# Patient Record
Sex: Female | Born: 1935 | Race: White | Hispanic: No | Smoking: Never smoker
Health system: Southern US, Community
[De-identification: ages and names within clinical notes are randomized; demographics above are authoritative.]

## PROBLEM LIST (undated history)

## (undated) DIAGNOSIS — K8020 Calculus of gallbladder without cholecystitis without obstruction: Secondary | ICD-10-CM

## (undated) DIAGNOSIS — K859 Acute pancreatitis without necrosis or infection, unspecified: Secondary | ICD-10-CM

## (undated) DIAGNOSIS — I495 Sick sinus syndrome: Secondary | ICD-10-CM

## (undated) DIAGNOSIS — J45909 Unspecified asthma, uncomplicated: Secondary | ICD-10-CM

## (undated) DIAGNOSIS — Z950 Presence of cardiac pacemaker: Secondary | ICD-10-CM

## (undated) DIAGNOSIS — Z860100 Personal history of colon polyps, unspecified: Secondary | ICD-10-CM

## (undated) DIAGNOSIS — R55 Syncope and collapse: Secondary | ICD-10-CM

## (undated) DIAGNOSIS — G8929 Other chronic pain: Secondary | ICD-10-CM

## (undated) DIAGNOSIS — I493 Ventricular premature depolarization: Secondary | ICD-10-CM

## (undated) DIAGNOSIS — M159 Polyosteoarthritis, unspecified: Secondary | ICD-10-CM

## (undated) DIAGNOSIS — E785 Hyperlipidemia, unspecified: Secondary | ICD-10-CM

## (undated) DIAGNOSIS — K529 Noninfective gastroenteritis and colitis, unspecified: Secondary | ICD-10-CM

## (undated) DIAGNOSIS — I10 Essential (primary) hypertension: Secondary | ICD-10-CM

## (undated) DIAGNOSIS — I951 Orthostatic hypotension: Secondary | ICD-10-CM

## (undated) DIAGNOSIS — Z7901 Long term (current) use of anticoagulants: Secondary | ICD-10-CM

## (undated) DIAGNOSIS — D509 Iron deficiency anemia, unspecified: Secondary | ICD-10-CM

## (undated) DIAGNOSIS — I4821 Permanent atrial fibrillation: Secondary | ICD-10-CM

## (undated) DIAGNOSIS — M545 Low back pain: Secondary | ICD-10-CM

## (undated) HISTORY — DX: Personal history of colonic polyps: Z86.010

## (undated) HISTORY — DX: Sick sinus syndrome: I49.5

## (undated) HISTORY — PX: APPENDECTOMY: SHX54

## (undated) HISTORY — DX: Long term (current) use of anticoagulants: Z79.01

## (undated) HISTORY — DX: Unspecified asthma, uncomplicated: J45.909

## (undated) HISTORY — DX: Hyperlipidemia, unspecified: E78.5

## (undated) HISTORY — PX: ABDOMINAL HYSTERECTOMY: SHX81

## (undated) HISTORY — DX: Orthostatic hypotension: I95.1

## (undated) HISTORY — PX: COLONOSCOPY: SHX174

## (undated) HISTORY — DX: Calculus of gallbladder without cholecystitis without obstruction: K80.20

## (undated) HISTORY — DX: Ventricular premature depolarization: I49.3

## (undated) HISTORY — DX: Iron deficiency anemia, unspecified: D50.9

## (undated) HISTORY — DX: Permanent atrial fibrillation: I48.21

## (undated) HISTORY — DX: Polyosteoarthritis, unspecified: M15.9

## (undated) HISTORY — DX: Syncope and collapse: R55

## (undated) HISTORY — DX: Essential (primary) hypertension: I10

## (undated) HISTORY — DX: Personal history of colon polyps, unspecified: Z86.0100

## (undated) SURGERY — Surgical Case
Anesthesia: *Unknown

---

## 1979-01-08 HISTORY — PX: VESICOVAGINAL FISTULA CLOSURE W/ TAH: SUR271

## 1997-08-30 ENCOUNTER — Inpatient Hospital Stay (HOSPITAL_COMMUNITY): Admission: EM | Admit: 1997-08-30 | Discharge: 1997-09-02 | Payer: Self-pay | Admitting: Emergency Medicine

## 1997-09-06 ENCOUNTER — Ambulatory Visit (HOSPITAL_COMMUNITY): Admission: RE | Admit: 1997-09-06 | Discharge: 1997-09-06 | Payer: Self-pay | Admitting: Cardiology

## 2003-05-15 ENCOUNTER — Emergency Department (HOSPITAL_COMMUNITY): Admission: EM | Admit: 2003-05-15 | Discharge: 2003-05-16 | Payer: Self-pay | Admitting: Emergency Medicine

## 2005-07-05 ENCOUNTER — Encounter: Admission: RE | Admit: 2005-07-05 | Discharge: 2005-07-05 | Payer: Self-pay | Admitting: Family Medicine

## 2006-01-07 DIAGNOSIS — G8929 Other chronic pain: Secondary | ICD-10-CM

## 2006-01-07 DIAGNOSIS — M545 Low back pain, unspecified: Secondary | ICD-10-CM

## 2006-01-07 HISTORY — DX: Low back pain, unspecified: M54.50

## 2006-01-07 HISTORY — DX: Other chronic pain: G89.29

## 2006-07-22 ENCOUNTER — Encounter: Admission: RE | Admit: 2006-07-22 | Discharge: 2006-07-22 | Payer: Self-pay | Admitting: Family Medicine

## 2006-08-11 ENCOUNTER — Ambulatory Visit (HOSPITAL_COMMUNITY): Admission: RE | Admit: 2006-08-11 | Discharge: 2006-08-12 | Payer: Self-pay | Admitting: Cardiology

## 2006-09-29 ENCOUNTER — Inpatient Hospital Stay (HOSPITAL_COMMUNITY): Admission: AD | Admit: 2006-09-29 | Discharge: 2006-10-01 | Payer: Self-pay | Admitting: Cardiology

## 2006-10-11 DIAGNOSIS — Z950 Presence of cardiac pacemaker: Secondary | ICD-10-CM

## 2006-10-11 HISTORY — DX: Presence of cardiac pacemaker: Z95.0

## 2006-10-11 HISTORY — PX: PACEMAKER INSERTION: SHX728

## 2007-01-08 HISTORY — PX: CARDIAC CATHETERIZATION: SHX172

## 2007-01-20 ENCOUNTER — Encounter: Admission: RE | Admit: 2007-01-20 | Discharge: 2007-01-20 | Payer: Self-pay | Admitting: Cardiology

## 2007-01-30 ENCOUNTER — Encounter: Payer: Self-pay | Admitting: Critical Care Medicine

## 2007-02-02 ENCOUNTER — Encounter: Payer: Self-pay | Admitting: Critical Care Medicine

## 2007-02-02 ENCOUNTER — Encounter: Admission: RE | Admit: 2007-02-02 | Discharge: 2007-02-02 | Payer: Self-pay | Admitting: Cardiology

## 2007-06-18 ENCOUNTER — Encounter: Payer: Self-pay | Admitting: Critical Care Medicine

## 2007-06-18 ENCOUNTER — Encounter: Admission: RE | Admit: 2007-06-18 | Discharge: 2007-06-18 | Payer: Self-pay | Admitting: Cardiology

## 2007-06-22 ENCOUNTER — Inpatient Hospital Stay (HOSPITAL_BASED_OUTPATIENT_CLINIC_OR_DEPARTMENT_OTHER): Admission: RE | Admit: 2007-06-22 | Discharge: 2007-06-22 | Payer: Self-pay | Admitting: Cardiology

## 2007-07-06 ENCOUNTER — Encounter: Payer: Self-pay | Admitting: Critical Care Medicine

## 2007-07-09 ENCOUNTER — Ambulatory Visit: Payer: Self-pay | Admitting: Critical Care Medicine

## 2007-07-09 ENCOUNTER — Ambulatory Visit: Payer: Self-pay | Admitting: Cardiology

## 2007-07-09 DIAGNOSIS — R55 Syncope and collapse: Secondary | ICD-10-CM | POA: Insufficient documentation

## 2007-07-09 DIAGNOSIS — I495 Sick sinus syndrome: Secondary | ICD-10-CM | POA: Insufficient documentation

## 2007-07-09 DIAGNOSIS — E785 Hyperlipidemia, unspecified: Secondary | ICD-10-CM | POA: Insufficient documentation

## 2007-07-09 DIAGNOSIS — I482 Chronic atrial fibrillation, unspecified: Secondary | ICD-10-CM | POA: Insufficient documentation

## 2007-07-09 DIAGNOSIS — I10 Essential (primary) hypertension: Secondary | ICD-10-CM | POA: Insufficient documentation

## 2007-07-12 ENCOUNTER — Encounter: Payer: Self-pay | Admitting: Critical Care Medicine

## 2007-07-12 LAB — CONVERTED CEMR LAB
GFR calc Af Amer: 69 mL/min
Glucose, Bld: 120 mg/dL — ABNORMAL HIGH (ref 70–99)
Potassium: 4.5 meq/L (ref 3.5–5.1)
Sodium: 141 meq/L (ref 135–145)

## 2007-07-16 ENCOUNTER — Ambulatory Visit: Admission: RE | Admit: 2007-07-16 | Discharge: 2007-07-16 | Payer: Self-pay | Admitting: Critical Care Medicine

## 2007-07-16 ENCOUNTER — Encounter: Payer: Self-pay | Admitting: Critical Care Medicine

## 2007-07-17 ENCOUNTER — Encounter: Payer: Self-pay | Admitting: Critical Care Medicine

## 2007-07-20 ENCOUNTER — Telehealth (INDEPENDENT_AMBULATORY_CARE_PROVIDER_SITE_OTHER): Payer: Self-pay | Admitting: *Deleted

## 2007-08-19 ENCOUNTER — Ambulatory Visit: Payer: Self-pay | Admitting: Critical Care Medicine

## 2007-09-17 ENCOUNTER — Encounter: Payer: Self-pay | Admitting: Critical Care Medicine

## 2007-09-18 ENCOUNTER — Encounter: Payer: Self-pay | Admitting: Critical Care Medicine

## 2007-10-09 ENCOUNTER — Ambulatory Visit: Payer: Self-pay | Admitting: Critical Care Medicine

## 2007-12-28 ENCOUNTER — Ambulatory Visit (HOSPITAL_COMMUNITY): Admission: RE | Admit: 2007-12-28 | Discharge: 2007-12-28 | Payer: Self-pay | Admitting: Orthopedic Surgery

## 2008-01-02 ENCOUNTER — Ambulatory Visit: Payer: Self-pay | Admitting: Vascular Surgery

## 2008-01-02 ENCOUNTER — Encounter (INDEPENDENT_AMBULATORY_CARE_PROVIDER_SITE_OTHER): Payer: Self-pay | Admitting: Emergency Medicine

## 2008-01-02 ENCOUNTER — Inpatient Hospital Stay (HOSPITAL_COMMUNITY): Admission: EM | Admit: 2008-01-02 | Discharge: 2008-01-08 | Payer: Self-pay | Admitting: Emergency Medicine

## 2008-01-06 ENCOUNTER — Encounter (INDEPENDENT_AMBULATORY_CARE_PROVIDER_SITE_OTHER): Payer: Self-pay | Admitting: Interventional Radiology

## 2008-01-26 ENCOUNTER — Encounter: Payer: Self-pay | Admitting: Interventional Radiology

## 2008-01-27 ENCOUNTER — Ambulatory Visit (HOSPITAL_COMMUNITY): Admission: RE | Admit: 2008-01-27 | Discharge: 2008-01-27 | Payer: Self-pay | Admitting: Interventional Radiology

## 2008-01-29 ENCOUNTER — Ambulatory Visit (HOSPITAL_COMMUNITY): Admission: RE | Admit: 2008-01-29 | Discharge: 2008-01-29 | Payer: Self-pay | Admitting: Interventional Radiology

## 2008-02-19 ENCOUNTER — Ambulatory Visit (HOSPITAL_COMMUNITY): Admission: RE | Admit: 2008-02-19 | Discharge: 2008-02-19 | Payer: Self-pay | Admitting: Cardiology

## 2008-04-25 ENCOUNTER — Ambulatory Visit: Payer: Self-pay | Admitting: Critical Care Medicine

## 2008-06-13 ENCOUNTER — Ambulatory Visit: Payer: Self-pay | Admitting: Critical Care Medicine

## 2008-08-15 ENCOUNTER — Encounter: Admission: RE | Admit: 2008-08-15 | Discharge: 2008-08-15 | Payer: Self-pay | Admitting: Specialist

## 2009-02-23 ENCOUNTER — Telehealth: Payer: Self-pay | Admitting: Critical Care Medicine

## 2009-02-24 ENCOUNTER — Ambulatory Visit: Payer: Self-pay | Admitting: Critical Care Medicine

## 2009-02-27 DIAGNOSIS — J45909 Unspecified asthma, uncomplicated: Secondary | ICD-10-CM | POA: Insufficient documentation

## 2009-02-28 ENCOUNTER — Encounter: Payer: Self-pay | Admitting: Critical Care Medicine

## 2009-03-02 ENCOUNTER — Telehealth (INDEPENDENT_AMBULATORY_CARE_PROVIDER_SITE_OTHER): Payer: Self-pay | Admitting: *Deleted

## 2009-04-18 ENCOUNTER — Encounter: Admission: RE | Admit: 2009-04-18 | Discharge: 2009-04-18 | Payer: Self-pay | Admitting: Gastroenterology

## 2009-05-08 ENCOUNTER — Ambulatory Visit (HOSPITAL_COMMUNITY): Admission: RE | Admit: 2009-05-08 | Discharge: 2009-05-08 | Payer: Self-pay | Admitting: Gastroenterology

## 2009-10-30 ENCOUNTER — Encounter: Admission: RE | Admit: 2009-10-30 | Discharge: 2009-10-30 | Payer: Self-pay | Admitting: Orthopaedic Surgery

## 2010-01-28 ENCOUNTER — Encounter: Payer: Self-pay | Admitting: Orthopedic Surgery

## 2010-01-28 ENCOUNTER — Encounter: Payer: Self-pay | Admitting: Gastroenterology

## 2010-02-06 NOTE — Medication Information (Signed)
Summary: Clarification for Serevent Diskus/[facility]  Clarification for Serevent [facility]   Imported By: Sherian Rein 03/08/2009 11:12:09  _____________________________________________________________________  External Attachment:    Type:   Image     Comment:   External Document

## 2010-02-06 NOTE — Progress Notes (Signed)
Summary: Foradil changed to Serevent  Phone Note From Pharmacy Call back at [phone]   Caller: Patient Caller: [facility]  Call For: WRIGHT  Summary of Call: [facility] NOT AVAILABLE NEED ALTERNATIVE INVOICE # IS [id number] Initial call taken by: Rickard Patience,  March 02, 2009 3:28 PM  Follow-up for Phone Call        foradil is on backorder indefinetly please advise of alternative medication .Chantel Bowne  March 02, 2009 3:48 PM  Additional Follow-up for Phone Call Additional follow up Details #1::        start Serevent diskus  one puff two times a day  Additional Follow-up by: Storm Frisk MD,  March 02, 2009 5:51 PM    Additional Follow-up for Phone Call Additional follow up Details #2::    Called [facility] and gave verbal okay to change Foradil to Serevent. I also called the patient and she is aware of the change and that [facility] will be shipping her new medication to her. She will call if she has any problems or questions regarding the Serevent. Foradil changed to Serevent on pt's medication list. Follow-up by: Michel Bickers CMA,  March 03, 2009 9:08 AM  New/Updated Medications: SEREVENT DISKUS 50 MCG/DOSE AEPB (SALMETEROL XINAFOATE) 1 puff two times a day Prescriptions: SEREVENT DISKUS 50 MCG/DOSE AEPB (SALMETEROL XINAFOATE) 1 puff two times a day  #3 x 3   Entered by:   Michel Bickers CMA   Authorized by:   Storm Frisk MD   Signed by:   Michel Bickers CMA on 03/03/2009   Method used:   Telephoned to ...       [facility]* (mail-order)             ,          Ph: [phone]       Fax: [phone]   RxID:   [id number]

## 2010-02-06 NOTE — Progress Notes (Signed)
Summary: refills/ [facility]  Phone Note Call from Patient Call back at Home Phone [phone]   Caller: Patient Call For: wright Summary of Call: pt wants FORADIL and QVAR refilled through [facility].  Initial call taken by: Tivis Ringer, CNA,  February 23, 2009 11:50 AM  Follow-up for Phone Call        advised pt she was overdue for follow-up, so I schedueld her to see PW at 9:50 tomorrow. Sent in refills. Carron Curie CMA  February 23, 2009 12:00 PM     Prescriptions: QVAR 80 MCG/ACT  AERS (BECLOMETHASONE DIPROPIONATE) Two  puffs twice daily Brand medically necessary #3 x 3   Entered by:   Carron Curie CMA   Authorized by:   Storm Frisk MD   Signed by:   Carron Curie CMA on 02/23/2009   Method used:   Faxed to ...       [facility]* (mail-order)             ,          Ph: [phone]       Fax: [phone]   RxID:   [id number] FORADIL AEROLIZER 12 MCG  CAPS (FORMOTEROL FUMARATE) One  capsule (1 puff) twice daily Brand medically necessary #180 x 3   Entered by:   Carron Curie CMA   Authorized by:   Storm Frisk MD   Signed by:   Carron Curie CMA on 02/23/2009   Method used:   Faxed to ...       [facility]* (mail-order)             ,          Ph: [phone]       Fax: [phone]   RxID:   [id number]

## 2010-02-06 NOTE — Assessment & Plan Note (Signed)
Summary: Pulmonary OV   Copy to:  Dr. Rolin Barry Primary Provider/Referring Provider:  Arvilla Market  CC:  Followup asthma.  Pt states that her breathing has improved.  Pt c/o "empty feeling" in chest on the left side- bothers her in the am "not painful".  No other complaints today.Anna Farrell  History of Present Illness: Pulmonary OV: 77 yo WF with moderate persistent asthma   February 24, 2009 9:53 AM Dyspnea is better.  No real cough.  No wheeze .  Is still taking symbicort and foradil and qvar!! Pt denies any significant sore throat, nasal congestion or excess secretions, fever, chills, sweats, unintended weight loss, pleurtic or exertional chest pain, orthopnea PND, or leg swelling Pt denies any increase in rescue therapy over baseline, denies waking up needing it or having any early am or nocturnal exacerbations of coughing/wheezing/or dyspnea. February 24, 2009 9:53 AM Dyspnea is better.  No real cough.  No wheeze .  Is still taking symbicort and foradil and qvar!!  Asthma History    Initial Asthma Severity Rating:    Age range: 12+ years    Symptoms: 0-2 days/week    Nighttime Awakenings: 0-2/month    Interferes w/ normal activity: no limitations    SABA use (not for EIB): 0-2 days/week    Exacerbations requiring oral systemic steroids: 0-1/year    Asthma Severity Assessment: Intermittent   Preventive Screening-Counseling & Management  Alcohol-Tobacco     Smoking Status: never  Current Medications (verified): 1)  Crestor 5 Mg  Tabs (Rosuvastatin Calcium) .Anna Farrell.. 1 By Mouth Daily 2)  Toprol Xl 50 Mg  Tb24 (Metoprolol Succinate) .Anna Farrell.. 1 By Mouth Daily 3)  Coumadin 3 Mg  Tabs (Warfarin Sodium) .... Take As Directed 4)  Amiodarone Hcl 200 Mg  Tabs (Amiodarone Hcl) .Anna Farrell.. 1 1/2 Once Daily 5)  Tylenol Extra Strength 500 Mg  Tabs (Acetaminophen) .... As Needed 6)  Vitamin D 1000 Unit  Tabs (Cholecalciferol) .Anna Farrell.. 1 By Mouth Daily 7)  Foradil Aerolizer 12 Mcg  Caps (Formoterol Fumarate) ....  One  Capsule (1 Puff) Twice Daily 8)  Singulair 10 Mg Tabs (Montelukast Sodium) .Anna Farrell.. 1 By Mouth Daily 9)  Qvar 80 Mcg/act  Aers (Beclomethasone Dipropionate) .... Two  Puffs Twice Daily  Allergies (verified): 1)  ! Codeine  Past History:  Past medical, surgical, family and social histories (including risk factors) reviewed, and no changes noted (except as noted below).  Past Medical History: Reviewed history from 10/09/2007 and no changes required. Current Problems:  BRADYCARDIA-TACHYCARDIA SYNDROME (ICD-427.81) PPM placed 8/08 ATRIAL FIBRILLATION (ICD-427.31) VASOVAGAL SYNCOPE (ICD-780.2) HYPERTENSION (ICD-401.9) DYSLIPIDEMIA (ICD-272.4) orhostatic hypotension Asthma 2009     -FEV1     Value: 1.42 L     Pred: 1.87 L     % Pred: 76 %->75%    FEF 25-75   Value: 0.68 L/min   Pred: 1.64 L/min     % Pred: 42 %-->35%  Past Surgical History: Reviewed history from 07/09/2007 and no changes required. Hysterectomy in 1981 Pacemaker insertion on 10/11/06  Past Pulmonary History:  Pulmonary History: Asthma PFTs:  2009     -FEV1     Value: 1.42 L     Pred: 1.87 L     % Pred: 76 %->75%    FEF 25-75   Value: 0.68 L/min   Pred: 1.64 L/min     % Pred: 42 %-->35%  Family History: Reviewed history from 07/09/2007 and no changes required. non contrib  Social History: Reviewed history from 07/09/2007 and  no changes required. Patient never smoked.  waitress at [employer]  Review of Systems       The patient complains of shortness of breath with activity.  The patient denies shortness of breath at rest, productive cough, non-productive cough, coughing up blood, chest pain, irregular heartbeats, acid heartburn, indigestion, loss of appetite, weight change, abdominal pain, difficulty swallowing, sore throat, tooth/dental problems, headaches, nasal congestion/difficulty breathing through nose, sneezing, itching, ear ache, anxiety, depression, hand/feet swelling, joint stiffness or pain, rash,  change in color of mucus, and fever.    Vital Signs:  Patient profile:   77 year old female Weight:      154 pounds BMI:     28.27 O2 Sat:      97 % on Room air Temp:     98.0 degrees F oral Pulse rate:   69 / minute BP sitting:   150 / 96  (left arm)  Vitals Entered By: Vernie Murders (February 24, 2009 9:49 AM)  O2 Flow:  Room air  Physical Exam  Additional Exam:  Gen: Pleasant, well-nourished, in no distress , normal affect ENT: no lesions, no post nasal drip Neck: No JVD, no TMG, no carotid bruits Lungs: No use of accessory muscles, no dullness to percussion, distant BS,  poor airflow Cardiovascular: RRR, heart sounds normal, no murmurs or gallops, no peripheral edema Abdomen: soft and non-tender, no HSM, BS normal Musculoskeletal: No deformities, no cyanosis or clubbing Neuro: alert, non-focal     Impression & Recommendations:  Problem # 1:  EXTRINSIC ASTHMA, UNSPECIFIED (ICD-493.00) Assessment Unchanged stable moderate persistent asthma plan Stop symbicort Stay on Foradil on capsule twice daily Stay on Qvar two puffs twice a day No changes otherwise Return in 4 months  Medications Added to Medication List This Visit: 1)  Amiodarone Hcl 200 Mg Tabs (Amiodarone hcl) .Anna Farrell.. 1 1/2 once daily  Complete Medication List: 1)  Crestor 5 Mg Tabs (Rosuvastatin calcium) .Anna Farrell.. 1 by mouth daily 2)  Toprol Xl 50 Mg Tb24 (Metoprolol succinate) .Anna Farrell.. 1 by mouth daily 3)  Coumadin 3 Mg Tabs (Warfarin sodium) .... Take as directed 4)  Amiodarone Hcl 200 Mg Tabs (Amiodarone hcl) .Anna Farrell.. 1 1/2 once daily 5)  Tylenol Extra Strength 500 Mg Tabs (Acetaminophen) .... As needed 6)  Vitamin D 1000 Unit Tabs (Cholecalciferol) .Anna Farrell.. 1 by mouth daily 7)  Foradil Aerolizer 12 Mcg Caps (Formoterol fumarate) .... One  capsule (1 puff) twice daily 8)  Singulair 10 Mg Tabs (Montelukast sodium) .Anna Farrell.. 1 by mouth daily 9)  Qvar 80 Mcg/act Aers (Beclomethasone dipropionate) .... Two  puffs twice  daily  Other Orders: Est. Patient Level III (16109)  Patient Instructions: 1)  Stop symbicort 2)  Stay on Foradil on capsule twice daily 3)  Stay on Qvar two puffs twice a day 4)  No changes otherwise 5)  Return in 4 months

## 2010-04-17 ENCOUNTER — Other Ambulatory Visit: Payer: Self-pay | Admitting: Critical Care Medicine

## 2010-04-17 DIAGNOSIS — J449 Chronic obstructive pulmonary disease, unspecified: Secondary | ICD-10-CM

## 2010-04-17 NOTE — Telephone Encounter (Signed)
i approved the refill x one month,  The pt needs to make an appt for f/u OV

## 2010-04-23 LAB — COMPREHENSIVE METABOLIC PANEL
ALT: 28 U/L (ref 0–35)
Albumin: 2.5 g/dL — ABNORMAL LOW (ref 3.5–5.2)
Alkaline Phosphatase: 91 U/L (ref 39–117)
Chloride: 105 mEq/L (ref 96–112)
Glucose, Bld: 102 mg/dL — ABNORMAL HIGH (ref 70–99)
Potassium: 4 mEq/L (ref 3.5–5.1)
Sodium: 140 mEq/L (ref 135–145)
Total Bilirubin: 0.6 mg/dL (ref 0.3–1.2)
Total Protein: 5.4 g/dL — ABNORMAL LOW (ref 6.0–8.3)

## 2010-04-23 LAB — CBC
HCT: 35.2 % — ABNORMAL LOW (ref 36.0–46.0)
Hemoglobin: 11.6 g/dL — ABNORMAL LOW (ref 12.0–15.0)
Platelets: 237 10*3/uL (ref 150–400)
RDW: 15.2 % (ref 11.5–15.5)
WBC: 4.8 10*3/uL (ref 4.0–10.5)

## 2010-05-22 NOTE — Discharge Summary (Signed)
[name], [name]                 ACCOUNT NO.:  [id number]   MEDICAL RECORD NO.:  [id number]          PATIENT TYPE:  OIB   LOCATION:  6522                         FACILITY:  [facility]   PHYSICIAN:  Francisca December, M.D.  DATE OF BIRTH:  [birth year]   DATE OF ADMISSION:  08/11/2006  DATE OF DISCHARGE:  08/12/2006                               DISCHARGE SUMMARY   DISCHARGE DIAGNOSES:  1. Tachybrady syndrome status post dual-chamber pacemaker, St. Jude      type, on August 11, 2006.  2. Atrial fibrillation.  3. Dyslipidemia.  4. Hypertension.  5. History of vasovagal syncope.  6. Long-term medication use.  7. Coumadin use.   Anna Farrell is a 77 year old female who has a history of atrial  fibrillation as well as sinus bradycardia despite now being on beta  blockers.  Ultimately, she required permanent problem for this  tachybrady syndrome.  On August 11, 2006, she was brought into the  hospital, underwent a dual-chamber St. Jude pacemaker.  She tolerated  the procedure well and remained in the hospital overnight and was ready  for discharge home the following day.   Please note that a recent stress Cardiolite was performed, which showed  no evidence of underlying inducible ischemia.   DISCHARGE MEDICATIONS:  1. Avalide 300/25 mg 1 tablet daily.  2. Crestor 5 mg daily.  3. Allegra 180 mg p.r.n.  4. Excedrin as needed.  5. Vicodin 1-2 tablets every 6 hours as needed for pain.  6. Coumadin 3 mg 1 twice daily.  7. Toprol 100 mg 1/2 tablet daily.   Clean site gently with soap and water, no scrubbing.  Instruction sheet  regarding activity and wound care was given to the patient.  She is to  remain on a low sodium, heart-healthy diet.      Anna Farrell, P.A.      Francisca December, M.D.  Electronically Signed    LB/MEDQ  D:  08/12/2006  T:  08/12/2006  Job:  [id number]   cc:   Dr. Threasa Heads, M.D.

## 2010-05-22 NOTE — Op Note (Signed)
[name], [name]                 ACCOUNT NO.:  [id number]   MEDICAL RECORD NO.:  [id number]          PATIENT TYPE:  OIB   LOCATION:  2854                         FACILITY:  [facility]   PHYSICIAN:  Francisca December, M.D.  DATE OF BIRTH:  18-Apr-1927   DATE OF PROCEDURE:  08/11/2006  DATE OF DISCHARGE:                               OPERATIVE REPORT   PROCEDURES PERFORMED:  1. Insertion dual-chamber permanent transvenous pacemaker.  2. Left subclavian venogram.   INDICATION:  Anna Farrell is a 77 year old woman with paroxysmal atrial  fibrillation and apparent tachybrady syndrome.  A recent Holter monitor  revealed 4.1-second pauses.  She is brought to catheterization  laboratory at this time for implantation of dual-chamber permanent  transvenous pacemaker.   PROCEDURE NOTE:  The patient is brought to cardiac catheterization  laboratory in fasting state.  The left prepectoral region was prepped  and draped in the usual sterile fashion.  Local anesthesia was obtained  with infiltration of 1% lidocaine.  A 6-7 cm incision was then made in  the deltopectoral groove and this was carried down by sharp dissection  and electrocautery to the prepectoral fascia.  There a plane was lifted  and a pocket formed inferiorly and medially.  It was then packed with 1%  kanamycin soaked gauze.   A left subclavian venogram was then performed with a peripheral  injection of 20 mL 20 mL of Omnipaque.  A digital cine angiogram was  obtained and road mapped to guide future left subclavian puncture.  The venogram did demonstrate the vein to be widely patent and coursing  in a normal fashion over the anterior surface of the first rib and  beneath the middle third of the clavicle.  There was no evidence for  persistence of the left superior vena cava.   Two separate left subclavian punctures were then performed with an 18-  gauge thin-wall needle through which a 0.038 inches guidewire.  Over the  initial  guidewire a 7-French tearaway sheath and dilator were advanced.  The dilator and wire were removed and the ventricular lead was advanced  to the level of the right atrium.  The sheath was torn away.  Using  standard technique and fluoroscopic landmarks the lead was manipulated  into the right ventricular apex.  This was an active fixation lead and  the screw was advanced as appropriate.  It was tested for adequate  pacing parameters and these are reported below.  It was tested for  diaphragmatic pacing at 10 volts and none was found.  The lead was then  sutured into place using three separate silk ligatures.  Over the  remaining guidewire a second 7-French tearaway sheath and dilator were  advanced.  Again the dilator and wire were removed and the atrial lead  was advanced to the level of the right atrium.  The sheath was torn  away.  Again using standard technique and fluoroscopic landmarks the  lead was manipulated into the right atrial appendage.  This again was an  active fixation lead and the screw was advanced as appropriate.  It was  tested for adequate pacing parameters and these are reported below.  Was  also tested for diaphragmatic pacing at 10 volts and none was found.  The lead was then sutured into place using three separate 0 silk  ligatures.  The kanamycin soaked gauze was then removed from the pocket.  The pocket was copiously irrigated using 1% kanamycin solution.  The  leads were then attached to the pacing generator carefully identifying  each by its serial number placing each into the appropriate receptacle.  Each lead was tightened into place and tested for security.  The leads  then wound beneath the pacing generator and the generator was placed in  the pocket.  A 0 silk anchoring suture was applied.  The subcutaneous  layer was then approximated using 2-0 Vicryl in a running fashion.  The  skin was approximated using 4-0 Vicryl in a running subcuticular  fashion.   Steri-Strips and sterile dressing were applied.  The patient  is transported to recovery area in stable condition.   EQUIPMENT DATA:  The pacing generator is a St. Jude Zephyr XL DR model  number [id number], serial number [id number].  The atrial lead is a St. Jude model  number a [id number] serial number [id number].  The ventricular lead is a  Medtronic model number [id number] serial number [id number].   PACING DATA:  The atrial lead detected a 1.7 mV P-wave.  The pacing  threshold was 0.8 volts at 0.5 milliseconds pulse width.  The impedance  was 610 ohms resulting in a current at capture threshold of 1.8 MA.  The  ventricular lead detected 8.4 mV R wave.  The pacing threshold was 0.5  volts at 0.5 milliseconds pulse width.  The impedance was 563 ohms  resulting in a current at capture threshold of 0.8 MA.      Francisca December, M.D.  Electronically Signed     JHE/MEDQ  D:  08/11/2006  T:  08/12/2006  Job:  [id number]

## 2010-05-22 NOTE — H&P (Signed)
NAMETRENIECE, HOLSCLAW NO.:  [id number]   MEDICAL RECORD NO.:  [id number]          PATIENT TYPE:  INP   LOCATION:  1824                         FACILITY:  [facility]   PHYSICIAN:  Ramiro Harvest, MD    DATE OF BIRTH:  01-17-1927   DATE OF ADMISSION:  01/02/2008  DATE OF DISCHARGE:                              HISTORY & PHYSICAL   PRIMARY CARE PHYSICIAN:  Donia Guiles, MD, of [facility]   ORTHOPEDIST:  Lenard Galloway. Chaney Malling, MD   CARDIOLOGIST:  Armanda Magic, MD, of [facility]   HISTORY OF PRESENT ILLNESS:  Anna Farrell is a 77 year old white female  with history of atrial fibrillation, tachybrady syndrome, status post  pacemaker, hypertension, hyperlipidemia who presented to the ED with  intractable lower back pain and inability to move per patient and  family.  Per patient and family, patient fell on the ice on Monday,  December 28, 2007, went to see orthopedist who got an x-ray, and it was  found patient had a compression fracture of L1.  A CT scan was done  which was seen consistent with a compression fracture.  Patient was  given some pain medications and told to get some bed rest at her home.  Patient was scheduled per orthopedist per family to get a bone scan done  on January 05, 2008, as well as a referral to interventional radiology.  Patient, however, stated that she woke up on Thursday, December 31, 2007, to go to the restroom around 4:30 a.m. and at 6:30 a.m. was in  such intractable pain that she was unable to move and called her family.  The patient has had some decreased p.o. intake as well.  Patient has  some generalized weakness, suprapubic pain as well.  Patient denied any  fever, no chills, no emesis, no diarrhea, no constipation, no chest  pain, some shortness of breath which has since resolved.  Patient denies  any urinary or bowel incontinence, no focal neurological symptoms, no  tingling, no numbness.  Patient does endorse bilateral  lower back pain.  Doppler ultrasounds done were negative.  INR obtained was 5.  Ortho was  called and stated would consult on the patient, and we were called to  admit the patient for further evaluation and management.   ALLERGIES:  No known drug allergies.   PAST MEDICAL HISTORY:  1. Tachybrady syndrome, status post dual-chamber pacemaker, St. Jude      type, August 11, 2006, per Dr. Amil Amen.  2. Atrial fibrillation.  3. Dyslipidemia.  4. Hypertension.  5. History of vasovagal syncope.  6. Coumadin use.  7. Mild diastolic dysfunction.  8. History of asthma.   HOME MEDICATIONS:  1. Crestor 5 mg p.o. daily.  2. Toprol-XL 50 mg p.o. daily.  3. Amiodarone 200 mg p.o. daily.  4. Coumadin 3 mg daily except for Mondays when she takes 1.5 mg.  5. Hydrocodone p.r.n.  6. Symbicort 2 puffs b.i.d.  7. Astepro nasal spray as needed.  8. Tylenol as needed.  9. Robaxin.   SOCIAL HISTORY:  The patient lives in  [place], is widowed, no tobacco  use, no alcohol use, no IV drug use.   FAMILY HISTORY:  Noncontributory.   REVIEW OF SYSTEMS:  As per HPI, otherwise negative.   PHYSICAL EXAMINATION:  VITAL SIGNS:  Temperature 99.9, blood pressure  113/73, pulse of 80, respiratory rate 20, saturating 93% on room air.  GENERAL:  Patient is lying on bed in moderate distress.  HEENT:  Normocephalic, atraumatic.  Pupils are equal, round, reactive to  light and accommodation.  Extraocular movements are intact.  Oropharynx  is clear, no lesions, no exudates, dry mucous membranes.  NECK:  Supple, no lymphadenopathy.  RESPIRATORY:  Lungs are clear to auscultation bilaterally in anterior  lung fields.  CARDIOVASCULAR:  Regular rate and rhythm, no murmurs, rubs, or gallops.  ABDOMEN:  Soft, nondistended, positive suprapubic tenderness to  palpation, positive bowel sounds.  EXTREMITIES:  No clubbing, cyanosis or edema.  NEUROLOGICAL:  Patient is alert and oriented x3.  Cranial nerves II-XII  are  grossly intact.  No focal deficits.  Unable to elicit reflexes  diffusely.  Sensation is intact.  Cerebellum is intact.  There is 3/5 to  4/5 bilateral lower extremity strength, 5/5 bilateral upper extremity  strength.   LABORATORIES:  PT of 50.8, INR 5.  X-ray of bilateral hips was normal.  CT scan of the L spine shows superior end plate compression fracture  with fracture line extending to the posterior wall of the vertebral  body.  Approximately 25% loss of height and minimal retropulsion of the  bone without significant stenosis.  Mild x-ray convex curvature of the  lumbar spine at multilevels, spondylosis in the lower lumbar spine per  CT scan of December 28, 2007.   ASSESSMENT/PLAN:  Ms. Anna Farrell is a 77 year old female with history  of tachybrady syndrome, status post pacemaker, history of atrial  fibrillation who fell on the ice on Monday, December 28, 2007,  sustaining a compression fracture coming in with intractable pain and  inability to move.  1. L1 compression fracture secondary to mechanical fall.  Patient with      known neurological deficits; patient in intractable pain, unable to      move secondary to this.  Admit to a regular bed.  Tylenol 650 mg      p.o. q.8 h., oxycodone 5 mg p.o. q.4 h. as needed for breakthrough      pain, morphine also as needed for breakthrough pain; place on      intravenous fluids, symptomatic care.  Consult interventional      radiology for possible vertebroplasty.  Discussed that with Dr.      Corliss Skains.  2. Supratherapeutic INR.  No evidence of overt bleeding.  Will hold      patient's Coumadin for now and monitor INR.  3. Dehydration.  Place on intravenous fluids.  4. Suprapubic pain, likely secondary to a urinary tract infection.  We      will check a UA with cultures and sensitivities, check a      comprehensive metabolic profile, check CBC with differential, check      a magnesium level, symptomatic care.  5. Atrial fibrillation  with rapid ventricular response.  Amiodarone      and Toprol for rate control.  Patient with a supratherapeutic INR;      hold Coumadin.  6. Hypertension.  Toprol-XL.  7. Hyperlipidemia.  Crestor.  8. Prophylaxis.  Protonix for gastrointestinal prophylaxis.  Coumadin      for deep venous thrombosis  prophylaxis.  However, patient with a      supratherapeutic INR, will monitor for now.   It has been a pleasure taking care of Ms. Anna Farrell.      Ramiro Harvest, MD  Electronically Signed     DT/MEDQ  D:  01/02/2008  T:  01/02/2008  Job:  [id number]   cc:   Donia Guiles, M.D.  Rodney A. Chaney Malling, M.D.  Armanda Magic, M.D.

## 2010-05-22 NOTE — Discharge Summary (Signed)
[name], [name]                 ACCOUNT NO.:  [id number]   MEDICAL RECORD NO.:  [id number]          PATIENT TYPE:  INP   LOCATION:  3001                         FACILITY:  [facility]   PHYSICIAN:  Michiel Cowboy, MDDATE OF BIRTH:  1927-01-08   DATE OF ADMISSION:  01/02/2008  DATE OF DISCHARGE:  01/08/2008                               DISCHARGE SUMMARY   DISCHARGING PHYSICIAN:  Michiel Cowboy, MD   PRIMARY CARE PHYSICIAN:  Donia Guiles, MD   CARDIOLOGY:  Armanda Magic, MD   DISCHARGE DIAGNOSES:  1. L1 fracture, status post vertebroplasty.  2. Urinary tract infection.  3. Deconditioning.  4. Tachybrady syndrome, status post dual-chamber pacemaker, [employer]      type in 2008.  5. Atrial fibrillation.  6. Dyslipidemia.  7. Hypertension.  8. History of vasovagal syncope.  9. Coumadin use.  10.Mild diastolic dysfunction.  11.History of asthma.  12.Labile blood pressure.   CONSULTANTS:  Sanjeev K. Corliss Skains, M.D. of Interventional Radiology.   STUDIES:  The patient had a CT scan of the L-spine done on December 28, 2007, which showed status post bone biopsy of L1 and status post bone  kyphoplasty.  On January 02, 2008, she had bilateral hip x-ray, showed  normal hips.  On January 02, 2008, she had chest portable which showed  cardiomegaly and on January 06, 2008, she had undergone kyphoplasty by  Dr. Julieanne Cotton.   HOSPITAL COURSE:  Please see full details from H&P, but Anna Farrell is a 77-  year-old female who presented with chief complaint of intractable back  pain after a fall and inability to move secondary to severe pain.  The  patient had to be hospitalized for pain control.  Her Coumadin had to be  reversed initially when she presented.  Her INR was severely elevated at  5.  She actually received vitamin K after discussion with Dorene Sorrow  pharmacist from Dr. Norris Cross office who thought that this was  appropriate.  The patient then, once her INR became  subtherapeutic at  1.5, had undergone a kyphoplasty with excellent results at the time of  discharge.  The patient still had mild hip pain but her abdominal pain  has resolved.  She is actually able to ambulate, void, moving all 4  extremities, appears to be neurologically intact.  The patient is to be  discharged to home with home health PT/OT.  Concerning atrial  fibrillation, she remained stable.  The patient is to be discharged on  amiodarone and Toprol and continue her Coumadin at 3 mg daily and have  her INR checked on January 11, 2008.  The results sent to Jerrilyn Cairo in  Dr. Gloris Manchester Turner's office.   Hypertension.  During this hospitalization, the patient has very labile  blood pressure, going up to 190s when she was in pain and down to 90s,  otherwise when she had pain medication.  I decreased her Toprol dose to  25 mg p.o. daily and continued amiodarone.  The patient will need to  have blood pressure check when she presents to primary care Chancie Lampert.  Also, while in the hospital, the patient was noted to have urinary tract  infection, which was treated with Cipro with excellent result.  While in  the hospital, it was noted that the patient's B12 level was slightly  diminished.  We started her on B12.  Because the patient wason coumadin,  we chose not to give her shots but to have her  a pill.  We will need to  have her vitamin B12 checked in about 1 month.   DISCHARGE MEDICATIONS:  1. Continue Coumadin 3 mg p.o. daily, check INR on January 11, 2008.  2. Toprol 25 mg p.o. daily.  3. Crestor 5 mg p.o. daily.  4. Amiodarone 200 mg p.o. daily.  5. Symbicort twice daily.  6. Astepro nasal spray.  7. Vitamin D.  8. Robaxin 500 mg p.o. every 8 hours as needed.  9. Hydrocodone every 4 hours as needed for pain.  10.Phenergan as needed for nausea.  11.Vitamin B12 1000 mcg p.o. daily x3 days every week.  12.Colace 100 mg p.o. b.i.d.      Michiel Cowboy, MD  Electronically  Signed     AVD/MEDQ  D:  01/08/2008  T:  01/09/2008  Job:  [id number]   cc:   Armanda Magic, M.D.  Sanjeev K. Corliss Skains, M.D.  Donia Guiles, M.D.

## 2010-05-22 NOTE — Cardiovascular Report (Signed)
Anna Farrell, Anna Farrell                 ACCOUNT NO.:  [id number]   MEDICAL RECORD NO.:  [id number]          PATIENT TYPE:  OIB   LOCATION:  1965                         FACILITY:  [facility]   PHYSICIAN:  Armanda Magic, M.D.     DATE OF BIRTH:  03/18/1927   DATE OF PROCEDURE:  06/22/2007  DATE OF DISCHARGE:  06/22/2007                            CARDIAC CATHETERIZATION   REFERRING PHYSICIAN:  Donia Guiles, MD.   PROCEDURES:  1. Right and left heart catheterization.  2. Coronary angiography.  3. Left ventriculography.   OPERATOR:  Armanda Magic, MD   INDICATIONS:  Shortness of breath, rule out pulmonary hypertension and  coronary disease.   MEDICATIONS:  1. Versed 1 mg.  2. Fentanyl 25 mcg.  3. Zofran 4 mg IV.   COMPLICATIONS:  None.   IV ACCESS:  Via right femoral artery, 4-French sheath and right femoral  vein, 6-French sheath.   The patient was brought to the Cardiac Catheterization Laboratory in a  fasting nonsedated state.  Informed consent was obtained.  The patient  was connected to continuous heart rate and pulse oximetry monitoring and  intermittent blood pressure monitoring.  The right groin was prepped and  draped in sterile fashion.  Xylocaine 1% was used for local anesthesia.  Using the modified Seldinger technique, a 4-French sheath was placed in  the right femoral artery.  Under fluoroscopic guidance, a 4-French JL-4  catheter was placed in the vicinity of the coronary ostium of the left  coronary artery, but could not engage the ostium adequately.  The  catheter was exchanged out over a guidewire for a 4-French 3-DRC  catheter, which successfully engaged the right coronary ostium.  Multiple same films were taken in the 30-degree RAO and 40-degree LAO  views. This catheter was then exchanged out over a guidewire for a 4-  [place] JL-5 catheter, which successfully engaged the coronary ostium.  It appeared to be a dual ostium of the left circumflex and LAD and  multiple shots had to be taken to be able to visualize both coronary  arteries adequately.  The catheter was then removed over a guide wire  and a 4-French angled pigtail catheter was placed in left ventricular  cavity.  Left ventriculography was performed in the 30-degree RAO view  using a total of 30 mL of contrast and 15 mL per second.  Catheter was  then pulled back across the aortic valve with no significant gradient  noted.  Using modified Seldinger technique, a 7-French sheath was placed  in the right femoral vein.  Under fluoroscopic guidance, a Swan-Ganz  catheter was placed under balloon flotation into the right atrium.  Right atrial pressure and O2 saturation was obtained.  Also, aortic  saturation was obtained as well.  The catheter was then advanced in the  right ventricle and right ventricular pressure was measured.  The  catheter was advanced in the pulmonary artery and pulmonary arterial  pressure was measured as well as O2 saturations of the pulmonary artery.  Catheter was then allowed to migrate into the pulmonary capillary wedge  position and  pulmonary capillary wedge pressure was measured.  The  balloon was deflated and removed.  At the end of procedure, all  catheters and sheaths were removed.  Manual compression was performed  until adequate hemostasis was obtained.  The patient was transferred  back to her room in stable condition.   RESULTS:  1. Right heart cath data:  Right atrial pressure 7/5 with a mean of 4      mmHg.  RV pressure 27/3 with a mean of 9 mmHg.  PA pressure 26/10      with a mean of 17 mmHg.  Pulmonary capillary wedge 10/9 with a mean      of 7 mmHg.  Pulmonary artery O2 saturations 64%.  Right atrial O2      saturation 97%.  Aortic O2 saturation 93%.  Cardiac output by Fick      was 4.2.  Cardiac index by Fick 2.4.  LVEDP was an 17 mmHg.  LV      pressure 146/21 mmHg.  Aortic pressure 146/78 mmHg.   The left coronary artery had a dual ostium to  the left anterior  descending and left circumflex artery.  The left anterior descending  artery is widely patent throughout its course of the apex.  It gives  rise to a very large first diagonal branch, which bifurcates into 2  daughter branches, both of which are widely patent and then gives rise  to a second diagonal which is widely patent.   The left circumflex is widely patent throughout its course in the AV  groove.  It gives rise to a large first obtuse marginal branch which is  widely patent and then a second obtuse marginal branch which bifurcates  into 2 daughter branches, both of which are widely patent.  The ongoing  circumflex traverses the AV groove and is widely patent.   The right coronary is widely patent throughout its course and distally  bifurcates into posterior descending artery and posterolateral artery,  both of which are widely patent.   Left ventriculography shows normal LV function with hyperdynamic  function, EF of 70%.  There was a PVC-induced mitral regurgitation.   ASSESSMENT:  1. Normal coronary arteries.  2. Normal right heart cath pressures.  3. Normal left ventricular function.  4. Mildly elevated left ventricular end-diastolic volume consistent      with mild diastolic dysfunction.  5. Shortness of breath consistent with primary pulmonary etiology.      Pulmonary function test did show small R-wave obstructive disease.   PLAN:  We will discharge to home with outpatient pulmonary followup.  She will follow up with my nurse practitioner in 2 weeks for groin  check.      Armanda Magic, M.D.  Electronically Signed     TT/MEDQ  D:  06/22/2007  T:  06/23/2007  Job:  [id number]   cc:   Donia Guiles, M.D.

## 2010-06-29 ENCOUNTER — Other Ambulatory Visit: Payer: Self-pay | Admitting: Critical Care Medicine

## 2010-06-29 NOTE — Telephone Encounter (Signed)
Pt was last seen by PW on 02/2009. She has no pending appts.  Per previous refill encounter on 04/17/10 by PW -- he approved these meds x 1 month but pt needed to make and appt for f/u.  I called the pt and explained to her she must be seen by provider at least once yearly for refills, etc. She is aware she was last seen in 02/2009 and will need to schedule OV.  OV scheduled with PW for August 01, 2010 at 10:45 -- pt aware refill will be sent but she must keep this OV for any further refills.  She verbalized understanding of this.

## 2010-07-30 ENCOUNTER — Encounter: Payer: Self-pay | Admitting: Critical Care Medicine

## 2010-08-01 ENCOUNTER — Ambulatory Visit (INDEPENDENT_AMBULATORY_CARE_PROVIDER_SITE_OTHER): Payer: Medicare Other | Admitting: Critical Care Medicine

## 2010-08-01 ENCOUNTER — Encounter: Payer: Self-pay | Admitting: Critical Care Medicine

## 2010-08-01 VITALS — BP 132/86 | HR 75 | Temp 97.8°F | Ht 61.0 in | Wt 148.4 lb

## 2010-08-01 DIAGNOSIS — J45909 Unspecified asthma, uncomplicated: Secondary | ICD-10-CM

## 2010-08-01 MED ORDER — ALBUTEROL SULFATE HFA 108 (90 BASE) MCG/ACT IN AERS: 2.0000 | INHALATION_SPRAY | Freq: Four times a day (QID) | RESPIRATORY_TRACT | Status: DC | PRN

## 2010-08-01 MED ORDER — MOMETASONE FURO-FORMOTEROL FUM 200-5 MCG/ACT IN AERO: 2.0000 | INHALATION_SPRAY | Freq: Two times a day (BID) | RESPIRATORY_TRACT | Status: DC

## 2010-08-01 NOTE — Assessment & Plan Note (Addendum)
FeV1 70% 7/12 Moderate persistent asthma  Plan D/c serevent/qvar Start Dulera 200 two puff bid Return in  4 months

## 2010-08-01 NOTE — Progress Notes (Signed)
Subjective:    Patient ID: Anna Farrell, female    DOB: 1927-09-21, 77 y.o.   MRN: [id number]  HPI 77 yo WF with moderate persistent asthma  February 24, 2009 9:53 AM  Dyspnea is better. No real cough. No wheeze . Is still taking symbicort and foradil and qvar!!  Pt denies any significant sore throat, nasal congestion or excess secretions, fever, chills, sweats, unintended weight loss, pleurtic or exertional chest pain, orthopnea PND, or leg swelling  Pt denies any increase in rescue therapy over baseline, denies waking up needing it or having any early am or nocturnal exacerbations of coughing/wheezing/or dyspnea.  February 24, 2009 9:53 AM  Dyspnea is better. No real cough. No wheeze . Is still taking symbicort and foradil and qvar!!   08/01/2010 Dyspnea doing well.  No cough.  No real wheeze.  No chest pain.   Asthma History: Symptoms >2 days/week Nighttime Awakenings 0-2/month Asthma interference with normal activity Minor limitations SABA use (not for EIB) 0-2 days/wk Risk: Exacerbations requiring oral systemic steroids 0-1 / year Pt denies any significant sore throat, nasal congestion or excess secretions, fever, chills, sweats, unintended weight loss, pleurtic or exertional chest pain, orthopnea PND, or leg swelling Pt denies any increase in rescue therapy over baseline, denies waking up needing it or having any early am or nocturnal exacerbations of coughing/wheezing/or dyspnea. Pt also denies any obvious fluctuation in symptoms with  weather or environmental change or other alleviating or aggravating factors   Past Medical History  Diagnosis Date  . Tachycardia-bradycardia syndrome   . Atrial fibrillation   . Vasovagal syncope   . Hypertension   . Hyperlipidemia   . Orthostatic hypotension   . Asthma      History reviewed. No pertinent family history.   History   Social History  . Marital Status: Widowed    Spouse Name: N/A    Number of Children: N/A  . Years of  Education: N/A   Occupational History  . Waitress     [employer]   Social History Main Topics  . Smoking status: Never Smoker   . Smokeless tobacco: Never Used  . Alcohol Use: Not on file  . Drug Use: Not on file  . Sexually Active: Not on file   Other Topics Concern  . Not on file   Social History Narrative  . No narrative on file     Allergies  Allergen Reactions  . Codeine      Outpatient Prescriptions Prior to Visit  Medication Sig Dispense Refill  . acetaminophen (TYLENOL) 500 MG tablet Take 500 mg by mouth every 6 (six) hours as needed.        . Cholecalciferol (VITAMIN D3) 1000 UNITS CAPS Take 1 capsule by mouth daily.        Marland Kitchen warfarin (COUMADIN) 3 MG tablet 3 mg. Take as directed       . QVAR 80 MCG/ACT inhaler INHALE 2 PUFFS TWICE A DAY  1 Inhaler  0  . SEREVENT DISKUS 50 MCG/DOSE diskus inhaler USE 1 INHALATION TWICE DAILY  1 each  0  . amiodarone (PACERONE) 200 MG tablet 1 1/2 once daily       . metoprolol (TOPROL-XL) 50 MG 24 hr tablet Take 50 mg by mouth daily.        . montelukast (SINGULAIR) 10 MG tablet Take 10 mg by mouth at bedtime.        . rosuvastatin (CRESTOR) 5 MG tablet Take 5 mg by mouth  daily.           Review of Systems Constitutional:   No  weight loss, night sweats,  Fevers, chills, fatigue, lassitude. HEENT:   No headaches,  Difficulty swallowing,  Tooth/dental problems,  Sore throat,                No sneezing, itching, ear ache, nasal congestion, post nasal drip,   CV:  No chest pain,  Orthopnea, PND, swelling in lower extremities, anasarca, dizziness, palpitations  GI  No heartburn, indigestion, abdominal pain, nausea, vomiting, diarrhea, change in bowel habits, loss of appetite  Resp: Notes  shortness of breath with exertion not at rest.  No excess mucus, no productive cough,  No non-productive cough,  No coughing up of blood.  No change in color of mucus.  No wheezing.  No chest wall deformity  Skin: no rash or lesions.  GU: no  dysuria, change in color of urine, no urgency or frequency.  No flank pain.  MS:  No joint pain or swelling.  No decreased range of motion.  No back pain.  Psych:  No change in mood or affect. No depression or anxiety.  No memory loss.     Objective:   Physical Exam Filed Vitals:   08/01/10 1055  BP: 132/86  Pulse: 75  Temp: 97.8 F (36.6 C)  TempSrc: Oral  Height: 5\' 1"  (1.549 m)  Weight: 148 lb 6.4 oz (67.314 kg)  SpO2: 96%    Gen: Pleasant, well-nourished, in no distress,  normal affect  ENT: No lesions,  mouth clear,  oropharynx clear, no postnasal drip  Neck: No JVD, no TMG, no carotid bruits  Lungs: No use of accessory muscles, no dullness to percussion, clear without rales or rhonchi  Cardiovascular: RRR, heart sounds normal, no murmur or gallops, no peripheral edema  Abdomen: soft and NT, no HSM,  BS normal  Musculoskeletal: No deformities, no cyanosis or clubbing  Neuro: alert, non focal  Skin: Warm, no lesions or rashes     PFT Conversion 10/09/2007  FVC 2.16  FVC PREDICT 2.45  FVC  % Predicted 88  FEV1 1.39  FEV1 PREDICT 1.84  FEV % Predicted 75  FEV1/FVC 64.4  FEV1/FVC PRE 82  FeF 25-75 0.58  FeF 25-75 % Predicted 0.63  FEF % EXPEC 35   08/01/10:  Cleda Daub: FeV1 70%  Assessment & Plan:   Extrinsic asthma, unspecified FeV1 70% 7/12 Moderate persistent asthma  Plan D/c serevent/qvar Start Dulera 200 two puff bid Return in  4 months      Updated Medication List Outpatient Encounter Prescriptions as of 08/01/2010  Medication Sig Dispense Refill  . acetaminophen (TYLENOL) 500 MG tablet Take 500 mg by mouth every 6 (six) hours as needed.        Marland Kitchen amLODipine (NORVASC) 5 MG tablet As needed      . cetirizine (ZYRTEC) 10 MG tablet Take 10 mg by mouth daily as needed.        . Cholecalciferol (VITAMIN D3) 1000 UNITS CAPS Take 1 capsule by mouth daily.        . cyanocobalamin ([employer] VITAMIN B-12) 500 MCG tablet Take 500 mcg by mouth once a week.         Marland Kitchen HYDROcodone-acetaminophen (NORCO) 7.5-325 MG per tablet As needed      . metoprolol succinate (TOPROL-XL) 25 MG 24 hr tablet Take 25 mg by mouth daily.        . pravastatin (PRAVACHOL) 40 MG tablet  Once daily      . traMADol (ULTRAM) 50 MG tablet As needed      . warfarin (COUMADIN) 3 MG tablet 3 mg. Take as directed       . DISCONTD: QVAR 80 MCG/ACT inhaler INHALE 2 PUFFS TWICE A DAY  1 Inhaler  0  . DISCONTD: SEREVENT DISKUS 50 MCG/DOSE diskus inhaler USE 1 INHALATION TWICE DAILY  1 each  0  . albuterol (PROAIR HFA) 108 (90 BASE) MCG/ACT inhaler Inhale 2 puffs into the lungs every 6 (six) hours as needed for wheezing.  1 Inhaler  4  . Mometasone Furo-Formoterol Fum (DULERA) 200-5 MCG/ACT AERO Inhale 2 puffs into the lungs 2 (two) times daily.  3 Inhaler  4  . DISCONTD: amiodarone (PACERONE) 200 MG tablet 1 1/2 once daily       . DISCONTD: metoprolol (TOPROL-XL) 50 MG 24 hr tablet Take 50 mg by mouth daily.        Marland Kitchen DISCONTD: montelukast (SINGULAIR) 10 MG tablet Take 10 mg by mouth at bedtime.        Marland Kitchen DISCONTD: rosuvastatin (CRESTOR) 5 MG tablet Take 5 mg by mouth daily.

## 2010-08-01 NOTE — Patient Instructions (Addendum)
Stop serevent and Qvar Start Dulera two puff twice daily Proair  is as needed Return 4 months

## 2010-10-04 LAB — POCT I-STAT 3, VENOUS BLOOD GAS (G3P V)
Acid-Base Excess: 3 — ABNORMAL HIGH
Bicarbonate: 25 — ABNORMAL HIGH
Bicarbonate: 28.2 — ABNORMAL HIGH
O2 Saturation: 64
Operator id: 141321
TCO2: 26
TCO2: 30
pCO2, Ven: 45.9
pH, Ven: 7.384 — ABNORMAL HIGH
pO2, Ven: 34
pO2, Ven: 36

## 2010-10-04 LAB — POCT I-STAT 3, ART BLOOD GAS (G3+)
O2 Saturation: 93
pCO2 arterial: 39.8
pH, Arterial: 7.456 — ABNORMAL HIGH
pO2, Arterial: 65 — ABNORMAL LOW

## 2010-10-12 LAB — CBC
HCT: 38 % (ref 36.0–46.0)
HCT: 38.1 % (ref 36.0–46.0)
Hemoglobin: 10.5 g/dL — ABNORMAL LOW (ref 12.0–15.0)
Hemoglobin: 12 g/dL (ref 12.0–15.0)
Hemoglobin: 12.3 g/dL (ref 12.0–15.0)
MCHC: 32.2 g/dL (ref 30.0–36.0)
MCHC: 32.4 g/dL (ref 30.0–36.0)
MCHC: 32.9 g/dL (ref 30.0–36.0)
MCHC: 33.2 g/dL (ref 30.0–36.0)
MCV: 88.7 fL (ref 78.0–100.0)
MCV: 88.7 fL (ref 78.0–100.0)
MCV: 89.4 fL (ref 78.0–100.0)
Platelets: 228 K/uL (ref 150–400)
Platelets: 244 10*3/uL (ref 150–400)
RBC: 3.6 MIL/uL — ABNORMAL LOW (ref 3.87–5.11)
RBC: 3.62 MIL/uL — ABNORMAL LOW (ref 3.87–5.11)
RBC: 3.68 MIL/uL — ABNORMAL LOW (ref 3.87–5.11)
RBC: 4.28 MIL/uL (ref 3.87–5.11)
RDW: 14.6 % (ref 11.5–15.5)
RDW: 14.8 % (ref 11.5–15.5)
RDW: 15.2 % (ref 11.5–15.5)
WBC: 4.8 10*3/uL (ref 4.0–10.5)
WBC: 5.6 10*3/uL (ref 4.0–10.5)
WBC: 7.5 K/uL (ref 4.0–10.5)

## 2010-10-12 LAB — BASIC METABOLIC PANEL
BUN: 8 mg/dL (ref 6–23)
Calcium: 8.1 mg/dL — ABNORMAL LOW (ref 8.4–10.5)
Calcium: 9.2 mg/dL (ref 8.4–10.5)
Chloride: 108 mEq/L (ref 96–112)
Creatinine, Ser: 0.63 mg/dL (ref 0.4–1.2)
Creatinine, Ser: 0.67 mg/dL (ref 0.4–1.2)
GFR calc Af Amer: >60 mL/min (ref >60)
GFR calc Af Amer: >60 mL/min (ref >60)
GFR calc non Af Amer: >60 mL/min (ref >60)
GFR calc non Af Amer: >60 mL/min (ref >60)
Glucose, Bld: 109 mg/dL — ABNORMAL HIGH (ref 70–99)

## 2010-10-12 LAB — DIFFERENTIAL
Basophils Absolute: 0 10*3/uL (ref 0.0–0.1)
Basophils Absolute: 0.1 K/uL (ref 0.0–0.1)
Basophils Relative: 0 % (ref 0–1)
Basophils Relative: 1 % (ref 0–1)
Eosinophils Absolute: 0.2 10*3/uL (ref 0.0–0.7)
Eosinophils Absolute: 0.2 10*3/uL (ref 0.0–0.7)
Eosinophils Relative: 2 % (ref 0–5)
Eosinophils Relative: 4 % (ref 0–5)
Lymphocytes Relative: 10 % — ABNORMAL LOW (ref 12–46)
Lymphs Abs: 0.8 10*3/uL (ref 0.7–4.0)
Monocytes Absolute: 0.6 10*3/uL (ref 0.1–1.0)
Monocytes Absolute: 1 K/uL (ref 0.1–1.0)
Monocytes Relative: 13 % — ABNORMAL HIGH (ref 3–12)
Neutro Abs: 2.9 10*3/uL (ref 1.7–7.7)
Neutro Abs: 5.5 10*3/uL (ref 1.7–7.7)
Neutrophils Relative %: 74 % (ref 43–77)

## 2010-10-12 LAB — PROTIME-INR
INR: 1.1 (ref 0.00–1.49)
INR: 3.9 — ABNORMAL HIGH (ref 0.00–1.49)
INR: 4.9 — ABNORMAL HIGH (ref 0.00–1.49)
INR: 5 — ABNORMAL HIGH (ref 0.00–1.49)
Prothrombin Time: 14.5 seconds (ref 11.6–15.2)
Prothrombin Time: 19.7 seconds — ABNORMAL HIGH (ref 11.6–15.2)
Prothrombin Time: 41.8 seconds — ABNORMAL HIGH (ref 11.6–15.2)
Prothrombin Time: 50.3 seconds — ABNORMAL HIGH (ref 11.6–15.2)
Prothrombin Time: 50.8 seconds — ABNORMAL HIGH (ref 11.6–15.2)

## 2010-10-12 LAB — COMPREHENSIVE METABOLIC PANEL
ALT: 29 U/L (ref 0–35)
ALT: 30 U/L (ref 0–35)
Albumin: 2.6 g/dL — ABNORMAL LOW (ref 3.5–5.2)
Alkaline Phosphatase: 73 U/L (ref 39–117)
BUN: 12 mg/dL (ref 6–23)
BUN: 21 mg/dL (ref 6–23)
CO2: 26 mEq/L (ref 19–32)
CO2: 29 mEq/L (ref 19–32)
Calcium: 8.4 mg/dL (ref 8.4–10.5)
Calcium: 9.2 mg/dL (ref 8.4–10.5)
Chloride: 104 mEq/L (ref 96–112)
Creatinine, Ser: 0.8 mg/dL (ref 0.4–1.2)
GFR calc non Af Amer: >60 mL/min (ref >60)
GFR calc non Af Amer: >60 mL/min (ref >60)
Glucose, Bld: 113 mg/dL — ABNORMAL HIGH (ref 70–99)
Glucose, Bld: 127 mg/dL — ABNORMAL HIGH (ref 70–99)
Potassium: 3.4 mEq/L — ABNORMAL LOW (ref 3.5–5.1)
Sodium: 138 mEq/L (ref 135–145)
Sodium: 139 mEq/L (ref 135–145)
Total Bilirubin: 0.5 mg/dL (ref 0.3–1.2)
Total Protein: 5.7 g/dL — ABNORMAL LOW (ref 6.0–8.3)

## 2010-10-12 LAB — FOLATE: Folate: >20 ng/mL

## 2010-10-12 LAB — MAGNESIUM: Magnesium: 2.2 mg/dL (ref 1.5–2.5)

## 2010-10-12 LAB — URINE MICROSCOPIC-ADD ON

## 2010-10-12 LAB — URINE CULTURE
Colony Count: >=100000
Colony Count: NO GROWTH
Culture: NO GROWTH

## 2010-10-12 LAB — COMPREHENSIVE METABOLIC PANEL WITH GFR
AST: 30 U/L (ref 0–37)
Albumin: 2.6 g/dL — ABNORMAL LOW (ref 3.5–5.2)
Alkaline Phosphatase: 64 U/L (ref 39–117)
Chloride: 106 meq/L (ref 96–112)
Creatinine, Ser: 0.78 mg/dL (ref 0.4–1.2)
GFR calc Af Amer: >60 mL/min (ref >60)
Potassium: 4.5 meq/L (ref 3.5–5.1)
Total Bilirubin: 0.9 mg/dL (ref 0.3–1.2)
Total Protein: 5.4 g/dL — ABNORMAL LOW (ref 6.0–8.3)

## 2010-10-12 LAB — APTT: aPTT: 25 seconds (ref 24–37)

## 2010-10-12 LAB — IRON AND TIBC
Iron: 18 ug/dL — ABNORMAL LOW (ref 42–135)
TIBC: 245 ug/dL — ABNORMAL LOW (ref 250–470)

## 2010-10-12 LAB — URINALYSIS, ROUTINE W REFLEX MICROSCOPIC
Bilirubin Urine: NEGATIVE
Glucose, UA: NEGATIVE mg/dL
Glucose, UA: NEGATIVE mg/dL
Hgb urine dipstick: NEGATIVE
Protein, ur: NEGATIVE mg/dL
Specific Gravity, Urine: 1.024 (ref 1.005–1.030)
Urobilinogen, UA: 0.2 mg/dL (ref 0.0–1.0)

## 2010-10-12 LAB — RETICULOCYTES: Retic Count, Absolute: 55.9 10*3/uL (ref 19.0–186.0)

## 2010-10-12 LAB — VITAMIN B12: Vitamin B-12: 184 pg/mL — ABNORMAL LOW (ref 211–911)

## 2010-10-18 LAB — CBC
HCT: 39.8
MCHC: 33.9
MCV: 94.5
Platelets: 227
RBC: 4.21

## 2010-10-18 LAB — COMPREHENSIVE METABOLIC PANEL
AST: 21
BUN: 12
CO2: 28
Calcium: 9.9
Creatinine, Ser: 0.75
GFR calc Af Amer: >60
GFR calc non Af Amer: >60
Total Bilirubin: 0.4

## 2010-10-18 LAB — PROTIME-INR
INR: 1.9 — ABNORMAL HIGH
INR: 2 — ABNORMAL HIGH
Prothrombin Time: 22.6 — ABNORMAL HIGH
Prothrombin Time: 23.3 — ABNORMAL HIGH

## 2010-10-18 LAB — DIFFERENTIAL
Basophils Absolute: 0
Eosinophils Relative: 2
Lymphocytes Relative: 22
Lymphs Abs: 1.3
Neutro Abs: 3.9
Neutrophils Relative %: 64

## 2010-10-18 LAB — TSH: TSH: 1.525

## 2011-01-10 DIAGNOSIS — Z950 Presence of cardiac pacemaker: Secondary | ICD-10-CM | POA: Diagnosis not present

## 2011-01-10 DIAGNOSIS — Z7901 Long term (current) use of anticoagulants: Secondary | ICD-10-CM | POA: Diagnosis not present

## 2011-01-10 DIAGNOSIS — I4891 Unspecified atrial fibrillation: Secondary | ICD-10-CM | POA: Diagnosis not present

## 2011-03-07 DIAGNOSIS — Z7901 Long term (current) use of anticoagulants: Secondary | ICD-10-CM | POA: Diagnosis not present

## 2011-03-07 DIAGNOSIS — I4891 Unspecified atrial fibrillation: Secondary | ICD-10-CM | POA: Diagnosis not present

## 2011-04-04 DIAGNOSIS — I4891 Unspecified atrial fibrillation: Secondary | ICD-10-CM | POA: Diagnosis not present

## 2011-04-04 DIAGNOSIS — Z7901 Long term (current) use of anticoagulants: Secondary | ICD-10-CM | POA: Diagnosis not present

## 2011-04-15 DIAGNOSIS — I495 Sick sinus syndrome: Secondary | ICD-10-CM | POA: Diagnosis not present

## 2011-04-15 DIAGNOSIS — Z950 Presence of cardiac pacemaker: Secondary | ICD-10-CM | POA: Diagnosis not present

## 2011-05-02 DIAGNOSIS — Z7901 Long term (current) use of anticoagulants: Secondary | ICD-10-CM | POA: Diagnosis not present

## 2011-05-02 DIAGNOSIS — I4891 Unspecified atrial fibrillation: Secondary | ICD-10-CM | POA: Diagnosis not present

## 2011-05-08 DIAGNOSIS — K529 Noninfective gastroenteritis and colitis, unspecified: Secondary | ICD-10-CM

## 2011-05-08 HISTORY — DX: Noninfective gastroenteritis and colitis, unspecified: K52.9

## 2011-05-30 DIAGNOSIS — Z7901 Long term (current) use of anticoagulants: Secondary | ICD-10-CM | POA: Diagnosis not present

## 2011-05-30 DIAGNOSIS — I4891 Unspecified atrial fibrillation: Secondary | ICD-10-CM | POA: Diagnosis not present

## 2011-06-03 ENCOUNTER — Emergency Department (HOSPITAL_COMMUNITY): Payer: Medicare Other

## 2011-06-03 ENCOUNTER — Emergency Department (HOSPITAL_COMMUNITY)
Admission: EM | Admit: 2011-06-03 | Discharge: 2011-06-03 | Disposition: A | Discharge status: Home / Self Care | Payer: Medicare Other | Attending: Emergency Medicine | Admitting: Emergency Medicine

## 2011-06-03 ENCOUNTER — Encounter (HOSPITAL_COMMUNITY): Payer: Self-pay | Admitting: *Deleted

## 2011-06-03 DIAGNOSIS — Z7901 Long term (current) use of anticoagulants: Secondary | ICD-10-CM | POA: Insufficient documentation

## 2011-06-03 DIAGNOSIS — R0682 Tachypnea, not elsewhere classified: Secondary | ICD-10-CM | POA: Diagnosis not present

## 2011-06-03 DIAGNOSIS — J45909 Unspecified asthma, uncomplicated: Secondary | ICD-10-CM | POA: Insufficient documentation

## 2011-06-03 DIAGNOSIS — Z79899 Other long term (current) drug therapy: Secondary | ICD-10-CM | POA: Insufficient documentation

## 2011-06-03 DIAGNOSIS — K5289 Other specified noninfective gastroenteritis and colitis: Secondary | ICD-10-CM | POA: Insufficient documentation

## 2011-06-03 DIAGNOSIS — R000 Tachycardia, unspecified: Secondary | ICD-10-CM | POA: Insufficient documentation

## 2011-06-03 DIAGNOSIS — R109 Unspecified abdominal pain: Secondary | ICD-10-CM | POA: Insufficient documentation

## 2011-06-03 DIAGNOSIS — I4891 Unspecified atrial fibrillation: Secondary | ICD-10-CM | POA: Diagnosis not present

## 2011-06-03 DIAGNOSIS — I10 Essential (primary) hypertension: Secondary | ICD-10-CM | POA: Insufficient documentation

## 2011-06-03 DIAGNOSIS — R112 Nausea with vomiting, unspecified: Secondary | ICD-10-CM | POA: Insufficient documentation

## 2011-06-03 DIAGNOSIS — K8020 Calculus of gallbladder without cholecystitis without obstruction: Secondary | ICD-10-CM | POA: Insufficient documentation

## 2011-06-03 DIAGNOSIS — K529 Noninfective gastroenteritis and colitis, unspecified: Secondary | ICD-10-CM

## 2011-06-03 DIAGNOSIS — E785 Hyperlipidemia, unspecified: Secondary | ICD-10-CM | POA: Insufficient documentation

## 2011-06-03 DIAGNOSIS — R110 Nausea: Secondary | ICD-10-CM | POA: Diagnosis not present

## 2011-06-03 LAB — COMPREHENSIVE METABOLIC PANEL
ALT: 12 U/L (ref 0–35)
AST: 21 U/L (ref 0–37)
CO2: 23 mEq/L (ref 19–32)
Calcium: 9.7 mg/dL (ref 8.4–10.5)
Creatinine, Ser: 0.63 mg/dL (ref 0.50–1.10)
GFR calc Af Amer: >90 mL/min (ref >90)
GFR calc non Af Amer: 82 mL/min — ABNORMAL LOW (ref >90)
Sodium: 136 mEq/L (ref 135–145)
Total Protein: 7 g/dL (ref 6.0–8.3)

## 2011-06-03 LAB — URINALYSIS, ROUTINE W REFLEX MICROSCOPIC
Glucose, UA: NEGATIVE mg/dL
Protein, ur: NEGATIVE mg/dL
Specific Gravity, Urine: 1.015 (ref 1.005–1.030)
Urobilinogen, UA: 0.2 mg/dL (ref 0.0–1.0)

## 2011-06-03 LAB — CBC
HCT: 34.2 % — ABNORMAL LOW (ref 36.0–46.0)
MCH: 23.5 pg — ABNORMAL LOW (ref 26.0–34.0)
MCHC: 30.1 g/dL (ref 30.0–36.0)
MCV: 77.9 fL — ABNORMAL LOW (ref 78.0–100.0)
Platelets: 303 10*3/uL (ref 150–400)
RDW: 16.4 % — ABNORMAL HIGH (ref 11.5–15.5)

## 2011-06-03 LAB — DIFFERENTIAL
Basophils Absolute: 0 10*3/uL (ref 0.0–0.1)
Eosinophils Absolute: 0 10*3/uL (ref 0.0–0.7)
Eosinophils Relative: 0 % (ref 0–5)
Lymphocytes Relative: 8 % — ABNORMAL LOW (ref 12–46)
Monocytes Absolute: 1 10*3/uL (ref 0.1–1.0)

## 2011-06-03 LAB — URINE MICROSCOPIC-ADD ON

## 2011-06-03 LAB — PROTIME-INR
INR: 1.85 — ABNORMAL HIGH (ref 0.00–1.49)
Prothrombin Time: 21.7 seconds — ABNORMAL HIGH (ref 11.6–15.2)

## 2011-06-03 MED ORDER — METRONIDAZOLE 500 MG PO TABS: 500.0000 mg | ORAL_TABLET | Freq: Three times a day (TID) | ORAL | Status: AC

## 2011-06-03 MED ORDER — TRAMADOL HCL 50 MG PO TABS: 50.0000 mg | ORAL_TABLET | Freq: Four times a day (QID) | ORAL | Status: AC | PRN

## 2011-06-03 MED ORDER — ONDANSETRON HCL 4 MG/2ML IJ SOLN
4.0000 mg | Freq: Once | INTRAMUSCULAR | Status: AC
  Administered 2011-06-03: 4 mg via INTRAVENOUS
  Filled 2011-06-03: qty 2

## 2011-06-03 MED ORDER — TRAMADOL HCL 50 MG PO TABS: 50.0000 mg | ORAL_TABLET | Freq: Four times a day (QID) | ORAL | Status: DC | PRN

## 2011-06-03 MED ORDER — CIPROFLOXACIN HCL 500 MG PO TABS: 500.0000 mg | ORAL_TABLET | Freq: Two times a day (BID) | ORAL | Status: DC

## 2011-06-03 MED ORDER — CIPROFLOXACIN HCL 500 MG PO TABS
500.0000 mg | ORAL_TABLET | Freq: Once | ORAL | Status: AC
  Administered 2011-06-03: 500 mg via ORAL
  Filled 2011-06-03: qty 1

## 2011-06-03 MED ORDER — METRONIDAZOLE 500 MG PO TABS
500.0000 mg | ORAL_TABLET | Freq: Once | ORAL | Status: AC
  Administered 2011-06-03: 500 mg via ORAL
  Filled 2011-06-03: qty 1

## 2011-06-03 MED ORDER — IOHEXOL 300 MG/ML  SOLN
100.0000 mL | Freq: Once | INTRAMUSCULAR | Status: AC | PRN
  Administered 2011-06-03: 100 mL via INTRAVENOUS

## 2011-06-03 MED ORDER — CIPROFLOXACIN HCL 500 MG PO TABS: 500.0000 mg | ORAL_TABLET | Freq: Two times a day (BID) | ORAL | Status: AC

## 2011-06-03 MED ORDER — HYDROMORPHONE HCL PF 1 MG/ML IJ SOLN
0.5000 mg | Freq: Once | INTRAMUSCULAR | Status: AC
  Administered 2011-06-03: 0.5 mg via INTRAVENOUS
  Filled 2011-06-03: qty 1

## 2011-06-03 MED ORDER — METOCLOPRAMIDE HCL 10 MG PO TABS: 10.0000 mg | ORAL_TABLET | Freq: Four times a day (QID) | ORAL | Status: DC

## 2011-06-03 MED ORDER — SODIUM CHLORIDE 0.9 % IV SOLN
Freq: Once | INTRAVENOUS | Status: AC
  Administered 2011-06-03: 05:00:00 via INTRAVENOUS

## 2011-06-03 MED ORDER — METRONIDAZOLE 500 MG PO TABS: 500.0000 mg | ORAL_TABLET | Freq: Three times a day (TID) | ORAL | Status: DC

## 2011-06-03 NOTE — ED Provider Notes (Signed)
History     CSN: [id number]  Arrival date & time 06/03/11  0440   First MD Initiated Contact with Patient 06/03/11 0448      Chief Complaint  Patient presents with  . Abdominal Pain    (Consider location/radiation/quality/duration/timing/severity/associated sxs/prior treatment) Patient is a 78 y.o. female presenting with abdominal pain. The history is provided by the patient.  Abdominal Pain The primary symptoms of the illness include abdominal pain.  She had onset yesterday afternoon of crampy lower abdominal pain after eating a sandwich. She states that this happens frequently and she usually feels better after having 1 or 2 bowel movements. However, this pain never got better. There is associated nausea and vomiting. Pain does not radiate to the back, chest, shoulder, or upper abdomen. She describes it as dull and rated at 5/10. She denies fever, chills, sweats. Of note, she was diagnosed with gallstones but elected not to have surgery.  Past Medical History  Diagnosis Date  . Tachycardia-bradycardia syndrome   . Atrial fibrillation   . Vasovagal syncope   . Hypertension   . Hyperlipidemia   . Orthostatic hypotension   . Asthma     Past Surgical History  Procedure Date  . Vesicovaginal fistula closure w/ tah 1981  . Pacemaker insertion 10/11/2006    History reviewed. No pertinent family history.  History  Substance Use Topics  . Smoking status: Never Smoker   . Smokeless tobacco: Never Used  . Alcohol Use: No    OB History    Grav Para Term Preterm Abortions TAB SAB Ect Mult Living                  Review of Systems  Gastrointestinal: Positive for abdominal pain.  All other systems reviewed and are negative.    Allergies  Codeine  Home Medications   Current Outpatient Rx  Name Route Sig Dispense Refill  . ACETAMINOPHEN 500 MG PO TABS Oral Take 500 mg by mouth every 6 (six) hours as needed.      . ALBUTEROL SULFATE HFA 108 (90 BASE) MCG/ACT IN AERS  Inhalation Inhale 2 puffs into the lungs every 6 (six) hours as needed for wheezing. 1 Inhaler 4  . AMLODIPINE BESYLATE 5 MG PO TABS  As needed    . CETIRIZINE HCL 10 MG PO TABS Oral Take 10 mg by mouth daily as needed.      Marland Kitchen VITAMIN D3 1000 UNITS PO CAPS Oral Take 1 capsule by mouth daily.      . CYANOCOBALAMIN 500 MCG PO TABS Oral Take 500 mcg by mouth once a week.      Marland Kitchen HYDROCODONE-ACETAMINOPHEN 7.5-325 MG PO TABS  As needed    . METOPROLOL SUCCINATE ER 25 MG PO TB24 Oral Take 25 mg by mouth daily.      . MOMETASONE FURO-FORMOTEROL FUM 200-5 MCG/ACT IN AERO Inhalation Inhale 2 puffs into the lungs 2 (two) times daily. 3 Inhaler 4  . PRAVASTATIN SODIUM 40 MG PO TABS  Once daily    . TRAMADOL HCL 50 MG PO TABS  As needed    . WARFARIN SODIUM 3 MG PO TABS  3 mg. Take as directed       BP 108/60  Pulse 101  Temp(Src) 98.6 F (37 C) (Oral)  Resp 22  SpO2 97%  Physical Exam  Nursing note and vitals reviewed.  78 year old female is resting comfortably in no acute distress. Vital signs are significant for borderline tachycardia with heart rate  101, and tachypnea with respiratory rate of 22. Oxygen saturation is 97% which is normal. Head is normocephalic and atraumatic. PERRLA, EOMI. There is no scleral icterus. Mucous membranes are moist. Neck is nontender and supple. Back is nontender. Lungs are clear without rales, wheezes, or rhonchi. Heart has regular rate rhythm without murmur. Abdomen is soft, flat, with moderate right upper quadrant tenderness with a positive Murphy sign. There is very mild suprapubic tenderness without rebound or guarding. Peristalsis is decreased. Extremities have no cyanosis or edema, full range of motion is present. Skin is warm and dry without rash. Neurologic: Mental status is normal, cranial nerves are intact, there are no focal motor or sensory deficits.  ED Course  Procedures (including critical care time)  Results for orders placed during the hospital  encounter of 06/03/11  CBC      Component Value Range   WBC 14.0 (*) 4.0 - 10.5 (K/uL)   RBC 4.39  3.87 - 5.11 (MIL/uL)   Hemoglobin 10.3 (*) 12.0 - 15.0 (g/dL)   HCT 16.1 (*) 09.6 - 46.0 (%)   MCV 77.9 (*) 78.0 - 100.0 (fL)   MCH 23.5 (*) 26.0 - 34.0 (pg)   MCHC 30.1  30.0 - 36.0 (g/dL)   RDW 04.5 (*) 40.9 - 15.5 (%)   Platelets 303  150 - 400 (K/uL)  DIFFERENTIAL      Component Value Range   Neutrophils Relative 85 (*) 43 - 77 (%)   Neutro Abs 11.9 (*) 1.7 - 7.7 (K/uL)   Lymphocytes Relative 8 (*) 12 - 46 (%)   Lymphs Abs 1.1  0.7 - 4.0 (K/uL)   Monocytes Relative 7  3 - 12 (%)   Monocytes Absolute 1.0  0.1 - 1.0 (K/uL)   Eosinophils Relative 0  0 - 5 (%)   Eosinophils Absolute 0.0  0.0 - 0.7 (K/uL)   Basophils Relative 0  0 - 1 (%)   Basophils Absolute 0.0  0.0 - 0.1 (K/uL)  COMPREHENSIVE METABOLIC PANEL      Component Value Range   Sodium 136  135 - 145 (mEq/L)   Potassium 3.3 (*) 3.5 - 5.1 (mEq/L)   Chloride 100  96 - 112 (mEq/L)   CO2 23  19 - 32 (mEq/L)   Glucose, Bld 171 (*) 70 - 99 (mg/dL)   BUN 10  6 - 23 (mg/dL)   Creatinine, Ser 8.11  0.50 - 1.10 (mg/dL)   Calcium 9.7  8.4 - 91.4 (mg/dL)   Total Protein 7.0  6.0 - 8.3 (g/dL)   Albumin 3.9  3.5 - 5.2 (g/dL)   AST 21  0 - 37 (U/L)   ALT 12  0 - 35 (U/L)   Alkaline Phosphatase 79  39 - 117 (U/L)   Total Bilirubin 0.6  0.3 - 1.2 (mg/dL)   GFR calc non Af Amer 82 (*) >90 (mL/min)   GFR calc Af Amer >90  >90 (mL/min)  LIPASE, BLOOD      Component Value Range   Lipase 21  11 - 59 (U/L)  URINALYSIS, ROUTINE W REFLEX MICROSCOPIC      Component Value Range   Color, Urine YELLOW  YELLOW    APPearance CLOUDY (*) CLEAR    Specific Gravity, Urine 1.015  1.005 - 1.030    pH 6.5  5.0 - 8.0    Glucose, UA NEGATIVE  NEGATIVE (mg/dL)   Hgb urine dipstick NEGATIVE  NEGATIVE    Bilirubin Urine NEGATIVE  NEGATIVE  Ketones, ur NEGATIVE  NEGATIVE (mg/dL)   Protein, ur NEGATIVE  NEGATIVE (mg/dL)   Urobilinogen, UA 0.2   0.0 - 1.0 (mg/dL)   Nitrite NEGATIVE  NEGATIVE    Leukocytes, UA SMALL (*) NEGATIVE   PROTIME-INR      Component Value Range   Prothrombin Time 21.7 (*) 11.6 - 15.2 (seconds)   INR 1.85 (*) 0.00 - 1.49   URINE MICROSCOPIC-ADD ON      Component Value Range   Squamous Epithelial / LPF FEW (*) RARE    WBC, UA 7-10  <3 (WBC/hpf)   RBC / HPF 0-2  <3 (RBC/hpf)   Bacteria, UA MANY (*) RARE    Ct Abdomen Pelvis W Contrast  06/03/2011  *RADIOLOGY REPORT*  Clinical Data: Abdominal pain.  Nausea.  CT ABDOMEN AND PELVIS WITH CONTRAST  Technique:  Multidetector CT imaging of the abdomen and pelvis was performed following the standard protocol during bolus administration of intravenous contrast.  Contrast: OMNIPAQUE IOHEXOL 300 MG/ML  SOLN  Comparison: 02/02/2007 from [facility].  Findings: Slight fibrosis in the lung bases.  Cholelithiasis without gallbladder wall thickening.  Mild bile duct dilatation.  No radiopaque common duct stones are visualized.  The liver, spleen, pancreas, adrenal glands, and kidneys are unremarkable.  The stomach and small bowel are not dilated. Tortuous and calcified abdominal aorta.  No retroperitoneal lymphadenopathy.  No free air or free fluid in the abdomen.  Pelvis:  There is diffuse thickening of the wall of the descending colon and sigmoid colon with pericolonic infiltration.  Changes are consistent with colitis.  Differential diagnosis would include infectious or inflammatory processes.  Ischemic colitis is felt less likely.  The uterus appears to be surgically absent.  Bladder wall is not thickened.  No significant pelvic lymphadenopathy.  The appendix is normal.  Degenerative changes in the lumbar spine. Compression of L1 post kyphoplasty.  Mild lumbar scoliosis.  IMPRESSION: Diffuse wall thickening and edema with pericolonic infiltration around the descending and sigmoid colon.  Changes are consistent with colitis.  Cholelithiasis without gallbladder wall  thickening. Mild intra and extrahepatic bile duct dilatation.  No common duct stones are visualized but non-radiopaque stones could be present.  Original Report Authenticated By: Marlon Pel, M.D.      1. Colitis   2. Cholelithiasis       MDM  Abdominal pain with right upper quadrant tenderness and known history of cholelithiasis. Pain most likely is biliary colic. It is interesting that her pain episodes of always occurred after eating something. I have reviewed her prior records and she did have a CT scan in 2009 which showed gallstones. She has not had any ultrasound studies. Laboratory workup will be done and she was be sent for a CT scan. Of note, she is anticoagulated on warfarin and INR will be checked.  CT confirms cholelithiasis but there are no signs of cholecystitis. There are are some inflammatory changes in the descending colon which probably accounts for her symptoms. However, I do feel she will need followup with surgery to evaluate whether she should have cholecystectomy based on the symptoms she has been having. Although WBC is elevated at 14.0, patient appears nontoxic and I feel it is safe for her to go home. She is sent home with prescriptions for Cipro ofloxacin and metronidazole and she is advised to contact a physician who is monitoring her warfarin as he may wish to adjust her dose as well she is on the antibiotics. She's given  a prescription for tramadol for pain and metoclopramide for nausea and she is to followup with her doctor in the next several days.     Dione Booze, MD 06/03/11 [phone]

## 2011-06-03 NOTE — Discharge Instructions (Signed)
The antibiotics you're on can affect your warfarin. These contact your Dr. who is monitoring her warfarin and that they know which antibiotics have been prescribed. They may have you adjust your warfarin dose. Return to the emergency department if your symptoms are getting worse.  Colitis Colitis is inflammation of the colon. Colitis can be a short-term or long-standing (chronic) illness. Crohn's disease and ulcerative colitis are 2 types of colitis which are chronic. They usually require lifelong treatment. CAUSES  There are many different causes of colitis, including:  Viruses.   Germs (bacteria).   Medicine reactions.  SYMPTOMS   Diarrhea.   Intestinal bleeding.   Pain.   Fever.   Throwing up (vomiting).   Tiredness (fatigue).   Weight loss.   Bowel blockage.  DIAGNOSIS  The diagnosis of colitis is based on examination and stool or blood tests. X-rays, CT scan, and colonoscopy may also be needed. TREATMENT  Treatment may include:  Fluids given through the vein (intravenously).   Bowel rest (nothing to eat or drink for a period of time).   Medicine for pain and diarrhea.   Medicines (antibiotics) that kill germs.   Cortisone medicines.   Surgery.  HOME CARE INSTRUCTIONS   Get plenty of rest.   Drink enough water and fluids to keep your urine clear or pale yellow.   Eat a well-balanced diet.   Call your caregiver for follow-up as recommended.  SEEK IMMEDIATE MEDICAL CARE IF:   You develop chills.   You have an oral temperature above 102 F (38.9 C), not controlled by medicine.   You have extreme weakness, fainting, or dehydration.   You have repeated vomiting.   You develop severe belly (abdominal) pain or are passing bloody or tarry stools.  MAKE SURE YOU:   Understand these instructions.   Will watch your condition.   Will get help right away if you are not doing well or get worse.  Document Released: 02/01/2004 Document Revised: 12/13/2010  Document Reviewed: 04/28/2009 [facility] Patient Information 2012 [place], [place].  Cholelithiasis Cholelithiasis (also called gallstones) is a form of gallbladder disease where gallstones form in your gallbladder. The gallbladder is a non-essential organ that stores bile made in the liver, which helps digest fats. Gallstones begin as small crystals and slowly grow into stones. Gallstone pain occurs when the gallbladder spasms, and a gallstone is blocking the duct. Pain can also occur when a stone passes out of the duct.  Women are more likely to develop gallstones than men. Other factors that increase the risk of gallbladder disease are:  Having multiple pregnancies. Physicians sometimes advise removing diseased gallbladders before future pregnancies.   Obesity.   Diets heavy in fried foods and fat.   Increasing age (older than 51).   Prolonged use of medications containing female hormones.   Diabetes mellitus.   Rapid weight loss.   Family history of gallstones (heredity).  SYMPTOMS  Feeling sick to your stomach (nauseous).   Abdominal pain.   Yellowing of the skin (jaundice).   Sudden pain. It may persist from several minutes to several hours.   Worsening pain with deep breathing or when jarred.   Fever.   Tenderness to the touch.  In some cases, when gallstones do not move into the bile duct, people have no pain or symptoms. These are called "silent" gallstones. TREATMENT In severe cases, emergency surgery may be required. HOME CARE INSTRUCTIONS   Only take over-the-counter or prescription medicines for pain, discomfort, or fever as directed  by your caregiver.   Follow a low-fat diet until seen again. Fat causes the gallbladder to contract, which can result in pain.   Follow up as instructed. Attacks are almost always recurrent and surgery is usually required for permanent treatment.  SEEK IMMEDIATE MEDICAL CARE IF:   Your pain increases and is not controlled by  medications.   You have an oral temperature above 102 F (38.9 C), not controlled by medication.   You develop nausea and vomiting.  MAKE SURE YOU:   Understand these instructions.   Will watch your condition.   Will get help right away if you are not doing well or get worse.  Document Released: 12/20/2004 Document Revised: 12/13/2010 Document Reviewed: 02/22/2010 [facility] Patient Information 2012 [place], [place].  Ciprofloxacin tablets What is this medicine? CIPROFLOXACIN (sip roe FLOX a sin) is a quinolone antibiotic. It is used to treat certain kinds of bacterial infections. It will not work for colds, flu, or other viral infections. This medicine may be used for other purposes; ask your health care provider or pharmacist if you have questions. What should I tell my health care provider before I take this medicine? They need to know if you have any of these conditions: -heart condition -joint problems -kidney disease -liver disease -myasthenia gravis -seizure disorder -an unusual or allergic reaction to ciprofloxacin, other antibiotics or medicines, foods, dyes, or preservatives -pregnant or trying to get pregnant -breast-feeding How should I use this medicine? Take this medicine by mouth with a glass of water. Follow the directions on the prescription label. Take your medicine at regular intervals. Do not take your medicine more often than directed. Take all of your medicine as directed even if you think your are better. Do not skip doses or stop your medicine early. You can take this medicine with food or on an empty stomach. It can be taken with a meal that contains dairy or calcium, but do not take it alone with a dairy product, like milk or yogurt or calcium-fortified juice. A special MedGuide will be given to you by the pharmacist with each prescription and refill. Be sure to read this information carefully each time. Talk to your pediatrician regarding the use of this  medicine in children. Special care may be needed. Overdosage: If you think you have taken too much of this medicine contact a poison control center or emergency room at once. NOTE: This medicine is only for you. Do not share this medicine with others. What if I miss a dose? If you miss a dose, take it as soon as you can. If it is almost time for your next dose, take only that dose. Do not take double or extra doses. What may interact with this medicine? Do not take this medicine with any of the following medications: -cisapride -droperidol -terfenadine -tizanidine This medicine may also interact with the following medications: -antacids -caffeine -cyclosporin -didanosine (ddI) buffered tablets or powder -medicines for diabetes -medicines for inflammation like ibuprofen, naproxen -methotrexate -multivitamins -omeprazole -phenytoin -probenecid -sucralfate -theophylline -warfarin This list may not describe all possible interactions. Give your health care provider a list of all the medicines, herbs, non-prescription drugs, or dietary supplements you use. Also tell them if you smoke, drink alcohol, or use illegal drugs. Some items may interact with your medicine. What should I watch for while using this medicine? Tell your doctor or health care professional if your symptoms do not improve. Do not treat diarrhea with over the counter products. Contact your doctor  if you have diarrhea that lasts more than 2 days or if it is severe and watery. You may get drowsy or dizzy. Do not drive, use machinery, or do anything that needs mental alertness until you know how this medicine affects you. Do not stand or sit up quickly, especially if you are an older patient. This reduces the risk of dizzy or fainting spells. This medicine can make you more sensitive to the sun. Keep out of the sun. If you cannot avoid being in the sun, wear protective clothing and use sunscreen. Do not use sun lamps or tanning  beds/booths. Avoid antacids, aluminum, calcium, iron, magnesium, and zinc products for 6 hours before and 2 hours after taking a dose of this medicine. What side effects may I notice from receiving this medicine? Side effects that you should report to your doctor or health care professional as soon as possible: -allergic reactions like skin rash, itching or hives, swelling of the face, lips, or tongue -breathing problems -confusion, nightmares or hallucinations -feeling faint or lightheaded, falls -irregular heartbeat -joint, muscle or tendon pain or swelling -pain or trouble passing urine -redness, blistering, peeling or loosening of the skin, including inside the mouth -seizure -unusual pain, numbness, tingling, or weakness Side effects that usually do not require medical attention (report to your doctor or health care professional if they continue or are bothersome): -diarrhea -nausea or stomach upset -white patches or sores in the mouth This list may not describe all possible side effects. Call your doctor for medical advice about side effects. You may report side effects to FDA at 1-800-FDA-1088. Where should I keep my medicine? Keep out of the reach of children. Store at room temperature below 30 degrees C (86 degrees F). Keep container tightly closed. Throw away any unused medicine after the expiration date. NOTE: This sheet is a summary. It may not cover all possible information. If you have questions about this medicine, talk to your doctor, pharmacist, or health care provider.  2012, [employer]. (03/13/2009 11:41:11 AM)  Metronidazole tablets or capsules What is this medicine? METRONIDAZOLE (me troe NI da zole) is an antiinfective. It is used to treat certain kinds of bacterial and protozoal infections. It will not work for colds, flu, or other viral infections. This medicine may be used for other purposes; ask your health care provider or pharmacist if you have  questions. What should I tell my health care provider before I take this medicine? They need to know if you have any of these conditions: -anemia or other blood disorders -disease of the nervous system -fungal or yeast infection -if you drink alcohol containing drinks -liver disease -seizures -an unusual or allergic reaction to metronidazole, or other medicines, foods, dyes, or preservatives -pregnant or trying to get pregnant -breast-feeding How should I use this medicine? Take this medicine by mouth with a full glass of water. Follow the directions on the prescription label. Take your medicine at regular intervals. Do not take your medicine more often than directed. Take all of your medicine as directed even if you think you are better. Do not skip doses or stop your medicine early. Talk to your pediatrician regarding the use of this medicine in children. Special care may be needed. Overdosage: If you think you have taken too much of this medicine contact a poison control center or emergency room at once. NOTE: This medicine is only for you. Do not share this medicine with others. What if I miss a dose? If you  miss a dose, take it as soon as you can. If it is almost time for your next dose, take only that dose. Do not take double or extra doses. What may interact with this medicine? Do not take this medicine with any of the following medications: -alcohol or any product that contains alcohol -amprenavir oral solution -disulfiram -paclitaxel injection -ritonavir oral solution -sertraline oral solution -sulfamethoxazole-trimethoprim injection This medicine may also interact with the following medications: -cimetidine -lithium -phenobarbital -phenytoin -warfarin This list may not describe all possible interactions. Give your health care provider a list of all the medicines, herbs, non-prescription drugs, or dietary supplements you use. Also tell them if you smoke, drink alcohol, or  use illegal drugs. Some items may interact with your medicine. What should I watch for while using this medicine? Tell your doctor or health care professional if your symptoms do not improve or if they get worse. You may get drowsy or dizzy. Do not drive, use machinery, or do anything that needs mental alertness until you know how this medicine affects you. Do not stand or sit up quickly, especially if you are an older patient. This reduces the risk of dizzy or fainting spells. Avoid alcoholic drinks while you are taking this medicine and for three days afterward. Alcohol may make you feel dizzy, sick, or flushed. If you are being treated for a sexually transmitted disease, avoid sexual contact until you have finished your treatment. Your sexual partner may also need treatment. What side effects may I notice from receiving this medicine? Side effects that you should report to your doctor or health care professional as soon as possible: -allergic reactions like skin rash or hives, swelling of the face, lips, or tongue -confusion, clumsiness -difficulty speaking -discolored or sore mouth -dizziness -fever, infection -numbness, tingling, pain or weakness in the hands or feet -trouble passing urine or change in the amount of urine -redness, blistering, peeling or loosening of the skin, including inside the mouth -seizures -unusually weak or tired -vaginal irritation, dryness, or discharge Side effects that usually do not require medical attention (report to your doctor or health care professional if they continue or are bothersome): -diarrhea -headache -irritability -metallic taste -nausea -stomach pain or cramps -trouble sleeping This list may not describe all possible side effects. Call your doctor for medical advice about side effects. You may report side effects to FDA at 1-800-FDA-1088. Where should I keep my medicine? Keep out of the reach of children. Store at room temperature below  25 degrees C (77 degrees F). Protect from light. Keep container tightly closed. Throw away any unused medicine after the expiration date. NOTE: This sheet is a summary. It may not cover all possible information. If you have questions about this medicine, talk to your doctor, pharmacist, or health care provider.  2012, [employer] Standard. (10/12/2007 10:37:11 AM)  Tramadol tablets What is this medicine? TRAMADOL (TRA ma dole) is a pain reliever. It is used to treat moderate to severe pain in adults. This medicine may be used for other purposes; ask your health care provider or pharmacist if you have questions. What should I tell my health care provider before I take this medicine? They need to know if you have any of these conditions: -brain tumor -depression -drug abuse or addiction -head injury -if you frequently drink alcohol containing drinks -kidney disease or trouble passing urine -liver disease -lung disease, asthma, or breathing problems -seizures or epilepsy -suicidal thoughts, plans, or attempt; a previous suicide attempt by you  or a family member -an unusual or allergic reaction to tramadol, codeine, other medicines, foods, dyes, or preservatives -pregnant or trying to get pregnant -breast-feeding How should I use this medicine? Take this medicine by mouth with a full glass of water. Follow the directions on the prescription label. If the medicine upsets your stomach, take it with food or milk. Do not take more medicine than you are told to take. Talk to your pediatrician regarding the use of this medicine in children. Special care may be needed. Overdosage: If you think you have taken too much of this medicine contact a poison control center or emergency room at once. NOTE: This medicine is only for you. Do not share this medicine with others. What if I miss a dose? If you miss a dose, take it as soon as you can. If it is almost time for your next dose, take only that  dose. Do not take double or extra doses. What may interact with this medicine? Do not take this medicine with any of the following medications: -MAOIs like Carbex, Eldepryl, Marplan, Nardil, and Parnate This medicine may also interact with the following medications: -alcohol or medicines that contain alcohol -antihistamines -benzodiazepines -bupropion -carbamazepine or oxcarbazepine -clozapine -cyclobenzaprine -digoxin -furazolidone -linezolid -medicines for depression, anxiety, or psychotic disturbances -medicines for migraine headache like almotriptan, eletriptan, frovatriptan, naratriptan, rizatriptan, sumatriptan, zolmitriptan -medicines for pain like pentazocine, buprenorphine, butorphanol, meperidine, nalbuphine, and propoxyphene -medicines for sleep -muscle relaxants -naltrexone -phenobarbital -phenothiazines like perphenazine, thioridazine, chlorpromazine, mesoridazine, fluphenazine, prochlorperazine, promazine, and trifluoperazine -procarbazine -warfarin This list may not describe all possible interactions. Give your health care provider a list of all the medicines, herbs, non-prescription drugs, or dietary supplements you use. Also tell them if you smoke, drink alcohol, or use illegal drugs. Some items may interact with your medicine. What should I watch for while using this medicine? Tell your doctor or health care professional if your pain does not go away, if it gets worse, or if you have new or a different type of pain. You may develop tolerance to the medicine. Tolerance means that you will need a higher dose of the medicine for pain relief. Tolerance is normal and is expected if you take this medicine for a long time. Do not suddenly stop taking your medicine because you may develop a severe reaction. Your body becomes used to the medicine. This does NOT mean you are addicted. Addiction is a behavior related to getting and using a drug for a non-medical reason. If you have  pain, you have a medical reason to take pain medicine. Your doctor will tell you how much medicine to take. If your doctor wants you to stop the medicine, the dose will be slowly lowered over time to avoid any side effects. You may get drowsy or dizzy. Do not drive, use machinery, or do anything that needs mental alertness until you know how this medicine affects you. Do not stand or sit up quickly, especially if you are an older patient. This reduces the risk of dizzy or fainting spells. Alcohol can increase or decrease the effects of this medicine. Avoid alcoholic drinks. You may have constipation. Try to have a bowel movement at least every 2 to 3 days. If you do not have a bowel movement for 3 days, call your doctor or health care professional. Your mouth may get dry. Chewing sugarless gum or sucking hard candy, and drinking plenty of water may help. Contact your doctor if the problem does not go  away or is severe. What side effects may I notice from receiving this medicine? Side effects that you should report to your doctor or health care professional as soon as possible: -allergic reactions like skin rash, itching or hives, swelling of the face, lips, or tongue -breathing difficulties, wheezing -confusion -itching -light headedness or fainting spells -redness, blistering, peeling or loosening of the skin, including inside the mouth -seizures Side effects that usually do not require medical attention (report to your doctor or health care professional if they continue or are bothersome): -constipation -dizziness -drowsiness -headache -nausea, vomiting This list may not describe all possible side effects. Call your doctor for medical advice about side effects. You may report side effects to FDA at 1-800-FDA-1088. Where should I keep my medicine? Keep out of the reach of children. Store at room temperature between 15 and 30 degrees C (59 and 86 degrees F). Keep container tightly closed. Throw  away any unused medicine after the expiration date. NOTE: This sheet is a summary. It may not cover all possible information. If you have questions about this medicine, talk to your doctor, pharmacist, or health care provider.  2012, [employer] Standard. (09/06/2009 11:55:44 AM)   Metoclopramide tablets What is this medicine? METOCLOPRAMIDE (met oh kloe PRA mide) is used to treat the symptoms of gastroesophageal reflux disease (GERD) like heartburn. It is also used to treat people with slow emptying of the stomach and intestinal tract. This medicine may be used for other purposes; ask your health care provider or pharmacist if you have questions. What should I tell my health care provider before I take this medicine? They need to know if you have any of these conditions: -breast cancer -depression -diabetes -heart failure -high blood pressure -kidney disease -liver disease -Parkinson's disease or a movement disorder -pheochromocytoma -seizures -stomach obstruction, bleeding, or perforation -an unusual or allergic reaction to metoclopramide, procainamide, sulfites, other medicines, foods, dyes, or preservatives -pregnant or trying to get pregnant -breast-feeding How should I use this medicine? Take this medicine by mouth with a glass of water. Follow the directions on the prescription label. Take this medicine on an empty stomach, about 30 minutes before eating. Take your doses at regular intervals. Do not take your medicine more often than directed. Do not stop taking except on the advice of your doctor or health care professional. A special MedGuide will be given to you by the pharmacist with each prescription and refill. Be sure to read this information carefully each time. Talk to your pediatrician regarding the use of this medicine in children. Special care may be needed. Overdosage: If you think you have taken too much of this medicine contact a poison control center or emergency  room at once. NOTE: This medicine is only for you. Do not share this medicine with others. What if I miss a dose? If you miss a dose, take it as soon as you can. If it is almost time for your next dose, take only that dose. Do not take double or extra doses. What may interact with this medicine? -acetaminophen -cyclosporine -digoxin -medicines for blood pressure -medicines for diabetes, including insulin -medicines for hay fever and other allergies -medicines for depression, especially an Monoamine Oxidase Inhibitor (MAOI) -medicines for Parkinson's disease, like levodopa -medicines for sleep or for pain -tetracycline This list may not describe all possible interactions. Give your health care provider a list of all the medicines, herbs, non-prescription drugs, or dietary supplements you use. Also tell them if you smoke, drink  alcohol, or use illegal drugs. Some items may interact with your medicine. What should I watch for while using this medicine? It may take a few weeks for your stomach condition to start to get better. However, do not take this medicine for longer than 12 weeks. The longer you take this medicine, and the more you take it, the greater your chances are of developing serious side effects. If you are an elderly patient, a female patient, or you have diabetes, you may be at an increased risk for side effects from this medicine. Contact your doctor immediately if you start having movements you cannot control such as lip smacking, rapid movements of the tongue, involuntary or uncontrollable movements of the eyes, head, arms and legs, or muscle twitches and spasms. Patients and their families should watch out for worsening depression or thoughts of suicide. Also watch out for any sudden or severe changes in feelings such as feeling anxious, agitated, panicky, irritable, hostile, aggressive, impulsive, severely restless, overly excited and hyperactive, or not being able to sleep. If  this happens, especially at the beginning of treatment or after a change in dose, call your doctor. Do not treat yourself for high fever. Ask your doctor or health care professional for advice. You may get drowsy or dizzy. Do not drive, use machinery, or do anything that needs mental alertness until you know how this drug affects you. Do not stand or sit up quickly, especially if you are an older patient. This reduces the risk of dizzy or fainting spells. Alcohol can make you more drowsy and dizzy. Avoid alcoholic drinks. What side effects may I notice from receiving this medicine? Side effects that you should report to your doctor or health care professional as soon as possible: -allergic reactions like skin rash, itching or hives, swelling of the face, lips, or tongue -abnormal production of milk in females -breast enlargement in both males and females -change in the way you walk -difficulty moving, speaking or swallowing -drooling, lip smacking, or rapid movements of the tongue -excessive sweating -fever -involuntary or uncontrollable movements of the eyes, head, arms and legs -irregular heartbeat or palpitations -muscle twitches and spasms -unusually weak or tired Side effects that usually do not require medical attention (report to your doctor or health care professional if they continue or are bothersome): -change in sex drive or performance -depressed mood -diarrhea -difficulty sleeping -headache -menstrual changes -restless or nervous This list may not describe all possible side effects. Call your doctor for medical advice about side effects. You may report side effects to FDA at 1-800-FDA-1088. Where should I keep my medicine? Keep out of the reach of children. Store at room temperature between 20 and 25 degrees C (68 and 77 degrees F). Protect from light. Keep container tightly closed. Throw away any unused medicine after the expiration date. NOTE: This sheet is a summary. It  may not cover all possible information. If you have questions about this medicine, talk to your doctor, pharmacist, or health care provider.  2012, Elsevier/Gold Standard. (08/19/2007 4:30:05 PM)

## 2011-06-03 NOTE — ED Notes (Signed)
Pt stats two big bowel movement around 12:00 yesterday. Pt states feels need to vomit but unable to. Pt states lower abdominal pain. Pt unable to eat or drink for yesterday.

## 2011-06-06 DIAGNOSIS — Z7901 Long term (current) use of anticoagulants: Secondary | ICD-10-CM | POA: Diagnosis not present

## 2011-06-06 DIAGNOSIS — K5289 Other specified noninfective gastroenteritis and colitis: Secondary | ICD-10-CM | POA: Diagnosis not present

## 2011-06-06 DIAGNOSIS — I4891 Unspecified atrial fibrillation: Secondary | ICD-10-CM | POA: Diagnosis not present

## 2011-06-06 DIAGNOSIS — K8020 Calculus of gallbladder without cholecystitis without obstruction: Secondary | ICD-10-CM | POA: Diagnosis not present

## 2011-06-07 ENCOUNTER — Telehealth (INDEPENDENT_AMBULATORY_CARE_PROVIDER_SITE_OTHER): Payer: Self-pay

## 2011-06-07 NOTE — Telephone Encounter (Signed)
The pt has an appointment for her gallbladder on 6/25 with  Dr Dwain Sarna.  She has seen him before.  She was in the ER Monday.  She has gallstones and they said her colon is inflamed.  She was put on antibiotics.  She is now having nausea and diarrhea.  I told her the diarrhea may be from the abx.  She would like to be seen sooner and be worked in or if you have a cancellation please call.  She lives close by.

## 2011-06-11 ENCOUNTER — Encounter (INDEPENDENT_AMBULATORY_CARE_PROVIDER_SITE_OTHER): Payer: Self-pay

## 2011-06-14 DIAGNOSIS — I119 Hypertensive heart disease without heart failure: Secondary | ICD-10-CM | POA: Diagnosis not present

## 2011-06-14 DIAGNOSIS — Z7901 Long term (current) use of anticoagulants: Secondary | ICD-10-CM | POA: Diagnosis not present

## 2011-06-14 DIAGNOSIS — I4891 Unspecified atrial fibrillation: Secondary | ICD-10-CM | POA: Diagnosis not present

## 2011-06-14 DIAGNOSIS — I951 Orthostatic hypotension: Secondary | ICD-10-CM | POA: Diagnosis not present

## 2011-06-18 ENCOUNTER — Ambulatory Visit (INDEPENDENT_AMBULATORY_CARE_PROVIDER_SITE_OTHER): Payer: Medicare Other | Admitting: General Surgery

## 2011-06-18 ENCOUNTER — Encounter (INDEPENDENT_AMBULATORY_CARE_PROVIDER_SITE_OTHER): Payer: Self-pay | Admitting: General Surgery

## 2011-06-18 VITALS — BP 108/64 | HR 81 | Temp 97.4°F | Resp 14 | Ht 62.0 in | Wt 139.6 lb

## 2011-06-18 DIAGNOSIS — K5289 Other specified noninfective gastroenteritis and colitis: Secondary | ICD-10-CM

## 2011-06-18 DIAGNOSIS — K529 Noninfective gastroenteritis and colitis, unspecified: Secondary | ICD-10-CM

## 2011-06-18 NOTE — Progress Notes (Signed)
Patient ID: Anna Farrell, female   DOB: April 08, 1927, 78 y.o.   MRN: [id number]  Chief Complaint  Patient presents with  . New Evaluation    Eval GB    HPI Anna Farrell is a 78 y.o. female.   HPI This is an 78 year old female who I saw initially in 2011 with some flank pain. She has had flank pain and right-sided pain at that time. She underwent evaluation with an ultrasound showed extensive cholelithiasis and a HIDA scan showed delayed filling of the gallbladder. I recommend a cholecystectomy at that time she did not want to proceed with that. She was to come see me after her vacation but did not return until 2 years later. Recently she had an episode where she was seen in the emergency room for lower quadrant abdominal pain. This was associated with some abdominal pain, vomiting, and loose stools. She underwent a CT scan which confirmed this. This also showed that she had cholelithiasis again. She is not really had a lot of symptoms from her gallbladder in the last 2 years. She took ciprofloxacin for a full course and stop the Flagyl but she is now much better. She still not regained her appetite. She does not have any abdominal pain any more. She is here to discuss cholecystectomy.  Past Medical History  Diagnosis Date  . Tachycardia-bradycardia syndrome   . Atrial fibrillation   . Vasovagal syncope   . Hypertension   . Hyperlipidemia   . Orthostatic hypotension   . Asthma   . Osteoporosis     Past Surgical History  Procedure Date  . Vesicovaginal fistula closure w/ tah 1981  . Pacemaker insertion 10/11/2006    No family history on file.  Social History History  Substance Use Topics  . Smoking status: Never Smoker   . Smokeless tobacco: Never Used  . Alcohol Use: No    Allergies  Allergen Reactions  . Codeine Nausea And Vomiting    Current Outpatient Prescriptions  Medication Sig Dispense Refill  . acetaminophen (TYLENOL) 500 MG tablet Take 500 mg by mouth every 6  (six) hours as needed. For pain      . albuterol (PROAIR HFA) 108 (90 BASE) MCG/ACT inhaler Inhale 2 puffs into the lungs every 6 (six) hours as needed for wheezing.  1 Inhaler  4  . calcium carbonate (TUMS - DOSED IN MG ELEMENTAL CALCIUM) 500 MG chewable tablet Chew 1 tablet by mouth once.      . cetirizine (ZYRTEC) 10 MG tablet Take 10 mg by mouth daily as needed. For allergy symptoms      . Cholecalciferol (VITAMIN D3) 1000 UNITS CAPS Take 1 capsule by mouth daily.       . cyanocobalamin ([employer]S VITAMIN B-12) 500 MCG tablet Take 500 mcg by mouth once a week. Takes on Wednesday      . HYDROcodone-acetaminophen (NORCO) 7.5-325 MG per tablet Take 1 tablet by mouth every 8 (eight) hours as needed. As needed for pain      . metoprolol succinate (TOPROL-XL) 25 MG 24 hr tablet Take 25 mg by mouth daily.       . Mometasone Furo-Formoterol Fum 200-5 MCG/ACT AERO Inhale 2 puffs into the lungs daily.      Marland Kitchen OVER THE COUNTER MEDICATION Take 1 tablet by mouth once. For diarrhea      . pravastatin (PRAVACHOL) 40 MG tablet Take 40 mg by mouth daily. Once daily      . traMADol (ULTRAM) 50  MG tablet Take 50 mg by mouth every 8 (eight) hours as needed. As needed for pain      . warfarin (COUMADIN) 3 MG tablet Take 3-4.5 mg by mouth See admin instructions. Takes one tablet Sunday, Monday, Tuesday, Thursday and Saturday. Takes 1.5 tablets (4.5mg ) Wednesday and Friday.        Review of Systems Review of Systems  Constitutional: Positive for unexpected weight change. Negative for fever and chills.  HENT: Negative for hearing loss, congestion, sore throat, trouble swallowing and voice change.   Eyes: Negative for visual disturbance.  Respiratory: Negative for cough and wheezing.   Cardiovascular: Negative for chest pain, palpitations and leg swelling.  Gastrointestinal: Negative for nausea, vomiting, abdominal pain, diarrhea, constipation, blood in stool, abdominal distention and anal bleeding.  Genitourinary:  Negative for hematuria, vaginal bleeding and difficulty urinating.  Musculoskeletal: Negative for arthralgias.  Skin: Negative for rash and wound.  Neurological: Negative for seizures, syncope and headaches.  Hematological: Negative for adenopathy. Does not bruise/bleed easily.  Psychiatric/Behavioral: Negative for confusion.    Blood pressure 108/64, pulse 81, temperature 97.4 F (36.3 C), temperature source Temporal, resp. rate 14, height 5\' 2"  (1.575 m), weight 139 lb 9.6 oz (63.322 kg).  Physical Exam Physical Exam  Vitals reviewed. Constitutional: She appears well-developed and well-nourished.  Eyes: No scleral icterus.  Abdominal: Soft. Normal appearance and bowel sounds are normal. There is no tenderness. No hernia.    Data Reviewed CT ABDOMEN AND PELVIS WITH CONTRAST  Technique: Multidetector CT imaging of the abdomen and pelvis was  performed following the standard protocol during bolus  administration of intravenous contrast.  Contrast: OMNIPAQUE IOHEXOL 300 MG/ML SOLN  Comparison: 02/02/2007 from [facility].  Findings: Slight fibrosis in the lung bases.  Cholelithiasis without gallbladder wall thickening. Mild bile duct  dilatation. No radiopaque common duct stones are visualized. The  liver, spleen, pancreas, adrenal glands, and kidneys are  unremarkable. The stomach and small bowel are not dilated.  Tortuous and calcified abdominal aorta. No retroperitoneal  lymphadenopathy. No free air or free fluid in the abdomen.  Pelvis: There is diffuse thickening of the wall of the descending  colon and sigmoid colon with pericolonic infiltration. Changes are  consistent with colitis. Differential diagnosis would include  infectious or inflammatory processes. Ischemic colitis is felt  less likely. The uterus appears to be surgically absent. Bladder  wall is not thickened. No significant pelvic lymphadenopathy. The  appendix is normal. Degenerative changes in the  lumbar spine.  Compression of L1 post kyphoplasty. Mild lumbar scoliosis.  IMPRESSION:  Diffuse wall thickening and edema with pericolonic infiltration  around the descending and sigmoid colon. Changes are consistent  with colitis. Cholelithiasis without gallbladder wall thickening.  Mild intra and extrahepatic bile duct dilatation. No common duct  stones are visualized but non-radiopaque stones could be present.    Assessment    Cholelithiaisis Colitis    Plan    I don't think her current symptoms are coming from her gallbladder. I do think she still needs a cholecystectomy. I'm a little bit more concerned about the colitis,the source of that and the weight loss she has had. This may all just be related to the antibiotics and the colitis. Her last colonoscopy was about 6 years ago. Before we proceed with a cholecystectomy I am going to have her go back and see Dr. Madilyn Fireman just to make sure that there is not any further evaluation that he thinks is necessary for her colon.  Kaelum Kissick 06/18/2011, 3:06 PM

## 2011-06-21 DIAGNOSIS — Z7901 Long term (current) use of anticoagulants: Secondary | ICD-10-CM | POA: Diagnosis not present

## 2011-06-21 DIAGNOSIS — I4891 Unspecified atrial fibrillation: Secondary | ICD-10-CM | POA: Diagnosis not present

## 2011-07-02 ENCOUNTER — Encounter (INDEPENDENT_AMBULATORY_CARE_PROVIDER_SITE_OTHER): Payer: Self-pay | Admitting: General Surgery

## 2011-07-04 DIAGNOSIS — Z7901 Long term (current) use of anticoagulants: Secondary | ICD-10-CM | POA: Diagnosis not present

## 2011-07-09 DIAGNOSIS — Z86010 Personal history of colonic polyps: Secondary | ICD-10-CM | POA: Diagnosis not present

## 2011-07-09 DIAGNOSIS — R932 Abnormal findings on diagnostic imaging of liver and biliary tract: Secondary | ICD-10-CM | POA: Diagnosis not present

## 2011-07-12 DIAGNOSIS — I4891 Unspecified atrial fibrillation: Secondary | ICD-10-CM | POA: Diagnosis not present

## 2011-07-12 DIAGNOSIS — Z7901 Long term (current) use of anticoagulants: Secondary | ICD-10-CM | POA: Diagnosis not present

## 2011-07-25 DIAGNOSIS — Z7901 Long term (current) use of anticoagulants: Secondary | ICD-10-CM | POA: Diagnosis not present

## 2011-07-25 DIAGNOSIS — I4891 Unspecified atrial fibrillation: Secondary | ICD-10-CM | POA: Diagnosis not present

## 2011-08-01 ENCOUNTER — Other Ambulatory Visit: Payer: Self-pay | Admitting: Gastroenterology

## 2011-08-01 DIAGNOSIS — D126 Benign neoplasm of colon, unspecified: Secondary | ICD-10-CM | POA: Diagnosis not present

## 2011-08-01 DIAGNOSIS — R933 Abnormal findings on diagnostic imaging of other parts of digestive tract: Secondary | ICD-10-CM | POA: Diagnosis not present

## 2011-08-01 DIAGNOSIS — Z86010 Personal history of colonic polyps: Secondary | ICD-10-CM | POA: Diagnosis not present

## 2011-08-21 DIAGNOSIS — Z7901 Long term (current) use of anticoagulants: Secondary | ICD-10-CM | POA: Diagnosis not present

## 2011-08-21 DIAGNOSIS — I4891 Unspecified atrial fibrillation: Secondary | ICD-10-CM | POA: Diagnosis not present

## 2011-09-02 ENCOUNTER — Encounter (INDEPENDENT_AMBULATORY_CARE_PROVIDER_SITE_OTHER): Payer: Self-pay | Admitting: General Surgery

## 2011-09-02 ENCOUNTER — Ambulatory Visit (INDEPENDENT_AMBULATORY_CARE_PROVIDER_SITE_OTHER): Payer: Medicare Other | Admitting: General Surgery

## 2011-09-02 VITALS — BP 142/78 | HR 76 | Resp 16 | Ht 62.0 in | Wt 138.0 lb

## 2011-09-02 DIAGNOSIS — K8020 Calculus of gallbladder without cholecystitis without obstruction: Secondary | ICD-10-CM

## 2011-09-02 DIAGNOSIS — I119 Hypertensive heart disease without heart failure: Secondary | ICD-10-CM | POA: Diagnosis not present

## 2011-09-02 DIAGNOSIS — E7800 Pure hypercholesterolemia, unspecified: Secondary | ICD-10-CM | POA: Diagnosis not present

## 2011-09-02 DIAGNOSIS — Z950 Presence of cardiac pacemaker: Secondary | ICD-10-CM | POA: Diagnosis not present

## 2011-09-02 DIAGNOSIS — Z7901 Long term (current) use of anticoagulants: Secondary | ICD-10-CM | POA: Diagnosis not present

## 2011-09-02 DIAGNOSIS — I4891 Unspecified atrial fibrillation: Secondary | ICD-10-CM | POA: Diagnosis not present

## 2011-09-02 NOTE — Progress Notes (Signed)
Patient ID: Anna Farrell, female   DOB: Jun 24, 1927, 78 y.o.   MRN: [id number]  Chief Complaint  Patient presents with  . Other    Recheck GB/ Discuss sx    HPI Anna Farrell is a 77 y.o. female.   HPI This is an 78 year old female I've seen several times in the past for symptomatic cholelithiasis. Last time I saw her she had colitis. This is now resolved she has undergone a colonoscopy where she had several benign polyps removed. She returns today with a colitis completely resolved. She's doing well overall now. She comes back in today to discuss cholecystectomy. Past Medical History  Diagnosis Date  . Tachycardia-bradycardia syndrome   . Atrial fibrillation   . Vasovagal syncope   . Hypertension   . Hyperlipidemia   . Orthostatic hypotension   . Asthma   . Osteoporosis     Past Surgical History  Procedure Date  . Vesicovaginal fistula closure w/ tah 1981  . Pacemaker insertion 10/11/2006    History reviewed. No pertinent family history.  Social History History  Substance Use Topics  . Smoking status: Never Smoker   . Smokeless tobacco: Never Used  . Alcohol Use: No    Allergies  Allergen Reactions  . Codeine Nausea And Vomiting    Current Outpatient Prescriptions  Medication Sig Dispense Refill  . acetaminophen (TYLENOL) 500 MG tablet Take 500 mg by mouth every 6 (six) hours as needed. For pain      . calcium carbonate (TUMS - DOSED IN MG ELEMENTAL CALCIUM) 500 MG chewable tablet Chew 1 tablet by mouth once.      . cetirizine (ZYRTEC) 10 MG tablet Take 10 mg by mouth daily as needed. For allergy symptoms      . Cholecalciferol (VITAMIN D3) 1000 UNITS CAPS Take 1 capsule by mouth daily.       . cyanocobalamin ([employer]S VITAMIN B-12) 500 MCG tablet Take 500 mcg by mouth once a week. Takes on Wednesday      . HYDROcodone-acetaminophen (NORCO) 7.5-325 MG per tablet Take 1 tablet by mouth every 8 (eight) hours as needed. As needed for pain      . metoprolol succinate  (TOPROL-XL) 25 MG 24 hr tablet Take 25 mg by mouth daily.       . Mometasone Furo-Formoterol Fum 200-5 MCG/ACT AERO Inhale 2 puffs into the lungs daily.      Marland Kitchen OVER THE COUNTER MEDICATION Take 1 tablet by mouth once. For diarrhea      . pravastatin (PRAVACHOL) 40 MG tablet Take 40 mg by mouth daily. Once daily      . traMADol (ULTRAM) 50 MG tablet Take 50 mg by mouth every 8 (eight) hours as needed. As needed for pain      . warfarin (COUMADIN) 3 MG tablet Take 3-4.5 mg by mouth See admin instructions. Takes one tablet Sunday, Monday, Tuesday, Thursday and Saturday. Takes 1.5 tablets (4.5mg ) Wednesday and Friday.      Marland Kitchen albuterol (PROAIR HFA) 108 (90 BASE) MCG/ACT inhaler Inhale 2 puffs into the lungs every 6 (six) hours as needed for wheezing.  1 Inhaler  4    Review of Systems Review of Systems  Constitutional: Negative for fever, chills and unexpected weight change.  HENT: Negative for hearing loss, congestion, sore throat, trouble swallowing and voice change.   Eyes: Negative for visual disturbance.  Respiratory: Negative for cough and wheezing.   Cardiovascular: Negative for chest pain, palpitations and leg swelling.  Gastrointestinal:  Negative for nausea, vomiting, abdominal pain, diarrhea, constipation, blood in stool, abdominal distention and anal bleeding.  Genitourinary: Negative for hematuria, vaginal bleeding and difficulty urinating.  Musculoskeletal: Negative for arthralgias.  Skin: Negative for rash and wound.  Neurological: Negative for seizures, syncope and headaches.  Hematological: Negative for adenopathy. Does not bruise/bleed easily.  Psychiatric/Behavioral: Negative for confusion.    Blood pressure 142/78, pulse 76, resp. rate 16, height 5\' 2"  (1.575 m), weight 138 lb (62.596 kg).  Physical Exam Physical Exam  Vitals reviewed. Constitutional: She appears well-developed and well-nourished.  Eyes: No scleral icterus.  Neck: Neck supple.  Cardiovascular: Normal  rate, regular rhythm and normal heart sounds.   Pulmonary/Chest: Effort normal and breath sounds normal. She has no wheezes. She has no rales.  Abdominal: Soft. Normal appearance and bowel sounds are normal. There is no tenderness.  Lymphadenopathy:    She has no cervical adenopathy.    Data Reviewed U/S reviewed  Assessment    Symptomatic cholelithiasis    Plan    I do think we should do a laparoscopic cholecystectomy now. She has been having symptoms in the past and certainly has worrisome radiologic studies. She is here today to discuss this. I discussed the procedure in detail.  The patient was given Agricultural engineer.  We discussed the risks and benefits of a laparoscopic cholecystectomy and possible cholangiogram including, but not limited to bleeding, infection, injury to surrounding structures such as the intestine or liver, bile leak, retained gallstones, need to convert to an open procedure, prolonged diarrhea, blood clots such as  DVT, common bile duct injury, anesthesia risks, and possible need for additional procedures.  The likelihood of improvement in symptoms and return to the patient's normal status is good. We discussed the typical post-operative recovery course.        Anna Farrell 09/02/2011, 9:59 AM

## 2011-09-03 ENCOUNTER — Encounter (INDEPENDENT_AMBULATORY_CARE_PROVIDER_SITE_OTHER): Payer: Self-pay

## 2011-09-03 ENCOUNTER — Telehealth (INDEPENDENT_AMBULATORY_CARE_PROVIDER_SITE_OTHER): Payer: Self-pay

## 2011-09-03 NOTE — Telephone Encounter (Signed)
Pt notified that we received her cardiac clearance from Dr Armanda Magic with recommendations of stopping her Coumadin 5days before her surgery. I advised our surgery schedulers will call pt to schedule over the phone. The pt understands.

## 2011-09-11 ENCOUNTER — Encounter (HOSPITAL_COMMUNITY): Payer: Self-pay | Admitting: Pharmacy Technician

## 2011-09-11 DIAGNOSIS — Z950 Presence of cardiac pacemaker: Secondary | ICD-10-CM | POA: Diagnosis not present

## 2011-09-12 ENCOUNTER — Telehealth (INDEPENDENT_AMBULATORY_CARE_PROVIDER_SITE_OTHER): Payer: Self-pay

## 2011-09-12 ENCOUNTER — Other Ambulatory Visit (INDEPENDENT_AMBULATORY_CARE_PROVIDER_SITE_OTHER): Payer: Self-pay | Admitting: General Surgery

## 2011-09-12 ENCOUNTER — Encounter (HOSPITAL_COMMUNITY): Payer: Self-pay

## 2011-09-12 ENCOUNTER — Telehealth (INDEPENDENT_AMBULATORY_CARE_PROVIDER_SITE_OTHER): Payer: Self-pay | Admitting: General Surgery

## 2011-09-12 ENCOUNTER — Encounter (HOSPITAL_COMMUNITY)
Admission: RE | Admit: 2011-09-12 | Discharge: 2011-09-12 | Disposition: A | Discharge status: Home / Self Care | Payer: Medicare Other | Source: Ambulatory Visit | Attending: General Surgery | Admitting: General Surgery

## 2011-09-12 ENCOUNTER — Ambulatory Visit (HOSPITAL_COMMUNITY)
Admission: RE | Admit: 2011-09-12 | Discharge: 2011-09-12 | Disposition: A | Discharge status: Home / Self Care | Payer: Medicare Other | Source: Ambulatory Visit | Attending: General Surgery | Admitting: General Surgery

## 2011-09-12 DIAGNOSIS — Z01810 Encounter for preprocedural cardiovascular examination: Secondary | ICD-10-CM | POA: Insufficient documentation

## 2011-09-12 DIAGNOSIS — I517 Cardiomegaly: Secondary | ICD-10-CM | POA: Insufficient documentation

## 2011-09-12 DIAGNOSIS — Z01812 Encounter for preprocedural laboratory examination: Secondary | ICD-10-CM | POA: Insufficient documentation

## 2011-09-12 DIAGNOSIS — Z950 Presence of cardiac pacemaker: Secondary | ICD-10-CM | POA: Diagnosis not present

## 2011-09-12 DIAGNOSIS — I10 Essential (primary) hypertension: Secondary | ICD-10-CM | POA: Diagnosis not present

## 2011-09-12 DIAGNOSIS — K8020 Calculus of gallbladder without cholecystitis without obstruction: Secondary | ICD-10-CM | POA: Diagnosis not present

## 2011-09-12 DIAGNOSIS — N390 Urinary tract infection, site not specified: Secondary | ICD-10-CM

## 2011-09-12 DIAGNOSIS — T148XXA Other injury of unspecified body region, initial encounter: Secondary | ICD-10-CM | POA: Diagnosis not present

## 2011-09-12 DIAGNOSIS — X58XXXA Exposure to other specified factors, initial encounter: Secondary | ICD-10-CM | POA: Insufficient documentation

## 2011-09-12 HISTORY — DX: Low back pain: M54.5

## 2011-09-12 HISTORY — DX: Presence of cardiac pacemaker: Z95.0

## 2011-09-12 HISTORY — DX: Other chronic pain: G89.29

## 2011-09-12 HISTORY — DX: Noninfective gastroenteritis and colitis, unspecified: K52.9

## 2011-09-12 LAB — URINALYSIS, ROUTINE W REFLEX MICROSCOPIC
Bilirubin Urine: NEGATIVE
Glucose, UA: NEGATIVE mg/dL
Hgb urine dipstick: NEGATIVE
Specific Gravity, Urine: 1.018 (ref 1.005–1.030)
Urobilinogen, UA: 0.2 mg/dL (ref 0.0–1.0)

## 2011-09-12 LAB — COMPREHENSIVE METABOLIC PANEL
ALT: 9 U/L (ref 0–35)
Albumin: 3.5 g/dL (ref 3.5–5.2)
Alkaline Phosphatase: 75 U/L (ref 39–117)
BUN: 15 mg/dL (ref 6–23)
Chloride: 99 mEq/L (ref 96–112)
Glucose, Bld: 115 mg/dL — ABNORMAL HIGH (ref 70–99)
Potassium: 3.6 mEq/L (ref 3.5–5.1)
Total Bilirubin: 0.4 mg/dL (ref 0.3–1.2)
Total Protein: 6.6 g/dL (ref 6.0–8.3)

## 2011-09-12 LAB — CBC WITH DIFFERENTIAL/PLATELET
Basophils Absolute: 0.1 10*3/uL (ref 0.0–0.1)
Eosinophils Absolute: 0.1 10*3/uL (ref 0.0–0.7)
Eosinophils Relative: 2 % (ref 0–5)
HCT: 33.2 % — ABNORMAL LOW (ref 36.0–46.0)
Lymphocytes Relative: 19 % (ref 12–46)
MCH: 23.8 pg — ABNORMAL LOW (ref 26.0–34.0)
MCHC: 30.1 g/dL (ref 30.0–36.0)
MCV: 79 fL (ref 78.0–100.0)
Monocytes Absolute: 0.7 10*3/uL (ref 0.1–1.0)
RDW: 16.8 % — ABNORMAL HIGH (ref 11.5–15.5)
WBC: 5.6 10*3/uL (ref 4.0–10.5)

## 2011-09-12 LAB — SURGICAL PCR SCREEN
MRSA, PCR: NEGATIVE
Staphylococcus aureus: NEGATIVE

## 2011-09-12 LAB — PROTIME-INR: Prothrombin Time: 19.5 seconds — ABNORMAL HIGH (ref 11.6–15.2)

## 2011-09-12 MED ORDER — SULFAMETHOXAZOLE-TRIMETHOPRIM 800-160 MG PO TABS: 2.0000 | ORAL_TABLET | Freq: Two times a day (BID) | ORAL | Status: AC

## 2011-09-12 NOTE — Pre-Procedure Instructions (Signed)
Requested interrogation from 09/11/11 at Dr Malachy Mood  At 1145.  When EKG red by machine states ? Pacemaker failure, pacer spikes noted, repeated 4 min later AND DID NOT READ AS THIS.  SPOKE WITH kATIE, DEVICE NURSE AT DR Malachy Mood PRIOR TO DISCHARGE FROM  [facility] WHO STATES SHE HAS HAD 2 NORMAL DEVICE CHECKS DONE and is OK for her to leave. Florentina Addison stated she would show faxed copies of EKG from today to Dr Mayford Knife and they will call patient   if device needs to be checked- pt asymptomatic throughout

## 2011-09-12 NOTE — Patient Instructions (Signed)
20 TEAL RABEN  09/12/2011   Your procedure is scheduled on:  09/17/11  Tuesday    Surgery [id number]  Report to [facility] at     0515  AM.  Call this number if you have problems the morning of surgery: [phone]     Or PST   [id number]  [facility]   Remember:   Do not eat food or drink any fluids :After Midnight.  Monday NIGHT    Take these medicines the morning of surgery with A SIP OF WATER:       METOPROLOL, AMLODIPINE BRING INHALER WITH YOU TO HOSPITAL  Do not wear jewelry, make-up or nail polish.  Do not wear lotions, powders, or perfumes. You may wear deodorant.  Do not shave 48 hours prior to surgery.  Do not bring valuables to the hospital.  Contacts, dentures or bridgework may not be worn into surgery.  Leave suitcase in the car. After surgery it may be brought to your room.  For patients admitted to the hospital, checkout time is 11:00 AM the day of discharge.   Patients discharged the day of surgery will not be allowed to drive home.  Name and phone number of your driver:    Daughter, son or daughter in law                                                                  Special Instructions: CHG Shower Use Special Wash: 1/2 bottle night before surgery and 1/2 bottle morning of surgery. REGULAR SOAP FACE AND PRIVATES              LADIES- NO SHAVING 48 HOURS BEFORE USING BETASEPT SOAP.                Please read over the following fact sheets that you were given: MRSA Information

## 2011-09-12 NOTE — Telephone Encounter (Signed)
Called Anna Farrell to check to see if she was having any problems with urinary symptoms b/c her preop UA came back showing a UTI. I advised Anna Farrell that Dr Dwain Sarna will want to call her in a Rx to take till her sx next week. We will e prescribe the Rx to [facility].

## 2011-09-12 NOTE — Telephone Encounter (Signed)
TRISTA FROM [facility] CALLED TO SEE IF PT WAS STILL TAKING HER WARFARIN SINCE SHE WAS STARTED ON BACTRIM. I CALLED PT AND SHE STATED THAT SHE STOPPED HER WARFARIN TODAY IN PREPARATION FOR SURGERY NEXT WEEK. I NOTIFIED TRISTA HAT PT STOPPED HER WARFARIN TODAY/ PH-[facility]- [phone]/GY

## 2011-09-12 NOTE — Pre-Procedure Instructions (Signed)
Katie from Dr Malachy Mood office called and verified that Dr Mayford Knife had reviewed EKG that I faxed to office today- states was OK-   Abnormal urinalysis noted and according to note CCS has been addressed today

## 2011-09-12 NOTE — Pre-Procedure Instructions (Signed)
LOV Dr Mayford Knife x 2  with clearance on chart, last eccho 2008, last stress 2008, cath 2009- placed back of chart with EKG

## 2011-09-13 NOTE — Pre-Procedure Instructions (Signed)
Last interrogation report from 09/11/11 placed on chart

## 2011-09-16 DIAGNOSIS — Z7901 Long term (current) use of anticoagulants: Secondary | ICD-10-CM | POA: Diagnosis not present

## 2011-09-16 DIAGNOSIS — I4891 Unspecified atrial fibrillation: Secondary | ICD-10-CM | POA: Diagnosis not present

## 2011-09-17 ENCOUNTER — Encounter (HOSPITAL_COMMUNITY): Payer: Self-pay | Admitting: Anesthesiology

## 2011-09-17 ENCOUNTER — Encounter (HOSPITAL_COMMUNITY): Payer: Self-pay | Admitting: *Deleted

## 2011-09-17 ENCOUNTER — Ambulatory Visit (HOSPITAL_COMMUNITY): Payer: Medicare Other

## 2011-09-17 ENCOUNTER — Observation Stay (HOSPITAL_COMMUNITY)
Admission: RE | Admit: 2011-09-17 | Discharge: 2011-09-18 | Disposition: A | Discharge status: Home / Self Care | Payer: Medicare Other | Source: Ambulatory Visit | Attending: General Surgery | Admitting: General Surgery

## 2011-09-17 ENCOUNTER — Encounter (HOSPITAL_COMMUNITY)
Admission: RE | Disposition: A | Discharge status: Home / Self Care | Payer: Self-pay | Source: Ambulatory Visit | Attending: General Surgery

## 2011-09-17 ENCOUNTER — Ambulatory Visit (HOSPITAL_COMMUNITY): Payer: Medicare Other | Admitting: Anesthesiology

## 2011-09-17 DIAGNOSIS — Z7901 Long term (current) use of anticoagulants: Secondary | ICD-10-CM | POA: Diagnosis not present

## 2011-09-17 DIAGNOSIS — I4891 Unspecified atrial fibrillation: Secondary | ICD-10-CM | POA: Diagnosis not present

## 2011-09-17 DIAGNOSIS — K8010 Calculus of gallbladder with chronic cholecystitis without obstruction: Principal | ICD-10-CM | POA: Insufficient documentation

## 2011-09-17 DIAGNOSIS — I10 Essential (primary) hypertension: Secondary | ICD-10-CM | POA: Diagnosis not present

## 2011-09-17 DIAGNOSIS — K8020 Calculus of gallbladder without cholecystitis without obstruction: Secondary | ICD-10-CM | POA: Diagnosis not present

## 2011-09-17 DIAGNOSIS — J45909 Unspecified asthma, uncomplicated: Secondary | ICD-10-CM | POA: Diagnosis not present

## 2011-09-17 DIAGNOSIS — Z950 Presence of cardiac pacemaker: Secondary | ICD-10-CM | POA: Insufficient documentation

## 2011-09-17 HISTORY — PX: CHOLECYSTECTOMY: SHX55

## 2011-09-17 SURGERY — LAPAROSCOPIC CHOLECYSTECTOMY
Anesthesia: General | Site: Abdomen | Wound class: Clean Contaminated

## 2011-09-17 MED ORDER — BUPIVACAINE-EPINEPHRINE (PF) 0.25% -1:200000 IJ SOLN
INTRAMUSCULAR | Status: DC | PRN
  Administered 2011-09-17: 20 mL

## 2011-09-17 MED ORDER — MEPERIDINE HCL 50 MG/ML IJ SOLN: 6.2500 mg | INTRAMUSCULAR | Status: DC | PRN

## 2011-09-17 MED ORDER — NEOSTIGMINE METHYLSULFATE 1 MG/ML IJ SOLN
INTRAMUSCULAR | Status: DC | PRN
  Administered 2011-09-17: 4 mg via INTRAVENOUS

## 2011-09-17 MED ORDER — PROMETHAZINE HCL 25 MG/ML IJ SOLN: 6.2500 mg | INTRAMUSCULAR | Status: DC | PRN

## 2011-09-17 MED ORDER — FENTANYL CITRATE 0.05 MG/ML IJ SOLN
INTRAMUSCULAR | Status: DC | PRN
  Administered 2011-09-17 (×5): 50 ug via INTRAVENOUS

## 2011-09-17 MED ORDER — AMLODIPINE BESYLATE 5 MG PO TABS
5.0000 mg | ORAL_TABLET | Freq: Every day | ORAL | Status: DC
  Administered 2011-09-18: 5 mg via ORAL
  Filled 2011-09-17: qty 1

## 2011-09-17 MED ORDER — CEFOXITIN SODIUM 1 G IV SOLR
2.0000 g | INTRAVENOUS | Status: AC
  Administered 2011-09-17: 2 g via INTRAVENOUS

## 2011-09-17 MED ORDER — ACETAMINOPHEN 325 MG PO TABS
650.0000 mg | ORAL_TABLET | Freq: Four times a day (QID) | ORAL | Status: DC | PRN
  Administered 2011-09-18: 650 mg via ORAL
  Filled 2011-09-17: qty 2

## 2011-09-17 MED ORDER — GLYCOPYRROLATE 0.2 MG/ML IJ SOLN
INTRAMUSCULAR | Status: DC | PRN
  Administered 2011-09-17: .5 mg via INTRAVENOUS

## 2011-09-17 MED ORDER — METOPROLOL SUCCINATE ER 25 MG PO TB24
25.0000 mg | ORAL_TABLET | Freq: Every day | ORAL | Status: DC
  Administered 2011-09-18: 25 mg via ORAL
  Filled 2011-09-17: qty 1

## 2011-09-17 MED ORDER — SUCCINYLCHOLINE CHLORIDE 20 MG/ML IJ SOLN
INTRAMUSCULAR | Status: DC | PRN
  Administered 2011-09-17: 80 mg via INTRAVENOUS

## 2011-09-17 MED ORDER — ONDANSETRON HCL 4 MG/2ML IJ SOLN: 4.0000 mg | Freq: Four times a day (QID) | INTRAMUSCULAR | Status: DC | PRN

## 2011-09-17 MED ORDER — FENTANYL CITRATE 0.05 MG/ML IJ SOLN
25.0000 ug | INTRAMUSCULAR | Status: DC | PRN
  Administered 2011-09-17 (×2): 25 ug via INTRAVENOUS

## 2011-09-17 MED ORDER — OXYCODONE HCL 5 MG PO TABS
5.0000 mg | ORAL_TABLET | ORAL | Status: DC | PRN
  Administered 2011-09-18 (×2): 5 mg via ORAL
  Filled 2011-09-17 (×2): qty 1

## 2011-09-17 MED ORDER — ACETAMINOPHEN 650 MG RE SUPP: 650.0000 mg | Freq: Four times a day (QID) | RECTAL | Status: DC | PRN

## 2011-09-17 MED ORDER — CISATRACURIUM BESYLATE (PF) 10 MG/5ML IV SOLN
INTRAVENOUS | Status: DC | PRN
  Administered 2011-09-17: 2 mg via INTRAVENOUS
  Administered 2011-09-17: 5 mg via INTRAVENOUS

## 2011-09-17 MED ORDER — DEXAMETHASONE SODIUM PHOSPHATE 10 MG/ML IJ SOLN
INTRAMUSCULAR | Status: DC | PRN
  Administered 2011-09-17: 10 mg via INTRAVENOUS

## 2011-09-17 MED ORDER — PHENYLEPHRINE HCL 10 MG/ML IJ SOLN
INTRAMUSCULAR | Status: DC | PRN
  Administered 2011-09-17: 80 ug via INTRAVENOUS

## 2011-09-17 MED ORDER — PROPOFOL 10 MG/ML IV BOLUS
INTRAVENOUS | Status: DC | PRN
  Administered 2011-09-17: 100 mg via INTRAVENOUS

## 2011-09-17 MED ORDER — LACTATED RINGERS IV SOLN: INTRAVENOUS | Status: DC

## 2011-09-17 MED ORDER — ACETAMINOPHEN 10 MG/ML IV SOLN
INTRAVENOUS | Status: DC | PRN
  Administered 2011-09-17: 1000 mg via INTRAVENOUS

## 2011-09-17 MED ORDER — LORATADINE 10 MG PO TABS
10.0000 mg | ORAL_TABLET | Freq: Every day | ORAL | Status: DC
  Administered 2011-09-18: 10 mg via ORAL
  Filled 2011-09-17 (×2): qty 1

## 2011-09-17 MED ORDER — SODIUM CHLORIDE 0.9 % IV SOLN
INTRAVENOUS | Status: DC
  Administered 2011-09-17: 1000 mL via INTRAVENOUS

## 2011-09-17 MED ORDER — MORPHINE SULFATE 2 MG/ML IJ SOLN
2.0000 mg | INTRAMUSCULAR | Status: DC | PRN
  Administered 2011-09-17 (×3): 2 mg via INTRAVENOUS
  Filled 2011-09-17 (×3): qty 1

## 2011-09-17 MED ORDER — ONDANSETRON HCL 4 MG/2ML IJ SOLN
INTRAMUSCULAR | Status: DC | PRN
  Administered 2011-09-17: 4 mg via INTRAVENOUS

## 2011-09-17 MED ORDER — LACTATED RINGERS IV SOLN
INTRAVENOUS | Status: DC | PRN
  Administered 2011-09-17: 1 via INTRAVENOUS

## 2011-09-17 MED ORDER — IOHEXOL 300 MG/ML  SOLN
INTRAMUSCULAR | Status: DC | PRN
  Administered 2011-09-17: 50 mL via INTRAVENOUS

## 2011-09-17 MED ORDER — ALPRAZOLAM 0.25 MG PO TABS: 0.2500 mg | ORAL_TABLET | Freq: Every evening | ORAL | Status: DC | PRN

## 2011-09-17 MED ORDER — CISATRACURIUM BESYLATE (PF) 10 MG/5ML IV SOLN: INTRAVENOUS | Status: DC | PRN

## 2011-09-17 MED ORDER — LACTATED RINGERS IV SOLN
INTRAVENOUS | Status: DC | PRN
  Administered 2011-09-17: 07:00:00 via INTRAVENOUS

## 2011-09-17 MED ORDER — EPHEDRINE SULFATE 50 MG/ML IJ SOLN
INTRAMUSCULAR | Status: DC | PRN
  Administered 2011-09-17 (×2): 5 mg via INTRAVENOUS

## 2011-09-17 MED ORDER — ALBUTEROL SULFATE HFA 108 (90 BASE) MCG/ACT IN AERS
2.0000 | INHALATION_SPRAY | Freq: Four times a day (QID) | RESPIRATORY_TRACT | Status: DC | PRN
  Filled 2011-09-17: qty 6.7

## 2011-09-17 SURGICAL SUPPLY — 37 items
APL SKNCLS STERI-STRIP NONHPOA (GAUZE/BANDAGES/DRESSINGS)
APPLIER CLIP 5 13 M/L LIGAMAX5 (MISCELLANEOUS) ×3
APPLIER CLIP ROT 10 11.4 M/L (STAPLE) ×3
BENZOIN TINCTURE PRP APPL 2/3 (GAUZE/BANDAGES/DRESSINGS) IMPLANT
CANISTER SUCTION 2500CC (MISCELLANEOUS) ×3 IMPLANT
CLIP APPLIE 5 13 M/L LIGAMAX5 (MISCELLANEOUS) ×2 IMPLANT
CLIP APPLIE ROT 10 11.4 M/L (STAPLE) ×2 IMPLANT
CLOTH BEACON ORANGE TIMEOUT ST (SAFETY) ×3 IMPLANT
COVER MAYO STAND STRL (DRAPES) ×3 IMPLANT
DECANTER SPIKE VIAL GLASS SM (MISCELLANEOUS) ×3 IMPLANT
DERMABOND ADVANCED (GAUZE/BANDAGES/DRESSINGS) ×2
DERMABOND ADVANCED .7 DNX12 (GAUZE/BANDAGES/DRESSINGS) ×4 IMPLANT
DRAPE C-ARM 42X72 X-RAY (DRAPES) ×3 IMPLANT
DRAPE LAPAROSCOPIC ABDOMINAL (DRAPES) ×3 IMPLANT
ELECT REM PT RETURN 9FT ADLT (ELECTROSURGICAL) ×3
ELECTRODE REM PT RTRN 9FT ADLT (ELECTROSURGICAL) ×2 IMPLANT
GLOVE BIO SURGEON STRL SZ7 (GLOVE) ×3 IMPLANT
GLOVE BIOGEL PI IND STRL 7.5 (GLOVE) ×2 IMPLANT
GLOVE BIOGEL PI INDICATOR 7.5 (GLOVE) ×1
GOWN PREVENTION PLUS LG XLONG (DISPOSABLE) ×6 IMPLANT
GOWN PREVENTION PLUS XLARGE (GOWN DISPOSABLE) ×3 IMPLANT
GOWN STRL NON-REIN LRG LVL3 (GOWN DISPOSABLE) ×3 IMPLANT
GOWN STRL REIN XL XLG (GOWN DISPOSABLE) ×3 IMPLANT
KIT BASIN OR (CUSTOM PROCEDURE TRAY) ×3 IMPLANT
NS IRRIG 1000ML POUR BTL (IV SOLUTION) ×3 IMPLANT
POUCH SPECIMEN RETRIEVAL 10MM (ENDOMECHANICALS) ×3 IMPLANT
SET CHOLANGIOGRAPH MIX (MISCELLANEOUS) IMPLANT
SET IRRIG TUBING LAPAROSCOPIC (IRRIGATION / IRRIGATOR) ×3 IMPLANT
SOLUTION ANTI FOG 6CC (MISCELLANEOUS) ×3 IMPLANT
STRIP CLOSURE SKIN 1/2X4 (GAUZE/BANDAGES/DRESSINGS) IMPLANT
SUT MNCRL AB 4-0 PS2 18 (SUTURE) ×3 IMPLANT
TOWEL OR 17X26 10 PK STRL BLUE (TOWEL DISPOSABLE) ×6 IMPLANT
TRAY LAP CHOLE (CUSTOM PROCEDURE TRAY) ×3 IMPLANT
TROCAR BLADELESS OPT 5 75 (ENDOMECHANICALS) ×9 IMPLANT
TROCAR XCEL BLUNT TIP 100MML (ENDOMECHANICALS) ×3 IMPLANT
TROCAR XCEL NON-BLD 11X100MML (ENDOMECHANICALS) IMPLANT
TUBING INSUFFLATION 10FT LAP (TUBING) ×3 IMPLANT

## 2011-09-17 NOTE — Progress Notes (Signed)
Patient had Pt/INR drawn at Dr. Gloris Manchester Turner's office on 09/16/11. Resulted at 1.2. Lab is not noted in EPIC, placed on paper in chart. Dr. Okey Dupre ok with these results and no repeat necessary.

## 2011-09-17 NOTE — Anesthesia Preprocedure Evaluation (Addendum)
Anesthesia Evaluation  Patient identified by MRN, date of birth, ID band Patient awake    Reviewed: Allergy & Precautions, H&P , NPO status , Patient's Chart, lab work & pertinent test results  Airway Mallampati: II TM Distance: >3 FB Neck ROM: Full    Dental No notable dental hx.    Pulmonary neg pulmonary ROS, asthma ,  breath sounds clear to auscultation  Pulmonary exam normal       Cardiovascular hypertension, Pt. on medications negative cardio ROS  + dysrhythmias Atrial Fibrillation + pacemaker Rhythm:Regular Rate:Normal     Neuro/Psych negative neurological ROS  negative psych ROS   GI/Hepatic negative GI ROS, Neg liver ROS,   Endo/Other  negative endocrine ROS  Renal/GU negative Renal ROS  negative genitourinary   Musculoskeletal negative musculoskeletal ROS (+)   Abdominal   Peds negative pediatric ROS (+)  Hematology negative hematology ROS (+)   Anesthesia Other Findings   Reproductive/Obstetrics negative OB ROS                           Anesthesia Physical Anesthesia Plan  ASA: III  Anesthesia Plan: General   Post-op Pain Management:    Induction: Intravenous  Airway Management Planned: Oral ETT  Additional Equipment:   Intra-op Plan:   Post-operative Plan: Extubation in OR  Informed Consent: I have reviewed the patients History and Physical, chart, labs and discussed the procedure including the risks, benefits and alternatives for the proposed anesthesia with the patient or authorized representative who has indicated his/her understanding and acceptance.   Dental advisory given  Plan Discussed with: CRNA  Anesthesia Plan Comments:         Anesthesia Quick Evaluation

## 2011-09-17 NOTE — Op Note (Signed)
Preoperative diagnosis: Chronic cholecystitis Postoperative diagnosis: Same as above Procedure: Laparoscopic cholecystectomy Surgeon: Dr. Harden Mo Anesthesia: Gen. Estimated blood loss: Minimal Complications: None Drains: None Specimens: Gallbladder and contents to pathology Sponge needle count was correct x2 in the operation Disposition to recovery stable  Indications: This is an 78 year old female who I saw a couple of years ago for symptoms related to gallstones I recommended a cholecystectomy at that time. She did not want to proceed at that time. I then saw her again with continued symptoms and we discussed a laparoscopic cholecystectomy and the risks and benefits associated with that.  Procedure: After informed consent was obtained the patient was taken to the operating room. She was administered cefoxitin. Sequential compression devices were placed on her legs. She was then placed under general endotracheal anesthesia. Her abdomen was prepped and draped in the standard sterile surgical fashion. A surgical timeout was performed.  I infiltrated Marcaine below her umbilicus. I made a vertical incision. I grasped her fashion and entered it sharply. Her peritoneum was entered bluntly with a Kelly clamp. I then placed a 0 Vicryl pursestring suture through the fascia. A Hassan trocar was introduced and the abdomen was then insufflated to 15 mmHg pressure. I then placed 3 further 5 mm trocars in the epigastrium and right upper quadrant. Her gallbladder was then retracted cephalad. She was noted to have a lot of adhesions from omentum and duodenum. These were taken down with a combination of cautery and scissor dissection. The gallbladder was then retracted cephalad and lateral. I then obtained a critical view of safety with some difficulty due to the amount of scar in her triangle. I clipped the anterior and posterior branch of the cystic artery separately and divided these. I then clipped the  duct and divided the duct. The gallbladder was then removed from the liver bed without difficulty. I was difficult to grasp and I needed to evacuate bile as it was very tense.I put the gallbladder in an Endo Catch bag and removed from the umbilicus. I did make this incision a little bit bigger due to the fact that she had so many stones. I then obtained hemostasis. Irrigation was performed. I then removed my Hassan trocar. I tied my pursestring down. There was still a little bit of a defect I placed 2 further 0 Vicryl sutures. This completely obliterated the defect. I then removed the trocars and desufflated the abdomen. I closed the incisions with 4 Monocryl Dermabond. She tolerated this well and was transferred to recovery stable.

## 2011-09-17 NOTE — Transfer of Care (Signed)
Immediate Anesthesia Transfer of Care Note  Patient: Anna Farrell  Procedure(s) Performed: Procedure(s) (LRB) with comments: LAPAROSCOPIC CHOLECYSTECTOMY ()  Patient Location: PACU  Anesthesia Type: General  Level of Consciousness: awake, alert , oriented and patient cooperative  Airway & Oxygen Therapy: Patient Spontanous Breathing and Patient connected to face mask oxygen  Post-op Assessment: Report given to PACU RN and Post -op Vital signs reviewed and stable  Post vital signs: Reviewed and stable  Complications: No apparent anesthesia complications

## 2011-09-17 NOTE — Preoperative (Signed)
Beta Blockers   Reason not to administer Beta Blockers:Not Applicable 

## 2011-09-17 NOTE — Interval H&P Note (Signed)
History and Physical Interval Note:  09/17/2011 7:31 AM  Anna Farrell  has presented today for surgery, with the diagnosis of gallstones  The various methods of treatment have been discussed with the patient and family. After consideration of risks, benefits and other options for treatment, the patient has consented to  Procedure(s) (LRB) with comments: LAPAROSCOPIC CHOLECYSTECTOMY WITH INTRAOPERATIVE CHOLANGIOGRAM (N/A) as a surgical intervention .  The patient's history has been reviewed, patient examined, no change in status, stable for surgery.  I have reviewed the patient's chart and labs.  Questions were answered to the patient's satisfaction.     [name]

## 2011-09-17 NOTE — H&P (View-Only) (Signed)
Patient ID: Anna Farrell, female   DOB: 11/08/1927, 78 y.o.   MRN: [id number]  Chief Complaint  Patient presents with  . Other    Recheck GB/ Discuss sx    HPI Anna Farrell is a 78 y.o. female.   HPI This is an 78-year-old female I've seen several times in the past for symptomatic cholelithiasis. Last time I saw her she had colitis. This is now resolved she has undergone a colonoscopy where she had several benign polyps removed. She returns today with a colitis completely resolved. She's doing well overall now. She comes back in today to discuss cholecystectomy. Past Medical History  Diagnosis Date  . Tachycardia-bradycardia syndrome   . Atrial fibrillation   . Vasovagal syncope   . Hypertension   . Hyperlipidemia   . Orthostatic hypotension   . Asthma   . Osteoporosis     Past Surgical History  Procedure Date  . Vesicovaginal fistula closure w/ tah 1981  . Pacemaker insertion 10/11/2006    History reviewed. No pertinent family history.  Social History History  Substance Use Topics  . Smoking status: Never Smoker   . Smokeless tobacco: Never Used  . Alcohol Use: No    Allergies  Allergen Reactions  . Codeine Nausea And Vomiting    Current Outpatient Prescriptions  Medication Sig Dispense Refill  . acetaminophen (TYLENOL) 500 MG tablet Take 500 mg by mouth every 6 (six) hours as needed. For pain      . calcium carbonate (TUMS - DOSED IN MG ELEMENTAL CALCIUM) 500 MG chewable tablet Chew 1 tablet by mouth once.      . cetirizine (ZYRTEC) 10 MG tablet Take 10 mg by mouth daily as needed. For allergy symptoms      . Cholecalciferol (VITAMIN D3) 1000 UNITS CAPS Take 1 capsule by mouth daily.       . cyanocobalamin (CVS VITAMIN B-12) 500 MCG tablet Take 500 mcg by mouth once a week. Takes on Wednesday      . HYDROcodone-acetaminophen (NORCO) 7.5-325 MG per tablet Take 1 tablet by mouth every 8 (eight) hours as needed. As needed for pain      . metoprolol succinate  (TOPROL-XL) 25 MG 24 hr tablet Take 25 mg by mouth daily.       . Mometasone Furo-Formoterol Fum 200-5 MCG/ACT AERO Inhale 2 puffs into the lungs daily.      . OVER THE COUNTER MEDICATION Take 1 tablet by mouth once. For diarrhea      . pravastatin (PRAVACHOL) 40 MG tablet Take 40 mg by mouth daily. Once daily      . traMADol (ULTRAM) 50 MG tablet Take 50 mg by mouth every 8 (eight) hours as needed. As needed for pain      . warfarin (COUMADIN) 3 MG tablet Take 3-4.5 mg by mouth See admin instructions. Takes one tablet Sunday, Monday, Tuesday, Thursday and Saturday. Takes 1.5 tablets (4.5mg) Wednesday and Friday.      . albuterol (PROAIR HFA) 108 (90 BASE) MCG/ACT inhaler Inhale 2 puffs into the lungs every 6 (six) hours as needed for wheezing.  1 Inhaler  4    Review of Systems Review of Systems  Constitutional: Negative for fever, chills and unexpected weight change.  HENT: Negative for hearing loss, congestion, sore throat, trouble swallowing and voice change.   Eyes: Negative for visual disturbance.  Respiratory: Negative for cough and wheezing.   Cardiovascular: Negative for chest pain, palpitations and leg swelling.  Gastrointestinal:   Negative for nausea, vomiting, abdominal pain, diarrhea, constipation, blood in stool, abdominal distention and anal bleeding.  Genitourinary: Negative for hematuria, vaginal bleeding and difficulty urinating.  Musculoskeletal: Negative for arthralgias.  Skin: Negative for rash and wound.  Neurological: Negative for seizures, syncope and headaches.  Hematological: Negative for adenopathy. Does not bruise/bleed easily.  Psychiatric/Behavioral: Negative for confusion.    Blood pressure 142/78, pulse 76, resp. rate 16, height 5' 2" (1.575 m), weight 138 lb (62.596 kg).  Physical Exam Physical Exam  Vitals reviewed. Constitutional: She appears well-developed and well-nourished.  Eyes: No scleral icterus.  Neck: Neck supple.  Cardiovascular: Normal  rate, regular rhythm and normal heart sounds.   Pulmonary/Chest: Effort normal and breath sounds normal. She has no wheezes. She has no rales.  Abdominal: Soft. Normal appearance and bowel sounds are normal. There is no tenderness.  Lymphadenopathy:    She has no cervical adenopathy.    Data Reviewed U/S reviewed  Assessment    Symptomatic cholelithiasis    Plan    I do think we should do a laparoscopic cholecystectomy now. She has been having symptoms in the past and certainly has worrisome radiologic studies. She is here today to discuss this. I discussed the procedure in detail.  The patient was given educational material.  We discussed the risks and benefits of a laparoscopic cholecystectomy and possible cholangiogram including, but not limited to bleeding, infection, injury to surrounding structures such as the intestine or liver, bile leak, retained gallstones, need to convert to an open procedure, prolonged diarrhea, blood clots such as  DVT, common bile duct injury, anesthesia risks, and possible need for additional procedures.  The likelihood of improvement in symptoms and return to the patient's normal status is good. We discussed the typical post-operative recovery course.        Tascha Casares 09/02/2011, 9:59 AM    

## 2011-09-17 NOTE — Anesthesia Postprocedure Evaluation (Signed)
  Anesthesia Post-op Note  Patient: Anna Farrell  Procedure(s) Performed: Procedure(s) (LRB): LAPAROSCOPIC CHOLECYSTECTOMY ()  Patient Location: PACU  Anesthesia Type: General  Level of Consciousness: awake and alert   Airway and Oxygen Therapy: Patient Spontanous Breathing  Post-op Pain: mild  Post-op Assessment: Post-op Vital signs reviewed, Patient's Cardiovascular Status Stable, Respiratory Function Stable, Patent Airway and No signs of Nausea or vomiting  Post-op Vital Signs: stable  Complications: No apparent anesthesia complications

## 2011-09-18 ENCOUNTER — Encounter (HOSPITAL_COMMUNITY): Payer: Self-pay | Admitting: General Surgery

## 2011-09-18 MED ORDER — OXYCODONE HCL 5 MG PO TABS: 5.0000 mg | ORAL_TABLET | ORAL | Status: AC | PRN

## 2011-09-18 MED ORDER — INFLUENZA VIRUS VACC SPLIT PF IM SUSP
0.5000 mL | Freq: Once | INTRAMUSCULAR | Status: AC
  Administered 2011-09-18: 0.5 mL via INTRAMUSCULAR
  Filled 2011-09-18: qty 0.5

## 2011-09-18 MED FILL — Bupivacaine HCl Preservative Free (PF) Inj 0.25%: INTRAMUSCULAR | Qty: 30 | Status: AC

## 2011-09-18 NOTE — Discharge Summary (Signed)
Physician Discharge Summary  Patient ID: Anna Farrell MRN: [id number] DOB/AGE: 1927-06-05 78 y.o.  Admit date: 09/17/2011 Discharge date: 09/18/2011  Admission Diagnoses: Symptomatic cholelithiasis Atrial fibrillation  Discharge Diagnoses:  S/p lap chole saa Discharged Condition: good  Hospital Course: 30 yof who presented with symptomatic cholelithiasis.  She was taken off coumadin and underwent lap chole that was uneventful.  She has done well overnight and will go home today.  She is tolerating oral medication and diet.  Able to ambulate.  She will restart coumadin tomorrow  Consults: None  Significant Diagnostic Studies: none  Treatments: surgery: lap chole  Disposition: 01-Home or Self Care     Medication List     As of 09/18/2011 10:07 AM    TAKE these medications         albuterol 108 (90 BASE) MCG/ACT inhaler   Commonly known as: PROVENTIL HFA;VENTOLIN HFA   Inhale 2 puffs into the lungs every 6 (six) hours as needed for wheezing.      ALPRAZolam 0.25 MG tablet   Commonly known as: XANAX   Take 0.25 mg by mouth at bedtime as needed. For restless legs      amLODipine 5 MG tablet   Commonly known as: NORVASC   Take 5 mg by mouth daily.      cetirizine 10 MG tablet   Commonly known as: ZYRTEC   Take 10 mg by mouth daily as needed. For allergy symptoms      CVS VITAMIN B-12 500 MCG tablet   Generic drug: cyanocobalamin   Take 500 mcg by mouth once a week. Takes on Wednesday      metoprolol succinate 25 MG 24 hr tablet   Commonly known as: TOPROL-XL   Take 25 mg by mouth daily.      oxyCODONE 5 MG immediate release tablet   Commonly known as: Oxy IR/ROXICODONE   Take 1 tablet (5 mg total) by mouth every 4 (four) hours as needed.      pravastatin 40 MG tablet   Commonly known as: PRAVACHOL   Take 40 mg by mouth daily. Once daily      sulfamethoxazole-trimethoprim 800-160 MG per tablet   Commonly known as: BACTRIM DS,SEPTRA DS   Take 2 tablets by  mouth 2 (two) times daily.      traMADol 50 MG tablet   Commonly known as: ULTRAM   Take 50 mg by mouth every 8 (eight) hours as needed. As needed for pain      TYLENOL 500 MG tablet   Generic drug: acetaminophen   Take 500 mg by mouth every 6 (six) hours as needed. For pain      Vitamin D3 1000 UNITS Caps   Take 2 capsules by mouth daily.      warfarin 3 MG tablet   Commonly known as: COUMADIN   Take 3 mg by mouth See admin instructions.           Follow-up Information    Follow up with [facility], [place]. In 3 weeks.   Contact information:   [address] [place] [address] [phone]          Signed: Emelia Loron 09/18/2011, 10:07 AM

## 2011-09-18 NOTE — Care Management Note (Signed)
    Page 1 of 1   09/18/2011     12:29:31 PM   CARE MANAGEMENT NOTE 09/18/2011  Patient:  Anna Farrell, Anna Farrell   Account Number:  [id number]  Date Initiated:  09/18/2011  Documentation initiated by:  Lorenda Ishihara  Subjective/Objective Assessment:   78 yo female admitted s/p lap chole. PTA lived at home alone.     Action/Plan:   Anticipated DC Date:  09/18/2011   Anticipated DC Plan:  HOME/SELF CARE      DC Planning Services  CM consult      Choice offered to / List presented to:             Status of service:  Completed, signed off Medicare Important Message given?   (If response is "NO", the following Medicare IM given date fields will be blank) Date Medicare IM given:   Date Additional Medicare IM given:    Discharge Disposition:  HOME/SELF CARE  Per UR Regulation:  Reviewed for med. necessity/level of care/duration of stay  If discussed at Long Length of Stay Meetings, dates discussed:    Comments:

## 2011-10-02 DIAGNOSIS — I4891 Unspecified atrial fibrillation: Secondary | ICD-10-CM | POA: Diagnosis not present

## 2011-10-02 DIAGNOSIS — Z7901 Long term (current) use of anticoagulants: Secondary | ICD-10-CM | POA: Diagnosis not present

## 2011-10-18 ENCOUNTER — Ambulatory Visit (INDEPENDENT_AMBULATORY_CARE_PROVIDER_SITE_OTHER): Payer: Medicare Other | Admitting: General Surgery

## 2011-10-18 ENCOUNTER — Encounter (HOSPITAL_COMMUNITY): Payer: Self-pay | Admitting: Nurse Practitioner

## 2011-10-18 ENCOUNTER — Encounter (INDEPENDENT_AMBULATORY_CARE_PROVIDER_SITE_OTHER): Payer: Self-pay | Admitting: General Surgery

## 2011-10-18 ENCOUNTER — Emergency Department (HOSPITAL_COMMUNITY)
Admission: EM | Admit: 2011-10-18 | Discharge: 2011-10-18 | Discharge status: Against Medical Advice | Payer: Medicare Other | Attending: Emergency Medicine | Admitting: Emergency Medicine

## 2011-10-18 VITALS — BP 84/48 | HR 80 | Temp 98.4°F | Resp 14 | Ht 62.0 in | Wt 136.0 lb

## 2011-10-18 DIAGNOSIS — Z9089 Acquired absence of other organs: Secondary | ICD-10-CM | POA: Insufficient documentation

## 2011-10-18 DIAGNOSIS — Z09 Encounter for follow-up examination after completed treatment for conditions other than malignant neoplasm: Secondary | ICD-10-CM

## 2011-10-18 DIAGNOSIS — I959 Hypotension, unspecified: Secondary | ICD-10-CM | POA: Insufficient documentation

## 2011-10-18 LAB — CBC
HCT: 34.5 % — ABNORMAL LOW (ref 36.0–46.0)
Hemoglobin: 10.6 g/dL — ABNORMAL LOW (ref 12.0–15.0)
MCH: 23.9 pg — ABNORMAL LOW (ref 26.0–34.0)
MCHC: 30.7 g/dL (ref 30.0–36.0)
MCV: 77.7 fL — ABNORMAL LOW (ref 78.0–100.0)

## 2011-10-18 LAB — BASIC METABOLIC PANEL
BUN: 17 mg/dL (ref 6–23)
Calcium: 10.2 mg/dL (ref 8.4–10.5)
GFR calc non Af Amer: 77 mL/min — ABNORMAL LOW (ref >90)
Glucose, Bld: 137 mg/dL — ABNORMAL HIGH (ref 70–99)

## 2011-10-18 NOTE — ED Notes (Signed)
Pt reports she was at [facility] to see dr Dwain Sarna for f/u post cholecystectomy, in office BP was low and he wanted her to come to ED for labs and possible fluids. Pt denies any complaints and states  "soemtimes my blood pressure gets low." A&Ox4, resp e/u

## 2011-10-18 NOTE — Progress Notes (Addendum)
Subjective:     Patient ID: Anna Farrell, female   DOB: [birth year], 78 y.o.   MRN: [id number]  HPI This is an 78 year old with multiple medical problems who I did a laparoscopic cholecystectomy. She has done well from that until the last couple of days. Really this morning she woke up and she's feeling lightheaded especially when standing. She has been taken off her medication. She otherwise has slowly been regaining her appetite. She is having normal bowel movements. She denies any sort of abdominal pain or diarrhea.  Review of Systems     Objective:   Physical Exam Abdomen is soft and nontender with well healing incisions    Assessment:     Status post laparoscopic cholecystectomy with pathology showing chronic cholecystitis and cholelithiasis Orthostatic hypotension    Plan:     She has really done well from her gallbladder. I don't think what is going on now is related to the surgery.  She says that she has had these issues before with hypotension. Previously she has been seen in Dr. Norris Cross office and has received some fluids. I gave her office a call and discuss this with her. Dr. Mayford Knife recommends she goes to the emergency room today. I have passed this on to Mrs. Manuelito and she will go to the emergency room at [facility] immediately following her visit.

## 2011-10-18 NOTE — ED Notes (Signed)
Pt left, attempted to get pt to sign LWBS, but did signature pad did not work. Pt understands risks of leaving.

## 2011-11-05 DIAGNOSIS — Z7901 Long term (current) use of anticoagulants: Secondary | ICD-10-CM | POA: Diagnosis not present

## 2011-11-05 DIAGNOSIS — I4891 Unspecified atrial fibrillation: Secondary | ICD-10-CM | POA: Diagnosis not present

## 2011-12-03 DIAGNOSIS — Z7901 Long term (current) use of anticoagulants: Secondary | ICD-10-CM | POA: Diagnosis not present

## 2011-12-03 DIAGNOSIS — I4891 Unspecified atrial fibrillation: Secondary | ICD-10-CM | POA: Diagnosis not present

## 2011-12-16 DIAGNOSIS — I495 Sick sinus syndrome: Secondary | ICD-10-CM | POA: Diagnosis not present

## 2011-12-16 DIAGNOSIS — Z950 Presence of cardiac pacemaker: Secondary | ICD-10-CM | POA: Diagnosis not present

## 2011-12-19 DIAGNOSIS — I4891 Unspecified atrial fibrillation: Secondary | ICD-10-CM | POA: Diagnosis not present

## 2011-12-19 DIAGNOSIS — I951 Orthostatic hypotension: Secondary | ICD-10-CM | POA: Diagnosis not present

## 2011-12-19 DIAGNOSIS — I119 Hypertensive heart disease without heart failure: Secondary | ICD-10-CM | POA: Diagnosis not present

## 2012-01-06 DIAGNOSIS — I4891 Unspecified atrial fibrillation: Secondary | ICD-10-CM | POA: Diagnosis not present

## 2012-01-06 DIAGNOSIS — Z7901 Long term (current) use of anticoagulants: Secondary | ICD-10-CM | POA: Diagnosis not present

## 2012-01-20 DIAGNOSIS — I4891 Unspecified atrial fibrillation: Secondary | ICD-10-CM | POA: Diagnosis not present

## 2012-01-20 DIAGNOSIS — Z7901 Long term (current) use of anticoagulants: Secondary | ICD-10-CM | POA: Diagnosis not present

## 2012-02-10 DIAGNOSIS — I4891 Unspecified atrial fibrillation: Secondary | ICD-10-CM | POA: Diagnosis not present

## 2012-02-10 DIAGNOSIS — Z7901 Long term (current) use of anticoagulants: Secondary | ICD-10-CM | POA: Diagnosis not present

## 2012-03-11 DIAGNOSIS — Z7901 Long term (current) use of anticoagulants: Secondary | ICD-10-CM | POA: Diagnosis not present

## 2012-03-11 DIAGNOSIS — I4891 Unspecified atrial fibrillation: Secondary | ICD-10-CM | POA: Diagnosis not present

## 2012-04-08 DIAGNOSIS — Z7901 Long term (current) use of anticoagulants: Secondary | ICD-10-CM | POA: Diagnosis not present

## 2012-04-08 DIAGNOSIS — Z950 Presence of cardiac pacemaker: Secondary | ICD-10-CM | POA: Diagnosis not present

## 2012-04-08 DIAGNOSIS — I4891 Unspecified atrial fibrillation: Secondary | ICD-10-CM | POA: Diagnosis not present

## 2012-04-30 DIAGNOSIS — I4891 Unspecified atrial fibrillation: Secondary | ICD-10-CM | POA: Diagnosis not present

## 2012-04-30 DIAGNOSIS — J309 Allergic rhinitis, unspecified: Secondary | ICD-10-CM | POA: Diagnosis not present

## 2012-04-30 DIAGNOSIS — E7800 Pure hypercholesterolemia, unspecified: Secondary | ICD-10-CM | POA: Diagnosis not present

## 2012-04-30 DIAGNOSIS — I119 Hypertensive heart disease without heart failure: Secondary | ICD-10-CM | POA: Diagnosis not present

## 2012-04-30 DIAGNOSIS — M159 Polyosteoarthritis, unspecified: Secondary | ICD-10-CM | POA: Diagnosis not present

## 2012-05-13 DIAGNOSIS — I4891 Unspecified atrial fibrillation: Secondary | ICD-10-CM | POA: Diagnosis not present

## 2012-05-13 DIAGNOSIS — Z7901 Long term (current) use of anticoagulants: Secondary | ICD-10-CM | POA: Diagnosis not present

## 2012-05-21 DIAGNOSIS — M545 Low back pain, unspecified: Secondary | ICD-10-CM | POA: Diagnosis not present

## 2012-05-21 DIAGNOSIS — M47817 Spondylosis without myelopathy or radiculopathy, lumbosacral region: Secondary | ICD-10-CM | POA: Diagnosis not present

## 2012-06-10 DIAGNOSIS — M545 Low back pain, unspecified: Secondary | ICD-10-CM | POA: Diagnosis not present

## 2012-06-10 DIAGNOSIS — M5380 Other specified dorsopathies, site unspecified: Secondary | ICD-10-CM | POA: Diagnosis not present

## 2012-06-18 DIAGNOSIS — I951 Orthostatic hypotension: Secondary | ICD-10-CM | POA: Diagnosis not present

## 2012-06-18 DIAGNOSIS — I4891 Unspecified atrial fibrillation: Secondary | ICD-10-CM | POA: Diagnosis not present

## 2012-06-18 DIAGNOSIS — I119 Hypertensive heart disease without heart failure: Secondary | ICD-10-CM | POA: Diagnosis not present

## 2012-06-18 DIAGNOSIS — Z7901 Long term (current) use of anticoagulants: Secondary | ICD-10-CM | POA: Diagnosis not present

## 2012-06-25 DIAGNOSIS — M545 Low back pain, unspecified: Secondary | ICD-10-CM | POA: Diagnosis not present

## 2012-06-25 DIAGNOSIS — M47817 Spondylosis without myelopathy or radiculopathy, lumbosacral region: Secondary | ICD-10-CM | POA: Diagnosis not present

## 2012-06-26 DIAGNOSIS — I4891 Unspecified atrial fibrillation: Secondary | ICD-10-CM | POA: Diagnosis not present

## 2012-06-26 DIAGNOSIS — I951 Orthostatic hypotension: Secondary | ICD-10-CM | POA: Diagnosis not present

## 2012-06-26 DIAGNOSIS — Z7901 Long term (current) use of anticoagulants: Secondary | ICD-10-CM | POA: Diagnosis not present

## 2012-06-29 DIAGNOSIS — I4891 Unspecified atrial fibrillation: Secondary | ICD-10-CM | POA: Diagnosis not present

## 2012-06-29 DIAGNOSIS — I951 Orthostatic hypotension: Secondary | ICD-10-CM | POA: Diagnosis not present

## 2012-06-29 DIAGNOSIS — I119 Hypertensive heart disease without heart failure: Secondary | ICD-10-CM | POA: Diagnosis not present

## 2012-07-03 ENCOUNTER — Other Ambulatory Visit: Payer: Self-pay | Admitting: Family Medicine

## 2012-07-03 DIAGNOSIS — D509 Iron deficiency anemia, unspecified: Secondary | ICD-10-CM | POA: Diagnosis not present

## 2012-07-03 DIAGNOSIS — Z1231 Encounter for screening mammogram for malignant neoplasm of breast: Secondary | ICD-10-CM

## 2012-07-08 DIAGNOSIS — I4891 Unspecified atrial fibrillation: Secondary | ICD-10-CM | POA: Diagnosis not present

## 2012-07-08 DIAGNOSIS — I951 Orthostatic hypotension: Secondary | ICD-10-CM | POA: Diagnosis not present

## 2012-07-08 DIAGNOSIS — I119 Hypertensive heart disease without heart failure: Secondary | ICD-10-CM | POA: Diagnosis not present

## 2012-07-13 DIAGNOSIS — Z7901 Long term (current) use of anticoagulants: Secondary | ICD-10-CM | POA: Diagnosis not present

## 2012-07-13 DIAGNOSIS — I4891 Unspecified atrial fibrillation: Secondary | ICD-10-CM | POA: Diagnosis not present

## 2012-07-14 ENCOUNTER — Ambulatory Visit
Admission: RE | Admit: 2012-07-14 | Discharge: 2012-07-14 | Disposition: A | Discharge status: Home / Self Care | Payer: Medicare Other | Source: Ambulatory Visit | Attending: Family Medicine | Admitting: Family Medicine

## 2012-07-14 DIAGNOSIS — Z1231 Encounter for screening mammogram for malignant neoplasm of breast: Secondary | ICD-10-CM | POA: Diagnosis not present

## 2012-07-27 DIAGNOSIS — I495 Sick sinus syndrome: Secondary | ICD-10-CM | POA: Diagnosis not present

## 2012-07-29 DIAGNOSIS — M47817 Spondylosis without myelopathy or radiculopathy, lumbosacral region: Secondary | ICD-10-CM | POA: Diagnosis not present

## 2012-07-29 DIAGNOSIS — M545 Low back pain, unspecified: Secondary | ICD-10-CM | POA: Diagnosis not present

## 2012-07-30 DIAGNOSIS — I951 Orthostatic hypotension: Secondary | ICD-10-CM | POA: Diagnosis not present

## 2012-07-30 DIAGNOSIS — I119 Hypertensive heart disease without heart failure: Secondary | ICD-10-CM | POA: Diagnosis not present

## 2012-07-30 DIAGNOSIS — I4891 Unspecified atrial fibrillation: Secondary | ICD-10-CM | POA: Diagnosis not present

## 2012-08-05 DIAGNOSIS — D509 Iron deficiency anemia, unspecified: Secondary | ICD-10-CM | POA: Diagnosis not present

## 2012-08-18 DIAGNOSIS — Z7901 Long term (current) use of anticoagulants: Secondary | ICD-10-CM | POA: Diagnosis not present

## 2012-08-18 DIAGNOSIS — I4891 Unspecified atrial fibrillation: Secondary | ICD-10-CM | POA: Diagnosis not present

## 2012-08-20 DIAGNOSIS — H25049 Posterior subcapsular polar age-related cataract, unspecified eye: Secondary | ICD-10-CM | POA: Diagnosis not present

## 2012-08-27 DIAGNOSIS — Z7901 Long term (current) use of anticoagulants: Secondary | ICD-10-CM | POA: Diagnosis not present

## 2012-08-27 DIAGNOSIS — I4891 Unspecified atrial fibrillation: Secondary | ICD-10-CM | POA: Diagnosis not present

## 2012-09-01 DIAGNOSIS — M545 Low back pain, unspecified: Secondary | ICD-10-CM | POA: Diagnosis not present

## 2012-09-01 DIAGNOSIS — M5106 Intervertebral disc disorders with myelopathy, lumbar region: Secondary | ICD-10-CM | POA: Diagnosis not present

## 2012-09-01 DIAGNOSIS — M47817 Spondylosis without myelopathy or radiculopathy, lumbosacral region: Secondary | ICD-10-CM | POA: Diagnosis not present

## 2012-09-01 DIAGNOSIS — R0609 Other forms of dyspnea: Secondary | ICD-10-CM | POA: Diagnosis not present

## 2012-09-02 DIAGNOSIS — H409 Unspecified glaucoma: Secondary | ICD-10-CM | POA: Diagnosis not present

## 2012-09-02 DIAGNOSIS — H4011X0 Primary open-angle glaucoma, stage unspecified: Secondary | ICD-10-CM | POA: Diagnosis not present

## 2012-09-11 DIAGNOSIS — I4891 Unspecified atrial fibrillation: Secondary | ICD-10-CM | POA: Diagnosis not present

## 2012-09-11 DIAGNOSIS — Z7901 Long term (current) use of anticoagulants: Secondary | ICD-10-CM | POA: Diagnosis not present

## 2012-10-01 DIAGNOSIS — H4011X0 Primary open-angle glaucoma, stage unspecified: Secondary | ICD-10-CM | POA: Diagnosis not present

## 2012-10-01 DIAGNOSIS — H409 Unspecified glaucoma: Secondary | ICD-10-CM | POA: Diagnosis not present

## 2012-10-12 ENCOUNTER — Ambulatory Visit (INDEPENDENT_AMBULATORY_CARE_PROVIDER_SITE_OTHER): Payer: Medicare Other | Admitting: Pharmacist

## 2012-10-12 DIAGNOSIS — I4891 Unspecified atrial fibrillation: Secondary | ICD-10-CM | POA: Diagnosis not present

## 2012-10-21 DIAGNOSIS — Z23 Encounter for immunization: Secondary | ICD-10-CM | POA: Diagnosis not present

## 2012-10-23 DIAGNOSIS — L820 Inflamed seborrheic keratosis: Secondary | ICD-10-CM | POA: Diagnosis not present

## 2012-10-23 DIAGNOSIS — L723 Sebaceous cyst: Secondary | ICD-10-CM | POA: Diagnosis not present

## 2012-10-23 DIAGNOSIS — D235 Other benign neoplasm of skin of trunk: Secondary | ICD-10-CM | POA: Diagnosis not present

## 2012-10-26 ENCOUNTER — Other Ambulatory Visit: Payer: Self-pay | Admitting: *Deleted

## 2012-10-26 ENCOUNTER — Encounter: Payer: Self-pay | Admitting: Internal Medicine

## 2012-10-26 DIAGNOSIS — I495 Sick sinus syndrome: Secondary | ICD-10-CM | POA: Diagnosis not present

## 2012-10-26 MED ORDER — WARFARIN SODIUM 3 MG PO TABS: 3.0000 mg | ORAL_TABLET | ORAL | Status: DC

## 2012-10-27 DIAGNOSIS — R0609 Other forms of dyspnea: Secondary | ICD-10-CM | POA: Diagnosis not present

## 2012-10-27 DIAGNOSIS — M47817 Spondylosis without myelopathy or radiculopathy, lumbosacral region: Secondary | ICD-10-CM | POA: Diagnosis not present

## 2012-10-27 DIAGNOSIS — M5106 Intervertebral disc disorders with myelopathy, lumbar region: Secondary | ICD-10-CM | POA: Diagnosis not present

## 2012-10-27 DIAGNOSIS — M545 Low back pain, unspecified: Secondary | ICD-10-CM | POA: Diagnosis not present

## 2012-11-05 DIAGNOSIS — M545 Low back pain, unspecified: Secondary | ICD-10-CM | POA: Diagnosis not present

## 2012-11-05 DIAGNOSIS — M47817 Spondylosis without myelopathy or radiculopathy, lumbosacral region: Secondary | ICD-10-CM | POA: Diagnosis not present

## 2012-11-09 ENCOUNTER — Ambulatory Visit (INDEPENDENT_AMBULATORY_CARE_PROVIDER_SITE_OTHER): Payer: Medicare Other | Admitting: Pharmacist

## 2012-11-09 DIAGNOSIS — I4891 Unspecified atrial fibrillation: Secondary | ICD-10-CM | POA: Diagnosis not present

## 2012-11-12 ENCOUNTER — Ambulatory Visit (INDEPENDENT_AMBULATORY_CARE_PROVIDER_SITE_OTHER): Payer: Medicare Other | Admitting: *Deleted

## 2012-11-12 ENCOUNTER — Encounter: Payer: Self-pay | Admitting: Internal Medicine

## 2012-11-12 DIAGNOSIS — I4891 Unspecified atrial fibrillation: Secondary | ICD-10-CM

## 2012-11-12 DIAGNOSIS — Z950 Presence of cardiac pacemaker: Secondary | ICD-10-CM | POA: Diagnosis not present

## 2012-11-12 LAB — MDC_IDC_ENUM_SESS_TYPE_INCLINIC
Battery Impedance: 1700 Ohm
Battery Voltage: 2.76 V
Date Time Interrogation Session: 20141106181549
Implantable Pulse Generator Model: 5826
Implantable Pulse Generator Serial Number: 1908400
Lead Channel Impedance Value: 358 Ohm
Lead Channel Pacing Threshold Pulse Width: 0.5 ms

## 2012-11-12 NOTE — Progress Notes (Signed)
Device check in clinic, all functions normal, Changed mode from DDDR to VVI per SK due to >99% mode switch + coumadin. Full details in PaceArt.  ROV w/ Dr. Johney Frame 02/19/12 @ 2:30

## 2012-11-16 ENCOUNTER — Telehealth: Payer: Self-pay | Admitting: General Surgery

## 2012-11-16 NOTE — Telephone Encounter (Signed)
Pt is taking 1/2 tablet of the 5 MG of amlodipine Daily. When she last saw [place] she was only taking it if her Bp was elevated. But lately she has been taking it every day now and her BP has been good she is still taking it with her Metoprolol 25 MG 1/2 tablet daily.

## 2012-11-16 NOTE — Telephone Encounter (Signed)
Message copied by Nita Sells on Mon Nov 16, 2012  5:13 PM ------      Message from: Armanda Magic R      Created: Sun Nov 15, 2012  4:26 PM       Please call and find out if patient is taking Amlodipine.  It states in eCW that her amlodipine was stopped by Dr. Clelia Croft ------

## 2012-11-18 MED ORDER — METOPROLOL SUCCINATE ER 25 MG PO TB24: 25.0000 mg | ORAL_TABLET | Freq: Two times a day (BID) | ORAL | Status: DC

## 2012-11-18 NOTE — Telephone Encounter (Signed)
Please have her stop amlodipine and increase metoprolol to 25mg  1 tablet BID.  Check BP and HR daily for a week and call with results

## 2012-11-18 NOTE — Telephone Encounter (Signed)
Pt is aware to increase metoprolol to 25mg  1 tablet BID And stop amlodipine

## 2012-11-18 NOTE — Addendum Note (Signed)
Addended by: Nita Sells on: 11/18/2012 01:32 PM   Modules accepted: Orders, Medications

## 2012-11-18 NOTE — Telephone Encounter (Signed)
Her HR was elevated when Dr. Graciela Husbands saw her and recommended that med change

## 2012-11-24 DIAGNOSIS — M545 Low back pain, unspecified: Secondary | ICD-10-CM | POA: Diagnosis not present

## 2012-11-24 DIAGNOSIS — M47817 Spondylosis without myelopathy or radiculopathy, lumbosacral region: Secondary | ICD-10-CM | POA: Diagnosis not present

## 2012-11-25 ENCOUNTER — Telehealth: Payer: Self-pay | Admitting: Cardiology

## 2012-11-25 NOTE — Telephone Encounter (Signed)
New message     Saw Dr Eugenia Pancoast for an injection, her bp was too high and she did not get the injection.  BP now is 174-122 and heart rate is 90.  Somebody took her off her norvasc.  Should she start the norvasc again?

## 2012-11-25 NOTE — Telephone Encounter (Signed)
When I spoke to Anna Farrell. We saw that the last phone call Dr Mayford Knife wanted her to stop her norvasc and start taking her Metoprolol 25 MG BID. Pt stated she thought she was only supposed to take it Once a day and will start it twice a day and call us in 4-5 day with new BP readings. Hopefully the BP readings will have gone down some. Pt verbalized understanding and will start with the metoprolol BID. To Dr. Mayford Knife as a Lorain Childes

## 2012-12-08 ENCOUNTER — Ambulatory Visit (INDEPENDENT_AMBULATORY_CARE_PROVIDER_SITE_OTHER): Payer: Medicare Other | Admitting: Pharmacist

## 2012-12-08 DIAGNOSIS — I4891 Unspecified atrial fibrillation: Secondary | ICD-10-CM | POA: Diagnosis not present

## 2012-12-08 LAB — POCT INR: INR: 3.8

## 2012-12-16 ENCOUNTER — Encounter: Payer: Self-pay | Admitting: General Surgery

## 2012-12-16 DIAGNOSIS — I951 Orthostatic hypotension: Secondary | ICD-10-CM | POA: Insufficient documentation

## 2012-12-17 ENCOUNTER — Encounter: Payer: Self-pay | Admitting: Cardiology

## 2012-12-17 ENCOUNTER — Ambulatory Visit (INDEPENDENT_AMBULATORY_CARE_PROVIDER_SITE_OTHER): Payer: Medicare Other | Admitting: Cardiology

## 2012-12-17 ENCOUNTER — Ambulatory Visit (INDEPENDENT_AMBULATORY_CARE_PROVIDER_SITE_OTHER): Payer: Medicare Other | Admitting: *Deleted

## 2012-12-17 VITALS — BP 134/96 | HR 97 | Ht 62.0 in | Wt 137.0 lb

## 2012-12-17 DIAGNOSIS — I4891 Unspecified atrial fibrillation: Secondary | ICD-10-CM

## 2012-12-17 DIAGNOSIS — I4949 Other premature depolarization: Secondary | ICD-10-CM

## 2012-12-17 DIAGNOSIS — I10 Essential (primary) hypertension: Secondary | ICD-10-CM

## 2012-12-17 DIAGNOSIS — I482 Chronic atrial fibrillation, unspecified: Secondary | ICD-10-CM

## 2012-12-17 DIAGNOSIS — I493 Ventricular premature depolarization: Secondary | ICD-10-CM | POA: Insufficient documentation

## 2012-12-17 DIAGNOSIS — I495 Sick sinus syndrome: Secondary | ICD-10-CM

## 2012-12-17 DIAGNOSIS — I951 Orthostatic hypotension: Secondary | ICD-10-CM | POA: Diagnosis not present

## 2012-12-17 LAB — POCT INR: INR: 1.9

## 2012-12-17 MED ORDER — AMLODIPINE BESYLATE 5 MG PO TABS: 5.0000 mg | ORAL_TABLET | Freq: Every day | ORAL | Status: DC

## 2012-12-17 NOTE — Patient Instructions (Signed)
Your physician recommends that you continue on your current medications as directed. Please refer to the Current Medication list given to you today.  Your physician wants you to follow-up in: 6 Months with Dr Turner You will receive a reminder letter in the mail two months in advance. If you don't receive a letter, please call our office to schedule the follow-up appointment.  

## 2012-12-17 NOTE — Progress Notes (Signed)
[address] [place], [place]  [address] Phone: [phone] Fax:  [phone]  Date:  12/17/2012   ID:  Anna Farrell, DOB October 25, 1927, MRN [id number]  PCP:  Anna Raider, MD  Cardiologist:  Anna Magic, MD     History of Present Illness: Anna Farrell is a 79 y.o. female with a history of chronic atrial fibrillation, orthostatic hypotension, PPM and asymptomatic PVC's who presents today for followup.  She is doing well.  She denies any chest pain, SOB, DOE, LE edema,  palpitations.  She says that her BP at home has been running 131-186/83-125mmHg.  She restarted her Norvasc and her BP over the past few days is now 122/48mmHg.  She says that when she wakes up in the am she has an empty feeling in her chest and makes her feel like she has to take a deep breath in.  This occurs before she gets out of bed and once she is up she feels fine.  She has only had 1-2 dizzy spells since I saw her last but no syncope.    Wt Readings from Last 3 Encounters:  12/17/12 137 lb (62.143 kg)  10/18/11 136 lb (61.689 kg)  09/17/11 138 lb (62.596 kg)     Past Medical History  Diagnosis Date  . Vasovagal syncope   . Hypertension   . Hyperlipidemia   . Orthostatic hypotension   . Osteoporosis   . Pacemaker   . Anemia   . Asthma     LOV Dr Anna Farrell 7/12  . Shortness of breath   . Arthritis   . Colitis 5/13  . Chronic low back pain 2008    crushed vertebrae  . Tachycardia-bradycardia syndrome     s/p PPM  . Chronic atrial fibrillation   . Asymptomatic PVCs     Current Outpatient Prescriptions  Medication Sig Dispense Refill  . acetaminophen (TYLENOL) 500 MG tablet Take 500 mg by mouth every 6 (six) hours as needed. For pain      . ALPRAZolam (XANAX) 0.25 MG tablet Take 0.25 mg by mouth at bedtime as needed. For restless legs      . amLODipine (NORVASC) 5 MG tablet Take 5 mg by mouth daily.       . cetirizine (ZYRTEC) 10 MG tablet Take 10 mg by mouth daily as needed. For allergy  symptoms      . Cholecalciferol (VITAMIN D3) 1000 UNITS CAPS Take 2 capsules by mouth daily.       . cyanocobalamin (CVS VITAMIN B-12) 500 MCG tablet Take 500 mcg by mouth once a week. Takes on Wednesday      . lidocaine (LIDODERM) 5 % Place 1 patch onto the skin daily.       . metoprolol succinate (TOPROL-XL) 25 MG 24 hr tablet Take 1 tablet (25 mg total) by mouth 2 (two) times daily.  180 tablet  3  . pravastatin (PRAVACHOL) 40 MG tablet Take 40 mg by mouth daily. Once daily      . traMADol (ULTRAM) 50 MG tablet Take 50 mg by mouth every 8 (eight) hours as needed. As needed for pain      . traMADol (ULTRAM-ER) 200 MG 24 hr tablet Take 200 mg by mouth daily. Takes 200mg  in morning then 50mg  through day PRN      . warfarin (COUMADIN) 3 MG tablet Take 1 tablet (3 mg total) by mouth as directed.  100 tablet  1   No current facility-administered medications  for this visit.    Allergies:    Allergies  Allergen Reactions  . Codeine Nausea And Vomiting    Passed out    Social History:  The patient  reports that she has never smoked. She has never used smokeless tobacco. She reports that she does not drink alcohol or use illicit drugs.   Family History:  The patient's family history is not on file.   ROS:  Please see the history of present illness.      All other systems reviewed and negative.   PHYSICAL EXAM: VS:  BP 134/96  Pulse 97  Ht 5\' 2"  (1.575 m)  Wt 137 lb (62.143 kg)  BMI 25.05 kg/m2 Well nourished, well developed, in no acute distress HEENT: normal Neck: no JVD Cardiac:  normal S1, S2; RRR; no murmur Lungs:  clear to auscultation bilaterally, no wheezing, rhonchi or rales Abd: soft, nontender, no hepatomegaly Ext: no edema Skin: warm and dry Neuro:  CNs 2-12 intact, no focal abnormalities noted       ASSESSMENT AND PLAN:  1. Chronic atrial fibrillation rate controled   - continue metoprolol/warfarin 2. Orthostatic hypotension -well controlled with very few dizzy  spells 3. HTN - mildly elevated on exam today but much improved after restarting amlodipine  - continue amlodipine/metoprolol   - She will let me know if her BP starts to trend back upward  Followup with me in 6 months  Signed, Anna Magic, MD 12/17/2012 3:16 PM

## 2012-12-28 DIAGNOSIS — H4011X0 Primary open-angle glaucoma, stage unspecified: Secondary | ICD-10-CM | POA: Diagnosis not present

## 2013-01-04 ENCOUNTER — Ambulatory Visit (INDEPENDENT_AMBULATORY_CARE_PROVIDER_SITE_OTHER): Payer: Medicare Other | Admitting: Pharmacist

## 2013-01-04 DIAGNOSIS — I4891 Unspecified atrial fibrillation: Secondary | ICD-10-CM

## 2013-01-04 DIAGNOSIS — I482 Chronic atrial fibrillation, unspecified: Secondary | ICD-10-CM

## 2013-01-04 LAB — POCT INR: INR: 3

## 2013-01-05 DIAGNOSIS — M47817 Spondylosis without myelopathy or radiculopathy, lumbosacral region: Secondary | ICD-10-CM | POA: Diagnosis not present

## 2013-01-05 DIAGNOSIS — M545 Low back pain, unspecified: Secondary | ICD-10-CM | POA: Diagnosis not present

## 2013-01-18 ENCOUNTER — Ambulatory Visit (INDEPENDENT_AMBULATORY_CARE_PROVIDER_SITE_OTHER): Payer: Medicare Other | Admitting: *Deleted

## 2013-01-18 DIAGNOSIS — I4891 Unspecified atrial fibrillation: Secondary | ICD-10-CM

## 2013-01-18 DIAGNOSIS — I482 Chronic atrial fibrillation, unspecified: Secondary | ICD-10-CM

## 2013-01-18 LAB — POCT INR: INR: 2.7

## 2013-02-01 DIAGNOSIS — M545 Low back pain, unspecified: Secondary | ICD-10-CM | POA: Diagnosis not present

## 2013-02-01 DIAGNOSIS — G894 Chronic pain syndrome: Secondary | ICD-10-CM | POA: Diagnosis not present

## 2013-02-18 ENCOUNTER — Encounter: Payer: Medicare Other | Admitting: Internal Medicine

## 2013-02-19 ENCOUNTER — Encounter: Payer: Self-pay | Admitting: Internal Medicine

## 2013-02-25 ENCOUNTER — Ambulatory Visit (INDEPENDENT_AMBULATORY_CARE_PROVIDER_SITE_OTHER): Payer: Medicare Other | Admitting: *Deleted

## 2013-02-25 DIAGNOSIS — Z5181 Encounter for therapeutic drug level monitoring: Secondary | ICD-10-CM

## 2013-02-25 DIAGNOSIS — I4891 Unspecified atrial fibrillation: Secondary | ICD-10-CM

## 2013-02-25 DIAGNOSIS — I482 Chronic atrial fibrillation, unspecified: Secondary | ICD-10-CM

## 2013-02-25 LAB — POCT INR: INR: 3.2

## 2013-02-26 ENCOUNTER — Ambulatory Visit (INDEPENDENT_AMBULATORY_CARE_PROVIDER_SITE_OTHER): Payer: Medicare Other | Admitting: Internal Medicine

## 2013-02-26 ENCOUNTER — Encounter: Payer: Self-pay | Admitting: Internal Medicine

## 2013-02-26 ENCOUNTER — Encounter (INDEPENDENT_AMBULATORY_CARE_PROVIDER_SITE_OTHER): Payer: Self-pay

## 2013-02-26 VITALS — BP 128/88 | HR 82 | Ht 62.0 in | Wt 140.0 lb

## 2013-02-26 DIAGNOSIS — I482 Chronic atrial fibrillation, unspecified: Secondary | ICD-10-CM

## 2013-02-26 DIAGNOSIS — I10 Essential (primary) hypertension: Secondary | ICD-10-CM

## 2013-02-26 DIAGNOSIS — I495 Sick sinus syndrome: Secondary | ICD-10-CM | POA: Diagnosis not present

## 2013-02-26 DIAGNOSIS — I4891 Unspecified atrial fibrillation: Secondary | ICD-10-CM | POA: Diagnosis not present

## 2013-02-26 LAB — MDC_IDC_ENUM_SESS_TYPE_INCLINIC
Battery Voltage: 2.76 V
Brady Statistic RV Percent Paced: 14 %
Implantable Pulse Generator Model: 5826
Lead Channel Pacing Threshold Amplitude: 1 V
Lead Channel Pacing Threshold Pulse Width: 0.5 ms
Lead Channel Setting Pacing Amplitude: 2.5 V
Lead Channel Setting Pacing Pulse Width: 0.5 ms
MDC IDC MSMT BATTERY IMPEDANCE: 1800 Ohm
MDC IDC MSMT LEADCHNL RV IMPEDANCE VALUE: 376 Ohm
MDC IDC MSMT LEADCHNL RV SENSING INTR AMPL: 2.4 mV
MDC IDC PG SERIAL: 1908400
MDC IDC SESS DTM: 20150220110528
MDC IDC SET LEADCHNL RV SENSING SENSITIVITY: 1.5 mV

## 2013-02-26 NOTE — Progress Notes (Signed)
[name], MD: Primary Cardiologist:  Dr Gwenith Daily Anna Farrell is a 80 y.o. female with a h/o bradycardia/tachycardia syndrome sp PPM (SJM) by Dr Leonia Reeves who presents today to establish care in the Electrophysiology device clinic.  She was documented to have pauses >  4 seconds prior to implant.  She has had longstanding difficulty with postural dizziness. The patient reports doing very well since having a pacemaker implanted and remains very active despite her age.   Today, she  denies symptoms of palpitations, chest pain, shortness of breath, orthopnea, PND, lower extremity edema,  recent syncope, or neurologic sequela.  The patientis tolerating medications without difficulties and is otherwise without complaint today.   Past Medical History  Diagnosis Date  . Vasovagal syncope   . Hypertension   . Hyperlipidemia   . Orthostatic hypotension   . Osteoporosis   . Pacemaker 10/11/06    implanted by Dr Leonia Reeves for pauses > 4 seconds with afib  . Iron deficiency anemia   . Shortness of breath   . Arthritis   . Colitis 5/13  . Chronic low back pain 2008    crushed vertebrae  . Tachycardia-bradycardia syndrome   . Permanent atrial fibrillation   . Asymptomatic PVCs   . History of colonic polyps   . Pure hypercholesterolemia   . Extrinsic asthma, unspecified     LOV Dr Joya Gaskins 7/12  . Long term (current) use of anticoagulants   . Cholelithiasis   . Generalized osteoarthrosis, unspecified site    Past Surgical History  Procedure Laterality Date  . Vesicovaginal fistula closure w/ tah  1981  . Pacemaker insertion  10/11/2006    SJM Zephyr XL DR implanted by Dr Leonia Reeves for afib with pause > 4 seconds  . Colonoscopy    . Appendectomy    . Abdominal hysterectomy    . Cholecystectomy  09/17/2011    Procedure: LAPAROSCOPIC CHOLECYSTECTOMY;  Surgeon: Rolm Bookbinder, MD;  Location: [facility]S;  Service: General;;  . Cardiac catheterization  2009    Normal coronary arteries     History   Social History  . Marital Status: Widowed    Spouse Name: N/A    Number of Children: N/A  . Years of Education: N/A   Occupational History  . Waitress     [employer]   Social History Main Topics  . Smoking status: Never Smoker   . Smokeless tobacco: Never Used  . Alcohol Use: No  . Drug Use: No  . Sexual Activity: Not on file   Other Topics Concern  . Not on file   Social History Narrative  . No narrative on file    Family History  Problem Relation Age of Onset  . Hypertension Mother     Allergies  Allergen Reactions  . Codeine Nausea And Vomiting    Passed out    Current Outpatient Prescriptions  Medication Sig Dispense Refill  . acetaminophen (TYLENOL) 500 MG tablet Take 500 mg by mouth every 6 (six) hours as needed. For pain      . ALPRAZolam (XANAX) 0.25 MG tablet Take 0.25 mg by mouth at bedtime as needed. For restless legs      . amLODipine (NORVASC) 5 MG tablet Take 1 tablet (5 mg total) by mouth daily.  90 tablet  3  . cetirizine (ZYRTEC) 10 MG tablet Take 10 mg by mouth daily as needed. For allergy symptoms      . Cholecalciferol (VITAMIN D3) 1000 UNITS CAPS Take 2  capsules by mouth daily.       . cyanocobalamin (CVS VITAMIN B-12) 500 MCG tablet Take 500 mcg by mouth once a week. Takes on Wednesday      . lidocaine (LIDODERM) 5 % Place 1 patch onto the skin daily.       . metoprolol succinate (TOPROL-XL) 25 MG 24 hr tablet Take 1 tablet (25 mg total) by mouth 2 (two) times daily.  180 tablet  3  . pravastatin (PRAVACHOL) 40 MG tablet Take 40 mg by mouth daily. Once daily      . traMADol (ULTRAM) 50 MG tablet Take 50 mg by mouth every 8 (eight) hours as needed. As needed for pain      . traMADol (ULTRAM-ER) 200 MG 24 hr tablet Take 200 mg by mouth daily. Takes 200mg  in morning then 50mg  through day PRN      . TRAVATAN Z 0.004 % SOLN ophthalmic solution Place 1 drop into both eyes daily.      Marland Kitchen warfarin (COUMADIN) 3 MG tablet Take 1 tablet (3 mg  total) by mouth as directed.  100 tablet  1   No current facility-administered medications for this visit.    ROS- all systems are reviewed and negative except as per HPI  Physical Exam: Filed Vitals:   02/26/13 1040  BP: 128/88  Pulse: 82  Height: 5\' 2"  (1.575 m)  Weight: 140 lb (63.504 kg)    GEN- The patient is elderly appearing, alert and oriented x 3 today.   Head- normocephalic, atraumatic Eyes-  Sclera clear, conjunctiva pink Ears- hearing intact Oropharynx- clear Neck- supple, no JVP Lymph- no cervical lymphadenopathy Lungs- Clear to ausculation bilaterally, normal work of breathing Chest- pacemaker pocket is well healed Heart- irregular rate and rhythm, no murmurs, rubs or gallops, PMI not laterally displaced GI- soft, NT, ND, + BS Extremities- no clubbing, cyanosis, or edema MS- age appropriate atrophy Skin- no rash or lesion Psych- euthymic mood, full affect Neuro- strength and sensation are intact  Pacemaker interrogation- reviewed in detail today,  See PACEART report  Assessment and Plan:  1. Tachycardia/ bradycardia syndrome Normal pacemaker function R waves are low but she appears to do ok with this.  No indication for lead revision, particularly given her advanced age.  No changes today. See Pace Art report No changes today  2. Permanent afib Rate controlled chads2vasc score is at least 4.  Continue long term anticoagulation  3. HTN Stable No change required today  Return to the device clinic in 6 months Follow-up with Jerene Pitch in 1 year Follow-up with Dr Radford Pax as scheduled

## 2013-02-26 NOTE — Patient Instructions (Signed)
Your physician wants you to follow-up in: 6 months with device clinic and 12 months with Brooke Edmisten, PA.  You will receive a reminder letter in the mail two months in advance. If you don't receive a letter, please call our office to schedule the follow-up appointment.  

## 2013-03-05 ENCOUNTER — Encounter: Payer: Self-pay | Admitting: Internal Medicine

## 2013-03-17 ENCOUNTER — Other Ambulatory Visit: Payer: Self-pay | Admitting: *Deleted

## 2013-03-17 MED ORDER — WARFARIN SODIUM 3 MG PO TABS: ORAL_TABLET | ORAL | Status: DC

## 2013-03-18 ENCOUNTER — Ambulatory Visit (INDEPENDENT_AMBULATORY_CARE_PROVIDER_SITE_OTHER): Payer: Medicare Other | Admitting: Pharmacist Clinician (PhC)/ Clinical Pharmacy Specialist

## 2013-03-18 DIAGNOSIS — I4891 Unspecified atrial fibrillation: Secondary | ICD-10-CM | POA: Diagnosis not present

## 2013-03-18 DIAGNOSIS — Z5181 Encounter for therapeutic drug level monitoring: Secondary | ICD-10-CM

## 2013-03-18 DIAGNOSIS — I482 Chronic atrial fibrillation, unspecified: Secondary | ICD-10-CM

## 2013-03-18 LAB — POCT INR: INR: 4.3

## 2013-04-01 ENCOUNTER — Ambulatory Visit (INDEPENDENT_AMBULATORY_CARE_PROVIDER_SITE_OTHER): Payer: Medicare Other

## 2013-04-01 DIAGNOSIS — Z5181 Encounter for therapeutic drug level monitoring: Secondary | ICD-10-CM

## 2013-04-01 DIAGNOSIS — I4891 Unspecified atrial fibrillation: Secondary | ICD-10-CM | POA: Diagnosis not present

## 2013-04-01 DIAGNOSIS — I482 Chronic atrial fibrillation, unspecified: Secondary | ICD-10-CM

## 2013-04-01 LAB — POCT INR: INR: 1.9

## 2013-04-06 ENCOUNTER — Emergency Department (HOSPITAL_COMMUNITY): Payer: Medicare Other

## 2013-04-06 ENCOUNTER — Encounter (HOSPITAL_COMMUNITY): Payer: Self-pay | Admitting: Emergency Medicine

## 2013-04-06 ENCOUNTER — Inpatient Hospital Stay (HOSPITAL_COMMUNITY)
Admission: EM | Admit: 2013-04-06 | Discharge: 2013-04-10 | DRG: 444 | Disposition: A | Discharge status: Home / Self Care | Payer: Medicare Other | Attending: Family Medicine | Admitting: Family Medicine

## 2013-04-06 DIAGNOSIS — Z7901 Long term (current) use of anticoagulants: Secondary | ICD-10-CM

## 2013-04-06 DIAGNOSIS — R1011 Right upper quadrant pain: Secondary | ICD-10-CM | POA: Diagnosis not present

## 2013-04-06 DIAGNOSIS — E785 Hyperlipidemia, unspecified: Secondary | ICD-10-CM | POA: Diagnosis present

## 2013-04-06 DIAGNOSIS — K838 Other specified diseases of biliary tract: Secondary | ICD-10-CM | POA: Diagnosis present

## 2013-04-06 DIAGNOSIS — I495 Sick sinus syndrome: Secondary | ICD-10-CM | POA: Diagnosis present

## 2013-04-06 DIAGNOSIS — R945 Abnormal results of liver function studies: Secondary | ICD-10-CM

## 2013-04-06 DIAGNOSIS — K8050 Calculus of bile duct without cholangitis or cholecystitis without obstruction: Principal | ICD-10-CM | POA: Diagnosis present

## 2013-04-06 DIAGNOSIS — R7309 Other abnormal glucose: Secondary | ICD-10-CM | POA: Diagnosis present

## 2013-04-06 DIAGNOSIS — K859 Acute pancreatitis without necrosis or infection, unspecified: Secondary | ICD-10-CM | POA: Diagnosis not present

## 2013-04-06 DIAGNOSIS — K5289 Other specified noninfective gastroenteritis and colitis: Secondary | ICD-10-CM | POA: Diagnosis present

## 2013-04-06 DIAGNOSIS — E7800 Pure hypercholesterolemia, unspecified: Secondary | ICD-10-CM | POA: Diagnosis present

## 2013-04-06 DIAGNOSIS — R109 Unspecified abdominal pain: Secondary | ICD-10-CM | POA: Diagnosis present

## 2013-04-06 DIAGNOSIS — K8020 Calculus of gallbladder without cholecystitis without obstruction: Secondary | ICD-10-CM | POA: Diagnosis not present

## 2013-04-06 DIAGNOSIS — Z79899 Other long term (current) drug therapy: Secondary | ICD-10-CM

## 2013-04-06 DIAGNOSIS — R7401 Elevation of levels of liver transaminase levels: Secondary | ICD-10-CM | POA: Diagnosis present

## 2013-04-06 DIAGNOSIS — M810 Age-related osteoporosis without current pathological fracture: Secondary | ICD-10-CM | POA: Diagnosis present

## 2013-04-06 DIAGNOSIS — R7989 Other specified abnormal findings of blood chemistry: Secondary | ICD-10-CM | POA: Diagnosis not present

## 2013-04-06 DIAGNOSIS — I10 Essential (primary) hypertension: Secondary | ICD-10-CM | POA: Diagnosis present

## 2013-04-06 DIAGNOSIS — Z8249 Family history of ischemic heart disease and other diseases of the circulatory system: Secondary | ICD-10-CM | POA: Diagnosis not present

## 2013-04-06 DIAGNOSIS — I4891 Unspecified atrial fibrillation: Secondary | ICD-10-CM | POA: Diagnosis present

## 2013-04-06 DIAGNOSIS — R7402 Elevation of levels of lactic acid dehydrogenase (LDH): Secondary | ICD-10-CM | POA: Diagnosis not present

## 2013-04-06 DIAGNOSIS — Z860100 Personal history of colon polyps, unspecified: Secondary | ICD-10-CM

## 2013-04-06 DIAGNOSIS — R17 Unspecified jaundice: Secondary | ICD-10-CM

## 2013-04-06 DIAGNOSIS — R6510 Systemic inflammatory response syndrome (SIRS) of non-infectious origin without acute organ dysfunction: Secondary | ICD-10-CM | POA: Diagnosis not present

## 2013-04-06 DIAGNOSIS — R932 Abnormal findings on diagnostic imaging of liver and biliary tract: Secondary | ICD-10-CM | POA: Diagnosis not present

## 2013-04-06 DIAGNOSIS — Z950 Presence of cardiac pacemaker: Secondary | ICD-10-CM

## 2013-04-06 DIAGNOSIS — R112 Nausea with vomiting, unspecified: Secondary | ICD-10-CM | POA: Diagnosis not present

## 2013-04-06 DIAGNOSIS — K831 Obstruction of bile duct: Secondary | ICD-10-CM | POA: Diagnosis not present

## 2013-04-06 DIAGNOSIS — R10819 Abdominal tenderness, unspecified site: Secondary | ICD-10-CM | POA: Diagnosis not present

## 2013-04-06 DIAGNOSIS — I482 Chronic atrial fibrillation, unspecified: Secondary | ICD-10-CM | POA: Diagnosis present

## 2013-04-06 DIAGNOSIS — K2990 Gastroduodenitis, unspecified, without bleeding: Secondary | ICD-10-CM | POA: Diagnosis not present

## 2013-04-06 DIAGNOSIS — K8309 Other cholangitis: Secondary | ICD-10-CM | POA: Diagnosis present

## 2013-04-06 DIAGNOSIS — R1110 Vomiting, unspecified: Secondary | ICD-10-CM

## 2013-04-06 DIAGNOSIS — J45909 Unspecified asthma, uncomplicated: Secondary | ICD-10-CM | POA: Diagnosis present

## 2013-04-06 DIAGNOSIS — K2970 Gastritis, unspecified, without bleeding: Secondary | ICD-10-CM | POA: Diagnosis not present

## 2013-04-06 DIAGNOSIS — R918 Other nonspecific abnormal finding of lung field: Secondary | ICD-10-CM | POA: Diagnosis not present

## 2013-04-06 DIAGNOSIS — R1084 Generalized abdominal pain: Secondary | ICD-10-CM | POA: Diagnosis not present

## 2013-04-06 DIAGNOSIS — R740 Nonspecific elevation of levels of transaminase and lactic acid dehydrogenase [LDH]: Secondary | ICD-10-CM

## 2013-04-06 LAB — URINALYSIS, ROUTINE W REFLEX MICROSCOPIC
Bilirubin Urine: NEGATIVE
Glucose, UA: NEGATIVE mg/dL
HGB URINE DIPSTICK: NEGATIVE
Ketones, ur: NEGATIVE mg/dL
Leukocytes, UA: NEGATIVE
Nitrite: NEGATIVE
PROTEIN: NEGATIVE mg/dL
Specific Gravity, Urine: 1.014 (ref 1.005–1.030)
UROBILINOGEN UA: 2 mg/dL — AB (ref 0.0–1.0)
pH: 7 (ref 5.0–8.0)

## 2013-04-06 LAB — COMPREHENSIVE METABOLIC PANEL
ALK PHOS: 326 U/L — AB (ref 39–117)
ALT: 607 U/L — ABNORMAL HIGH (ref 0–35)
AST: 1238 U/L — ABNORMAL HIGH (ref 0–37)
Albumin: 3.5 g/dL (ref 3.5–5.2)
BUN: 13 mg/dL (ref 6–23)
CO2: 26 meq/L (ref 19–32)
Calcium: 9.6 mg/dL (ref 8.4–10.5)
Chloride: 101 mEq/L (ref 96–112)
Creatinine, Ser: 0.51 mg/dL (ref 0.50–1.10)
GFR, EST NON AFRICAN AMERICAN: 87 mL/min — AB (ref >90)
GLUCOSE: 152 mg/dL — AB (ref 70–99)
POTASSIUM: 3.7 meq/L (ref 3.7–5.3)
SODIUM: 141 meq/L (ref 137–147)
Total Bilirubin: 1.6 mg/dL — ABNORMAL HIGH (ref 0.3–1.2)
Total Protein: 7.1 g/dL (ref 6.0–8.3)

## 2013-04-06 LAB — PROTIME-INR
INR: 2.4 — AB (ref 0.00–1.49)
PROTHROMBIN TIME: 25.4 s — AB (ref 11.6–15.2)

## 2013-04-06 LAB — CBC WITH DIFFERENTIAL/PLATELET
BASOS ABS: 0 10*3/uL (ref 0.0–0.1)
BASOS PCT: 0 % (ref 0–1)
EOS ABS: 0 10*3/uL (ref 0.0–0.7)
Eosinophils Relative: 0 % (ref 0–5)
HEMATOCRIT: 44.4 % (ref 36.0–46.0)
Hemoglobin: 14.9 g/dL (ref 12.0–15.0)
Lymphocytes Relative: 2 % — ABNORMAL LOW (ref 12–46)
Lymphs Abs: 0.3 10*3/uL — ABNORMAL LOW (ref 0.7–4.0)
MCH: 31.5 pg (ref 26.0–34.0)
MCHC: 33.6 g/dL (ref 30.0–36.0)
MCV: 93.9 fL (ref 78.0–100.0)
MONO ABS: 0.6 10*3/uL (ref 0.1–1.0)
Monocytes Relative: 6 % (ref 3–12)
Neutro Abs: 9.8 10*3/uL — ABNORMAL HIGH (ref 1.7–7.7)
Neutrophils Relative %: 92 % — ABNORMAL HIGH (ref 43–77)
PLATELETS: 231 10*3/uL (ref 150–400)
RBC: 4.73 MIL/uL (ref 3.87–5.11)
RDW: 13.1 % (ref 11.5–15.5)
WBC: 10.7 10*3/uL — ABNORMAL HIGH (ref 4.0–10.5)

## 2013-04-06 LAB — HEPATITIS PANEL, ACUTE
HCV AB: NEGATIVE
Hep A IgM: NONREACTIVE
Hep B C IgM: NONREACTIVE
Hepatitis B Surface Ag: NEGATIVE

## 2013-04-06 LAB — LIPASE, BLOOD: Lipase: 202 U/L — ABNORMAL HIGH (ref 11–59)

## 2013-04-06 LAB — ACETAMINOPHEN LEVEL: Acetaminophen (Tylenol), Serum: <15 ug/mL (ref 10–30)

## 2013-04-06 LAB — I-STAT CG4 LACTIC ACID, ED: Lactic Acid, Venous: 2.79 mmol/L — ABNORMAL HIGH (ref 0.5–2.2)

## 2013-04-06 LAB — MRSA PCR SCREENING: MRSA by PCR: NEGATIVE

## 2013-04-06 MED ORDER — METRONIDAZOLE IN NACL 5-0.79 MG/ML-% IV SOLN
500.0000 mg | Freq: Three times a day (TID) | INTRAVENOUS | Status: DC
  Administered 2013-04-06 – 2013-04-07 (×3): 500 mg via INTRAVENOUS
  Filled 2013-04-06 (×4): qty 100

## 2013-04-06 MED ORDER — SODIUM CHLORIDE 0.9 % IV SOLN: INTRAVENOUS | Status: DC

## 2013-04-06 MED ORDER — SODIUM CHLORIDE 0.9 % IV BOLUS (SEPSIS)
1000.0000 mL | Freq: Once | INTRAVENOUS | Status: AC
  Administered 2013-04-06: 1000 mL via INTRAVENOUS

## 2013-04-06 MED ORDER — HYDROMORPHONE HCL PF 1 MG/ML IJ SOLN
1.0000 mg | INTRAMUSCULAR | Status: DC | PRN
  Administered 2013-04-06 (×3): 1 mg via INTRAVENOUS
  Filled 2013-04-06 (×4): qty 1

## 2013-04-06 MED ORDER — ONDANSETRON HCL 4 MG PO TABS: 4.0000 mg | ORAL_TABLET | Freq: Four times a day (QID) | ORAL | Status: DC | PRN

## 2013-04-06 MED ORDER — PIPERACILLIN-TAZOBACTAM 3.375 G IVPB
3.3750 g | Freq: Three times a day (TID) | INTRAVENOUS | Status: DC
  Administered 2013-04-06 – 2013-04-09 (×9): 3.375 g via INTRAVENOUS
  Filled 2013-04-06 (×11): qty 50

## 2013-04-06 MED ORDER — ONDANSETRON HCL 4 MG/2ML IJ SOLN
4.0000 mg | Freq: Once | INTRAMUSCULAR | Status: AC
  Administered 2013-04-06: 4 mg via INTRAVENOUS
  Filled 2013-04-06: qty 2

## 2013-04-06 MED ORDER — ONDANSETRON HCL 4 MG/2ML IJ SOLN
4.0000 mg | Freq: Four times a day (QID) | INTRAMUSCULAR | Status: DC | PRN
  Administered 2013-04-06 – 2013-04-09 (×3): 4 mg via INTRAVENOUS
  Filled 2013-04-06 (×3): qty 2

## 2013-04-06 MED ORDER — LATANOPROST 0.005 % OP SOLN
1.0000 [drp] | Freq: Every day | OPHTHALMIC | Status: DC
  Administered 2013-04-06 – 2013-04-09 (×3): 1 [drp] via OPHTHALMIC
  Filled 2013-04-06 (×2): qty 2.5

## 2013-04-06 MED ORDER — ACETAMINOPHEN 650 MG RE SUPP
650.0000 mg | Freq: Once | RECTAL | Status: AC
  Administered 2013-04-06: 650 mg via RECTAL
  Filled 2013-04-06: qty 1

## 2013-04-06 MED ORDER — HYDROMORPHONE HCL PF 1 MG/ML IJ SOLN: 0.5000 mg | INTRAMUSCULAR | Status: DC | PRN

## 2013-04-06 MED ORDER — ONDANSETRON HCL 4 MG/2ML IJ SOLN: 4.0000 mg | Freq: Three times a day (TID) | INTRAMUSCULAR | Status: DC | PRN

## 2013-04-06 MED ORDER — IOHEXOL 300 MG/ML  SOLN
50.0000 mL | Freq: Once | INTRAMUSCULAR | Status: AC | PRN
  Administered 2013-04-06: 50 mL via ORAL

## 2013-04-06 MED ORDER — SODIUM CHLORIDE 0.9 % IV SOLN
INTRAVENOUS | Status: DC
  Administered 2013-04-06 (×2): via INTRAVENOUS

## 2013-04-06 MED ORDER — FENTANYL CITRATE 0.05 MG/ML IJ SOLN
50.0000 ug | Freq: Once | INTRAMUSCULAR | Status: AC
  Administered 2013-04-06: 50 ug via INTRAVENOUS
  Filled 2013-04-06: qty 2

## 2013-04-06 MED ORDER — IOHEXOL 300 MG/ML  SOLN
100.0000 mL | Freq: Once | INTRAMUSCULAR | Status: AC | PRN
  Administered 2013-04-06: 100 mL via INTRAVENOUS

## 2013-04-06 MED ORDER — DILTIAZEM HCL 25 MG/5ML IV SOLN
10.0000 mg | Freq: Four times a day (QID) | INTRAVENOUS | Status: DC | PRN
  Filled 2013-04-06: qty 5

## 2013-04-06 MED ORDER — SODIUM CHLORIDE 0.9 % IJ SOLN
3.0000 mL | Freq: Two times a day (BID) | INTRAMUSCULAR | Status: DC
  Administered 2013-04-07 – 2013-04-10 (×5): 3 mL via INTRAVENOUS

## 2013-04-06 MED ORDER — HYDROMORPHONE HCL PF 1 MG/ML IJ SOLN
0.5000 mg | Freq: Once | INTRAMUSCULAR | Status: AC
  Administered 2013-04-06: 0.5 mg via INTRAVENOUS
  Filled 2013-04-06: qty 1

## 2013-04-06 MED ORDER — PIPERACILLIN-TAZOBACTAM 3.375 G IVPB 30 MIN
3.3750 g | Freq: Once | INTRAVENOUS | Status: AC
  Administered 2013-04-06: 3.375 g via INTRAVENOUS
  Filled 2013-04-06: qty 50

## 2013-04-06 NOTE — Plan of Care (Signed)
Problem: Phase I Progression Outcomes Goal: Pain controlled with appropriate interventions Outcome: Progressing Dilaudid 1mg  IV Q 3hrs as needed.

## 2013-04-06 NOTE — ED Notes (Signed)
Pt states started having upper abdominal sharp pains today, then turned into a dull/aching this evening, states has been nauseous w/ vomited x 1, denies diarrhea, did have BM today that was normal. Pt denies SOB, dizziness, numbness or tingling.

## 2013-04-06 NOTE — ED Notes (Signed)
Per EMS pt started having nausea/vomiting today, eased off in middle of day, then got worse this evening, complaining of abdominal pain that has now has radiated to upper epigastric area to upper back, afib on monitor, no vomiting for EMS.

## 2013-04-06 NOTE — ED Provider Notes (Signed)
CSN: [id number]     Arrival date & time 04/06/13  6948 History   First MD Initiated Contact with Patient 04/06/13 [phone]     Chief Complaint  Patient presents with  . Nausea  . Emesis  . Back Pain     (Consider location/radiation/quality/duration/timing/severity/associated sxs/prior Treatment) HPI Comments: 80 year old female with atrial fibrillation, high blood pressure, lipids, pacemaker history presents with severe central epigastric tenderness radiating to the back. No history of similar. Symptoms have worsened the past 24 hours. Sharp and severe. Nonbloody vomiting.  Patient has a history of cholecystectomy,  hysterectomy and appendectomy. Nothing improves the pain.  The history is provided by the patient.    Past Medical History  Diagnosis Date  . Vasovagal syncope   . Hypertension   . Hyperlipidemia   . Orthostatic hypotension   . Osteoporosis   . Pacemaker 10/11/06    implanted by Dr Leonia Reeves for pauses > 4 seconds with afib  . Iron deficiency anemia   . Shortness of breath   . Arthritis   . Colitis 5/13  . Chronic low back pain 2008    crushed vertebrae  . Tachycardia-bradycardia syndrome   . Permanent atrial fibrillation   . Asymptomatic PVCs   . History of colonic polyps   . Pure hypercholesterolemia   . Extrinsic asthma, unspecified     LOV Dr Joya Gaskins 7/12  . Long term (current) use of anticoagulants   . Cholelithiasis   . Generalized osteoarthrosis, unspecified site    Past Surgical History  Procedure Laterality Date  . Vesicovaginal fistula closure w/ tah  1981  . Pacemaker insertion  10/11/2006    SJM Zephyr XL DR implanted by Dr Leonia Reeves for afib with pause > 4 seconds  . Colonoscopy    . Appendectomy    . Abdominal hysterectomy    . Cholecystectomy  09/17/2011    Procedure: LAPAROSCOPIC CHOLECYSTECTOMY;  Surgeon: Rolm Bookbinder, MD;  Location: [facility];  Service: General;;  . Cardiac catheterization  2009    Normal coronary arteries   Family History   Problem Relation Age of Onset  . Hypertension Mother    History  Substance Use Topics  . Smoking status: Never Smoker   . Smokeless tobacco: Never Used  . Alcohol Use: No   OB History   Grav Para Term Preterm Abortions TAB SAB Ect Mult Living                 Review of Systems  Constitutional: Positive for chills, appetite change and fatigue. Negative for fever.  HENT: Negative for congestion.   Eyes: Negative for visual disturbance.  Respiratory: Negative for shortness of breath.   Cardiovascular: Negative for chest pain.  Gastrointestinal: Positive for nausea, vomiting and abdominal pain. Negative for blood in stool.  Genitourinary: Negative for dysuria and flank pain.  Musculoskeletal: Positive for back pain. Negative for neck pain and neck stiffness.  Skin: Negative for rash.  Neurological: Negative for light-headedness and headaches.      Allergies  Codeine  Home Medications   Current Outpatient Rx  Name  Route  Sig  Dispense  Refill  . acetaminophen (TYLENOL) 500 MG tablet   Oral   Take 500 mg by mouth every 6 (six) hours as needed. For pain         . amLODipine (NORVASC) 5 MG tablet   Oral   Take 1 tablet (5 mg total) by mouth daily.   90 tablet   3   . Cholecalciferol (  VITAMIN D3) 1000 UNITS CAPS   Oral   Take 2 capsules by mouth daily.          Marland Kitchen lidocaine (LIDODERM) 5 %   Transdermal   Place 1 patch onto the skin daily.          . metoprolol succinate (TOPROL-XL) 25 MG 24 hr tablet   Oral   Take 1 tablet (25 mg total) by mouth 2 (two) times daily.   180 tablet   3   . pravastatin (PRAVACHOL) 40 MG tablet   Oral   Take 40 mg by mouth daily. Once daily         . traMADol (ULTRAM) 50 MG tablet   Oral   Take 50 mg by mouth every 8 (eight) hours as needed. As needed for pain         . traMADol (ULTRAM-ER) 200 MG 24 hr tablet   Oral   Take 200 mg by mouth daily. Takes 200mg  in morning then 50mg  through day PRN         . TRAVATAN  Z 0.004 % SOLN ophthalmic solution   Both Eyes   Place 1 drop into both eyes daily.         Marland Kitchen warfarin (COUMADIN) 3 MG tablet   Oral   Take 3 mg by mouth daily. Takes one tablet every day except Thursday she takes one and one half tablet          BP 135/79  Pulse 126  Temp(Src) 99.3 F (37.4 C) (Oral)  Resp 20  Ht 5\' 2"  (1.575 m)  Wt 133 lb (60.328 kg)  BMI 24.32 kg/m2  SpO2 91% Physical Exam  Nursing note and vitals reviewed. Constitutional: She is oriented to person, place, and time. She appears well-developed and well-nourished.  HENT:  Head: Normocephalic and atraumatic.  Dry mm  Eyes: Conjunctivae are normal. Right eye exhibits no discharge. Left eye exhibits no discharge. No scleral icterus.  Neck: Normal range of motion. Neck supple. No tracheal deviation present.  Cardiovascular: Regular rhythm.  Tachycardia present.   Pulses:      Radial pulses are 2+ on the right side, and 2+ on the left side.  Pulmonary/Chest: Effort normal and breath sounds normal.  Abdominal: Soft. She exhibits no distension. There is tenderness. There is guarding (mild voluntary).  Musculoskeletal: She exhibits no edema.  Neurological: She is alert and oriented to person, place, and time.  Skin: Skin is warm. No rash noted.  Psychiatric: She has a normal mood and affect.    ED Course  Procedures (including critical care time) EMERGENCY DEPARTMENT ULTRASOUND  Study: Limited Retroperitoneal Ultrasound of the Abdominal Aorta.  INDICATIONS:Abdominal pain, Back pain and Age>55 Multiple views of the abdominal aorta were obtained in real-time from the diaphragmatic hiatus to the aortic bifurcation in transverse planes with a multi-frequency probe. PERFORMED BY: Myself IMAGES ARCHIVED?: Yes FINDINGS: Maximum aortic dimensions are normal diameter, calcifications LIMITATIONS:  Body habitus and Abdominal pain INTERPRETATION:  No abdominal aortic aneurysm  CRITICAL CARE Performed by: Mariea Clonts   Total critical care time: 35 min  Critical care time was exclusive of separately billable procedures and treating other patients.  Critical care was necessary to treat or prevent imminent or life-threatening deterioration.  Critical care was time spent personally by me on the following activities: development of treatment plan with patient and/or surrogate as well as nursing, discussions with consultants, evaluation of patient's response to treatment, examination of patient, obtaining  history from patient or surrogate, ordering and performing treatments and interventions, ordering and review of laboratory studies, ordering and review of radiographic studies, pulse oximetry and re-evaluation of patient's condition.    Labs Review Labs Reviewed  CBC WITH DIFFERENTIAL - Abnormal; Notable for the following:    WBC 10.7 (*)    Neutrophils Relative % 92 (*)    Neutro Abs 9.8 (*)    Lymphocytes Relative 2 (*)    Lymphs Abs 0.3 (*)    All other components within normal limits  COMPREHENSIVE METABOLIC PANEL - Abnormal; Notable for the following:    Glucose, Bld 152 (*)    AST 1238 (*)    ALT 607 (*)    Alkaline Phosphatase 326 (*)    Total Bilirubin 1.6 (*)    GFR calc non Af Amer 87 (*)    All other components within normal limits  URINALYSIS, ROUTINE W REFLEX MICROSCOPIC - Abnormal; Notable for the following:    APPearance CLOUDY (*)    Urobilinogen, UA 2.0 (*)    All other components within normal limits  LIPASE, BLOOD - Abnormal; Notable for the following:    Lipase 202 (*)    All other components within normal limits  I-STAT CG4 LACTIC ACID, ED - Abnormal; Notable for the following:    Lactic Acid, Venous 2.79 (*)    All other components within normal limits  CULTURE, BLOOD (ROUTINE X 2)  CULTURE, BLOOD (ROUTINE X 2)  PROTIME-INR   Imaging Review Ct Abdomen Pelvis W Contrast  04/06/2013   CLINICAL DATA:  Nausea, vomiting, history of cholecystectomy, hysterectomy,  appendectomy, vesiculovaginal fistula closure.  EXAM: CT ABDOMEN AND PELVIS WITH CONTRAST  TECHNIQUE: Multidetector CT imaging of the abdomen and pelvis was performed using the standard protocol following bolus administration of intravenous contrast.  CONTRAST:  125mL OMNIPAQUE IOHEXOL 300 MG/ML  SOLN  COMPARISON:  CT ABD/PELVIS W CM dated 06/03/2011  FINDINGS: Included view of the lung bases are clear. The heart appears mildly enlarged, cardiac pacer wires in place, pericardium is unremarkable.  Status post cholecystectomy. Mild intrahepatic biliary dilatation, Common bile duct is 15 mm. No CT findings of choledocholithiasis. The liver is otherwise unremarkable.  Spleen, pancreas, adrenal glands are nonsuspicious.  Layering debris within the stomach which is moderately distended with contrast and air. Small and large bowel are normal in course and caliber. Mild multi segmental colonic wall thickening may be accentuated by under distention. Ptotic transverse colon with mild amount of retained large bowel stool. No intraperitoneal free fluid nor free air.  Kidneys are unremarkable, delayed imaging demonstrates prompt symmetric excretion of contrast into the proximal urinary collecting system. Aortoiliac vessels are normal course and caliber with mild calcific atherosclerosis. Urinary bladder is well distended unremarkable. Status post hysterectomy.  Laparotomy scar. Patient is osteopenic and scoliotic. L1 burst fracture, status post cement augmentation.  IMPRESSION: Status post interval cholecystectomy. Mild intra and extrahepatic biliary dilatation, the degree of intrahepatic biliary dilatation is increased from prior CT. No CT findings of choledocholithiasis.  Mild colonic wall thickening which may be over estimated by under distention under can reflect mild colitis. No bowel obstruction.   Electronically Signed   By: Elon Alas   On: 04/06/2013 06:03     EKG Interpretation   Date/Time:  Tuesday April 06 2013 06:52:04 EDT Ventricular Rate:  100 PR Interval:  156 QRS Duration: 89 QT Interval:  352 QTC Calculation: [id number] R Axis:   1 Text Interpretation:  A-V dual-paced  complexes w/ some inhibition No  further analysis attempted due to paced rhythm Confirmed by Jule Whitsel  MD,  Chantrice Hagg ([id number]) on 04/06/2013 7:04:07 AM      MDM   Final diagnoses:  Abdominal pain  Pancreatitis  Common bile duct dilatation  LFT elevation  Vomiting  Increased bilirubin level  Ascending cholangitis    Patient with acute abdominal pain presentation.  With age and presentation bedside ultrasound done to rule out AAA. Pain medicines and IV fluids with labs ordered. Labs revealed LFT and bilirubin elevation. Concern for common bile duct blockage. With sirs criteria concern for possible cholangitis. Zosyn antibiotic given along with repeat fluid bolus. Discussed case with GI on call who will see patient earlier this morning.  Stress case with Dr. Raliegh Ip for [facility] who accepts patient at step down.  Recheck, pain moderate.  Rechecked, pt pain improved, does not want pain meds. Rectal temp elevated, rectal tylenol.  Repeat fluid bolus.     . The patients results and plan were reviewed and discussed.   Any x-rays performed were personally reviewed by myself.   Differential diagnosis were considered with the presenting HPI.   EKG: reviewed  Filed Vitals:   04/06/13 0327 04/06/13 0530 04/06/13 0600 04/06/13 0710  BP: 135/79     Pulse: 88 128 126   Temp: 99.3 F (37.4 C)   101.8 F (38.8 C)  TempSrc: Oral   Rectal  Resp: 18 36 20   Height: 5\' 2"  (1.575 m)     Weight: 133 lb (60.328 kg)     SpO2: 98% 97% 91%     Admission/ observation were discussed with the admitting physician, patient and/or family and they are comfortable with the plan.    Mariea Clonts, MD 04/06/13 [phone]

## 2013-04-06 NOTE — Progress Notes (Signed)
CARE MANAGEMENT NOTE 04/06/2013  Patient:  Anna Farrell, Anna Farrell   Account Number:  [id number]  Date Initiated:  04/06/2013  Documentation initiated by:  DAVIS,RHONDA  Subjective/Objective Assessment:   admiotted from the ed with sepsis, poss colitis/elevated lactuic acid, elevated wbc,elevated lipase.     Action/Plan:   Private resident and lives alone/is normal able to manage all adl independantly.   Anticipated DC Date:  04/09/2013   Anticipated DC Plan:  HOME/SELF CARE  In-house referral  NA      DC Planning Services  NA      [facility] Hospital Choice  NA   Choice offered to / List presented to:  NA   DME arranged  NA      DME agency  NA     [place] arranged  NA      [place] agency  NA   Status of service:  In process, will continue to follow Medicare Important Message given?  NA - LOS <3 / Initial given by admissions (If response is "NO", the following Medicare IM given date fields will be blank) Date Medicare IM given:   Date Additional Medicare IM given:    Discharge Disposition:    Per UR Regulation:  Reviewed for med. necessity/level of care/duration of stay  If discussed at [place] of Stay Meetings, dates discussed:    Comments:  [id number]/Rhonda Eldridge Dace, [place], [place] [phone] Chart Reviewed for discharge and hospital needs. Discharge needs at time of review:  None present will follow for needs. Review of patient progress due on [id number].

## 2013-04-06 NOTE — ED Notes (Signed)
Bed: WA04 Expected date:  Expected time:  Means of arrival:  Comments: EMS 80yo N/V epigastric pain

## 2013-04-06 NOTE — ED Notes (Signed)
Pt vomiting in room. 

## 2013-04-06 NOTE — ED Notes (Signed)
POCT CG4 Lactic Acid given to Dr. Reather Converse.

## 2013-04-06 NOTE — Consult Note (Signed)
Referring Provider: Dr. Clementeen Graham Primary Care Physician:  [name], MD Primary Gastroenterologist:  Dr. Amedeo Plenty  Reason for Consultation:  Pancreatitis  HPI: Anna Farrell is a 80 y.o. female who is s/p lap chole (no IOC) in September 2013 and reports doing ok until last night when she had the acute onset of epigastric pain that radiated to her back and was very sharp and constant. Had profuse N/V with it that persisted throughout the night and also during presentation to the hospital. Reports history of IBS and states last night's pain was much more intense than her IBS flares. She ate a sandwich around 530pm and the pain started around 9 pm. CT shows NO definite CBD stone. No pancreatitis on CT. Mild intrahepatic and extrahepatic biliary dilation and CBD dilation increased compared with 05/2011 (pre-GB surgery). Denies diarrhea during her IBS flares stating that she will have abdominal pain with soft stools during those episodes. She is on chronic Coumadin for Afib and last took a dose (3mg ) yesterday. INR 2.40.  Results for Anna, Farrell (MRN [id number]) as of 04/06/2013 10:55  Ref. Range 04/06/2013 04:00  Alkaline Phosphatase Latest Range: 39-117 U/L 326 (H)  Albumin Latest Range: 3.5-5.2 g/dL 3.5  Lipase Latest Range: 11-59 U/L 202 (H)  AST Latest Range: 0-37 U/L 1238 (H)  ALT Latest Range: 0-35 U/L 607 (H)  Total Protein Latest Range: 6.0-8.3 g/dL 7.1  Total Bilirubin Latest Range: 0.3-1.2 mg/dL 1.6 (H)    Feels a lot better now with pain meds. On Zosyn and Flagyl. Hemodynamically stable. Son-in-law at bedside.  Past Medical History  Diagnosis Date  . Vasovagal syncope   . Hypertension   . Hyperlipidemia   . Orthostatic hypotension   . Osteoporosis   . Pacemaker 10/11/06    implanted by Dr Leonia Reeves for pauses > 4 seconds with afib  . Iron deficiency anemia   . Shortness of breath   . Arthritis   . Colitis 5/13  . Chronic low back pain 2008    crushed vertebrae  .  Tachycardia-bradycardia syndrome   . Permanent atrial fibrillation   . Asymptomatic PVCs   . History of colonic polyps   . Pure hypercholesterolemia   . Extrinsic asthma, unspecified     LOV Dr Joya Gaskins 7/12  . Long term (current) use of anticoagulants   . Cholelithiasis   . Generalized osteoarthrosis, unspecified site     Past Surgical History  Procedure Laterality Date  . Vesicovaginal fistula closure w/ tah  1981  . Pacemaker insertion  10/11/2006    SJM Zephyr XL DR implanted by Dr Leonia Reeves for afib with pause > 4 seconds  . Colonoscopy    . Appendectomy    . Abdominal hysterectomy    . Cholecystectomy  09/17/2011    Procedure: LAPAROSCOPIC CHOLECYSTECTOMY;  Surgeon: Rolm Bookbinder, MD;  Location: [facility];  Service: General;;  . Cardiac catheterization  2009    Normal coronary arteries    Prior to Admission medications   Medication Sig Start Date End Date Taking? Authorizing Provider  acetaminophen (TYLENOL) 500 MG tablet Take 500 mg by mouth every 6 (six) hours as needed. For pain   Yes Historical Provider, MD  amLODipine (NORVASC) 5 MG tablet Take 1 tablet (5 mg total) by mouth daily. 12/17/12  Yes Sueanne Margarita, MD  Cholecalciferol (VITAMIN D3) 1000 UNITS CAPS Take 2 capsules by mouth daily.    Yes Historical Provider, MD  lidocaine (LIDODERM) 5 % Place 1 patch onto the skin  daily.  10/06/12  Yes Historical Provider, MD  metoprolol succinate (TOPROL-XL) 25 MG 24 hr tablet Take 1 tablet (25 mg total) by mouth 2 (two) times daily. 11/18/12  Yes Sueanne Margarita, MD  pravastatin (PRAVACHOL) 40 MG tablet Take 40 mg by mouth daily. Once daily   Yes Historical Provider, MD  traMADol (ULTRAM) 50 MG tablet Take 50 mg by mouth every 8 (eight) hours as needed. As needed for pain   Yes Historical Provider, MD  traMADol (ULTRAM-ER) 200 MG 24 hr tablet Take 200 mg by mouth daily. Takes 200mg  in morning then 50mg  through day PRN   Yes Historical Provider, MD  TRAVATAN Z 0.004 % SOLN  ophthalmic solution Place 1 drop into both eyes daily. 01/04/13  Yes Historical Provider, MD  warfarin (COUMADIN) 3 MG tablet Take 3 mg by mouth daily. Takes one tablet every day except Thursday she takes one and one half tablet   Yes Historical Provider, MD    Scheduled Meds: . latanoprost  1 drop Both Eyes QHS  . metronidazole  500 mg Intravenous Q8H  . piperacillin-tazobactam (ZOSYN)  IV  3.375 g Intravenous 3 times per day  . sodium chloride  3 mL Intravenous Q12H   Continuous Infusions: . sodium chloride 100 mL/hr at 04/06/13 0913   PRN Meds:.diltiazem, HYDROmorphone (DILAUDID) injection, ondansetron (ZOFRAN) IV, ondansetron  Allergies as of 04/06/2013 - Review Complete 04/06/2013  Allergen Reaction Noted  . Codeine Nausea And Vomiting     Family History  Problem Relation Age of Onset  . Hypertension Mother     History   Social History  . Marital Status: Widowed    Spouse Name: N/A    Number of Children: N/A  . Years of Education: N/A   Occupational History  . Waitress     [employer]   Social History Main Topics  . Smoking status: Never Smoker   . Smokeless tobacco: Never Used  . Alcohol Use: No  . Drug Use: No  . Sexual Activity: No   Other Topics Concern  . Not on file   Social History Narrative  . No narrative on file    Review of Systems: All negative except as stated above in HPI.  Physical Exam: Vital signs: Filed Vitals:   04/06/13 1000  BP: 110/85  Pulse: 106  Temp: 98.8  Resp: 23     General:   Alert,  Elderly, Well-developed, well-nourished, pleasant and cooperative in NAD HEENT: no oral lesions, anicteric Neck: supple, nontender Lungs:  Clear throughout to auscultation.   No wheezes, crackles, or rhonchi. No acute distress. Heart:  Regular rate and rhythm Abdomen: minimal epigastric tenderness with guarding, soft, nondistended, +BS  Rectal:  Deferred Ext: no edema Neuro: oriented  GI:  Lab Results:  Recent Labs  04/06/13 0400   WBC 10.7*  HGB 14.9  HCT 44.4  PLT 231   BMET  Recent Labs  04/06/13 0400  NA 141  K 3.7  CL 101  CO2 26  GLUCOSE 152*  BUN 13  CREATININE 0.51  CALCIUM 9.6   LFT  Recent Labs  04/06/13 0400  PROT 7.1  ALBUMIN 3.5  AST 1238*  ALT 607*  ALKPHOS 326*  BILITOT 1.6*   PT/INR  Recent Labs  04/06/13 0410  LABPROT 25.4*  INR 2.40*     Studies/Results: Ct Abdomen Pelvis W Contrast  04/06/2013   CLINICAL DATA:  Nausea, vomiting, history of cholecystectomy, hysterectomy, appendectomy, vesiculovaginal fistula closure.  EXAM: CT ABDOMEN  AND PELVIS WITH CONTRAST  TECHNIQUE: Multidetector CT imaging of the abdomen and pelvis was performed using the standard protocol following bolus administration of intravenous contrast.  CONTRAST:  141mL OMNIPAQUE IOHEXOL 300 MG/ML  SOLN  COMPARISON:  CT ABD/PELVIS W CM dated 06/03/2011  FINDINGS: Included view of the lung bases are clear. The heart appears mildly enlarged, cardiac pacer wires in place, pericardium is unremarkable.  Status post cholecystectomy. Mild intrahepatic biliary dilatation, Common bile duct is 15 mm. No CT findings of choledocholithiasis. The liver is otherwise unremarkable.  Spleen, pancreas, adrenal glands are nonsuspicious.  Layering debris within the stomach which is moderately distended with contrast and air. Small and large bowel are normal in course and caliber. Mild multi segmental colonic wall thickening may be accentuated by under distention. Ptotic transverse colon with mild amount of retained large bowel stool. No intraperitoneal free fluid nor free air.  Kidneys are unremarkable, delayed imaging demonstrates prompt symmetric excretion of contrast into the proximal urinary collecting system. Aortoiliac vessels are normal course and caliber with mild calcific atherosclerosis. Urinary bladder is well distended unremarkable. Status post hysterectomy.  Laparotomy scar. Patient is osteopenic and scoliotic. L1 burst  fracture, status post cement augmentation.  IMPRESSION: Status post interval cholecystectomy. Mild intra and extrahepatic biliary dilatation, the degree of intrahepatic biliary dilatation is increased from prior CT. No CT findings of choledocholithiasis.  Mild colonic wall thickening which may be over estimated by under distention under can reflect mild colitis. No bowel obstruction.   Electronically Signed   By: Elon Alas   On: 04/06/2013 06:03   Dg Chest Port 1 View  04/06/2013   CLINICAL DATA:  Abdomen pain  EXAM: PORTABLE CHEST - 1 VIEW  COMPARISON:  09/12/2011  FINDINGS: Cardiac enlargement without heart failure. Dual lead pacemaker unchanged. Negative for pneumonia or effusion.  IMPRESSION: No active disease.   Electronically Signed   By: Franchot Gallo M.D.   On: 04/06/2013 07:06    Impression/Plan: 80 yo with elevated LFTs and pancreatitis likely due to a biliary source. Main concern would be for passage of a gallstone vs retained CBD stone. She is currently hemodynamically stable and I do not think she is having acute cholangitis. Will follow her LFTs in AM and INR level and she will need an ERCP if her labs do not improve. May need correction of INR if LFTs remain elevated prior to ERCP. Risks/benefits of ERCP discussed in case that procedure is needed. Cannot get an MRCP due to pacemaker. Keep NPO. IVFs. IV Abx. Follow labs in AM and ERCP tomorrow if necessary. Dr. Watt Climes to see in AM. Updated daughter by phone.    LOS: 0 days   [place] C.  04/06/2013, 10:50 AM

## 2013-04-06 NOTE — H&P (Signed)
[facility] History and Physical  Anna Farrell [id number] DOB: 06/21/1927 DOA: 04/06/2013  Referring physician: ED PCP: [name], MD   Chief Complaint:  Epigastric pain with nausea and vomiting since one day  HPI:  80 year old female with history of A. fib on Coumadin, hypertension, hyperlipidemia, pacemaker for sinus pause, history of laparoscopic cholecystectomy in September 2013 (low IOC done) who presented with acute onset of epigastric pain associated with nausea and vomiting since last evening. Patient was in her usual state of health when she had acute onset of epigastric pain which was sharp in nature 10/10 in intensity and radiating to her right scapula. Patient had a sandwich in the evening and the pain started about 4 hours later. She had several episodes of nausea and vomiting following this fall the night as well as upon arrival to the ED. She denies eating anything outside of any sick contacts. Denies any fever but did experience some chills during the night. She denies any change in her medications. Denies recent travel. Sig did have some loose bowel movements which she attributes it to her IBS symptoms. Patient denies headache, dizziness, fever, chest pain, palpitations, SOB,  bowel or urinary symptoms. Denies change in weight or appetite.  Course in the ED Patient was tachycardic to 120s, had temperature of 10 36F and mildly tachypneic. She had abdominal tenderness. Blood work done showed marked transaminitis with AST of 1238 and ALT of 607, alkaline phosphatase of 226, total bilirubin of 1.6. She had mild leukocytosis. INR was 2.4. Her lipase was 202 and lactic acid of 2.79. UA was unremarkable. Blood cultures ordered from the ED and patient given one dose of IV Zosyn. A CT scan of the abdomen and pelvis done in the ED showed mild intra-and extrahepatic biliary dilatation. Also sort of mild colonic wall thickening possibly reflecting mild colitis. [facility] called for admission to step down.   Review of Systems:  Constitutional: Denies fever, subjective chills, appetite change and fatigue.  HEENT: Denies photophobia, eye pain, redness, hearing loss, ear pain, congestion, sore throat, rhinorrhea, sneezing, mouth sores, trouble swallowing, neck pain,  Respiratory: Denies SOB, DOE, cough, chest tightness,  and wheezing.   Cardiovascular: Denies chest pain, palpitations and leg swelling.  Gastrointestinal:  nausea, vomiting, abdominal pain, denies diarrhea, constipation, blood in stool and abdominal distention.  Genitourinary: Denies dysuria, urgency, frequency, hematuria, flank pain and difficulty urinating.  Musculoskeletal: Denies myalgias, back pain, joint swelling, arthralgias and gait problem.  Skin: Denies pallor, rash and wound.  Neurological: Denies dizziness, seizures, syncope, weakness, light-headedness, numbness and headaches.  Psychiatric/Behavioral: Denies  confusion,    Past Medical History  Diagnosis Date  . Vasovagal syncope   . Hypertension   . Hyperlipidemia   . Orthostatic hypotension   . Osteoporosis   . Pacemaker 10/11/06    implanted by Dr Leonia Reeves for pauses > 4 seconds with afib  . Iron deficiency anemia   . Shortness of breath   . Arthritis   . Colitis 5/13  . Chronic low back pain 2008    crushed vertebrae  . Tachycardia-bradycardia syndrome   . Permanent atrial fibrillation   . Asymptomatic PVCs   . History of colonic polyps   . Pure hypercholesterolemia   . Extrinsic asthma, unspecified     LOV Dr Joya Gaskins 7/12  . Long term (current) use of anticoagulants   . Cholelithiasis   . Generalized osteoarthrosis, unspecified site    Past Surgical History  Procedure Laterality Date  . Vesicovaginal  fistula closure w/ tah  1981  . Pacemaker insertion  10/11/2006    SJM Zephyr XL DR implanted by Dr Leonia Reeves for afib with pause > 4 seconds  . Colonoscopy    . Appendectomy    . Abdominal hysterectomy     . Cholecystectomy  09/17/2011    Procedure: LAPAROSCOPIC CHOLECYSTECTOMY;  Surgeon: Rolm Bookbinder, MD;  Location: [facility];  Service: General;;  . Cardiac catheterization  2009    Normal coronary arteries   Social History:  reports that she has never smoked. She has never used smokeless tobacco. She reports that she does not drink alcohol or use illicit drugs.  Allergies  Allergen Reactions  . Codeine Nausea And Vomiting    Passed out    Family History  Problem Relation Age of Onset  . Hypertension Mother     Prior to Admission medications   Medication Sig Start Date End Date Taking? Authorizing Provider  acetaminophen (TYLENOL) 500 MG tablet Take 500 mg by mouth every 6 (six) hours as needed. For pain   Yes Historical Provider, MD  amLODipine (NORVASC) 5 MG tablet Take 1 tablet (5 mg total) by mouth daily. 12/17/12  Yes Sueanne Margarita, MD  Cholecalciferol (VITAMIN D3) 1000 UNITS CAPS Take 2 capsules by mouth daily.    Yes Historical Provider, MD  lidocaine (LIDODERM) 5 % Place 1 patch onto the skin daily.  10/06/12  Yes Historical Provider, MD  metoprolol succinate (TOPROL-XL) 25 MG 24 hr tablet Take 1 tablet (25 mg total) by mouth 2 (two) times daily. 11/18/12  Yes Sueanne Margarita, MD  pravastatin (PRAVACHOL) 40 MG tablet Take 40 mg by mouth daily. Once daily   Yes Historical Provider, MD  traMADol (ULTRAM) 50 MG tablet Take 50 mg by mouth every 8 (eight) hours as needed. As needed for pain   Yes Historical Provider, MD  traMADol (ULTRAM-ER) 200 MG 24 hr tablet Take 200 mg by mouth daily. Takes 200mg  in morning then 50mg  through day PRN   Yes Historical Provider, MD  TRAVATAN Z 0.004 % SOLN ophthalmic solution Place 1 drop into both eyes daily. 01/04/13  Yes Historical Provider, MD  warfarin (COUMADIN) 3 MG tablet Take 3 mg by mouth daily. Takes one tablet every day except Thursday she takes one and one half tablet   Yes Historical Provider, MD     Physical Exam:  Filed Vitals:    04/06/13 0710 04/06/13 0715 04/06/13 0730 04/06/13 0800  BP:   143/80 150/80  Pulse:  113 113 82  Temp: 101.8 F (38.8 C)   98.8 F (37.1 C)  TempSrc: Rectal     Resp:  22 23 24   Height:      Weight:      SpO2:  92% 93% 94%    Constitutional: Vital signs reviewed.  Elderly female in no acute distress. HEENT: no pallor, no icterus, dry oral mucosa, no cervical lymphadenopathy Cardiovascular: S1 and S2 irregularly irregular, no MRG Chest: CTAB, no wheezes, rales, or rhonchi Abdominal: Soft. , non-distended, bowel sounds are normal, epigastric and right upper quadrant tenderness to palpation  Ext: warm, no edema Neurological: A&O x3, non focal  Labs on Admission:  Basic Metabolic Panel:  Recent Labs Lab 04/06/13 0400  NA 141  K 3.7  CL 101  CO2 26  GLUCOSE 152*  BUN 13  CREATININE 0.51  CALCIUM 9.6   Liver Function Tests:  Recent Labs Lab 04/06/13 0400  AST 1238*  ALT 607*  ALKPHOS 326*  BILITOT 1.6*  PROT 7.1  ALBUMIN 3.5    Recent Labs Lab 04/06/13 0400  LIPASE 202*   No results found for this basename: AMMONIA,  in the last 168 hours CBC:  Recent Labs Lab 04/06/13 0400  WBC 10.7*  NEUTROABS 9.8*  HGB 14.9  HCT 44.4  MCV 93.9  PLT 231   Cardiac Enzymes: No results found for this basename: CKTOTAL, CKMB, CKMBINDEX, TROPONINI,  in the last 168 hours BNP: No components found with this basename: POCBNP,  CBG: No results found for this basename: GLUCAP,  in the last 168 hours  Radiological Exams on Admission: Ct Abdomen Pelvis W Contrast  04/06/2013   CLINICAL DATA:  Nausea, vomiting, history of cholecystectomy, hysterectomy, appendectomy, vesiculovaginal fistula closure.  EXAM: CT ABDOMEN AND PELVIS WITH CONTRAST  TECHNIQUE: Multidetector CT imaging of the abdomen and pelvis was performed using the standard protocol following bolus administration of intravenous contrast.  CONTRAST:  124mL OMNIPAQUE IOHEXOL 300 MG/ML  SOLN  COMPARISON:  CT  ABD/PELVIS W CM dated 06/03/2011  FINDINGS: Included view of the lung bases are clear. The heart appears mildly enlarged, cardiac pacer wires in place, pericardium is unremarkable.  Status post cholecystectomy. Mild intrahepatic biliary dilatation, Common bile duct is 15 mm. No CT findings of choledocholithiasis. The liver is otherwise unremarkable.  Spleen, pancreas, adrenal glands are nonsuspicious.  Layering debris within the stomach which is moderately distended with contrast and air. Small and large bowel are normal in course and caliber. Mild multi segmental colonic wall thickening may be accentuated by under distention. Ptotic transverse colon with mild amount of retained large bowel stool. No intraperitoneal free fluid nor free air.  Kidneys are unremarkable, delayed imaging demonstrates prompt symmetric excretion of contrast into the proximal urinary collecting system. Aortoiliac vessels are normal course and caliber with mild calcific atherosclerosis. Urinary bladder is well distended unremarkable. Status post hysterectomy.  Laparotomy scar. Patient is osteopenic and scoliotic. L1 burst fracture, status post cement augmentation.  IMPRESSION: Status post interval cholecystectomy. Mild intra and extrahepatic biliary dilatation, the degree of intrahepatic biliary dilatation is increased from prior CT. No CT findings of choledocholithiasis.  Mild colonic wall thickening which may be over estimated by under distention under can reflect mild colitis. No bowel obstruction.   Electronically Signed   By: Elon Alas   On: 04/06/2013 06:03   Dg Chest Port 1 View  04/06/2013   CLINICAL DATA:  Abdomen pain  EXAM: PORTABLE CHEST - 1 VIEW  COMPARISON:  09/12/2011  FINDINGS: Cardiac enlargement without heart failure. Dual lead pacemaker unchanged. Negative for pneumonia or effusion.  IMPRESSION: No active disease.   Electronically Signed   By: Franchot Gallo M.D.   On: 04/06/2013 07:06    EKG:    Assessment/Plan Principal Problem:   SIRS with Acute cholangitis Patient presented with fever, tachycardia and tachypnea he can criteria for SIRS. Patient has cholangitis with findings of common bile duct dilate patient and significant transaminitis secondary to choledocholithiasis. Patient had laparoscopic cholecystectomy in 2013. She also has mild pancreatitis as well.. -Empiric antibiotics with IV Zosyn and Flagyl. Follow blood culture. -Keep n.p.o. Hydration with IV normal saline. Supportive care with when necessary Dilaudid for pain and Zofran for nausea. -Monitor LFTs closely. -Appreciate GI consult. Plan on ERCP tomorrow if INR improves. Has pacemaker so cannot get MRCP. Will hold Coumadin for now. -Serial abdominal exam.   Active Problems:  Acute pancreatitis Secondary to choledocholithiasis. Continue n.p.o. IV hydration and  narcotics. Monitor LFTs.  Acute colitis CT scan of the abdomen suggestive of mild acute colitis. Patient on empiric Zosyn and Flagyl. Monitor clinically.    HYPERTENSION hold by mouth medication. We'll place her on IV Cardizem when necessary.    Chronic atrial fibrillation Patient in A. fib upon presentation. INR therapeutic. Hold Coumadin. We'll place on IV Cardizem when necessary for rate control  History of pacemaker Stable.      Diet: N.p.o.  DVT prophylaxis: Therapeutic on oral Coumadin   Code Status: full code Family Communication: discussed with son and daughter at bedside Disposition Plan: Home once improved  Manvir Prabhu, [facility] Pager [phone]  Total time spent on admission :70 minutes  If 7PM-7AM, please contact night-coverage [email or web address] Password [facility] 04/06/2013, 8:47 AM

## 2013-04-07 ENCOUNTER — Encounter (HOSPITAL_COMMUNITY): Payer: Self-pay | Admitting: *Deleted

## 2013-04-07 ENCOUNTER — Inpatient Hospital Stay (HOSPITAL_COMMUNITY): Payer: Medicare Other

## 2013-04-07 ENCOUNTER — Encounter (HOSPITAL_COMMUNITY): Payer: Medicare Other | Admitting: Anesthesiology

## 2013-04-07 ENCOUNTER — Inpatient Hospital Stay (HOSPITAL_COMMUNITY): Payer: Medicare Other | Admitting: Anesthesiology

## 2013-04-07 ENCOUNTER — Encounter (HOSPITAL_COMMUNITY)
Admission: EM | Disposition: A | Discharge status: Home / Self Care | Payer: Self-pay | Source: Home / Self Care | Attending: Family Medicine

## 2013-04-07 DIAGNOSIS — R6510 Systemic inflammatory response syndrome (SIRS) of non-infectious origin without acute organ dysfunction: Secondary | ICD-10-CM | POA: Diagnosis not present

## 2013-04-07 DIAGNOSIS — K859 Acute pancreatitis without necrosis or infection, unspecified: Secondary | ICD-10-CM | POA: Diagnosis not present

## 2013-04-07 DIAGNOSIS — R109 Unspecified abdominal pain: Secondary | ICD-10-CM

## 2013-04-07 DIAGNOSIS — K831 Obstruction of bile duct: Secondary | ICD-10-CM | POA: Diagnosis not present

## 2013-04-07 DIAGNOSIS — K8309 Other cholangitis: Secondary | ICD-10-CM | POA: Diagnosis not present

## 2013-04-07 DIAGNOSIS — K838 Other specified diseases of biliary tract: Secondary | ICD-10-CM

## 2013-04-07 DIAGNOSIS — K8020 Calculus of gallbladder without cholecystitis without obstruction: Secondary | ICD-10-CM | POA: Diagnosis not present

## 2013-04-07 DIAGNOSIS — K8050 Calculus of bile duct without cholangitis or cholecystitis without obstruction: Secondary | ICD-10-CM | POA: Diagnosis not present

## 2013-04-07 DIAGNOSIS — R1011 Right upper quadrant pain: Secondary | ICD-10-CM | POA: Diagnosis not present

## 2013-04-07 DIAGNOSIS — R10819 Abdominal tenderness, unspecified site: Secondary | ICD-10-CM | POA: Diagnosis not present

## 2013-04-07 DIAGNOSIS — R932 Abnormal findings on diagnostic imaging of liver and biliary tract: Secondary | ICD-10-CM | POA: Diagnosis not present

## 2013-04-07 HISTORY — PX: EUS: SHX5427

## 2013-04-07 HISTORY — PX: ERCP: SHX5425

## 2013-04-07 LAB — PREPARE PLATELET PHERESIS
Unit division: 0
Unit division: 0

## 2013-04-07 LAB — CBC WITH DIFFERENTIAL/PLATELET
Basophils Absolute: 0 10*3/uL (ref 0.0–0.1)
Basophils Relative: 0 % (ref 0–1)
EOS ABS: 0.1 10*3/uL (ref 0.0–0.7)
EOS PCT: 1 % (ref 0–5)
HEMATOCRIT: 37.2 % (ref 36.0–46.0)
Hemoglobin: 11.8 g/dL — ABNORMAL LOW (ref 12.0–15.0)
Lymphocytes Relative: 5 % — ABNORMAL LOW (ref 12–46)
Lymphs Abs: 0.6 10*3/uL — ABNORMAL LOW (ref 0.7–4.0)
MCH: 30.4 pg (ref 26.0–34.0)
MCHC: 31.7 g/dL (ref 30.0–36.0)
MCV: 95.9 fL (ref 78.0–100.0)
MONO ABS: 0.7 10*3/uL (ref 0.1–1.0)
Monocytes Relative: 6 % (ref 3–12)
Neutro Abs: 10.3 10*3/uL — ABNORMAL HIGH (ref 1.7–7.7)
Neutrophils Relative %: 88 % — ABNORMAL HIGH (ref 43–77)
Platelets: 165 10*3/uL (ref 150–400)
RBC: 3.88 MIL/uL (ref 3.87–5.11)
RDW: 14 % (ref 11.5–15.5)
WBC: 11.8 10*3/uL — ABNORMAL HIGH (ref 4.0–10.5)

## 2013-04-07 LAB — LACTIC ACID, PLASMA: Lactic Acid, Venous: 1.7 mmol/L (ref 0.5–2.2)

## 2013-04-07 LAB — COMPREHENSIVE METABOLIC PANEL
ALK PHOS: 206 U/L — AB (ref 39–117)
ALT: 378 U/L — AB (ref 0–35)
AST: 334 U/L — ABNORMAL HIGH (ref 0–37)
Albumin: 2.7 g/dL — ABNORMAL LOW (ref 3.5–5.2)
BUN: 14 mg/dL (ref 6–23)
CO2: 25 meq/L (ref 19–32)
Calcium: 8.5 mg/dL (ref 8.4–10.5)
Chloride: 107 mEq/L (ref 96–112)
Creatinine, Ser: 0.74 mg/dL (ref 0.50–1.10)
GFR, EST AFRICAN AMERICAN: 89 mL/min — AB (ref >90)
GFR, EST NON AFRICAN AMERICAN: 77 mL/min — AB (ref >90)
Glucose, Bld: 111 mg/dL — ABNORMAL HIGH (ref 70–99)
POTASSIUM: 3.3 meq/L — AB (ref 3.7–5.3)
Sodium: 143 mEq/L (ref 137–147)
Total Bilirubin: 3.5 mg/dL — ABNORMAL HIGH (ref 0.3–1.2)
Total Protein: 5.4 g/dL — ABNORMAL LOW (ref 6.0–8.3)

## 2013-04-07 LAB — LIPASE, BLOOD: Lipase: 59 U/L (ref 11–59)

## 2013-04-07 LAB — ABO/RH: ABO/RH(D): O POS

## 2013-04-07 LAB — PROTIME-INR
INR: 2.72 — ABNORMAL HIGH (ref 0.00–1.49)
Prothrombin Time: 27.9 seconds — ABNORMAL HIGH (ref 11.6–15.2)

## 2013-04-07 SURGERY — ERCP, WITH INTERVENTION IF INDICATED
Anesthesia: General

## 2013-04-07 MED ORDER — ESMOLOL HCL 10 MG/ML IV SOLN
INTRAVENOUS | Status: DC | PRN
  Administered 2013-04-07: 10 mg via INTRAVENOUS

## 2013-04-07 MED ORDER — TRAMADOL HCL 50 MG PO TABS: 100.0000 mg | ORAL_TABLET | Freq: Three times a day (TID) | ORAL | Status: DC

## 2013-04-07 MED ORDER — FENTANYL CITRATE 0.05 MG/ML IJ SOLN
INTRAMUSCULAR | Status: AC
  Filled 2013-04-07: qty 2

## 2013-04-07 MED ORDER — KETAMINE HCL 10 MG/ML IJ SOLN
INTRAMUSCULAR | Status: AC
  Filled 2013-04-07: qty 1

## 2013-04-07 MED ORDER — PHENYLEPHRINE HCL 10 MG/ML IJ SOLN
INTRAMUSCULAR | Status: DC | PRN
  Administered 2013-04-07: 40 ug via INTRAVENOUS

## 2013-04-07 MED ORDER — MORPHINE SULFATE 2 MG/ML IJ SOLN
2.0000 mg | INTRAMUSCULAR | Status: DC | PRN
  Administered 2013-04-07 – 2013-04-08 (×3): 2 mg via INTRAVENOUS
  Filled 2013-04-07 (×3): qty 1

## 2013-04-07 MED ORDER — LIDOCAINE 5 % EX PTCH
1.0000 | MEDICATED_PATCH | CUTANEOUS | Status: DC
  Administered 2013-04-07 – 2013-04-10 (×4): 1 via TRANSDERMAL
  Filled 2013-04-07 (×5): qty 1

## 2013-04-07 MED ORDER — TRAMADOL HCL 50 MG PO TABS
100.0000 mg | ORAL_TABLET | Freq: Four times a day (QID) | ORAL | Status: DC | PRN
  Administered 2013-04-07: 100 mg via ORAL
  Filled 2013-04-07: qty 2

## 2013-04-07 MED ORDER — PROPOFOL 10 MG/ML IV BOLUS
INTRAVENOUS | Status: AC
  Filled 2013-04-07: qty 20

## 2013-04-07 MED ORDER — KETAMINE HCL 10 MG/ML IJ SOLN
INTRAMUSCULAR | Status: DC | PRN
  Administered 2013-04-07 (×3): 5 mg via INTRAVENOUS

## 2013-04-07 MED ORDER — TRAMADOL HCL ER 200 MG PO TB24: 200.0000 mg | ORAL_TABLET | Freq: Every day | ORAL | Status: DC

## 2013-04-07 MED ORDER — IOHEXOL 350 MG/ML SOLN
INTRAVENOUS | Status: DC | PRN
  Administered 2013-04-07: 14:00:00

## 2013-04-07 MED ORDER — OXYCODONE HCL 5 MG PO TABS: 5.0000 mg | ORAL_TABLET | Freq: Four times a day (QID) | ORAL | Status: DC | PRN

## 2013-04-07 MED ORDER — PROPOFOL 10 MG/ML IV BOLUS
INTRAVENOUS | Status: DC | PRN
  Administered 2013-04-07: 20 mg via INTRAVENOUS
  Administered 2013-04-07: 140 mg via INTRAVENOUS
  Administered 2013-04-07: 20 mg via INTRAVENOUS

## 2013-04-07 MED ORDER — LACTATED RINGERS IV SOLN
INTRAVENOUS | Status: DC
  Administered 2013-04-07: 1000 mL via INTRAVENOUS

## 2013-04-07 MED ORDER — FENTANYL CITRATE 0.05 MG/ML IJ SOLN
INTRAMUSCULAR | Status: DC | PRN
  Administered 2013-04-07 (×3): 25 ug via INTRAVENOUS
  Administered 2013-04-07: 50 ug via INTRAVENOUS
  Administered 2013-04-07: 25 ug via INTRAVENOUS

## 2013-04-07 MED ORDER — GLUCAGON HCL (RDNA) 1 MG IJ SOLR
INTRAMUSCULAR | Status: AC
  Filled 2013-04-07: qty 1

## 2013-04-07 MED ORDER — LABETALOL HCL 5 MG/ML IV SOLN
INTRAVENOUS | Status: AC
  Filled 2013-04-07: qty 4

## 2013-04-07 MED ORDER — SUCCINYLCHOLINE CHLORIDE 20 MG/ML IJ SOLN
INTRAMUSCULAR | Status: DC | PRN
  Administered 2013-04-07: 100 mg via INTRAVENOUS

## 2013-04-07 MED ORDER — METOPROLOL SUCCINATE ER 25 MG PO TB24
25.0000 mg | ORAL_TABLET | Freq: Two times a day (BID) | ORAL | Status: DC
  Administered 2013-04-07 (×2): 25 mg via ORAL
  Filled 2013-04-07 (×4): qty 1

## 2013-04-07 MED ORDER — ESMOLOL HCL 10 MG/ML IV SOLN
INTRAVENOUS | Status: AC
  Filled 2013-04-07: qty 10

## 2013-04-07 MED ORDER — LACTATED RINGERS IV SOLN
INTRAVENOUS | Status: DC | PRN
  Administered 2013-04-07 (×2): via INTRAVENOUS

## 2013-04-07 MED ORDER — LABETALOL HCL 5 MG/ML IV SOLN
INTRAVENOUS | Status: DC | PRN
  Administered 2013-04-07 (×3): 2.5 mg via INTRAVENOUS
  Administered 2013-04-07: 5 mg via INTRAVENOUS

## 2013-04-07 MED ORDER — SODIUM CHLORIDE 0.9 % IV SOLN: INTRAVENOUS | Status: DC

## 2013-04-07 MED ORDER — AMLODIPINE BESYLATE 5 MG PO TABS
5.0000 mg | ORAL_TABLET | Freq: Every day | ORAL | Status: DC
  Administered 2013-04-07 – 2013-04-10 (×4): 5 mg via ORAL
  Filled 2013-04-07 (×4): qty 1

## 2013-04-07 MED ORDER — TRAMADOL HCL 50 MG PO TABS: 50.0000 mg | ORAL_TABLET | Freq: Three times a day (TID) | ORAL | Status: DC | PRN

## 2013-04-07 MED ORDER — LACTATED RINGERS IV SOLN: INTRAVENOUS | Status: DC

## 2013-04-07 NOTE — Progress Notes (Signed)
Anna Farrell 9:06 AM  Subjective: Patient is doing much better with a little abdominal soreness but no nausea vomiting or pain and wants  to eat  Objective: Vital signs stable afebrile no acute distress abdomen is soft nontender good bowel sounds labs improved except for bilirubin INR increased  Assessment: Probable gallstone pancreatitis improved  Plan: I had a long talk with the patient and her daughter regarding EUS versus ERCP including the risks benefits and methods and how she cannot do an MRCP due to her pacemaker and we also discussed seeing if she improves and waiting for further attacks which she does not like that plan as discussed her case with Dr. Ardis Hughs who does EUS and he is willing to proceed today and if positive I will proceed with an ERCP [facility]

## 2013-04-07 NOTE — Transfer of Care (Signed)
Immediate Anesthesia Transfer of Care Note  Patient: Anna Farrell  Procedure(s) Performed: Procedure(s): ENDOSCOPIC RETROGRADE CHOLANGIOPANCREATOGRAPHY (ERCP) (N/A) UPPER ENDOSCOPIC ULTRASOUND (EUS) RADIAL  Patient Location: PACU  Anesthesia Type:General  Level of Consciousness: awake and sedated  Airway & Oxygen Therapy: Patient Spontanous Breathing and Patient connected to face mask oxygen  Post-op Assessment: Report given to PACU RN and Post -op Vital signs reviewed and stable  Post vital signs: stable  Complications: No apparent anesthesia complications

## 2013-04-07 NOTE — OR Nursing (Signed)
Dr Watt Climes requested that the FFP be infused before procedure requiring a more rapid infusion.

## 2013-04-07 NOTE — Anesthesia Preprocedure Evaluation (Addendum)
Anesthesia Evaluation  Patient identified by MRN, date of birth, ID band Patient awake    Reviewed: Allergy & Precautions, H&P , NPO status , Patient's Chart, lab work & pertinent test results  Airway Mallampati: II TM Distance: >3 FB Neck ROM: Full    Dental  (+) Upper Dentures, Lower Dentures   Pulmonary shortness of breath, asthma ,  breath sounds clear to auscultation  Pulmonary exam normal       Cardiovascular hypertension, Pt. on medications and Pt. on home beta blockers + pacemaker Rhythm:Regular Rate:Normal  She states that her heart rate is "all over the place" and she was told that was OK   Neuro/Psych negative neurological ROS  negative psych ROS   GI/Hepatic negative GI ROS, Neg liver ROS,   Endo/Other  negative endocrine ROS  Renal/GU negative Renal ROS  negative genitourinary   Musculoskeletal negative musculoskeletal ROS (+)   Abdominal   Peds negative pediatric ROS (+)  Hematology  (+) anemia ,   Anesthesia Other Findings   Reproductive/Obstetrics negative OB ROS                         Anesthesia Physical Anesthesia Plan  ASA: III  Anesthesia Plan: General   Post-op Pain Management:    Induction: Intravenous  Airway Management Planned: Oral ETT  Additional Equipment:   Intra-op Plan:   Post-operative Plan: Extubation in OR  Informed Consent: I have reviewed the patients History and Physical, chart, labs and discussed the procedure including the risks, benefits and alternatives for the proposed anesthesia with the patient or authorized representative who has indicated his/her understanding and acceptance.   Dental advisory given  Plan Discussed with: CRNA  Anesthesia Plan Comments:         Anesthesia Quick Evaluation

## 2013-04-07 NOTE — Anesthesia Postprocedure Evaluation (Signed)
  Anesthesia Post-op Note  Patient: Anna Farrell  Procedure(s) Performed: Procedure(s) (LRB): ENDOSCOPIC RETROGRADE CHOLANGIOPANCREATOGRAPHY (ERCP) (N/A) UPPER ENDOSCOPIC ULTRASOUND (EUS) RADIAL  Patient Location: PACU  Anesthesia Type: General  Level of Consciousness: awake and alert   Airway and Oxygen Therapy: Patient Spontanous Breathing  Post-op Pain: mild  Post-op Assessment: Post-op Vital signs reviewed, Patient's Cardiovascular Status Stable, Respiratory Function Stable, Patent Airway and No signs of Nausea or vomiting  Last Vitals:  Filed Vitals:   04/07/13 1510  BP: 173/101  Pulse: 115  Temp: 37.3 C  Resp: 19    Post-op Vital Signs: stable   Complications: No apparent anesthesia complications

## 2013-04-07 NOTE — Progress Notes (Signed)
PACU charge Linna Hoff notified about patient not receiving 2 units FFP before procedure yet due to order for products recently being placed in [employer] and Pt still needing type and cross. Told by Linna Hoff to go ahead and let transporters bring patient down and they will begin FFP their. Patient traveling to [facility] at present time.

## 2013-04-07 NOTE — Progress Notes (Signed)
Note: This document was prepared with digital dictation and possible smart phrase technology. Any transcriptional errors that result from this process are unintentional.   Anna Farrell:[id number] DOB: 06-Aug-1927 DOA: 04/06/2013 PCP: [name], MD  Brief narrative: 80 y/o ?, known h/o of Tachy/brady syndrome s/p St. Jude PPM 2008, Chronic afib on coumadin, elective cholecsytectomy 09/17/11, prior L1 vertebroplasty, Mod persistent asthma from passive smoking h/o admitted  04/06/13 with severe abd pain  Course in the ED  Patient was tachycardic to 120s, had temperature of 10 35F and mildly tachypneic. She had abdominal tenderness. Blood work done showed marked transaminitis with AST of 1238 and ALT of 607, alkaline phosphatase of 226, total bilirubin of 1.6. She had mild leukocytosis. INR was 2.4. Her lipase was 202 and lactic acid of 2.79. UA was unremarkable. Blood cultures ordered from the ED and patient given one dose of IV Zosyn.  A CT scan of the abdomen and pelvis done in the ED showed mild intra-and extrahepatic biliary dilatation. Also sort of mild colonic wall thickening possibly reflecting mild colitis   Past medical history-As per Problem list Chart reviewed as below-   Consultants:  GI  Procedures:   Pending  Antibiotics:  Flagyl 3/31-4/1 Zosyn 3/31 ???    Subjective  Less pain in abdomen No n/v/cp No sob currently. Back pain more concerning than abd pain States it is like and ache Not aware of elevated Hr and doesn't;t feel palpitation   Objective    Interim History: none  Telemetry: Afib, rates 100-120   Objective: Filed Vitals:   04/07/13 0400 04/07/13 0600 04/07/13 0800 04/07/13 1006  BP: 128/90 146/100 137/79 153/74  Pulse:  113    Temp: 98.4 F (36.9 C)  99.8 F (37.7 C) 99.4 F (37.4 C)  TempSrc: Oral  Oral Oral  Resp: 29 20 22 26   Height:      Weight: 64.6 kg (142 lb 6.7 oz)     SpO2: 98% 94% 94% 95%    Intake/Output Summary  (Last 24 hours) at 04/07/13 1049 Last data filed at 04/07/13 1000  Gross per 24 hour  Intake   2525 ml  Output    625 ml  Net   1900 ml    Exam:  General: eomi, ncat,  Cardiovascular: irreg irreg, M noted LLSE grade 4/6 Respiratory: clear,  Abdomen: soft, nondistended Skin no LE edema Neuro intact  Data Reviewed: Basic Metabolic Panel:  Recent Labs Lab 04/06/13 0400 04/07/13 0343  NA 141 143  K 3.7 3.3*  CL 101 107  CO2 26 25  GLUCOSE 152* 111*  BUN 13 14  CREATININE 0.51 0.74  CALCIUM 9.6 8.5   Liver Function Tests:  Recent Labs Lab 04/06/13 0400 04/07/13 0343  AST 1238* 334*  ALT 607* 378*  ALKPHOS 326* 206*  BILITOT 1.6* 3.5*  PROT 7.1 5.4*  ALBUMIN 3.5 2.7*    Recent Labs Lab 04/06/13 0400 04/07/13 0343  LIPASE 202* 59   No results found for this basename: AMMONIA,  in the last 168 hours CBC:  Recent Labs Lab 04/06/13 0400 04/07/13 0343  WBC 10.7* 11.8*  NEUTROABS 9.8* 10.3*  HGB 14.9 11.8*  HCT 44.4 37.2  MCV 93.9 95.9  PLT 231 165   Cardiac Enzymes: No results found for this basename: CKTOTAL, CKMB, CKMBINDEX, TROPONINI,  in the last 168 hours BNP: No components found with this basename: POCBNP,  CBG: No results found for this basename: GLUCAP,  in the last 168  hours  Recent Results (from the past 240 hour(s))  CULTURE, BLOOD (ROUTINE X 2)     Status: None   Collection Time    04/06/13  6:56 AM      Result Value Ref Range Status   Specimen Description BLOOD LEFT HAND   Final   Special Requests BOTTLES DRAWN AEROBIC AND ANAEROBIC 3CC   Final   Culture  Setup Time     Final   Value: 04/06/2013 10:11     Performed at [facility]   Culture     Final   Value:        BLOOD CULTURE RECEIVED NO GROWTH TO DATE CULTURE WILL BE HELD FOR 5 DAYS BEFORE ISSUING A FINAL NEGATIVE REPORT     Performed at [facility]   Report Status PENDING   Incomplete  CULTURE, BLOOD (ROUTINE X 2)     Status: None   Collection Time     04/06/13  7:09 AM      Result Value Ref Range Status   Specimen Description BLOOD RIGHT HAND   Final   Special Requests BOTTLES DRAWN AEROBIC AND ANAEROBIC 3CC   Final   Culture  Setup Time     Final   Value: 04/06/2013 10:11     Performed at [facility]   Culture     Final   Value:        BLOOD CULTURE RECEIVED NO GROWTH TO DATE CULTURE WILL BE HELD FOR 5 DAYS BEFORE ISSUING A FINAL NEGATIVE REPORT     Performed at [facility]   Report Status PENDING   Incomplete  MRSA PCR SCREENING     Status: None   Collection Time    04/06/13  8:03 AM      Result Value Ref Range Status   MRSA by PCR NEGATIVE  NEGATIVE Final   Comment:            The GeneXpert MRSA Assay (FDA     approved for NASAL specimens     only), is one component of a     comprehensive MRSA colonization     surveillance program. It is not     intended to diagnose MRSA     infection nor to guide or     monitor treatment for     MRSA infections.     Studies:              All Imaging reviewed and is as per above notation   Scheduled Meds: . amLODipine  5 mg Oral Daily  . latanoprost  1 drop Both Eyes QHS  . lidocaine  1 patch Transdermal Q24H  . metoprolol succinate  25 mg Oral BID  . piperacillin-tazobactam (ZOSYN)  IV  3.375 g Intravenous 3 times per day  . sodium chloride  3 mL Intravenous Q12H  . traMADol  100 mg Oral Q8H   Continuous Infusions: . sodium chloride 50 mL/hr at 04/07/13 0959  . sodium chloride       Assessment/Plan:  SIRS with Acute cholangitis  Patient presented with fever, tachycardia and tachypnea he can criteria for SIRS. Patient has cholangitis with findings of common bile duct dilate patient and significant transaminitis secondary to choledocholithiasis. Patient had laparoscopic cholecystectomy in 2013. She also has mild pancreatitis as well..  -Empiric antibiotics with IV Zosyn.  Blood culture 3/31 pending -Clear liquids. Supportive care with when necessary tramadol  and Zofran for nausea. [Nausea with opiates] -Monitor LFTs  closely-trending downwards curently -Appreciate GI consult. Eus +/- ERCP maybe today as per their instruction -Type and screen-FFP as per GI  Acute pancreatitis  Secondary to choledocholithiasis.Monitor LFTs. Lipase has normalized Acute colitis  CT scan of the abdomen suggestive of mild acute colitis. Patient on empiric Zosyn .  HYPERTENSION  Re-started PO metoprolol 12.5 bid.  MOnitor on tele-start Cardizem if HR above 120 sustained Chronic atrial fibrillation  Patient in A. fib upon presentation. INR therapeutic. Hold Coumadin.  History of pacemaker  c Tachy-brady syndrome Stable currently    Code Status: FULL Discussed with daughter bedaside Disposition Plan: likely SDU 1-2 more days  Verneita Griffes, MD  [facility] Pager [phone] 04/07/2013, 10:49 AM    LOS: 1 day

## 2013-04-07 NOTE — Op Note (Signed)
[place], [address]   ENDOSCOPIC ULTRASOUND PROCEDURE REPORT  PATIENT: Anna Farrell, Anna Farrell  MR#: [id number] BIRTHDATE: 1927/03/11  GENDER: Female ENDOSCOPIST: Milus Banister, MD REFERRED BY:  Clarene Essex, M.D. PROCEDURE DATE:  04/07/2013 PROCEDURE:   Upper EUS ASA CLASS:      Class III INDICATIONS:   1.  recent Acute pancreatitis, ? CBD stones. MEDICATIONS: General endotracheal anesthesia (GETA)  DESCRIPTION OF PROCEDURE:   After the risks benefits and alternatives of the procedure were  explained, informed consent was obtained. The patient was then placed in the left, lateral, decubitus postion and IV sedation was administered. Throughout the procedure, the patients blood pressure, pulse and oxygen saturations were monitored continuously.  Under direct visualization, the Pentax Radial EUS [id number]  endoscope was introduced through the mouth  and advanced to the second portion of the duodenum .  Water was used as necessary to provide an acoustic interface.  Upon completion of the imaging, water was removed and the patient was sent to the recovery room in satisfactory condition.   Endoscopic findings: 1. Non-obstructing Schatzki's ring above 3cm hiatal hernia 2. Otherwise normal examination, views limited with radial echoendoscope.  EUS findings: 1. CBD was slightly dilated (6.89mm) and there was a hyperechoic, non-shadowing 1-64mm foci within the bile duct laying along mid bile duct wall. This is probably a small stone or debris (microlithiasis) in the bile duct. 2. Main pancreatic duct was normal, non-dilated 3. Pancreatic parenchyma was normal.  Impression: Very small non-shadowing stone, stone fragment or debris in the bile duct.  I recommend proceeding with the already planned ERCP.   _______________________________ eSigned:  Milus Banister, MD 04/07/2013 12:40 PM

## 2013-04-07 NOTE — Op Note (Signed)
[facility] [place], [address]   ERCP PROCEDURE REPORT  PATIENT: Anna, Farrell.  MR# :[id number] BIRTHDATE: 04-11-1927  GENDER: Female ENDOSCOPIST: Clarene Essex, MD REFERRED BY: PROCEDURE DATE:  04/07/2013 PROCEDURE:   ERCP with sphincterotomy/papillotomy and ERCP with removal of calculus/calculi ASA CLASS:  3 INDICATIONS: positive EUS for CBD stones in a patient with probable gallstone pancreatitis MEDICATIONS:   Gen. anesthesia TOPICAL ANESTHETIC: no  DESCRIPTION OF PROCEDURE:   After the risks benefits and alternatives of the procedure were thoroughly explained, informed consent was obtained.  The ercp pentax [id number]  endoscope was introduced through the mouth and advanced to the second portion of the duodenum .a normal appearing ampulla was brought into view But unfortunately with her on her side we were unable to cannulate the CBD so we rolled her on her belly and were able to get deep selective cannulation easily unfortunately the wire tended to go up the cystic duct and so we had to reposition the sphincter tone to get deep selective CBD cannulation and the findings are recorded below and the patient tolerated the procedure well there was no obvious immediate complication and no pancreatic duct injections or wireadvancements throughout  the procedure          COMPLICATIONS: none  ENDOSCOPIC IMPRESSION:1. Normal ampulla 2 no pancreatic duct wire advancements or injections throughout the procedure 3. Dilated CBD status post sphincterotomy and multiple adjustableballoon pull-throughs with a few flecks of stone debris only and no residual stone on occlusion cholangiogram at the end of the procedure  RECOMMENDATIONS:customary post-ERCP order and will follow with you and if no delayed complications may slowly advance diet tomorrow     _______________________________ eSigned:  Clarene Essex, MD 04/07/2013 1:55 PM   CC:

## 2013-04-08 ENCOUNTER — Encounter (HOSPITAL_COMMUNITY): Payer: Self-pay | Admitting: Gastroenterology

## 2013-04-08 DIAGNOSIS — R1011 Right upper quadrant pain: Secondary | ICD-10-CM | POA: Diagnosis not present

## 2013-04-08 DIAGNOSIS — K8050 Calculus of bile duct without cholangitis or cholecystitis without obstruction: Secondary | ICD-10-CM | POA: Diagnosis not present

## 2013-04-08 DIAGNOSIS — R10819 Abdominal tenderness, unspecified site: Secondary | ICD-10-CM | POA: Diagnosis not present

## 2013-04-08 DIAGNOSIS — R932 Abnormal findings on diagnostic imaging of liver and biliary tract: Secondary | ICD-10-CM | POA: Diagnosis not present

## 2013-04-08 DIAGNOSIS — R109 Unspecified abdominal pain: Secondary | ICD-10-CM | POA: Diagnosis not present

## 2013-04-08 DIAGNOSIS — K831 Obstruction of bile duct: Secondary | ICD-10-CM | POA: Diagnosis not present

## 2013-04-08 LAB — CBC
HCT: 35.3 % — ABNORMAL LOW (ref 36.0–46.0)
Hemoglobin: 11.4 g/dL — ABNORMAL LOW (ref 12.0–15.0)
MCH: 30.9 pg (ref 26.0–34.0)
MCHC: 32.3 g/dL (ref 30.0–36.0)
MCV: 95.7 fL (ref 78.0–100.0)
PLATELETS: 154 10*3/uL (ref 150–400)
RBC: 3.69 MIL/uL — ABNORMAL LOW (ref 3.87–5.11)
RDW: 13.7 % (ref 11.5–15.5)
WBC: 9.5 10*3/uL (ref 4.0–10.5)

## 2013-04-08 LAB — COMPREHENSIVE METABOLIC PANEL
ALBUMIN: 2.6 g/dL — AB (ref 3.5–5.2)
ALK PHOS: 192 U/L — AB (ref 39–117)
ALT: 242 U/L — ABNORMAL HIGH (ref 0–35)
AST: 196 U/L — AB (ref 0–37)
BUN: 10 mg/dL (ref 6–23)
CO2: 28 mEq/L (ref 19–32)
Calcium: 8.6 mg/dL (ref 8.4–10.5)
Chloride: 104 mEq/L (ref 96–112)
Creatinine, Ser: 0.56 mg/dL (ref 0.50–1.10)
GFR calc Af Amer: >90 mL/min (ref >90)
GFR calc non Af Amer: 84 mL/min — ABNORMAL LOW (ref >90)
Glucose, Bld: 105 mg/dL — ABNORMAL HIGH (ref 70–99)
POTASSIUM: 3.3 meq/L — AB (ref 3.7–5.3)
SODIUM: 141 meq/L (ref 137–147)
Total Bilirubin: 4.2 mg/dL — ABNORMAL HIGH (ref 0.3–1.2)
Total Protein: 5.5 g/dL — ABNORMAL LOW (ref 6.0–8.3)

## 2013-04-08 LAB — PREPARE FRESH FROZEN PLASMA
Unit division: 0
Unit division: 0

## 2013-04-08 LAB — TYPE AND SCREEN
ABO/RH(D): O POS
ANTIBODY SCREEN: NEGATIVE

## 2013-04-08 LAB — PROTIME-INR
INR: 1.43 (ref 0.00–1.49)
PROTHROMBIN TIME: 17.1 s — AB (ref 11.6–15.2)

## 2013-04-08 MED ORDER — WARFARIN SODIUM 5 MG PO TABS
5.0000 mg | ORAL_TABLET | Freq: Once | ORAL | Status: AC
  Administered 2013-04-08: 5 mg via ORAL
  Filled 2013-04-08: qty 1

## 2013-04-08 MED ORDER — METOPROLOL SUCCINATE ER 50 MG PO TB24
50.0000 mg | ORAL_TABLET | Freq: Every day | ORAL | Status: DC
  Administered 2013-04-08 – 2013-04-10 (×3): 50 mg via ORAL
  Filled 2013-04-08 (×4): qty 1

## 2013-04-08 MED ORDER — WARFARIN - PHARMACIST DOSING INPATIENT: Freq: Every day | Status: DC

## 2013-04-08 MED ORDER — TRAMADOL HCL 50 MG PO TABS
100.0000 mg | ORAL_TABLET | ORAL | Status: DC | PRN
  Administered 2013-04-08 – 2013-04-09 (×2): 100 mg via ORAL
  Filled 2013-04-08 (×2): qty 2

## 2013-04-08 NOTE — Progress Notes (Signed)
ANTICOAGULATION CONSULT NOTE - Initial Consult  Pharmacy Consult for Warfarin Indication: atrial fibrillation  Allergies  Allergen Reactions  . Codeine Nausea And Vomiting    Passed out    Patient Measurements: Height: 5\' 2"  (157.5 cm) Weight: 144 lb 13.5 oz (65.7 kg) IBW/kg (Calculated) : 50.1  Vital Signs: Temp: 99.9 F (37.7 C) (04/02 1249) Temp src: Oral (04/02 1249) BP: 148/94 mmHg (04/02 1249) Pulse Rate: 107 (04/02 1249)  Labs:  Recent Labs  04/06/13 0400 04/06/13 0410 04/07/13 0343 04/08/13 0320 04/08/13 1403  HGB 14.9  --  11.8* 11.4*  --   HCT 44.4  --  37.2 35.3*  --   PLT 231  --  165 154  --   LABPROT  --  25.4* 27.9*  --  17.1*  INR  --  2.40* 2.72*  --  1.43  CREATININE 0.51  --  0.74 0.56  --     Estimated Creatinine Clearance: 47.4 ml/min (by C-G formula based on Cr of 0.56).   Medical History: Past Medical History  Diagnosis Date  . Vasovagal syncope   . Hypertension   . Hyperlipidemia   . Orthostatic hypotension   . Osteoporosis   . Pacemaker 10/11/06    implanted by Dr Leonia Reeves for pauses > 4 seconds with afib  . Iron deficiency anemia   . Shortness of breath   . Arthritis   . Colitis 5/13  . Chronic low back pain 2008    crushed vertebrae  . Tachycardia-bradycardia syndrome   . Permanent atrial fibrillation   . Asymptomatic PVCs   . History of colonic polyps   . Pure hypercholesterolemia   . Extrinsic asthma, unspecified     LOV Dr Joya Gaskins 7/12  . Long term (current) use of anticoagulants   . Cholelithiasis   . Generalized osteoarthrosis, unspecified site      Assessment: 51 yoF on chronic warfarin for permanent atrial fibrillation presented to ED 3/31 with severe central epigastric tenderness.  PMH also significant for h/o bradycardia/tachycardia syndrome s/p PPM (SJM).  Patient admitted for SIRS with acute cholangitis, acute pancreatitis, acute colitis.  GI following and notes improved gallstone pancreatitis status post EUS  and ERCP and sphincterotomy 4/1.  FFP given prior to ERCP.  Warfarin ordered to be resumed 4/2.  PTA warfarin dose = 3 mg daily except takes 4.5 mg on Thursdays  Today's INR 1.43.  Last warfarin dose 3/30.  Clear liquid diet started 4/1 PM.  No bleeding reported.  Hgb low but stable.  Platelets WNL.  Goal of Therapy:  INR 2-3   Plan:  1.  Warfarin 5 mg PO once today. 2.  Daily INR.  Hershal Coria 04/08/2013,2:46 PM

## 2013-04-08 NOTE — Progress Notes (Signed)
Note: This document was prepared with digital dictation and possible smart phrase technology. Any transcriptional errors that result from this process are unintentional.   Anna Farrell NKN:[id number] DOB: [birth year] DOA: 04/06/2013 PCP: [name], MD  Brief narrative: 80 y/o ?, known h/o of Tachy/brady syndrome s/p [employer] PPM 2008, Chronic afib on coumadin, elective cholecsytectomy 09/17/11, prior L1 vertebroplasty, Mod persistent asthma from passive smoking h/o admitted  04/06/13 with severe abd pain   Initially on admission Patient was tachycardic to 120s, had temperature of 101F and mildly tachypneic. She had abdominal tenderness. Blood work done showed marked transaminitis with AST of 1238 and ALT of 607, alkaline phosphatase of 226, total bilirubin of 1.6. She had mild leukocytosis. INR was 2.4. Her lipase was 202 and lactic acid of 2.79. UA was unremarkable. Blood cultures ordered from the ED and patient given one dose of IV Zosyn.  A CT scan of the abdomen and pelvis done in the ED showed mild intra-and extrahepatic biliary dilatation. Also sort of mild colonic wall thickening possibly reflecting mild colitis  ultimately GI saw the patient and determined that she might benefit from ERCP/EUS as below.  Subsequently her LFT's trended downwards   Past medical history-As per Problem list Chart reviewed as below-   Consultants:  GI  Procedures:  EUS 04/08/13 Endoscopic findings:  1. Non-obstructing Schatzki's ring above 3cm hiatal hernia  2. Otherwise normal examination, views limited with radial  echoendoscope.   EUS findings:  1. CBD was slightly dilated (6.38mm) and there was a hyperechoic,  non-shadowing 1-58mm foci within the bile duct laying along mid bile  duct wall. This is probably a small stone or debris  (microlithiasis) in the bile duct.  2. Main pancreatic duct was normal, non-dilated  3. Pancreatic parenchyma was normal.  ERCP 04/08/13 ENDOSCOPIC IMPRESSION:1.  Normal ampulla 2 no pancreatic duct wire  advancements or injections throughout the procedure 3. Dilated CBD  status post sphincterotomy and multiple adjustableballoon  pull-throughs with a few flecks of stone debris only and no  residual stone on occlusion cholangiogram at the end of the  procedure   Antibiotics:  Flagyl 3/31-4/1 Zosyn 3/31 ???    Subjective  Less pain in abdomen tol clear liquids fairly well Some mild pain in back, but no n/v/cp/sob NO fevers or chills    Objective    Interim History: none  Telemetry: Afib, rates 100-120   Objective: Filed Vitals:   04/08/13 0402 04/08/13 0500 04/08/13 0600 04/08/13 0700  BP:      Pulse:  89 40 93  Temp: 98.3 F (36.8 C)     TempSrc: Oral     Resp:  22 25 16   Height:      Weight: 65.7 kg (144 lb 13.5 oz)     SpO2:  96% 98% 96%    Intake/Output Summary (Last 24 hours) at 04/08/13 1004 Last data filed at 04/08/13 0800  Gross per 24 hour  Intake 2853.75 ml  Output   1525 ml  Net 1328.75 ml    Exam:  General: eomi, ncat,  Cardiovascular: irreg irreg, M noted LLSE grade 4/6 Respiratory: clear,  Abdomen: soft, nondistended Skin no LE edema Neuro intact  Data Reviewed: Basic Metabolic Panel:  Recent Labs Lab 04/06/13 0400 04/07/13 0343 04/08/13 0320  NA 141 143 141  K 3.7 3.3* 3.3*  CL 101 107 104  CO2 26 25 28   GLUCOSE 152* 111* 105*  BUN 13 14 10   CREATININE 0.51 0.74 0.56  CALCIUM 9.6 8.5 8.6   Liver Function Tests:  Recent Labs Lab 04/06/13 0400 04/07/13 0343 04/08/13 0320  AST 1238* 334* 196*  ALT 607* 378* 242*  ALKPHOS 326* 206* 192*  BILITOT 1.6* 3.5* 4.2*  PROT 7.1 5.4* 5.5*  ALBUMIN 3.5 2.7* 2.6*    Recent Labs Lab 04/06/13 0400 04/07/13 0343  LIPASE 202* 59   No results found for this basename: AMMONIA,  in the last 168 hours CBC:  Recent Labs Lab 04/06/13 0400 04/07/13 0343 04/08/13 0320  WBC 10.7* 11.8* 9.5  NEUTROABS 9.8* 10.3*  --   HGB 14.9 11.8*  11.4*  HCT 44.4 37.2 35.3*  MCV 93.9 95.9 95.7  PLT 231 165 154   Cardiac Enzymes: No results found for this basename: CKTOTAL, CKMB, CKMBINDEX, TROPONINI,  in the last 168 hours BNP: No components found with this basename: POCBNP,  CBG: No results found for this basename: GLUCAP,  in the last 168 hours  Recent Results (from the past 240 hour(s))  CULTURE, BLOOD (ROUTINE X 2)     Status: None   Collection Time    04/06/13  6:56 AM      Result Value Ref Range Status   Specimen Description BLOOD LEFT HAND   Final   Special Requests BOTTLES DRAWN AEROBIC AND ANAEROBIC 3CC   Final   Culture  Setup Time     Final   Value: 04/06/2013 10:11     Performed at [facility]   Culture     Final   Value:        BLOOD CULTURE RECEIVED NO GROWTH TO DATE CULTURE WILL BE HELD FOR 5 DAYS BEFORE ISSUING A FINAL NEGATIVE REPORT     Performed at [facility]   Report Status PENDING   Incomplete  CULTURE, BLOOD (ROUTINE X 2)     Status: None   Collection Time    04/06/13  7:09 AM      Result Value Ref Range Status   Specimen Description BLOOD RIGHT HAND   Final   Special Requests BOTTLES DRAWN AEROBIC AND ANAEROBIC 3CC   Final   Culture  Setup Time     Final   Value: 04/06/2013 10:11     Performed at [facility]   Culture     Final   Value:        BLOOD CULTURE RECEIVED NO GROWTH TO DATE CULTURE WILL BE HELD FOR 5 DAYS BEFORE ISSUING A FINAL NEGATIVE REPORT     Performed at [facility]   Report Status PENDING   Incomplete  MRSA PCR SCREENING     Status: None   Collection Time    04/06/13  8:03 AM      Result Value Ref Range Status   MRSA by PCR NEGATIVE  NEGATIVE Final   Comment:            The GeneXpert MRSA Assay (FDA     approved for NASAL specimens     only), is one component of a     comprehensive MRSA colonization     surveillance program. It is not     intended to diagnose MRSA     infection nor to guide or     monitor treatment for     MRSA  infections.     Studies:              All Imaging reviewed and is as per above notation   Scheduled Meds: .  amLODipine  5 mg Oral Daily  . latanoprost  1 drop Both Eyes QHS  . lidocaine  1 patch Transdermal Q24H  . metoprolol succinate  25 mg Oral BID  . piperacillin-tazobactam (ZOSYN)  IV  3.375 g Intravenous 3 times per day  . sodium chloride  3 mL Intravenous Q12H   Continuous Infusions: . sodium chloride 50 mL/hr at 04/07/13 1510     Assessment/Plan:  SIRS with Acute cholangitis  Patient presented with fever, tachycardia and tachypnea he can criteria for SIRS. Patient has cholangitis with findings of common bile duct dilate patient and significant transaminitis secondary to choledocholithiasis. Patient had laparoscopic cholecystectomy in 2013.  -Empiric antibiotics with IV Zosyn.  Blood culture 3/31 beg so far -Clear liquids. Supportive care with when necessary tramadol q4 prn and Zofran for nausea. [Nausea with opiates] -Monitor LFTs closely-trending downwards currently now -Appreciate GI consult. And input.  ERCP and EUS non suggestive of blockage/stone, so continue IV antobiotics with aim to arrow in 1-2 days   Acute pancreatitis  Secondary to choledocholithiasis.Monitor LFTs. Lipase has normalized and resolved.  Grad diet as per GI Acute colitis  CT scan of the abdomen suggestive of mild acute colitis. Patient on empiric Zosyn .  HYPERTENSION  Re-started PO metoprolol 12.5 bid, ???increased to xl 50 mg daily.  Monitor on tele-start Cardizem if HR above 120 sustained.   Continue Amlodipine 5 daily [might adjust for need to increase Metoprolol in am] Chronic atrial fibrillation  Patient in A. fib upon presentation.  INR therapeutic. Hold Coumadin.  History of pacemaker  c Tachy-brady syndrome Stable currently Impaired glucose tolerance Monitor    Code Status: FULL Discussed with son at bedaside Disposition Plan: tx to tele  Verneita Griffes, MD  [facility] Pager [phone] 04/08/2013, 10:04 AM    LOS: 2 days

## 2013-04-08 NOTE — Progress Notes (Signed)
Anna Farrell 12:39 PM  Subjective: Patient feeling better today with only minimal abdominal soreness and wants to advance her diet and is passing gas and has no new complaints  Objective: Vital signs stable afebrile no acute distress abdomen is soft nontender labs improved except for bilirubin slight increase probably due to distal duct edema or spasm after ERCP  Assessment: Improved gallstone pancreatitis status post EUS and ERCP and sphincterotomy  Plan: Will slowly advance diet and repeat labs in a.m. and call us if we can be of any further assistance  [facility] E

## 2013-04-09 DIAGNOSIS — K8309 Other cholangitis: Secondary | ICD-10-CM | POA: Diagnosis not present

## 2013-04-09 DIAGNOSIS — R1084 Generalized abdominal pain: Secondary | ICD-10-CM | POA: Diagnosis not present

## 2013-04-09 DIAGNOSIS — K8050 Calculus of bile duct without cholangitis or cholecystitis without obstruction: Secondary | ICD-10-CM | POA: Diagnosis not present

## 2013-04-09 DIAGNOSIS — K859 Acute pancreatitis without necrosis or infection, unspecified: Secondary | ICD-10-CM | POA: Diagnosis not present

## 2013-04-09 DIAGNOSIS — R109 Unspecified abdominal pain: Secondary | ICD-10-CM | POA: Diagnosis not present

## 2013-04-09 DIAGNOSIS — R6510 Systemic inflammatory response syndrome (SIRS) of non-infectious origin without acute organ dysfunction: Secondary | ICD-10-CM | POA: Diagnosis not present

## 2013-04-09 LAB — CBC WITH DIFFERENTIAL/PLATELET
BASOS PCT: 0 % (ref 0–1)
Basophils Absolute: 0 10*3/uL (ref 0.0–0.1)
EOS ABS: 0.3 10*3/uL (ref 0.0–0.7)
Eosinophils Relative: 4 % (ref 0–5)
HEMATOCRIT: 34.1 % — AB (ref 36.0–46.0)
Hemoglobin: 11 g/dL — ABNORMAL LOW (ref 12.0–15.0)
LYMPHS ABS: 0.7 10*3/uL (ref 0.7–4.0)
Lymphocytes Relative: 10 % — ABNORMAL LOW (ref 12–46)
MCH: 30.6 pg (ref 26.0–34.0)
MCHC: 32.3 g/dL (ref 30.0–36.0)
MCV: 94.7 fL (ref 78.0–100.0)
MONO ABS: 0.8 10*3/uL (ref 0.1–1.0)
MONOS PCT: 10 % (ref 3–12)
Neutro Abs: 5.7 10*3/uL (ref 1.7–7.7)
Neutrophils Relative %: 76 % (ref 43–77)
Platelets: 151 10*3/uL (ref 150–400)
RBC: 3.6 MIL/uL — AB (ref 3.87–5.11)
RDW: 13.6 % (ref 11.5–15.5)
WBC: 7.5 10*3/uL (ref 4.0–10.5)

## 2013-04-09 LAB — PROTIME-INR
INR: 1.64 — ABNORMAL HIGH (ref 0.00–1.49)
Prothrombin Time: 19 seconds — ABNORMAL HIGH (ref 11.6–15.2)

## 2013-04-09 LAB — COMPREHENSIVE METABOLIC PANEL
ALT: 164 U/L — AB (ref 0–35)
AST: 85 U/L — ABNORMAL HIGH (ref 0–37)
Albumin: 2.4 g/dL — ABNORMAL LOW (ref 3.5–5.2)
Alkaline Phosphatase: 169 U/L — ABNORMAL HIGH (ref 39–117)
BUN: 9 mg/dL (ref 6–23)
CO2: 28 mEq/L (ref 19–32)
Calcium: 8.5 mg/dL (ref 8.4–10.5)
Chloride: 100 mEq/L (ref 96–112)
Creatinine, Ser: 0.55 mg/dL (ref 0.50–1.10)
GFR calc Af Amer: >90 mL/min (ref >90)
GFR calc non Af Amer: 84 mL/min — ABNORMAL LOW (ref >90)
Glucose, Bld: 123 mg/dL — ABNORMAL HIGH (ref 70–99)
Potassium: 2.9 mEq/L — CL (ref 3.7–5.3)
SODIUM: 138 meq/L (ref 137–147)
TOTAL PROTEIN: 5.5 g/dL — AB (ref 6.0–8.3)
Total Bilirubin: 1.5 mg/dL — ABNORMAL HIGH (ref 0.3–1.2)

## 2013-04-09 LAB — MAGNESIUM: Magnesium: 1.9 mg/dL (ref 1.5–2.5)

## 2013-04-09 MED ORDER — POTASSIUM CHLORIDE IN NACL 40-0.9 MEQ/L-% IV SOLN
INTRAVENOUS | Status: DC
  Administered 2013-04-09: 05:00:00 via INTRAVENOUS
  Filled 2013-04-09: qty 1000

## 2013-04-09 MED ORDER — ACETAMINOPHEN 325 MG PO TABS
650.0000 mg | ORAL_TABLET | Freq: Once | ORAL | Status: AC
  Administered 2013-04-09: 650 mg via ORAL
  Filled 2013-04-09: qty 2

## 2013-04-09 MED ORDER — POTASSIUM CHLORIDE CRYS ER 20 MEQ PO TBCR
40.0000 meq | EXTENDED_RELEASE_TABLET | Freq: Once | ORAL | Status: AC
  Administered 2013-04-09: 40 meq via ORAL
  Filled 2013-04-09: qty 2

## 2013-04-09 MED ORDER — WARFARIN SODIUM 5 MG PO TABS
5.0000 mg | ORAL_TABLET | Freq: Once | ORAL | Status: AC
  Administered 2013-04-09: 5 mg via ORAL
  Filled 2013-04-09: qty 1

## 2013-04-09 NOTE — Progress Notes (Signed)
Received alert from CMT that patient's pacer undersensing. Performed EKG, EKG showed no pacer spikes and read "suspect unspecified pacemaker failure." Patient also in a-fib, which is consistent with her history. Notified MD on call for [facility], Dr. Rockne Menghini who said to place transcutaneous pacer pads on patient but nothing else at this time. Patient asymptomatic. Will continue to monitor.

## 2013-04-09 NOTE — Progress Notes (Signed)
Per MD request [employer] contacted in regards to pacemaker.  Contact information for [place] [phone].  Area rep was paged. Braulio Kiedrowski A

## 2013-04-09 NOTE — Progress Notes (Signed)
ANTICOAGULATION CONSULT NOTE - Follow Up Consult  Pharmacy Consult for Warfarin Indication: atrial fibrillation  Allergies  Allergen Reactions  . Codeine Nausea And Vomiting    Passed out    Patient Measurements: Height: 5\' 2"  (157.5 cm) Weight: 144 lb 13.5 oz (65.7 kg) IBW/kg (Calculated) : 50.1  Vital Signs: Temp: 98 F (36.7 C) (04/03 0407) Temp src: Oral (04/03 0407) BP: 135/84 mmHg (04/03 0626) Pulse Rate: 102 (04/03 0626)  Labs:  Recent Labs  04/07/13 0343 04/08/13 0320 04/08/13 1403 04/09/13 0405  HGB 11.8* 11.4*  --  11.0*  HCT 37.2 35.3*  --  34.1*  PLT 165 154  --  151  LABPROT 27.9*  --  17.1* 19.0*  INR 2.72*  --  1.43 1.64*  CREATININE 0.74 0.56  --  0.55    Estimated Creatinine Clearance: 47.4 ml/min (by C-G formula based on Cr of 0.55).   Medical History: Past Medical History  Diagnosis Date  . Vasovagal syncope   . Hypertension   . Hyperlipidemia   . Orthostatic hypotension   . Osteoporosis   . Pacemaker 10/11/06    implanted by Dr Leonia Reeves for pauses > 4 seconds with afib  . Iron deficiency anemia   . Shortness of breath   . Arthritis   . Colitis 5/13  . Chronic low back pain 2008    crushed vertebrae  . Tachycardia-bradycardia syndrome   . Permanent atrial fibrillation   . Asymptomatic PVCs   . History of colonic polyps   . Pure hypercholesterolemia   . Extrinsic asthma, unspecified     LOV Dr Joya Gaskins 7/12  . Long term (current) use of anticoagulants   . Cholelithiasis   . Generalized osteoarthrosis, unspecified site      Assessment: 84 yoF on chronic warfarin for permanent atrial fibrillation presented to ED 3/31 with severe central epigastric tenderness.  PMH also significant for h/o bradycardia/tachycardia syndrome s/p PPM (SJM).  Patient admitted for SIRS with acute cholangitis, acute pancreatitis, acute colitis.  GI following and noted improved gallstone pancreatitis status post EUS and ERCP and sphincterotomy 4/1.  FFP given  prior to ERCP.  Warfarin ordered to be resumed 4/2.  PTA warfarin dose = 3 mg daily except takes 4.5 mg on Thursdays  Today's INR 1.64.  Warfarin resume yesterday.  Clear liquid diet started 4/1 PM.  No bleeding reported.  Hgb low but stable.  Platelets 151K.  Goal of Therapy:  INR 2-3   Plan:  1.  Warfarin 5 mg PO once today. 2.  Daily INR.  Hershal Coria 04/09/2013,8:41 AM

## 2013-04-09 NOTE — Evaluation (Addendum)
Physical Therapy Evaluation Patient Details Name: Anna Farrell MRN: [id number] DOB: 29-May-1927 Today's Date: 04/09/2013   History of Present Illness  80 yo female admitted with acute cholangitis. Hx of Afib, HTN, pacemaker, tachy/brady syndrome, vasovagal syncope,osteoporosis, chronic low back pain, L1 VP  Clinical Impression  On eval, pt required Min guard assist for mobility-able to ambulate ~125 feet without assistive device. Gait was very guarded and cautious. Recommended pt use RW until stability and general strength improve. Discussed HHPT-pt's granddaughter is a PTA with [employer]-they feel they can consult with her if needed. Encouraged pt to ambulate a 2nd time today with supervision.     Follow Up Recommendations No PT follow up (discussed HHPT-pt does not feel it is needed; also pt's granddaughter is a PTA with [employer].-she can help with therapy if needed)    Equipment Recommendations   (pt states she has walker)    Recommendations for Other Services       Precautions / Restrictions Precautions Precautions: Fall Restrictions Weight Bearing Restrictions: No      Mobility  Bed Mobility Overal bed mobility: Modified Independent                Transfers Overall transfer level: Needs assistance   Transfers: Sit to/from Stand Sit to Stand: Min guard         General transfer comment: close guard for safety  Ambulation/Gait Ambulation/Gait assistance: Min guard Ambulation Distance (Feet): 125 Feet Assistive device: None Gait Pattern/deviations: Step-through pattern;Decreased stride length     General Gait Details: very slow gait speed. pt very guarded/cautious. slightly unsteady but no LOB.   Stairs            Wheelchair Mobility    Modified Rankin (Stroke Patients Only)       Balance                                             Pertinent Vitals/Pain Abdominal pain 3/10    Home Living Family/patient expects to  be discharged to:: Private residence Living Arrangements: Alone Available Help at Discharge: Family;Available PRN/intermittently Type of Home: House       Home Layout: One level Home Equipment: Walker - 2 wheels      Prior Function Level of Independence: Independent         Comments: 2x/week cleaning lady     Hand Dominance        Extremity/Trunk Assessment   Upper Extremity Assessment: Generalized weakness           Lower Extremity Assessment: Generalized weakness      Cervical / Trunk Assessment: Normal  Communication      Cognition Arousal/Alertness: Awake/alert Behavior During Therapy: WFL for tasks assessed/performed Overall Cognitive Status: Within Functional Limits for tasks assessed                      General Comments      Exercises  Encouraged pt to perform APs, quad sets and hip abd/add exercises while sitting, in bed      Assessment/Plan    PT Assessment Patient needs continued PT services  PT Diagnosis Difficulty walking;Generalized weakness   PT Problem List Decreased strength;Decreased balance;Decreased mobility;Decreased activity tolerance;Decreased knowledge of use of DME  PT Treatment Interventions DME instruction;Gait training;Functional mobility training;Therapeutic activities;Therapeutic exercise;Patient/family education;Balance training   PT Goals (Current goals can  be found in the Care Plan section) Acute Rehab PT Goals Patient Stated Goal: return home. get stronger PT Goal Formulation: With patient/family Time For Goal Achievement: 04/23/13 Potential to Achieve Goals: Good    Frequency Min 3X/week   Barriers to discharge        Co-evaluation               End of Session Equipment Utilized During Treatment: Gait belt Activity Tolerance: Patient tolerated treatment well Patient left: in chair;with call bell/phone within reach;with family/visitor present           Time: 9518-8416 PT Time  Calculation (min): 20 min   Charges:   PT Evaluation $Initial PT Evaluation Tier I: 1 Procedure PT Treatments $Gait Training: 8-22 mins   PT G Codes:          Weston Anna, MPT Pager: [phone]

## 2013-04-09 NOTE — Progress Notes (Signed)
CRITICAL VALUE ALERT  Critical value received:  K 2.9  Date of notification:  04/09/2013   Time of notification:  4:47 AM  Critical value read back:yes  Nurse who received alert:  Sharyon Medicus RN  MD notified (1st page):  C. Rama, MD  Time of first page:  4:49 AM  MD notified (2nd page):  Time of second page:   Responding MD:  Isa Rankin, MD  Time MD responded:  4:51 AM

## 2013-04-09 NOTE — Progress Notes (Signed)
Note: This document was prepared with digital dictation and possible smart phrase technology. Any transcriptional errors that result from this process are unintentional.   Anna Farrell HBZ:[id number] DOB: [birth year] DOA: 04/06/2013 PCP: [name], MD  Brief narrative: 80 y/o ?, known h/o of Tachy/brady syndrome s/p [employer] PPM 2008, Chronic afib on coumadin, elective cholecsytectomy 09/17/11, prior L1 vertebroplasty, Mod persistent asthma from passive smoking h/o admitted  04/06/13 with severe abd pain   Initially on admission Patient was tachycardic to 120s, had temperature of 101F and mildly tachypneic. She had abdominal tenderness. Blood work done showed marked transaminitis with AST of 1238 and ALT of 607, alkaline phosphatase of 226, total bilirubin of 1.6. She had mild leukocytosis. INR was 2.4. Her lipase was 202 and lactic acid of 2.79. UA was unremarkable. Blood cultures ordered from the ED and patient given one dose of IV Zosyn.  A CT scan of the abdomen and pelvis done in the ED showed mild intra-and extrahepatic biliary dilatation. Also sort of mild colonic wall thickening possibly reflecting mild colitis  ultimately GI saw the patient and determined that she might benefit from ERCP/EUS as below.  Subsequently her LFT's trended downwards Was noted to be inappropriately pacing and was interrogated this in juice representative and made to sense unipolar from bipolar with a mode of VVI 60   Past medical history-As per Problem list Chart reviewed as below-   Consultants:  GI  Procedures:  EUS 04/08/13 Endoscopic findings:  1. Non-obstructing Schatzki's ring above 3cm hiatal hernia  2. Otherwise normal examination, views limited with radial  echoendoscope.   EUS findings:  1. CBD was slightly dilated (6.22mm) and there was a hyperechoic,  non-shadowing 1-31mm foci within the bile duct laying along mid bile  duct wall. This is probably a small stone or debris    (microlithiasis) in the bile duct.  2. Main pancreatic duct was normal, non-dilated  3. Pancreatic parenchyma was normal.  ERCP 04/08/13 ENDOSCOPIC IMPRESSION:1. Normal ampulla 2 no pancreatic duct wire  advancements or injections throughout the procedure 3. Dilated CBD  status post sphincterotomy and multiple adjustableballoon  pull-throughs with a few flecks of stone debris only and no  residual stone on occlusion cholangiogram at the end of the  procedure   Antibiotics:  Flagyl 3/31-4/1 Zosyn 3/31 ???04/09/13    Subjective  Less pain in abdomen tol clear liquids fairly  However a little nasueous Ambualtory to some extent Neck pains a little No  CP     Objective    Interim History: none  Telemetry: Afib, rates 100-120   Objective: Filed Vitals:   04/08/13 1249 04/08/13 2133 04/09/13 0407 04/09/13 0626  BP: 148/94 132/95 138/65 135/84  Pulse: 107 82 98 102  Temp: 99.9 F (37.7 C) 98.9 F (37.2 C) 98 F (36.7 C)   TempSrc: Oral Oral Oral   Resp: 18 18 18    Height:      Weight:      SpO2: 94% 93% 93%     Intake/Output Summary (Last 24 hours) at 04/09/13 0944 Last data filed at 04/09/13 0700  Gross per 24 hour  Intake 1530.83 ml  Output    200 ml  Net 1330.83 ml    Exam:  General: eomi, ncat,  Cardiovascular: irreg irreg, M noted LLSE grade 4/6 Respiratory: clear,  Abdomen: soft, nondistended Skin no LE edema Neuro intact  Data Reviewed: Basic Metabolic Panel:  Recent Labs Lab 04/06/13 0400 04/07/13 0343 04/08/13 0320 04/09/13 0405  04/09/13 0413  NA 141 143 141 138  --   K 3.7 3.3* 3.3* 2.9*  --   CL 101 107 104 100  --   CO2 26 25 28 28   --   GLUCOSE 152* 111* 105* 123*  --   BUN 13 14 10 9   --   CREATININE 0.51 0.74 0.56 0.55  --   CALCIUM 9.6 8.5 8.6 8.5  --   MG  --   --   --   --  1.9   Liver Function Tests:  Recent Labs Lab 04/06/13 0400 04/07/13 0343 04/08/13 0320 04/09/13 0405  AST 1238* 334* 196* 85*  ALT 607* 378*  242* 164*  ALKPHOS 326* 206* 192* 169*  BILITOT 1.6* 3.5* 4.2* 1.5*  PROT 7.1 5.4* 5.5* 5.5*  ALBUMIN 3.5 2.7* 2.6* 2.4*    Recent Labs Lab 04/06/13 0400 04/07/13 0343  LIPASE 202* 59   No results found for this basename: AMMONIA,  in the last 168 hours CBC:  Recent Labs Lab 04/06/13 0400 04/07/13 0343 04/08/13 0320 04/09/13 0405  WBC 10.7* 11.8* 9.5 7.5  NEUTROABS 9.8* 10.3*  --  5.7  HGB 14.9 11.8* 11.4* 11.0*  HCT 44.4 37.2 35.3* 34.1*  MCV 93.9 95.9 95.7 94.7  PLT 231 165 154 151   Cardiac Enzymes: No results found for this basename: CKTOTAL, CKMB, CKMBINDEX, TROPONINI,  in the last 168 hours BNP: No components found with this basename: POCBNP,  CBG: No results found for this basename: GLUCAP,  in the last 168 hours  Recent Results (from the past 240 hour(s))  CULTURE, BLOOD (ROUTINE X 2)     Status: None   Collection Time    04/06/13  6:56 AM      Result Value Ref Range Status   Specimen Description BLOOD LEFT HAND   Final   Special Requests BOTTLES DRAWN AEROBIC AND ANAEROBIC 3CC   Final   Culture  Setup Time     Final   Value: 04/06/2013 10:11     Performed at [facility]   Culture     Final   Value:        BLOOD CULTURE RECEIVED NO GROWTH TO DATE CULTURE WILL BE HELD FOR 5 DAYS BEFORE ISSUING A FINAL NEGATIVE REPORT     Performed at [facility]   Report Status PENDING   Incomplete  CULTURE, BLOOD (ROUTINE X 2)     Status: None   Collection Time    04/06/13  7:09 AM      Result Value Ref Range Status   Specimen Description BLOOD RIGHT HAND   Final   Special Requests BOTTLES DRAWN AEROBIC AND ANAEROBIC 3CC   Final   Culture  Setup Time     Final   Value: 04/06/2013 10:11     Performed at [facility]   Culture     Final   Value:        BLOOD CULTURE RECEIVED NO GROWTH TO DATE CULTURE WILL BE HELD FOR 5 DAYS BEFORE ISSUING A FINAL NEGATIVE REPORT     Performed at [facility]   Report Status PENDING   Incomplete    MRSA PCR SCREENING     Status: None   Collection Time    04/06/13  8:03 AM      Result Value Ref Range Status   MRSA by PCR NEGATIVE  NEGATIVE Final   Comment:  The GeneXpert MRSA Assay (FDA     approved for NASAL specimens     only), is one component of a     comprehensive MRSA colonization     surveillance program. It is not     intended to diagnose MRSA     infection nor to guide or     monitor treatment for     MRSA infections.     Studies:              All Imaging reviewed and is as per above notation   Scheduled Meds: . amLODipine  5 mg Oral Daily  . latanoprost  1 drop Both Eyes QHS  . lidocaine  1 patch Transdermal Q24H  . metoprolol succinate  50 mg Oral Daily  . piperacillin-tazobactam (ZOSYN)  IV  3.375 g Intravenous 3 times per day  . sodium chloride  3 mL Intravenous Q12H  . warfarin  5 mg Oral ONCE-1800  . Warfarin - Pharmacist Dosing Inpatient   Does not apply q1800   Continuous Infusions:     Assessment/Plan:  SIRS with Acute cholangitis  Patient presented with fever, tachycardia and tachypnea he can criteria for SIRS. Thought to be either cholangitis or choledocholithiasis  Patient had laparoscopic cholecystectomy in 2013.  -Empiric antibiotics with IV Zosyn discontinued 4/3.  Blood culture 3/31 beg so far -Clear liquids. Graduated to a regular diet Supportive care with when necessary tramadol q4 prn and Zofran for nausea. [Nausea with opiates] -Monitor LFTs closely-trending downwards currently now -Appreciate GI consult. And input.  ERCP and EUS non suggestive of blockage/stone  Acute pancreatitis  Secondary to choledocholithiasis. Monitor LFTs. Lipase has normalized and resolved.  Grad diet as tolerated Acute colitis  CT scan of the abdomen suggestive of mild acute colitis. Patient on empiric Zosyn .  HYPERTENSION  Re-started PO metoprolol 12.5 bid, ???increased to xl 50 mg daily.  Monitor on tele-start Cardizem if HR above 120  sustained.   Continue Amlodipine 5 daily [might adjust for need to increase Metoprolol in am] Chronic atrial fibrillation  Patient in A. fib upon presentation.  INR therapeutic. Hold Coumadin.  Pacemaker inappropriately firing this admission, interrogated from [place] represent a change from bipolar 2 unipolar mode, PVR 60 History of pacemaker  c Tachy-brady syndrome Stable currently Impaired glucose tolerance Monitor  Code Status: FULL No family at bedside currently Disposition Plan: tx to tele  Verneita Griffes, MD  [facility] Pager [phone] 04/09/2013, 9:44 AM    LOS: 3 days

## 2013-04-09 NOTE — Progress Notes (Signed)
Patient ID: Anna Farrell, female   DOB: 1927/12/23, 80 y.o.   MRN: [id number] [facility] Gastroenterology Progress Note  Anna Farrell 80 y.o. 06-28-1927   Subjective: Lying in bed awake. Tolerating solid foods. Feels achiness in abdomen. Denies N/V. Daughter at bedside.  Objective: Vital signs in last 24 hours: Filed Vitals:   04/09/13 1439  BP: 134/82  Pulse: 97  Temp: 98.2 F (36.8 C)  Resp: 19    Physical Exam: Gen: alert, no acute distress Abd: minimal epigastric tenderness with guarding, otherwise nontender, soft, nondistended, +BS  Lab Results:  Recent Labs  04/08/13 0320 04/09/13 0405 04/09/13 0413  NA 141 138  --   K 3.3* 2.9*  --   CL 104 100  --   CO2 28 28  --   GLUCOSE 105* 123*  --   BUN 10 9  --   CREATININE 0.56 0.55  --   CALCIUM 8.6 8.5  --   MG  --   --  1.9    Recent Labs  04/08/13 0320 04/09/13 0405  AST 196* 85*  ALT 242* 164*  ALKPHOS 192* 169*  BILITOT 4.2* 1.5*  PROT 5.5* 5.5*  ALBUMIN 2.6* 2.4*    Recent Labs  04/07/13 0343 04/08/13 0320 04/09/13 0405  WBC 11.8* 9.5 7.5  NEUTROABS 10.3*  --  5.7  HGB 11.8* 11.4* 11.0*  HCT 37.2 35.3* 34.1*  MCV 95.9 95.7 94.7  PLT 165 154 151    Recent Labs  04/08/13 1403 04/09/13 0405  LABPROT 17.1* 19.0*  INR 1.43 1.64*      Assessment/Plan: 80 yo resolved biliary pancreatitis s/p ERCP with sphincterotomy (04/07/13) doing well post-procedure. LFTs are normalizing. Tolerating solid food. If continues to do well then ok to d/c tomorrow from GI standpoint with LFTs rechecked as an outpt during middle of next week. Will leave message for Dr. Amedeo Plenty' nurse to call her Monday to arrange lab check for next week. Will sign off. Call if questions.   [place] C. 04/09/2013, 4:19 PM

## 2013-04-10 DIAGNOSIS — R109 Unspecified abdominal pain: Secondary | ICD-10-CM | POA: Diagnosis not present

## 2013-04-10 LAB — COMPREHENSIVE METABOLIC PANEL
ALK PHOS: 157 U/L — AB (ref 39–117)
ALT: 121 U/L — ABNORMAL HIGH (ref 0–35)
AST: 40 U/L — ABNORMAL HIGH (ref 0–37)
Albumin: 2.5 g/dL — ABNORMAL LOW (ref 3.5–5.2)
BUN: 5 mg/dL — ABNORMAL LOW (ref 6–23)
CO2: 31 meq/L (ref 19–32)
CREATININE: 0.47 mg/dL — AB (ref 0.50–1.10)
Calcium: 8.9 mg/dL (ref 8.4–10.5)
Chloride: 102 mEq/L (ref 96–112)
GFR calc Af Amer: >90 mL/min (ref >90)
GFR, EST NON AFRICAN AMERICAN: 89 mL/min — AB (ref >90)
Glucose, Bld: 107 mg/dL — ABNORMAL HIGH (ref 70–99)
POTASSIUM: 3 meq/L — AB (ref 3.7–5.3)
Sodium: 142 mEq/L (ref 137–147)
Total Bilirubin: 1 mg/dL (ref 0.3–1.2)
Total Protein: 5.7 g/dL — ABNORMAL LOW (ref 6.0–8.3)

## 2013-04-10 LAB — PROTIME-INR
INR: 2.44 — AB (ref 0.00–1.49)
Prothrombin Time: 25.7 seconds — ABNORMAL HIGH (ref 11.6–15.2)

## 2013-04-10 MED ORDER — POTASSIUM CHLORIDE CRYS ER 20 MEQ PO TBCR: 40.0000 meq | EXTENDED_RELEASE_TABLET | Freq: Every day | ORAL | Status: DC

## 2013-04-10 MED ORDER — POTASSIUM CHLORIDE CRYS ER 20 MEQ PO TBCR
40.0000 meq | EXTENDED_RELEASE_TABLET | Freq: Every day | ORAL | Status: DC
Start: 2013-04-10 — End: 2013-04-10
  Administered 2013-04-10: 40 meq via ORAL
  Filled 2013-04-10: qty 2

## 2013-04-10 MED ORDER — METOPROLOL SUCCINATE ER 50 MG PO TB24: 50.0000 mg | ORAL_TABLET | Freq: Every day | ORAL | Status: DC

## 2013-04-10 MED ORDER — DILTIAZEM HCL 25 MG/5ML IV SOLN
10.0000 mg | Freq: Once | INTRAVENOUS | Status: DC
  Filled 2013-04-10: qty 5

## 2013-04-10 NOTE — Progress Notes (Signed)
Pt discharged to home, instructions reviewed with daughter and pt. They verbalized understanding

## 2013-04-10 NOTE — Discharge Summary (Signed)
Physician Discharge Summary  Anna Farrell:[id number] DOB: [birth year] DOA: 04/06/2013  PCP: [name], MD  Admit date: 04/06/2013 Discharge date: 04/10/2013  Time spent: 30 minutes  Recommendations for Outpatient Follow-up:  1. Needs Bmet/ lipase and cbc 1 week c close f/u C GI Dr. Amedeo Plenty Consider OP Cardiology follow up-Metoprolol ? to 50 XL this admit-Pacer interrogated as well No PT needs identified this admission-she can continue use of her RW at home.   Discharge Diagnoses:  Principal Problem:   Acute cholangitis Active Problems:   HYPERTENSION   Chronic atrial fibrillation   Abdominal pain   SIRS (systemic inflammatory response syndrome)   Common bile duct dilatation   Transaminitis   Cardiac pacemaker in situ   Pancreatitis, acute   Discharge Condition: Stable  Diet recommendation: Heart healthy low-salt  Filed Weights   04/06/13 0342 04/07/13 0400 04/08/13 0402  Weight: 63.3 kg (139 lb 8.8 oz) 64.6 kg (142 lb 6.7 oz) 65.7 kg (144 lb 13.5 oz)    History of present illness:  80 y/o ?, known h/o of Tachy/brady syndrome s/p [employer] PPM 2008, Chronic afib on coumadin, elective cholecsytectomy 09/17/11, prior L1 vertebroplasty, Mod persistent asthma from passive smoking h/o admitted 04/06/13 with severe abd pain  Initially on admission Patient was tachycardic to 120s, had temperature of 101F and mildly tachypneic. She had abdominal tenderness. Blood work done showed marked transaminitis with AST of 1238 and ALT of 607, alkaline phosphatase of 226, total bilirubin of 1.6. She had mild leukocytosis. INR was 2.4. Her lipase was 202 and lactic acid of 2.79. UA was unremarkable. Blood cultures ordered from the ED and patient given one dose of IV Zosyn.  A CT scan of the abdomen and pelvis done in the ED showed mild intra-and extrahepatic biliary dilatation. Also sort of mild colonic wall thickening possibly reflecting mild colitis  ultimately GI saw the patient and  determined that she might benefit from ERCP/EUS as below. Subsequently her LFT's trended downwards  Was noted to be inappropriately pacing and was interrogated this in juice representative and made to sense unipolar from bipolar with a mode of VVI [place] Hospital Course:  SIRS with Acute cholangitis  Patient presented with fever, tachycardia and tachypnea he can criteria for SIRS. Thought to be either cholangitis or choledocholithiasis complicated pancreatitis Patient had laparoscopic cholecystectomy in 2013.  -Empiric antibiotics with IV Zosyn discontinued 4/3. Blood culture 3/31 beg so far  -Clear liquids. Graduated to a regular diet which she was able to tolerate  -Supportive care with when necessary tramadol q4 prn and Zofran for nausea. [Nausea with opiates]  -Monitor LFTs closely-trending downwards currently now-this will need to be repeated as an outpatient but we can forward to Dr. Doristine Locks office - ERCP and EUS on this admission non suggestive of blockage/stone  Acute pancreatitis  Secondary to potential resolved choledocholithiasis.  Monitor LFTs. Lipase has normalized and resolved.  Acute colitis  CT scan of the abdomen suggestive of mild acute colitis. Patient was on empiric Zosyn which was discontinued 4/3.  HYPERTENSION  Re-started PO metoprolol 12.5 bid, ???increased to xl 50 mg daily.  Continue Amlodipine 5 daily [might adjust for need to increase Metoprolol in am]  Chronic atrial fibrillation  Patient in A. fib upon presentation. INR therapeutic. Hold Coumadin.  Pacemaker inappropriately firing this admission, interrogated from [place] represent a change from bipolar 2 unipolar mode, PVR 60  Will continue metoprolol 50 XL Will ambulate patient to ensure that her is  not above [birth year]28[birth year]1928 and this will need to be followed closely as an outpatient History of pacemaker c Tachy-brady syndrome  Stable currently  Impaired glucose tolerance  Monitor At this stage no role for  hypoglycemic agents   Consultants:  GI Procedures:  EUS 04/08/13 Endoscopic findings:  1. Non-obstructing Schatzki's ring above 3cm hiatal hernia  2. Otherwise normal examination, views limited with radial  echoendoscope.  EUS findings:  1. CBD was slightly dilated (6.6mm) and there was a hyperechoic,  non-shadowing 1-49mm foci within the bile duct laying along mid bile  duct wall. This is probably a small stone or debris  (microlithiasis) in the bile duct.  2. Main pancreatic duct was normal, non-dilated  3. Pancreatic parenchyma was normal.  ERCP 04/08/13 ENDOSCOPIC IMPRESSION:1. Normal ampulla 2 no pancreatic duct wire  advancements or injections throughout the procedure 3. Dilated CBD  status post sphincterotomy and multiple adjustableballoon  pull-throughs with a few flecks of stone debris only and no  residual stone on occlusion cholangiogram at the end of the  procedure  Antibiotics:  Flagyl 3/31-4/1 Zosyn 3/31 ???04/09/13    Discharge Exam: Filed Vitals:   04/10/13 0650  BP: 160/102  Pulse:   Temp:   Resp:    Alert pleasant tolerated diet well  General: EOMI, NCAT, no murmur rub or gallop Cardiovascular: S1-S2 tachycardic the 100s atrial fibrillation noted Respiratory: Clinically clear  Discharge Instructions You were cared for by a hospitalist during your hospital stay. If you have any questions about your discharge medications or the care you received while you were in the hospital after you are discharged, you can call the unit and asked to speak with the hospitalist on call if the hospitalist that took care of you is not available. Once you are discharged, your primary care physician will handle any further medical issues. Please note that NO REFILLS for any discharge medications will be authorized once you are discharged, as it is imperative that you return to your primary care physician (or establish a relationship with a primary care physician if you do not have  one) for your aftercare needs so that they can reassess your need for medications and monitor your lab values.   Future Appointments Provider Department Dept Phone   04/15/2013 2:45 PM [facility] [place] Office [phone]       Medication List    STOP taking these medications       TYLENOL 500 MG tablet  Generic drug:  acetaminophen      TAKE these medications       amLODipine 5 MG tablet  Commonly known as:  NORVASC  Take 1 tablet (5 mg total) by mouth daily.     lidocaine 5 %  Commonly known as:  LIDODERM  Place 1 patch onto the skin daily.     metoprolol succinate 50 MG 24 hr tablet  Commonly known as:  TOPROL-XL  Take 1 tablet (50 mg total) by mouth daily. Take with or immediately following a meal.     potassium chloride SA 20 MEQ tablet  Commonly known as:  K-DUR,KLOR-CON  Take 2 tablets (40 mEq total) by mouth daily.     pravastatin 40 MG tablet  Commonly known as:  PRAVACHOL  Take 40 mg by mouth daily. Once daily     traMADol 50 MG tablet  Commonly known as:  ULTRAM  Take 50 mg by mouth every 8 (eight) hours as needed. As needed for pain  traMADol 200 MG 24 hr tablet  Commonly known as:  ULTRAM-ER  Take 200 mg by mouth daily. Takes 200mg  in morning then 50mg  through day PRN     TRAVATAN Z 0.004 % Soln ophthalmic solution  Generic drug:  Travoprost (BAK Free)  Place 1 drop into both eyes daily.     Vitamin D3 1000 UNITS Caps  Take 2 capsules by mouth daily.     warfarin 3 MG tablet  Commonly known as:  COUMADIN  Take 3 mg by mouth daily. Takes one tablet every day except Thursday she takes one and one half tablet       Allergies  Allergen Reactions  . Codeine Nausea And Vomiting    Passed out      The results of significant diagnostics from this hospitalization (including imaging, microbiology, ancillary and laboratory) are listed below for reference.    Significant Diagnostic Studies: Ct Abdomen Pelvis W  Contrast  04/06/2013   CLINICAL DATA:  Nausea, vomiting, history of cholecystectomy, hysterectomy, appendectomy, vesiculovaginal fistula closure.  EXAM: CT ABDOMEN AND PELVIS WITH CONTRAST  TECHNIQUE: Multidetector CT imaging of the abdomen and pelvis was performed using the standard protocol following bolus administration of intravenous contrast.  CONTRAST:  18mL OMNIPAQUE IOHEXOL 300 MG/ML  SOLN  COMPARISON:  CT ABD/PELVIS W CM dated 06/03/2011  FINDINGS: Included view of the lung bases are clear. The heart appears mildly enlarged, cardiac pacer wires in place, pericardium is unremarkable.  Status post cholecystectomy. Mild intrahepatic biliary dilatation, Common bile duct is 15 mm. No CT findings of choledocholithiasis. The liver is otherwise unremarkable.  Spleen, pancreas, adrenal glands are nonsuspicious.  Layering debris within the stomach which is moderately distended with contrast and air. Small and large bowel are normal in course and caliber. Mild multi segmental colonic wall thickening may be accentuated by under distention. Ptotic transverse colon with mild amount of retained large bowel stool. No intraperitoneal free fluid nor free air.  Kidneys are unremarkable, delayed imaging demonstrates prompt symmetric excretion of contrast into the proximal urinary collecting system. Aortoiliac vessels are normal course and caliber with mild calcific atherosclerosis. Urinary bladder is well distended unremarkable. Status post hysterectomy.  Laparotomy scar. Patient is osteopenic and scoliotic. L1 burst fracture, status post cement augmentation.  IMPRESSION: Status post interval cholecystectomy. Mild intra and extrahepatic biliary dilatation, the degree of intrahepatic biliary dilatation is increased from prior CT. No CT findings of choledocholithiasis.  Mild colonic wall thickening which may be over estimated by under distention under can reflect mild colitis. No bowel obstruction.   Electronically Signed   By:  Elon Alas   On: 04/06/2013 06:03   Dg Chest Port 1 View  04/06/2013   CLINICAL DATA:  Abdomen pain  EXAM: PORTABLE CHEST - 1 VIEW  COMPARISON:  09/12/2011  FINDINGS: Cardiac enlargement without heart failure. Dual lead pacemaker unchanged. Negative for pneumonia or effusion.  IMPRESSION: No active disease.   Electronically Signed   By: Franchot Gallo M.D.   On: 04/06/2013 07:06   Dg Ercp Biliary & Pancreatic Ducts  04/07/2013   CLINICAL DATA:  Common bile duct stone  EXAM: ERCP  TECHNIQUE: Multiple spot images obtained with the fluoroscopic device and submitted for interpretation post-procedure.  COMPARISON:  None.  FINDINGS: The common bile duct has been cannulated with extension into the common hepatic duct. There is diffuse common bile and hepatic duct dilation. There is insufflation of a balloon within the common bile duct. The visualized portions of the  duct demonstrate no filling defects status post balloon dilation. The distal aspect of the common bile duct is obscured by the endoscope. No appreciable drainage into the small bowel. There is no evidence of contrast extravasation.  IMPRESSION: Residual common bile duct dilation without evidence of filling defects within the visualized portions.  These images were submitted for radiologic interpretation only. Please see the procedural report for the amount of contrast and the fluoroscopy time utilized.   Electronically Signed   By: Margaree Mackintosh M.D.   On: 04/07/2013 14:47    Microbiology: Recent Results (from the past 240 hour(s))  CULTURE, BLOOD (ROUTINE X 2)     Status: None   Collection Time    04/06/13  6:56 AM      Result Value Ref Range Status   Specimen Description BLOOD LEFT HAND   Final   Special Requests BOTTLES DRAWN AEROBIC AND ANAEROBIC 3CC   Final   Culture  Setup Time     Final   Value: 04/06/2013 10:11     Performed at [facility]   Culture     Final   Value:        BLOOD CULTURE RECEIVED NO GROWTH TO DATE  CULTURE WILL BE HELD FOR 5 DAYS BEFORE ISSUING A FINAL NEGATIVE REPORT     Performed at [facility]   Report Status PENDING   Incomplete  CULTURE, BLOOD (ROUTINE X 2)     Status: None   Collection Time    04/06/13  7:09 AM      Result Value Ref Range Status   Specimen Description BLOOD RIGHT HAND   Final   Special Requests BOTTLES DRAWN AEROBIC AND ANAEROBIC 3CC   Final   Culture  Setup Time     Final   Value: 04/06/2013 10:11     Performed at [facility]   Culture     Final   Value:        BLOOD CULTURE RECEIVED NO GROWTH TO DATE CULTURE WILL BE HELD FOR 5 DAYS BEFORE ISSUING A FINAL NEGATIVE REPORT     Performed at [facility]   Report Status PENDING   Incomplete  MRSA PCR SCREENING     Status: None   Collection Time    04/06/13  8:03 AM      Result Value Ref Range Status   MRSA by PCR NEGATIVE  NEGATIVE Final   Comment:            The GeneXpert MRSA Assay (FDA     approved for NASAL specimens     only), is one component of a     comprehensive MRSA colonization     surveillance program. It is not     intended to diagnose MRSA     infection nor to guide or     monitor treatment for     MRSA infections.     Labs: Basic Metabolic Panel:  Recent Labs Lab 04/06/13 0400 04/07/13 0343 04/08/13 0320 04/09/13 0405 04/09/13 0413 04/10/13 0414  NA 141 143 141 138  --  142  K 3.7 3.3* 3.3* 2.9*  --  3.0*  CL 101 107 104 100  --  102  CO2 26 25 28 28   --  31  GLUCOSE 152* 111* 105* 123*  --  107*  BUN 13 14 10 9   --  5*  CREATININE 0.51 0.74 0.56 0.55  --  0.47*  CALCIUM 9.6 8.5 8.6 8.5  --  8.9  MG  --   --   --   --  1.9  --    Liver Function Tests:  Recent Labs Lab 04/06/13 0400 04/07/13 0343 04/08/13 0320 04/09/13 0405 04/10/13 0414  AST 1238* 334* 196* 85* 40*  ALT 607* 378* 242* 164* 121*  ALKPHOS 326* 206* 192* 169* 157*  BILITOT 1.6* 3.5* 4.2* 1.5* 1.0  PROT 7.1 5.4* 5.5* 5.5* 5.7*  ALBUMIN 3.5 2.7* 2.6* 2.4* 2.5*     Recent Labs Lab 04/06/13 0400 04/07/13 0343  LIPASE 202* 59   No results found for this basename: AMMONIA,  in the last 168 hours CBC:  Recent Labs Lab 04/06/13 0400 04/07/13 0343 04/08/13 0320 04/09/13 0405  WBC 10.7* 11.8* 9.5 7.5  NEUTROABS 9.8* 10.3*  --  5.7  HGB 14.9 11.8* 11.4* 11.0*  HCT 44.4 37.2 35.3* 34.1*  MCV 93.9 95.9 95.7 94.7  PLT 231 165 154 151   Cardiac Enzymes: No results found for this basename: CKTOTAL, CKMB, CKMBINDEX, TROPONINI,  in the last 168 hours BNP: BNP (last 3 results) No results found for this basename: PROBNP,  in the last 8760 hours CBG: No results found for this basename: GLUCAP,  in the last 168 hours     Signed:  Nita Sells  [facility] 04/10/2013, 9:06 AM

## 2013-04-12 LAB — CULTURE, BLOOD (ROUTINE X 2)
CULTURE: NO GROWTH
Culture: NO GROWTH

## 2013-04-14 DIAGNOSIS — K859 Acute pancreatitis without necrosis or infection, unspecified: Secondary | ICD-10-CM | POA: Diagnosis not present

## 2013-04-15 ENCOUNTER — Ambulatory Visit (INDEPENDENT_AMBULATORY_CARE_PROVIDER_SITE_OTHER): Payer: Medicare Other

## 2013-04-15 DIAGNOSIS — I482 Chronic atrial fibrillation, unspecified: Secondary | ICD-10-CM

## 2013-04-15 DIAGNOSIS — I4891 Unspecified atrial fibrillation: Secondary | ICD-10-CM | POA: Diagnosis not present

## 2013-04-15 DIAGNOSIS — Z5181 Encounter for therapeutic drug level monitoring: Secondary | ICD-10-CM | POA: Diagnosis not present

## 2013-04-15 LAB — POCT INR: INR: 2.4

## 2013-04-16 ENCOUNTER — Ambulatory Visit (INDEPENDENT_AMBULATORY_CARE_PROVIDER_SITE_OTHER): Payer: Medicare Other | Admitting: Internal Medicine

## 2013-04-16 ENCOUNTER — Encounter: Payer: Self-pay | Admitting: Internal Medicine

## 2013-04-16 ENCOUNTER — Other Ambulatory Visit: Payer: Self-pay | Admitting: Internal Medicine

## 2013-04-16 VITALS — BP 140/92 | HR 85 | Ht 62.0 in | Wt 136.0 lb

## 2013-04-16 DIAGNOSIS — H1045 Other chronic allergic conjunctivitis: Secondary | ICD-10-CM | POA: Diagnosis not present

## 2013-04-16 DIAGNOSIS — Z950 Presence of cardiac pacemaker: Secondary | ICD-10-CM

## 2013-04-16 DIAGNOSIS — R079 Chest pain, unspecified: Secondary | ICD-10-CM

## 2013-04-16 DIAGNOSIS — I495 Sick sinus syndrome: Secondary | ICD-10-CM | POA: Diagnosis not present

## 2013-04-16 DIAGNOSIS — I4891 Unspecified atrial fibrillation: Secondary | ICD-10-CM

## 2013-04-16 DIAGNOSIS — I10 Essential (primary) hypertension: Secondary | ICD-10-CM | POA: Diagnosis not present

## 2013-04-16 DIAGNOSIS — I482 Chronic atrial fibrillation, unspecified: Secondary | ICD-10-CM

## 2013-04-16 LAB — MDC_IDC_ENUM_SESS_TYPE_INCLINIC: Implantable Pulse Generator Serial Number: 1908400

## 2013-04-16 MED ORDER — NITROGLYCERIN 0.4 MG SL SUBL: 0.4000 mg | SUBLINGUAL_TABLET | SUBLINGUAL | Status: DC | PRN

## 2013-04-16 NOTE — Patient Instructions (Signed)
Your physician wants you to follow-up in: 12 months with Dr Vallery Ridge will receive a reminder letter in the mail two months in advance. If you don't receive a letter, please call our office to schedule the follow-up appointment.  Your physician recommends that you schedule a follow-up appointment in: 3-4 weeks with Dr Radford Pax  Your physician has requested that you have a lexiscan myoview. For further information please visit [email or web address]. Please follow instruction sheet, as given.  Your physician has recommended that you have a sleep study. This test records several body functions during sleep, including: brain activity, eye movement, oxygen and carbon dioxide blood levels, heart rate and rhythm, breathing rate and rhythm, the flow of air through your mouth and nose, snoring, body muscle movements, and chest and belly movement.  Your physician has recommended you make the following change in your medication:  1) Ntg 0.4mg  as directed

## 2013-04-18 DIAGNOSIS — R079 Chest pain, unspecified: Secondary | ICD-10-CM | POA: Insufficient documentation

## 2013-04-18 NOTE — Progress Notes (Signed)
Anna Neer, MD: Primary Cardiologist:  Dr Gwenith Daily Anna Farrell is a 80 y.o. female with a h/o bradycardia/tachycardia syndrome sp PPM (SJM) by Dr Leonia Reeves who presents today for cardiology follow-up. She was recently discharged after a hospitalization for acute cholangitis/ pancreatitis.  She says that during her hospital stay, she was observed to have "apnea" while sleeping with desaturation. She reports symptoms of chest discomfort.  She describes a "hollowness" in her chest.  She has occasional SOB with this.  She was discharged with plans to follow-up with her cardiologist.  She was placed on my schedule rather than Dr Radford Pax for unclear reasons.  Today, she  denies symptoms of palpitations, exertional chest pain,   lower extremity edema,  recent syncope, or neurologic sequela.  The patientis tolerating medications without difficulties and is otherwise without complaint today.   Past Medical History  Diagnosis Date  . Vasovagal syncope   . Hypertension   . Hyperlipidemia   . Orthostatic hypotension   . Osteoporosis   . Pacemaker 10/11/06    implanted by Dr Leonia Reeves for pauses > 4 seconds with afib  . Iron deficiency anemia   . Shortness of breath   . Arthritis   . Colitis 5/13  . Chronic low back pain 2008    crushed vertebrae  . Tachycardia-bradycardia syndrome   . Permanent atrial fibrillation   . Asymptomatic PVCs   . History of colonic polyps   . Pure hypercholesterolemia   . Extrinsic asthma, unspecified     LOV Dr Joya Gaskins 7/12  . Long term (current) use of anticoagulants   . Cholelithiasis   . Generalized osteoarthrosis, unspecified site    Past Surgical History  Procedure Laterality Date  . Vesicovaginal fistula closure w/ tah  1981  . Pacemaker insertion  10/11/2006    SJM Zephyr XL DR implanted by Dr Leonia Reeves for afib with pause > 4 seconds  . Colonoscopy    . Appendectomy    . Abdominal hysterectomy    . Cholecystectomy  09/17/2011    Procedure:  LAPAROSCOPIC CHOLECYSTECTOMY;  Surgeon: Rolm Bookbinder, MD;  Location: [facility];  Service: General;;  . Cardiac catheterization  2009    Normal coronary arteries  . Ercp N/A 04/07/2013    Procedure: ENDOSCOPIC RETROGRADE CHOLANGIOPANCREATOGRAPHY (ERCP);  Surgeon: Jeryl Columbia, MD;  Location: [facility];  Service: Endoscopy;  Laterality: N/A;  . Eus  04/07/2013    Procedure: UPPER ENDOSCOPIC ULTRASOUND (EUS) RADIAL;  Surgeon: Jeryl Columbia, MD;  Location: [facility];  Service: Endoscopy;;    History   Social History  . Marital Status: Widowed    Spouse Name: N/A    Number of Children: N/A  . Years of Education: N/A   Occupational History  . Waitress     [employer]   Social History Main Topics  . Smoking status: Never Smoker   . Smokeless tobacco: Never Used  . Alcohol Use: No  . Drug Use: No  . Sexual Activity: No   Other Topics Concern  . Not on file   Social History Narrative  . No narrative on file    Family History  Problem Relation Age of Onset  . Hypertension Mother     Allergies  Allergen Reactions  . Codeine Nausea And Vomiting    Passed out    Current Outpatient Prescriptions  Medication Sig Dispense Refill  . amLODipine (NORVASC) 5 MG tablet Take 5 mg by mouth daily.      . Cholecalciferol (  VITAMIN D3) 1000 UNITS CAPS Take 2 capsules by mouth daily.       Marland Kitchen lidocaine (LIDODERM) 5 % Place 1 patch onto the skin daily.       . metoprolol succinate (TOPROL-XL) 50 MG 24 hr tablet Take 1 tablet (50 mg total) by mouth daily. Take with or immediately following a meal.  30 tablet  0  . potassium chloride SA (K-DUR,KLOR-CON) 20 MEQ tablet Take 2 tablets (40 mEq total) by mouth daily.  30 tablet  0  . pravastatin (PRAVACHOL) 40 MG tablet Take 40 mg by mouth daily. Once daily      . traMADol (ULTRAM) 50 MG tablet Take 50 mg by mouth every 8 (eight) hours as needed. As needed for pain      . traMADol (ULTRAM-ER) 200 MG 24 hr tablet Take 200 mg by mouth daily. Takes  200mg  in morning then 50mg  through day PRN      . TRAVATAN Z 0.004 % SOLN ophthalmic solution Place 1 drop into both eyes daily.      Marland Kitchen warfarin (COUMADIN) 3 MG tablet Take 3 mg by mouth daily. Takes one tablet every day except Thursday she takes one and one half tablet      . nitroGLYCERIN (NITROSTAT) 0.4 MG SL tablet Place 1 tablet (0.4 mg total) under the tongue every 5 (five) minutes as needed for chest pain.  90 tablet  3   No current facility-administered medications for this visit.    ROS- all systems are reviewed and negative except as per HPI  Physical Exam: Filed Vitals:   04/16/13 1604  BP: 140/92  Pulse: 85  Height: 5\' 2"  (1.575 m)  Weight: 136 lb (61.689 kg)    GEN- The patient is elderly appearing, alert and oriented x 3 today.   Head- normocephalic, atraumatic Eyes-  Sclera clear, conjunctiva pink Ears- hearing intact Oropharynx- clear Neck- supple,  Lungs- Clear to ausculation bilaterally, normal work of breathing Chest- pacemaker pocket is well healed Heart- irregular rate and rhythm, no murmurs, rubs or gallops, PMI not laterally displaced GI- soft, NT, ND, + BS Extremities- no clubbing, cyanosis, or edema MS- age appropriate atrophy Neuro- strength and sensation are intact  Pacemaker interrogation- reviewed in detail today,  See PACEART report ekg today reveals afib, V rate 85, no ischemic changes  Assessment and Plan:  1. Tachycardia/ bradycardia syndrome Normal pacemaker function During her recent hospital stay, she had low R waves with undersensing and inappropriate pacing.  This was corrected with switching from bipolar to unipolar sensing.  No further changes are required at this time. No indication for lead revision, particularly given her advanced age.   2. Permanent afib Rate controlled chads2vasc score is at least 4.  Continue long term anticoagulation  3. HTN Stable No change required today  4. Chest discomfort- both typical and atypical  features.  She has multiple CAD risk factors. I will therefore order a lexiscan myoview to further evaluate for ischemia.  If low risk, medical therapy is advised  5. ? Apnea/ desaturation at night I will order a sleep study  Return to follow-up with Dr Radford Pax at the next available time to discuss results of stress test and sleep study  Return to the device clinic in 6 months Follow-up with Jerene Pitch in 1 year

## 2013-04-23 ENCOUNTER — Encounter: Payer: Self-pay | Admitting: Internal Medicine

## 2013-04-26 ENCOUNTER — Encounter: Payer: Self-pay | Admitting: Cardiology

## 2013-04-27 ENCOUNTER — Ambulatory Visit (INDEPENDENT_AMBULATORY_CARE_PROVIDER_SITE_OTHER): Payer: Medicare Other | Admitting: Cardiology

## 2013-04-27 ENCOUNTER — Encounter: Payer: Self-pay | Admitting: Cardiology

## 2013-04-27 ENCOUNTER — Ambulatory Visit (INDEPENDENT_AMBULATORY_CARE_PROVIDER_SITE_OTHER): Payer: Medicare Other | Admitting: Pharmacist

## 2013-04-27 VITALS — BP 120/90 | HR 87 | Ht 62.0 in | Wt 132.0 lb

## 2013-04-27 DIAGNOSIS — I482 Chronic atrial fibrillation, unspecified: Secondary | ICD-10-CM

## 2013-04-27 DIAGNOSIS — R5381 Other malaise: Secondary | ICD-10-CM

## 2013-04-27 DIAGNOSIS — R5383 Other fatigue: Secondary | ICD-10-CM | POA: Diagnosis not present

## 2013-04-27 DIAGNOSIS — R079 Chest pain, unspecified: Secondary | ICD-10-CM

## 2013-04-27 DIAGNOSIS — I4891 Unspecified atrial fibrillation: Secondary | ICD-10-CM

## 2013-04-27 DIAGNOSIS — I10 Essential (primary) hypertension: Secondary | ICD-10-CM

## 2013-04-27 DIAGNOSIS — Z950 Presence of cardiac pacemaker: Secondary | ICD-10-CM

## 2013-04-27 DIAGNOSIS — Z5181 Encounter for therapeutic drug level monitoring: Secondary | ICD-10-CM

## 2013-04-27 DIAGNOSIS — I951 Orthostatic hypotension: Secondary | ICD-10-CM

## 2013-04-27 DIAGNOSIS — I495 Sick sinus syndrome: Secondary | ICD-10-CM | POA: Diagnosis not present

## 2013-04-27 LAB — COMPREHENSIVE METABOLIC PANEL
ALT: 19 U/L (ref 0–35)
AST: 26 U/L (ref 0–37)
Albumin: 3.5 g/dL (ref 3.5–5.2)
Alkaline Phosphatase: 101 U/L (ref 39–117)
BUN: 11 mg/dL (ref 6–23)
CO2: 27 mEq/L (ref 19–32)
Calcium: 9.8 mg/dL (ref 8.4–10.5)
Chloride: 101 mEq/L (ref 96–112)
Creatinine, Ser: 0.9 mg/dL (ref 0.4–1.2)
GFR: 65.15 mL/min (ref >60.00)
Glucose, Bld: 130 mg/dL — ABNORMAL HIGH (ref 70–99)
Potassium: 3.7 mEq/L (ref 3.5–5.1)
Sodium: 138 mEq/L (ref 135–145)
Total Bilirubin: 0.8 mg/dL (ref 0.3–1.2)
Total Protein: 7.1 g/dL (ref 6.0–8.3)

## 2013-04-27 LAB — CBC
HCT: 46 % (ref 36.0–46.0)
HEMOGLOBIN: 15.1 g/dL — AB (ref 12.0–15.0)
MCHC: 32.7 g/dL (ref 30.0–36.0)
MCV: 92.9 fl (ref 78.0–100.0)
Platelets: 313 10*3/uL (ref 150.0–400.0)
RBC: 4.96 Mil/uL (ref 3.87–5.11)
RDW: 13.5 % (ref 11.5–14.6)
WBC: 7.9 10*3/uL (ref 4.5–10.5)

## 2013-04-27 LAB — TSH: TSH: 2.04 u[IU]/mL (ref 0.35–5.50)

## 2013-04-27 LAB — POCT INR: INR: 2.5

## 2013-04-27 NOTE — Progress Notes (Signed)
[name], [address], [place]  [address] Phone: [phone] Fax:  [phone]  Date:  04/27/2013   ID:  Anna Farrell, DOB 1927/02/20, MRN [id number]  PCP:  Anna Neer, MD  Cardiologist:  Anna Him, MD     History of Present Illness:Anna Farrell is a 80 y.o. female with a history of chronic atrial fibrillation, orthostatic hypotension, PPM and asymptomatic PVC's who presents today for followup. She is doing well. She denies any LE edema, palpitations. Since I saw her she was hospitalized for acute cholangitis with sepsis and was treated with IV antibiotics .  She was also diagnosed with acute pancreatitis secondary to choledocholithiasis and acute colitis as well.  Her pacer was noted to be firing inappropriately due to low R wave voltage with undersensing and inappropriate pacing and was changed from bipolar to unipolar.  She was also noted to have some apnea in the hospital while sleeping with O2 desats.  She was seen by Dr. Rayann Farrell post hospital and was complaining of some chest discomfort and a Lexiscan myoview was ordered.  A sleep study was also ordered.  She has not had the [employer] (scheduled for 4/28 or the sleep study yet).  She was supposed to followup after her stress test but was feeling bad and moved her appt up.  She says that since her hospitalization she continues to have the "empty" feeling in her chest and then she cannot catch her breath for an instance.  She has had a cough and is coughing up clear phlegm.  She also has DOE with minimal exertion.  She has not had any LE edema, palpitations or syncope.  She has had some episodes of dizziness with intermittent orthostasis.      Wt Readings from Last 3 Encounters:  04/16/13 136 lb (61.689 kg)  04/08/13 144 lb 13.5 oz (65.7 kg)  04/08/13 144 lb 13.5 oz (65.7 kg)     Past Medical History  Diagnosis Date  . Vasovagal syncope   . Hypertension   . Hyperlipidemia   . Orthostatic hypotension   .  Osteoporosis   . Pacemaker 10/11/06    implanted by Dr Anna Farrell for pauses > 4 seconds with afib  . Iron deficiency anemia   . Shortness of breath   . Arthritis   . Colitis 5/13  . Chronic low back pain 2008    crushed vertebrae  . Tachycardia-bradycardia syndrome   . Permanent atrial fibrillation   . Asymptomatic PVCs   . History of colonic polyps   . Pure hypercholesterolemia   . Extrinsic asthma, unspecified     LOV Dr Anna Farrell 7/12  . Long term (current) use of anticoagulants   . Cholelithiasis   . Generalized osteoarthrosis, unspecified site     Current Outpatient Prescriptions  Medication Sig Dispense Refill  . amLODipine (NORVASC) 5 MG tablet Take 5 mg by mouth daily.      . Cholecalciferol (VITAMIN D3) 1000 UNITS CAPS Take 2 capsules by mouth daily.       Marland Kitchen lidocaine (LIDODERM) 5 % Place 1 patch onto the skin daily.       . metoprolol succinate (TOPROL-XL) 50 MG 24 hr tablet Take 1 tablet (50 mg total) by mouth daily. Take with or immediately following a meal.  30 tablet  0  . nitroGLYCERIN (NITROSTAT) 0.4 MG SL tablet Place 1 tablet (0.4 mg total) under the tongue every 5 (five) minutes as needed for chest pain.  90 tablet  3  . potassium chloride SA (K-DUR,KLOR-CON) 20 MEQ tablet Take 2 tablets (40 mEq total) by mouth daily.  30 tablet  0  . pravastatin (PRAVACHOL) 40 MG tablet Take 40 mg by mouth daily. Once daily      . traMADol (ULTRAM) 50 MG tablet Take 50 mg by mouth every 8 (eight) hours as needed. As needed for pain      . traMADol (ULTRAM-ER) 200 MG 24 hr tablet Take 200 mg by mouth daily. Takes 200mg  in morning then 50mg  through day PRN      . TRAVATAN Z 0.004 % SOLN ophthalmic solution Place 1 drop into both eyes daily.      Marland Kitchen warfarin (COUMADIN) 3 MG tablet Take 3 mg by mouth daily. Takes one tablet every day except Thursday she takes one and one half tablet       No current facility-administered medications for this visit.    Allergies:    Allergies  Allergen  Reactions  . Codeine Nausea And Vomiting    Passed out    Social History:  The patient  reports that she has never smoked. She has never used smokeless tobacco. She reports that she does not drink alcohol or use illicit drugs.   Family History:  The patient's family history includes Hypertension in her mother.   ROS:  Please see the history of present illness.      All other systems reviewed and negative.   PHYSICAL EXAM: VS:  There were no vitals taken for this visit. Well nourished, well developed, in no acute distress HEENT: normal Neck: no JVD Cardiac:  normal S1, S2; RRR; no murmur Lungs:  clear to auscultation bilaterally, no wheezing, rhonchi or rales Abd: soft, nontender, no hepatomegaly Ext: no edema Skin: warm and dry Neuro:  CNs 2-12 intact, no focal abnormalities noted    ASSESSMENT AND PLAN:  1.  Chronic atrial fibrillation rate controld  - continue metoprolol/warfarin  2.  Orthostatic hypotension -now with some intermittent dizzy spells with intermittent orthostasis - I have recommended that she go back to wearing her TED hose stockings. 3.  HTN - borderline control - continue amlodipine/metoprolol  4.  Chest discomfort that is atypical and very difficult for her to explain - she has a [employer] pending in a few weeks - I am going to see if we can move up her stress test 5.  Witnessed apnea in the hospital during sleep - she has a sleep study pending. 6.  Fatigue which may be due to slow recovery from recent illness - check CMET/CBC and TSH  Followup with me in 3 months     Signed, Anna Him, MD 04/27/2013 11:12 AM

## 2013-04-27 NOTE — Patient Instructions (Signed)
Your physician recommends that you return for lab work today for cbc, cmet and tsh.  Restart using your ted hose.  Your sleep study has been moved up to 05/05/13 with arrival time between 7:30 and 8 pm.  Move myoview appt up to this week per Dr. Radford Pax.  Your physician recommends that you schedule a follow-up appointment in: 3 months with Dr. Radford Pax.

## 2013-04-28 ENCOUNTER — Ambulatory Visit (HOSPITAL_COMMUNITY)
Admission: RE | Admit: 2013-04-28 | Discharge: 2013-04-28 | Disposition: A | Discharge status: Home / Self Care | Payer: Medicare Other | Source: Ambulatory Visit | Attending: Cardiology | Admitting: Cardiology

## 2013-04-28 DIAGNOSIS — R079 Chest pain, unspecified: Secondary | ICD-10-CM | POA: Diagnosis not present

## 2013-04-28 MED ORDER — TECHNETIUM TC 99M SESTAMIBI GENERIC - CARDIOLITE
10.5000 | Freq: Once | INTRAVENOUS | Status: AC | PRN
  Administered 2013-04-28: 11 via INTRAVENOUS

## 2013-04-28 MED ORDER — AMINOPHYLLINE 25 MG/ML IV SOLN
75.0000 mg | Freq: Once | INTRAVENOUS | Status: AC
  Administered 2013-04-28: 75 mg via INTRAVENOUS

## 2013-04-28 MED ORDER — REGADENOSON 0.4 MG/5ML IV SOLN
0.4000 mg | Freq: Once | INTRAVENOUS | Status: AC
  Administered 2013-04-28: 0.4 mg via INTRAVENOUS

## 2013-04-28 MED ORDER — TECHNETIUM TC 99M SESTAMIBI GENERIC - CARDIOLITE
30.4000 | Freq: Once | INTRAVENOUS | Status: AC | PRN
  Administered 2013-04-28: 30 via INTRAVENOUS

## 2013-04-28 NOTE — Procedures (Addendum)
[facility] [address] [place] [address] [phone]  Cardiology Nuclear Med Study  CADANCE Anna Farrell is a 80 y.o. female     MRN : [id number]     DOB: January 22, 1927  Procedure Date: 04/28/2013  Nuclear Med Background Indication for Stress Test:  Abnormal EKG History:  Pacer, Chronic A-Fib, No prior nuclear MPI study Cardiac Risk Factors: Hypertension and Lipids  Symptoms:  Chest Pain, Dizziness, DOE and Fatigue   Nuclear Pre-Procedure Caffeine/Decaff Intake:  7:00pm NPO After: 5:00am   IV Site: R Antecubital  IV 0.9% NS with Angio Cath:  22g  Chest Size (in):  n/a IV Started by: Otho Perl, CNMT  Height: 5\' 2"  (1.575 m)  Cup Size: 36B  BMI:  Body mass index is 24.87 kg/(m^2). Weight:  136 lb (61.689 kg)   Tech Comments:  n/a    Nuclear Med Study 1 or 2 day study: 1 day  Stress Test Type:  [place] Provider:  Thompson Grayer, MD   Resting Radionuclide: Technetium 70m Sestamibi  Resting Radionuclide Dose: 10.5 mCi   Stress Radionuclide:  Technetium 74m Sestamibi  Stress Radionuclide Dose: 30.4 mCi           Stress Protocol Rest HR: 82 Stress HR: 68  Rest BP: 156/107 Stress BP: 125/82  Exercise Time (min): n/a METS: n/a   Predicted Max HR: 137 bpm % Max HR: 75.18 bpm Rate Pressure Product: 16068  Dose of Adenosine (mg):  n/a Dose of Lexiscan: 0.4 mg  Dose of Atropine (mg): n/a Dose of Dobutamine: n/a mcg/kg/min (at max HR)  Stress Test Technologist: Leane Para, CCT Nuclear Technologist: Imagene Riches, CNMT   Rest Procedure:  Myocardial perfusion imaging was performed at rest 45 minutes following the intravenous administration of Technetium 29m Sestamibi. Stress Procedure:  The patient received IV Lexiscan 0.4 mg over 15-seconds.  Technetium 47m Sestamibi injected IV at 30-seconds.  Patient experienced SOB, Lightheadedness and Headache and 75 mg of Aminophylline IV was administered at 5  minutes.  There were no significant changes with Lexiscan.  Quantitative spect images were obtained after a 45 minute delay.  Transient Ischemic Dilatation (Normal <1.22):  1.10  Lung/Heart Ratio (Normal <0.45):  0.27 QGS EDV:  n/a ml QGS ESV:  n/a ml LV Ejection Fraction: Study not gated  Imagene Riches, CNMT  PHYSICIAN INTERPRETATION  Rest ECG: Atrial Fibrilliation and Non-specific ST-T wave abnormalities  Stress ECG: Subtle Inferior & Lateral ST Depression with mild TWI.  QPS Raw Data Images:  Acquisition technically good; normal left ventricular size. Stress Images:  There is decreased uptake in the septum.  There is decreased uptake in the lateral wall. There is a moderate size/mild intensity mostly fixed perfusion defect involving the basal to mid septal wall, there is also a second small size/mild intensity mostly fixed perfusion defect in the apical inferolateral wall.   Rest Images:  There is decreased uptake in the septum. There is decreased uptake in the lateral wall. Comparison with the stress images reveals no significant change.  There is minimal reversibility in the septal defect mostly in the inferoseptal wall. Subtraction (SDS):  In the absence of gated images, cannot exclude the possibility of a fixed infarct with mild peri-infarct ischemia in the septal wall, however they've vascular distribution would be difficult to interpret.    There is a fixed defect that is most consistent with a previous infarction.  The lateral defect appears to be mostly  fixed, cannot exclude previous infarction versus artifact.  Impression Exercise Capacity:  Lexiscan with no exercise. BP Response:  Normal blood pressure response. Clinical Symptoms:  Dyspnea and lightheadedness ECG Impression:  Subtle inferior ST segment depressionss with mild  T wave inversions.. nondiagnostic LV Wall Motion:  Not assessed due to inability to gate with A. fib Comparison with Prior Nuclear Study: No images to  compare  Overall Impression:  Abnormal Myoview with a suggestion of a moderate sized fixed septal defect, however the nature the defect as such it does not necessary follow a strict pperfusion pattern.  The lack of gated imaging to evaluate wall motion decreases the effectiveness of interpretation of infarct versus artifact (image capturing artifact); LOW RISK Study.  Clinical correlation is warranted, and to consider echocardiography to assess for any septal wall motion abnormality which would suggest infarct versus artifact.  Given the distribution of the perfusion defect, the most likely explanation is acquisition/capturing artifact.     [name], MD  04/28/2013 12:50 PM

## 2013-04-30 ENCOUNTER — Telehealth: Payer: Self-pay | Admitting: Cardiology

## 2013-04-30 NOTE — Telephone Encounter (Signed)
I would like her to proceed with sleep study next week

## 2013-04-30 NOTE — Telephone Encounter (Signed)
Notified pt that Dr. Radford Pax would like for her to proceed with sleep study next week. She states she will keep appointment.

## 2013-04-30 NOTE — Telephone Encounter (Signed)
New message     Have sleep test appt scheduled for tues.  Pt want to see Dr Asencion Noble first.  Will this be OK with Dr Radford Pax?

## 2013-04-30 NOTE — Telephone Encounter (Signed)
Will forward for dr turner review

## 2013-05-04 ENCOUNTER — Encounter (HOSPITAL_COMMUNITY): Payer: Medicare Other

## 2013-05-04 ENCOUNTER — Other Ambulatory Visit: Payer: Self-pay | Admitting: General Surgery

## 2013-05-04 DIAGNOSIS — R079 Chest pain, unspecified: Secondary | ICD-10-CM

## 2013-05-05 ENCOUNTER — Encounter: Payer: Self-pay | Admitting: Cardiology

## 2013-05-05 DIAGNOSIS — G4733 Obstructive sleep apnea (adult) (pediatric): Secondary | ICD-10-CM | POA: Diagnosis not present

## 2013-05-05 DIAGNOSIS — R5381 Other malaise: Secondary | ICD-10-CM | POA: Diagnosis not present

## 2013-05-05 DIAGNOSIS — K859 Acute pancreatitis without necrosis or infection, unspecified: Secondary | ICD-10-CM | POA: Diagnosis not present

## 2013-05-05 DIAGNOSIS — K8309 Other cholangitis: Secondary | ICD-10-CM | POA: Diagnosis not present

## 2013-05-05 DIAGNOSIS — J45909 Unspecified asthma, uncomplicated: Secondary | ICD-10-CM | POA: Diagnosis not present

## 2013-05-05 DIAGNOSIS — R5383 Other fatigue: Secondary | ICD-10-CM | POA: Diagnosis not present

## 2013-05-05 DIAGNOSIS — G4721 Circadian rhythm sleep disorder, delayed sleep phase type: Secondary | ICD-10-CM | POA: Diagnosis not present

## 2013-05-05 DIAGNOSIS — D509 Iron deficiency anemia, unspecified: Secondary | ICD-10-CM | POA: Diagnosis not present

## 2013-05-05 DIAGNOSIS — I4891 Unspecified atrial fibrillation: Secondary | ICD-10-CM | POA: Diagnosis not present

## 2013-05-05 DIAGNOSIS — R6510 Systemic inflammatory response syndrome (SIRS) of non-infectious origin without acute organ dysfunction: Secondary | ICD-10-CM | POA: Diagnosis not present

## 2013-05-05 DIAGNOSIS — I495 Sick sinus syndrome: Secondary | ICD-10-CM | POA: Diagnosis not present

## 2013-05-06 ENCOUNTER — Telehealth: Payer: Self-pay | Admitting: Cardiology

## 2013-05-06 NOTE — Telephone Encounter (Signed)
Please let patient know that she has severe cental sleep apnea and needs CPAP titration.  Please set up CPAP titration ASAP with [facility] sleep center

## 2013-05-07 NOTE — Telephone Encounter (Signed)
Pt is aware of the results. I filled out form for pt to have titration. I gave to girls up front to fax to [facility]

## 2013-05-10 ENCOUNTER — Ambulatory Visit (INDEPENDENT_AMBULATORY_CARE_PROVIDER_SITE_OTHER): Payer: Medicare Other | Admitting: Pharmacist

## 2013-05-10 ENCOUNTER — Ambulatory Visit (HOSPITAL_COMMUNITY): Payer: Medicare Other | Attending: Cardiovascular Disease | Admitting: Radiology

## 2013-05-10 DIAGNOSIS — I482 Chronic atrial fibrillation, unspecified: Secondary | ICD-10-CM

## 2013-05-10 DIAGNOSIS — I4891 Unspecified atrial fibrillation: Secondary | ICD-10-CM | POA: Diagnosis not present

## 2013-05-10 DIAGNOSIS — I10 Essential (primary) hypertension: Secondary | ICD-10-CM | POA: Diagnosis not present

## 2013-05-10 DIAGNOSIS — R5383 Other fatigue: Secondary | ICD-10-CM | POA: Diagnosis not present

## 2013-05-10 DIAGNOSIS — I951 Orthostatic hypotension: Secondary | ICD-10-CM | POA: Diagnosis not present

## 2013-05-10 DIAGNOSIS — R072 Precordial pain: Secondary | ICD-10-CM

## 2013-05-10 DIAGNOSIS — I059 Rheumatic mitral valve disease, unspecified: Secondary | ICD-10-CM | POA: Diagnosis not present

## 2013-05-10 DIAGNOSIS — I079 Rheumatic tricuspid valve disease, unspecified: Secondary | ICD-10-CM | POA: Insufficient documentation

## 2013-05-10 DIAGNOSIS — E785 Hyperlipidemia, unspecified: Secondary | ICD-10-CM | POA: Diagnosis not present

## 2013-05-10 DIAGNOSIS — R079 Chest pain, unspecified: Secondary | ICD-10-CM | POA: Diagnosis not present

## 2013-05-10 DIAGNOSIS — R5381 Other malaise: Secondary | ICD-10-CM | POA: Diagnosis not present

## 2013-05-10 DIAGNOSIS — I495 Sick sinus syndrome: Secondary | ICD-10-CM | POA: Insufficient documentation

## 2013-05-10 DIAGNOSIS — Z5181 Encounter for therapeutic drug level monitoring: Secondary | ICD-10-CM

## 2013-05-10 LAB — POCT INR: INR: 3.5

## 2013-05-10 NOTE — Progress Notes (Signed)
Echocardiogram performed.  

## 2013-05-11 ENCOUNTER — Other Ambulatory Visit: Payer: Self-pay | Admitting: General Surgery

## 2013-05-11 DIAGNOSIS — I77810 Thoracic aortic ectasia: Secondary | ICD-10-CM

## 2013-05-14 ENCOUNTER — Ambulatory Visit: Payer: Medicare Other | Admitting: Cardiology

## 2013-05-24 ENCOUNTER — Ambulatory Visit (INDEPENDENT_AMBULATORY_CARE_PROVIDER_SITE_OTHER): Payer: Medicare Other | Admitting: Surgery

## 2013-05-24 DIAGNOSIS — I482 Chronic atrial fibrillation, unspecified: Secondary | ICD-10-CM

## 2013-05-24 DIAGNOSIS — I4891 Unspecified atrial fibrillation: Secondary | ICD-10-CM | POA: Diagnosis not present

## 2013-05-24 DIAGNOSIS — Z5181 Encounter for therapeutic drug level monitoring: Secondary | ICD-10-CM

## 2013-05-24 LAB — POCT INR: INR: 2.5

## 2013-05-26 ENCOUNTER — Encounter: Payer: Self-pay | Admitting: Cardiology

## 2013-05-26 DIAGNOSIS — G4733 Obstructive sleep apnea (adult) (pediatric): Secondary | ICD-10-CM | POA: Diagnosis not present

## 2013-05-26 DIAGNOSIS — M79609 Pain in unspecified limb: Secondary | ICD-10-CM | POA: Diagnosis not present

## 2013-05-31 ENCOUNTER — Telehealth: Payer: Self-pay | Admitting: Cardiology

## 2013-05-31 NOTE — Telephone Encounter (Signed)
Please let patient know that she was not able to be adequately titrated on CPAP and set up BiPAP titration at sleep lab

## 2013-06-03 DIAGNOSIS — D509 Iron deficiency anemia, unspecified: Secondary | ICD-10-CM | POA: Diagnosis not present

## 2013-06-03 DIAGNOSIS — R5381 Other malaise: Secondary | ICD-10-CM | POA: Diagnosis not present

## 2013-06-03 DIAGNOSIS — J45909 Unspecified asthma, uncomplicated: Secondary | ICD-10-CM | POA: Diagnosis not present

## 2013-06-03 DIAGNOSIS — R5383 Other fatigue: Secondary | ICD-10-CM | POA: Diagnosis not present

## 2013-06-03 DIAGNOSIS — G4733 Obstructive sleep apnea (adult) (pediatric): Secondary | ICD-10-CM | POA: Diagnosis not present

## 2013-06-03 DIAGNOSIS — M159 Polyosteoarthritis, unspecified: Secondary | ICD-10-CM | POA: Diagnosis not present

## 2013-06-04 NOTE — Telephone Encounter (Signed)
To Anna Farrell. 

## 2013-06-04 NOTE — Telephone Encounter (Signed)
Pt aware. Anna Farrell will fax to [facility] heart and sleep.

## 2013-06-14 ENCOUNTER — Ambulatory Visit (INDEPENDENT_AMBULATORY_CARE_PROVIDER_SITE_OTHER): Payer: Medicare Other

## 2013-06-14 DIAGNOSIS — I482 Chronic atrial fibrillation, unspecified: Secondary | ICD-10-CM

## 2013-06-14 DIAGNOSIS — I4891 Unspecified atrial fibrillation: Secondary | ICD-10-CM | POA: Diagnosis not present

## 2013-06-14 DIAGNOSIS — Z5181 Encounter for therapeutic drug level monitoring: Secondary | ICD-10-CM | POA: Diagnosis not present

## 2013-06-14 LAB — POCT INR: INR: 3

## 2013-06-16 DIAGNOSIS — G4733 Obstructive sleep apnea (adult) (pediatric): Secondary | ICD-10-CM | POA: Diagnosis not present

## 2013-07-05 ENCOUNTER — Other Ambulatory Visit: Payer: Self-pay

## 2013-07-05 ENCOUNTER — Other Ambulatory Visit: Payer: Self-pay | Admitting: *Deleted

## 2013-07-05 ENCOUNTER — Telehealth: Payer: Self-pay | Admitting: *Deleted

## 2013-07-05 MED ORDER — WARFARIN SODIUM 3 MG PO TABS: ORAL_TABLET | ORAL | Status: DC

## 2013-07-05 MED ORDER — PRAVASTATIN SODIUM 40 MG PO TABS: 40.0000 mg | ORAL_TABLET | Freq: Every day | ORAL | Status: DC

## 2013-07-05 NOTE — Telephone Encounter (Signed)
Refill

## 2013-07-08 ENCOUNTER — Other Ambulatory Visit: Payer: Self-pay

## 2013-07-08 ENCOUNTER — Telehealth: Payer: Self-pay

## 2013-07-08 DIAGNOSIS — H40019 Open angle with borderline findings, low risk, unspecified eye: Secondary | ICD-10-CM | POA: Diagnosis not present

## 2013-07-08 MED ORDER — PRAVASTATIN SODIUM 40 MG PO TABS: 40.0000 mg | ORAL_TABLET | Freq: Every day | ORAL | Status: DC

## 2013-07-08 MED ORDER — PRAVASTATIN SODIUM 40 MG PO TABS: 40.0000 mg | ORAL_TABLET | Freq: Every day | ORAL | Status: AC

## 2013-07-12 ENCOUNTER — Telehealth: Payer: Self-pay | Admitting: Cardiology

## 2013-07-12 MED ORDER — WARFARIN SODIUM 3 MG PO TABS: ORAL_TABLET | ORAL | Status: DC

## 2013-07-12 NOTE — Telephone Encounter (Signed)
Warfarin refill sent to pharmacy as requested. 

## 2013-07-12 NOTE — Telephone Encounter (Signed)
New problem   Pt had sleep test 06/20/13 and want to know her results. Please call pt.

## 2013-07-12 NOTE — Telephone Encounter (Signed)
Forward to Dr Radford Pax to advise.

## 2013-07-13 ENCOUNTER — Ambulatory Visit (INDEPENDENT_AMBULATORY_CARE_PROVIDER_SITE_OTHER): Payer: Medicare Other | Admitting: *Deleted

## 2013-07-13 DIAGNOSIS — Z5181 Encounter for therapeutic drug level monitoring: Secondary | ICD-10-CM

## 2013-07-13 DIAGNOSIS — I4891 Unspecified atrial fibrillation: Secondary | ICD-10-CM

## 2013-07-13 DIAGNOSIS — I482 Chronic atrial fibrillation, unspecified: Secondary | ICD-10-CM

## 2013-07-13 LAB — POCT INR: INR: 2.3

## 2013-07-14 ENCOUNTER — Telehealth: Payer: Self-pay | Admitting: Cardiology

## 2013-07-14 ENCOUNTER — Encounter: Payer: Self-pay | Admitting: General Surgery

## 2013-07-14 ENCOUNTER — Other Ambulatory Visit: Payer: Self-pay | Admitting: General Surgery

## 2013-07-14 DIAGNOSIS — G4733 Obstructive sleep apnea (adult) (pediatric): Secondary | ICD-10-CM

## 2013-07-14 NOTE — Telephone Encounter (Signed)
I will have results later today

## 2013-07-14 NOTE — Telephone Encounter (Signed)
Please let patient know that she had successful BiPAP titration.  Please order Resmed Bipap at 10/6cm H2O with heated humidifier, Bi-flex of 3 and small Resmed AIrfit P10 for her mask with chin strap and set up appt with me in 10 weeks

## 2013-07-14 NOTE — Telephone Encounter (Signed)
Pt is aware. Ordered and sent to [facility]. Scheduled pt for 10 week f/u appt.

## 2013-07-14 NOTE — Telephone Encounter (Signed)
Gave pt. results

## 2013-08-03 ENCOUNTER — Ambulatory Visit (INDEPENDENT_AMBULATORY_CARE_PROVIDER_SITE_OTHER): Payer: Medicare Other | Admitting: Pharmacist Clinician (PhC)/ Clinical Pharmacy Specialist

## 2013-08-03 DIAGNOSIS — I4891 Unspecified atrial fibrillation: Secondary | ICD-10-CM | POA: Diagnosis not present

## 2013-08-03 DIAGNOSIS — I482 Chronic atrial fibrillation, unspecified: Secondary | ICD-10-CM

## 2013-08-03 DIAGNOSIS — Z5181 Encounter for therapeutic drug level monitoring: Secondary | ICD-10-CM | POA: Diagnosis not present

## 2013-08-03 LAB — POCT INR: INR: 2.2

## 2013-08-15 ENCOUNTER — Emergency Department (HOSPITAL_COMMUNITY): Payer: Medicare Other

## 2013-08-15 ENCOUNTER — Encounter (HOSPITAL_COMMUNITY): Payer: Self-pay | Admitting: Emergency Medicine

## 2013-08-15 ENCOUNTER — Inpatient Hospital Stay (HOSPITAL_COMMUNITY)
Admission: EM | Admit: 2013-08-15 | Discharge: 2013-08-19 | DRG: 469 | Disposition: A | Discharge status: Rehabilitation Facility | Payer: Medicare Other | Attending: Internal Medicine | Admitting: Internal Medicine

## 2013-08-15 DIAGNOSIS — R5082 Postprocedural fever: Secondary | ICD-10-CM | POA: Diagnosis not present

## 2013-08-15 DIAGNOSIS — Z950 Presence of cardiac pacemaker: Secondary | ICD-10-CM

## 2013-08-15 DIAGNOSIS — R55 Syncope and collapse: Secondary | ICD-10-CM

## 2013-08-15 DIAGNOSIS — S79929A Unspecified injury of unspecified thigh, initial encounter: Secondary | ICD-10-CM | POA: Diagnosis not present

## 2013-08-15 DIAGNOSIS — Z96649 Presence of unspecified artificial hip joint: Secondary | ICD-10-CM | POA: Diagnosis not present

## 2013-08-15 DIAGNOSIS — I495 Sick sinus syndrome: Secondary | ICD-10-CM | POA: Diagnosis present

## 2013-08-15 DIAGNOSIS — J9600 Acute respiratory failure, unspecified whether with hypoxia or hypercapnia: Secondary | ICD-10-CM | POA: Diagnosis not present

## 2013-08-15 DIAGNOSIS — I5031 Acute diastolic (congestive) heart failure: Secondary | ICD-10-CM | POA: Diagnosis not present

## 2013-08-15 DIAGNOSIS — G8929 Other chronic pain: Secondary | ICD-10-CM | POA: Diagnosis present

## 2013-08-15 DIAGNOSIS — I10 Essential (primary) hypertension: Secondary | ICD-10-CM | POA: Diagnosis not present

## 2013-08-15 DIAGNOSIS — S72009A Fracture of unspecified part of neck of unspecified femur, initial encounter for closed fracture: Secondary | ICD-10-CM | POA: Diagnosis not present

## 2013-08-15 DIAGNOSIS — D696 Thrombocytopenia, unspecified: Secondary | ICD-10-CM | POA: Diagnosis not present

## 2013-08-15 DIAGNOSIS — S72109A Unspecified trochanteric fracture of unspecified femur, initial encounter for closed fracture: Secondary | ICD-10-CM | POA: Diagnosis not present

## 2013-08-15 DIAGNOSIS — I951 Orthostatic hypotension: Secondary | ICD-10-CM

## 2013-08-15 DIAGNOSIS — M810 Age-related osteoporosis without current pathological fracture: Secondary | ICD-10-CM | POA: Diagnosis present

## 2013-08-15 DIAGNOSIS — N390 Urinary tract infection, site not specified: Secondary | ICD-10-CM | POA: Diagnosis not present

## 2013-08-15 DIAGNOSIS — Z471 Aftercare following joint replacement surgery: Secondary | ICD-10-CM | POA: Diagnosis not present

## 2013-08-15 DIAGNOSIS — S72033A Displaced midcervical fracture of unspecified femur, initial encounter for closed fracture: Secondary | ICD-10-CM | POA: Diagnosis not present

## 2013-08-15 DIAGNOSIS — J189 Pneumonia, unspecified organism: Secondary | ICD-10-CM | POA: Diagnosis not present

## 2013-08-15 DIAGNOSIS — J45909 Unspecified asthma, uncomplicated: Secondary | ICD-10-CM | POA: Diagnosis present

## 2013-08-15 DIAGNOSIS — E785 Hyperlipidemia, unspecified: Secondary | ICD-10-CM

## 2013-08-15 DIAGNOSIS — J95821 Acute postprocedural respiratory failure: Secondary | ICD-10-CM | POA: Diagnosis not present

## 2013-08-15 DIAGNOSIS — Z79899 Other long term (current) drug therapy: Secondary | ICD-10-CM

## 2013-08-15 DIAGNOSIS — R740 Nonspecific elevation of levels of transaminase and lactic acid dehydrogenase [LDH]: Secondary | ICD-10-CM

## 2013-08-15 DIAGNOSIS — W010XXA Fall on same level from slipping, tripping and stumbling without subsequent striking against object, initial encounter: Secondary | ICD-10-CM | POA: Diagnosis present

## 2013-08-15 DIAGNOSIS — J9601 Acute respiratory failure with hypoxia: Secondary | ICD-10-CM

## 2013-08-15 DIAGNOSIS — Y846 Urinary catheterization as the cause of abnormal reaction of the patient, or of later complication, without mention of misadventure at the time of the procedure: Secondary | ICD-10-CM | POA: Diagnosis not present

## 2013-08-15 DIAGNOSIS — D62 Acute posthemorrhagic anemia: Secondary | ICD-10-CM | POA: Diagnosis not present

## 2013-08-15 DIAGNOSIS — Z5189 Encounter for other specified aftercare: Secondary | ICD-10-CM | POA: Diagnosis not present

## 2013-08-15 DIAGNOSIS — S72019A Unspecified intracapsular fracture of unspecified femur, initial encounter for closed fracture: Secondary | ICD-10-CM | POA: Diagnosis not present

## 2013-08-15 DIAGNOSIS — M545 Low back pain, unspecified: Secondary | ICD-10-CM | POA: Diagnosis present

## 2013-08-15 DIAGNOSIS — G4733 Obstructive sleep apnea (adult) (pediatric): Secondary | ICD-10-CM | POA: Diagnosis present

## 2013-08-15 DIAGNOSIS — M1990 Unspecified osteoarthritis, unspecified site: Secondary | ICD-10-CM | POA: Diagnosis not present

## 2013-08-15 DIAGNOSIS — I493 Ventricular premature depolarization: Secondary | ICD-10-CM

## 2013-08-15 DIAGNOSIS — Z5181 Encounter for therapeutic drug level monitoring: Secondary | ICD-10-CM

## 2013-08-15 DIAGNOSIS — E876 Hypokalemia: Secondary | ICD-10-CM | POA: Diagnosis not present

## 2013-08-15 DIAGNOSIS — K5903 Drug induced constipation: Secondary | ICD-10-CM

## 2013-08-15 DIAGNOSIS — R0602 Shortness of breath: Secondary | ICD-10-CM | POA: Diagnosis not present

## 2013-08-15 DIAGNOSIS — Z7901 Long term (current) use of anticoagulants: Secondary | ICD-10-CM | POA: Diagnosis not present

## 2013-08-15 DIAGNOSIS — Z01818 Encounter for other preprocedural examination: Secondary | ICD-10-CM | POA: Diagnosis not present

## 2013-08-15 DIAGNOSIS — R918 Other nonspecific abnormal finding of lung field: Secondary | ICD-10-CM | POA: Diagnosis not present

## 2013-08-15 DIAGNOSIS — T83511A Infection and inflammatory reaction due to indwelling urethral catheter, initial encounter: Secondary | ICD-10-CM | POA: Diagnosis not present

## 2013-08-15 DIAGNOSIS — R7401 Elevation of levels of liver transaminase levels: Secondary | ICD-10-CM

## 2013-08-15 DIAGNOSIS — R279 Unspecified lack of coordination: Secondary | ICD-10-CM | POA: Diagnosis not present

## 2013-08-15 DIAGNOSIS — K8309 Other cholangitis: Secondary | ICD-10-CM

## 2013-08-15 DIAGNOSIS — I482 Chronic atrial fibrillation, unspecified: Secondary | ICD-10-CM | POA: Diagnosis present

## 2013-08-15 DIAGNOSIS — R6510 Systemic inflammatory response syndrome (SIRS) of non-infectious origin without acute organ dysfunction: Secondary | ICD-10-CM

## 2013-08-15 DIAGNOSIS — I4891 Unspecified atrial fibrillation: Secondary | ICD-10-CM

## 2013-08-15 DIAGNOSIS — S79919A Unspecified injury of unspecified hip, initial encounter: Secondary | ICD-10-CM | POA: Diagnosis not present

## 2013-08-15 DIAGNOSIS — S298XXA Other specified injuries of thorax, initial encounter: Secondary | ICD-10-CM | POA: Diagnosis not present

## 2013-08-15 DIAGNOSIS — K838 Other specified diseases of biliary tract: Secondary | ICD-10-CM

## 2013-08-15 DIAGNOSIS — S72002A Fracture of unspecified part of neck of left femur, initial encounter for closed fracture: Secondary | ICD-10-CM

## 2013-08-15 LAB — CBC WITH DIFFERENTIAL/PLATELET
BASOS ABS: 0 10*3/uL (ref 0.0–0.1)
Basophils Relative: 0 % (ref 0–1)
Eosinophils Absolute: 0 10*3/uL (ref 0.0–0.7)
Eosinophils Relative: 0 % (ref 0–5)
HCT: 42.7 % (ref 36.0–46.0)
Hemoglobin: 13.7 g/dL (ref 12.0–15.0)
Lymphocytes Relative: 13 % (ref 12–46)
Lymphs Abs: 0.8 10*3/uL (ref 0.7–4.0)
MCH: 28.5 pg (ref 26.0–34.0)
MCHC: 32.1 g/dL (ref 30.0–36.0)
MCV: 89 fL (ref 78.0–100.0)
Monocytes Absolute: 0.8 10*3/uL (ref 0.1–1.0)
Monocytes Relative: 12 % (ref 3–12)
NEUTROS ABS: 4.8 10*3/uL (ref 1.7–7.7)
Neutrophils Relative %: 75 % (ref 43–77)
Platelets: 198 10*3/uL (ref 150–400)
RBC: 4.8 MIL/uL (ref 3.87–5.11)
RDW: 14.7 % (ref 11.5–15.5)
WBC: 6.4 10*3/uL (ref 4.0–10.5)

## 2013-08-15 LAB — BASIC METABOLIC PANEL
ANION GAP: 10 (ref 5–15)
BUN: 9 mg/dL (ref 6–23)
CALCIUM: 9 mg/dL (ref 8.4–10.5)
CO2: 28 meq/L (ref 19–32)
Chloride: 103 mEq/L (ref 96–112)
Creatinine, Ser: 0.63 mg/dL (ref 0.50–1.10)
GFR calc Af Amer: >90 mL/min (ref >90)
GFR calc non Af Amer: 81 mL/min — ABNORMAL LOW (ref >90)
Glucose, Bld: 124 mg/dL — ABNORMAL HIGH (ref 70–99)
Potassium: 4.1 mEq/L (ref 3.7–5.3)
SODIUM: 141 meq/L (ref 137–147)

## 2013-08-15 LAB — PROTIME-INR
INR: 3 — AB (ref 0.00–1.49)
PROTHROMBIN TIME: 31.1 s — AB (ref 11.6–15.2)

## 2013-08-15 LAB — TYPE AND SCREEN
ABO/RH(D): O POS
ANTIBODY SCREEN: NEGATIVE

## 2013-08-15 LAB — ABO/RH: ABO/RH(D): O POS

## 2013-08-15 MED ORDER — VITAMIN D3 25 MCG (1000 UNIT) PO TABS
2000.0000 [IU] | ORAL_TABLET | Freq: Every day | ORAL | Status: DC
  Administered 2013-08-15 – 2013-08-19 (×4): 2000 [IU] via ORAL
  Filled 2013-08-15 (×5): qty 2

## 2013-08-15 MED ORDER — VITAMIN D3 25 MCG (1000 UT) PO CAPS: 2.0000 | ORAL_CAPSULE | Freq: Every day | ORAL | Status: DC

## 2013-08-15 MED ORDER — HYDROCODONE-ACETAMINOPHEN 5-325 MG PO TABS
1.0000 | ORAL_TABLET | Freq: Four times a day (QID) | ORAL | Status: DC | PRN
  Administered 2013-08-15: 2 via ORAL
  Filled 2013-08-15 (×2): qty 2

## 2013-08-15 MED ORDER — SODIUM CHLORIDE 0.9 % IV SOLN
1000.0000 mL | INTRAVENOUS | Status: DC
  Administered 2013-08-15 – 2013-08-17 (×5): 1000 mL via INTRAVENOUS

## 2013-08-15 MED ORDER — METOPROLOL SUCCINATE ER 25 MG PO TB24
25.0000 mg | ORAL_TABLET | Freq: Two times a day (BID) | ORAL | Status: DC
  Administered 2013-08-15 – 2013-08-19 (×7): 25 mg via ORAL
  Filled 2013-08-15 (×9): qty 1

## 2013-08-15 MED ORDER — FLUTICASONE PROPIONATE HFA 44 MCG/ACT IN AERO
2.0000 | INHALATION_SPRAY | Freq: Two times a day (BID) | RESPIRATORY_TRACT | Status: DC
  Administered 2013-08-15 – 2013-08-19 (×6): 2 via RESPIRATORY_TRACT
  Filled 2013-08-15 (×3): qty 10.6

## 2013-08-15 MED ORDER — MORPHINE SULFATE 4 MG/ML IJ SOLN
4.0000 mg | INTRAMUSCULAR | Status: DC | PRN
  Administered 2013-08-15: 4 mg via INTRAVENOUS
  Filled 2013-08-15: qty 1

## 2013-08-15 MED ORDER — PHYTONADIONE 5 MG PO TABS
5.0000 mg | ORAL_TABLET | Freq: Once | ORAL | Status: AC
  Administered 2013-08-15: 5 mg via ORAL
  Filled 2013-08-15: qty 1

## 2013-08-15 MED ORDER — VITAMIN B-12 100 MCG PO TABS
100.0000 ug | ORAL_TABLET | ORAL | Status: DC
  Administered 2013-08-15: 100 ug via ORAL
  Filled 2013-08-15: qty 1

## 2013-08-15 MED ORDER — SIMVASTATIN 20 MG PO TABS
20.0000 mg | ORAL_TABLET | Freq: Every day | ORAL | Status: DC
  Administered 2013-08-16 – 2013-08-18 (×3): 20 mg via ORAL
  Filled 2013-08-15 (×6): qty 1

## 2013-08-15 MED ORDER — SODIUM CHLORIDE 0.9 % IV SOLN
1000.0000 mL | Freq: Once | INTRAVENOUS | Status: AC
  Administered 2013-08-15: 1000 mL via INTRAVENOUS

## 2013-08-15 MED ORDER — MORPHINE SULFATE 2 MG/ML IJ SOLN
2.0000 mg | INTRAMUSCULAR | Status: DC | PRN
  Administered 2013-08-16 – 2013-08-18 (×11): 2 mg via INTRAVENOUS
  Filled 2013-08-15 (×11): qty 1

## 2013-08-15 NOTE — ED Provider Notes (Addendum)
CSN: [id number]     Arrival date & time 08/15/13  1435 History   First MD Initiated Contact with Patient 08/15/13 1436     Chief Complaint  Patient presents with  . Fall      HPI Patient presents to the emergency department after a fall where she slipped and injured her left hip today.  She was in [place] when this occurred and was evaluated in outside hospital, have a left femoral neck fracture.  Patient preferred to be operated on closer to home and therefore she was transferred to [facility] from the outside emergency department.  Excepting orthopedic surgeon was Dr. Fredonia Highland.  Patient denies head injury.  She denies chest pain shortness of breath.  No abdominal pain.  She reports ongoing pain and discomfort in her left hip.  She received morphine with some improvement in her symptoms but reports her pain is back to a 7/10 at this time.  She is on Coumadin for history of atrial fibrillation.   Past Medical History  Diagnosis Date  . Vasovagal syncope   . Hypertension   . Hyperlipidemia   . Orthostatic hypotension   . Osteoporosis   . Pacemaker 10/11/06    implanted by Dr Leonia Reeves for pauses > 4 seconds with afib  . Iron deficiency anemia   . Shortness of breath   . Arthritis   . Colitis 5/13  . Chronic low back pain 2008    crushed vertebrae  . Tachycardia-bradycardia syndrome   . Permanent atrial fibrillation   . Asymptomatic PVCs   . History of colonic polyps   . Pure hypercholesterolemia   . Extrinsic asthma, unspecified     LOV Dr Joya Gaskins 7/12  . Long term (current) use of anticoagulants   . Cholelithiasis   . Generalized osteoarthrosis, unspecified site    Past Surgical History  Procedure Laterality Date  . Vesicovaginal fistula closure w/ tah  1981  . Pacemaker insertion  10/11/2006    SJM Zephyr XL DR implanted by Dr Leonia Reeves for afib with pause > 4 seconds  . Colonoscopy    . Appendectomy    . Abdominal hysterectomy    .  Cholecystectomy  09/17/2011    Procedure: LAPAROSCOPIC CHOLECYSTECTOMY;  Surgeon: Rolm Bookbinder, MD;  Location: [facility] ORS;  Service: General;;  . Cardiac catheterization  2009    Normal coronary arteries  . Ercp N/A 04/07/2013    Procedure: ENDOSCOPIC RETROGRADE CHOLANGIOPANCREATOGRAPHY (ERCP);  Surgeon: Jeryl Columbia, MD;  Location: [facility];  Service: Endoscopy;  Laterality: N/A;  . Eus  04/07/2013    Procedure: UPPER ENDOSCOPIC ULTRASOUND (EUS) RADIAL;  Surgeon: Jeryl Columbia, MD;  Location: [facility] ENDOSCOPY;  Service: Endoscopy;;   Family History  Problem Relation Age of Onset  . Hypertension Mother    History  Substance Use Topics  . Smoking status: Never Smoker   . Smokeless tobacco: Never Used  . Alcohol Use: No   OB History   Grav Para Term Preterm Abortions TAB SAB Ect Mult Living                 Review of Systems  All other systems reviewed and are negative.     Allergies  Codeine  Home Medications   Prior to Admission medications   Medication Sig Start Date End Date Taking? Authorizing Provider  amLODipine (NORVASC) 5 MG tablet Take 5 mg by mouth daily.   Yes Historical Provider, MD  beclomethasone (QVAR) 40 MCG/ACT  inhaler Inhale 2 puffs into the lungs 2 (two) times daily as needed (breathing, shortness of breath).   Yes Historical Provider, MD  Cholecalciferol (VITAMIN D3) 1000 UNITS CAPS Take 2 capsules by mouth daily.    Yes Historical Provider, MD  lidocaine (LIDODERM) 5 % Place 1 patch onto the skin daily.  10/06/12  Yes Historical Provider, MD  metoprolol succinate (TOPROL-XL) 25 MG 24 hr tablet Take 25 mg by mouth 2 (two) times daily.   Yes Historical Provider, MD  pravastatin (PRAVACHOL) 40 MG tablet Take 1 tablet (40 mg total) by mouth daily. Once daily 07/08/13  Yes Sueanne Margarita, MD  traMADol (ULTRAM) 50 MG tablet Take 50 mg by mouth every 8 (eight) hours as needed. As needed for pain. Two tabs in the morning (100 mg) two tabs in evening (100 mg ) total 200  mg daily   Yes Historical Provider, MD  TRAVATAN Z 0.004 % SOLN ophthalmic solution Place 1 drop into both eyes daily. 01/04/13  Yes Historical Provider, MD  vitamin B-12 (CYANOCOBALAMIN) 100 MCG tablet Take 100 mcg by mouth once a week.   Yes Historical Provider, MD  warfarin (COUMADIN) 3 MG tablet 1 tablet daily except 1.5 tablets on Thursdays or as directed by coumadin clinic 07/12/13  Yes Sueanne Margarita, MD  nitroGLYCERIN (NITROSTAT) 0.4 MG SL tablet Place 1 tablet (0.4 mg total) under the tongue every 5 (five) minutes as needed for chest pain. 04/16/13   Thompson Grayer, MD   BP 126/85  Pulse 87  Temp(Src) 98.4 F (36.9 C) (Oral)  Resp 18  Ht 5\' 2"  (1.575 m)  Wt 136 lb (61.689 kg)  BMI 24.87 kg/m2  SpO2 90% Physical Exam  Nursing note and vitals reviewed. Constitutional: She is oriented to person, place, and time. She appears well-developed and well-nourished. No distress.  HENT:  Head: Normocephalic and atraumatic.  Eyes: EOM are normal.  Neck: Normal range of motion.  C-spine nontender and cleared by Nexus criteria.  Cardiovascular: Normal rate, regular rhythm and normal heart sounds.   Pulmonary/Chest: Effort normal and breath sounds normal.  Abdominal: Soft. She exhibits no distension. There is no tenderness.  Musculoskeletal: Normal range of motion.  Pain with range of motion of left hip.  Mild shortening without obvious rotation.  Normal PT and DP pulse in left foot.  Able to we will her toes on her left foot.  Neurological: She is alert and oriented to person, place, and time.  Skin: Skin is warm and dry.  Psychiatric: She has a normal mood and affect. Judgment normal.    ED Course  Procedures (including critical care time) Labs Review Labs Reviewed  PROTIME-INR - Abnormal; Notable for the following:    Prothrombin Time 31.1 (*)    INR 3.00 (*)    All other components within normal limits  CBC WITH DIFFERENTIAL  BASIC METABOLIC PANEL  TYPE AND SCREEN    Imaging  Review Dg Chest 1 View  08/15/2013   CLINICAL DATA:  80 year old female with hip fracture. Preoperative respiratory examination. Fall with left-sided pain.  EXAM: CHEST - 1 VIEW  COMPARISON:  04/06/2013  FINDINGS: Cardiomegaly and left-sided pacemaker again noted.  There is no evidence of focal airspace disease, pulmonary edema, suspicious pulmonary nodule/mass, pleural effusion, or pneumothorax. No acute bony abnormalities are identified.  IMPRESSION: Cardiomegaly without evidence of active cardiopulmonary disease.   Electronically Signed   By: Hassan Rowan M.D.   On: 08/15/2013 15:56   Dg Hip  Complete Left  08/15/2013   CLINICAL DATA:  Hip pain.  Fell.  EXAM: LEFT HIP - COMPLETE 2+ VIEW  COMPARISON:  None.  FINDINGS: There is an impacted subcapital LEFT hip fracture. Osteopenia is noted. No pelvic fractures are seen. There is moderate stool burden. Buttock granuloma.  IMPRESSION: Impacted subcapital LEFT hip fracture.   Electronically Signed   By: Rolla Flatten M.D.   On: 08/15/2013 15:56    ECG interpretation   Date: 08/15/2013  Rate: 89  Rhythm: atrial fibrillation  QRS Axis: normal  Intervals: normal  ST/T Wave abnormalities: normal  Conduction Disutrbances: none  Narrative Interpretation:   Old EKG Reviewed: No significant changes noted     MDM   Final diagnoses:  Closed left hip fracture, initial encounter    No head injury or loss consciousness.  C-spine is nontender.  Unable to pull up x-ray images from outside hospital.  Patient will have repeat images completed.  Labs pending.  Pain treated.  N.p.o.  Plan admit to hospital.  4:53 PM I spoke with Dr. Percell Miller who agrees to consult on her hip fracture.  He requests vitamin K for her INR.  Her INR today is 3.  She is on Coumadin for atrial fibrillation.  He plans to operate on her tomorrow afternoon.  She'll need to be n.p.o. after midnight.  She can eat and drink now   Hoy Morn, MD 08/15/13 [place],  MD 08/15/13 [phone]

## 2013-08-15 NOTE — H&P (Signed)
History and Physical  Anna Farrell NFA:[id number] DOB: 1927/08/02 DOA: 08/15/2013   PCP: [name], MD   Chief Complaint: Mechanical fall with left hip pain  HPI:  80 year old female with a history of hypertension, hyperlipidemia, permanent atrial fibrillation on warfarin, sick sinus syndrome, and asthma presents after a mechanical fall. The patient was at her beach house at [facility] and was taken to [facility].  She wanted to be transported to [facility].  The patient denies any syncope. She states she has been her usual state of health prior to mechanical fall. The patient was getting out of bed to go to the bathroom when she felt that she slipped and fell onto her left side. She had significant pain and could not get up. Left hip X-ray at [facility] revealed a left femoral neck fracture.   Patient denies fevers, chills, headache, chest pain, dyspnea, nausea, vomiting, diarrhea, abdominal pain, dysuria, hematuria In the ED at [facility], the patient's BMP and CBC were unremarkable. INR was 3.00. The patient was hemodynamically stable. The patient was given morphine 4 mg IV. EKG showed atrial fibrillation with nonspecific ST changes. Chest x-ray was negative for any acute changes.  Assessment/Plan: Left subcapital hip fracture  -ortho--Dr. Percell Miller plans to operate on 08/16/13 -pain control -PT/OT after surgery Permanent Atrial Fibrillation -rate controlled -Continue metoprolol succinate  -Dr. Percell Miller has ordered for the patient to have vitamin K, 5mg  po given in ED -INR in am -if still coagulopathic in am, FFP can be given Hypertension  -Controlled  -Continue metoprolol succinate -patient states that she is not taking amlodipine which was listed on her med rec  Sick sinus syndrome -s/p PPM Hyperlipidemia -Continue statin Asthma -continue QVar Hx of vertebral compression fracture -s/p kyphoplasty      Past Medical History  Diagnosis Date  . Vasovagal  syncope   . Hypertension   . Hyperlipidemia   . Orthostatic hypotension   . Osteoporosis   . Pacemaker 10/11/06    implanted by Dr Leonia Reeves for pauses > 4 seconds with afib  . Iron deficiency anemia   . Shortness of breath   . Arthritis   . Colitis 5/13  . Chronic low back pain 2008    crushed vertebrae  . Tachycardia-bradycardia syndrome   . Permanent atrial fibrillation   . Asymptomatic PVCs   . History of colonic polyps   . Pure hypercholesterolemia   . Extrinsic asthma, unspecified     LOV Dr Joya Gaskins 7/12  . Long term (current) use of anticoagulants   . Cholelithiasis   . Generalized osteoarthrosis, unspecified site    Past Surgical History  Procedure Laterality Date  . Vesicovaginal fistula closure w/ tah  1981  . Pacemaker insertion  10/11/2006    SJM Zephyr XL DR implanted by Dr Leonia Reeves for afib with pause > 4 seconds  . Colonoscopy    . Appendectomy    . Abdominal hysterectomy    . Cholecystectomy  09/17/2011    Procedure: LAPAROSCOPIC CHOLECYSTECTOMY;  Surgeon: Rolm Bookbinder, MD;  Location: [facility];  Service: General;;  . Cardiac catheterization  2009    Normal coronary arteries  . Ercp N/A 04/07/2013    Procedure: ENDOSCOPIC RETROGRADE CHOLANGIOPANCREATOGRAPHY (ERCP);  Surgeon: Jeryl Columbia, MD;  Location: [facility];  Service: Endoscopy;  Laterality: N/A;  . Eus  04/07/2013    Procedure: UPPER ENDOSCOPIC ULTRASOUND (EUS) RADIAL;  Surgeon: Jeryl Columbia, MD;  Location: [facility];  Service: Endoscopy;;  Social History:  reports that she has never smoked. She has never used smokeless tobacco. She reports that she does not drink alcohol or use illicit drugs.   Family History  Problem Relation Age of Onset  . Hypertension Mother      Allergies  Allergen Reactions  . Codeine Nausea And Vomiting    Passed out      Prior to Admission medications   Medication Sig Start Date End Date Taking? Authorizing Provider  amLODipine (NORVASC) 5 MG tablet Take 5 mg  by mouth daily.   Yes Historical Provider, MD  beclomethasone (QVAR) 40 MCG/ACT inhaler Inhale 2 puffs into the lungs 2 (two) times daily as needed (breathing, shortness of breath).   Yes Historical Provider, MD  Cholecalciferol (VITAMIN D3) 1000 UNITS CAPS Take 2 capsules by mouth daily.    Yes Historical Provider, MD  lidocaine (LIDODERM) 5 % Place 1 patch onto the skin daily.  10/06/12  Yes Historical Provider, MD  metoprolol succinate (TOPROL-XL) 25 MG 24 hr tablet Take 25 mg by mouth 2 (two) times daily.   Yes Historical Provider, MD  pravastatin (PRAVACHOL) 40 MG tablet Take 1 tablet (40 mg total) by mouth daily. Once daily 07/08/13  Yes Sueanne Margarita, MD  traMADol (ULTRAM) 50 MG tablet Take 50 mg by mouth every 8 (eight) hours as needed. As needed for pain. Two tabs in the morning (100 mg) two tabs in evening (100 mg ) total 200 mg daily   Yes Historical Provider, MD  TRAVATAN Z 0.004 % SOLN ophthalmic solution Place 1 drop into both eyes daily. 01/04/13  Yes Historical Provider, MD  vitamin B-12 (CYANOCOBALAMIN) 100 MCG tablet Take 100 mcg by mouth once a week.   Yes Historical Provider, MD  warfarin (COUMADIN) 3 MG tablet 1 tablet daily except 1.5 tablets on Thursdays or as directed by coumadin clinic 07/12/13  Yes Sueanne Margarita, MD  nitroGLYCERIN (NITROSTAT) 0.4 MG SL tablet Place 1 tablet (0.4 mg total) under the tongue every 5 (five) minutes as needed for chest pain. 04/16/13   Thompson Grayer, MD    Review of Systems:  Constitutional:  No weight loss, night sweats, Fevers, chills, fatigue.  Head&Eyes: No headache.  No vision loss.  No eye pain or scotoma ENT:  No Difficulty swallowing,Tooth/dental problems,Sore throat,  No ear ache, post nasal drip,  Cardio-vascular:  No chest pain, Orthopnea, PND, swelling in lower extremities,  dizziness, palpitations  GI:  No  abdominal pain, nausea, vomiting, diarrhea, loss of appetite, hematochezia, melena, heartburn, indigestion, Resp:  No  shortness of breath with exertion or at rest. No cough. No coughing up of blood .No wheezing.No chest wall deformity  Skin:  no rash or lesions.  GU:  no dysuria, change in color of urine, no urgency or frequency. No flank pain.  Musculoskeletal:  No joint pain or swelling. No decreased range of motion.  Psych:  No change in mood or affect.  Neurologic: No headache, no dysesthesia, no focal weakness, no vision loss. No syncope  Physical Exam: Filed Vitals:   08/15/13 1500 08/15/13 1515 08/15/13 1635 08/15/13 1645  BP: 136/87 120/58 126/85 144/87  Pulse: 100 67 87 81  Temp:      TempSrc:      Resp: 17 21 18    Height:      Weight:      SpO2: 93% 95% 90% 91%   General:  A&O x 3, NAD, nontoxic, pleasant/cooperative Head/Eye: No conjunctival hemorrhage, no icterus,  [place]/AT, No nystagmus ENT:  No icterus,  No thrush, good dentition, no pharyngeal exudate Neck:  No masses, no lymphadenpathy, no bruits CV:  IRRR, no rub, no gallop Lung:  Bilateral scattered rales, no wheeze, good air movement Abdomen: soft/NT, +BS, nondistended, no peritoneal signs Ext: No cyanosis, No rashes, No petechiae, No lymphangitis, 1+LE edema   Labs on Admission:  Basic Metabolic Panel:  Recent Labs Lab 08/15/13 1603  NA 141  K 4.1  CL 103  CO2 28  GLUCOSE 124*  BUN 9  CREATININE 0.63  CALCIUM 9.0   Liver Function Tests: No results found for this basename: AST, ALT, ALKPHOS, BILITOT, PROT, ALBUMIN,  in the last 168 hours No results found for this basename: LIPASE, AMYLASE,  in the last 168 hours No results found for this basename: AMMONIA,  in the last 168 hours CBC:  Recent Labs Lab 08/15/13 1603  WBC 6.4  NEUTROABS 4.8  HGB 13.7  HCT 42.7  MCV 89.0  PLT 198   Cardiac Enzymes: No results found for this basename: CKTOTAL, CKMB, CKMBINDEX, TROPONINI,  in the last 168 hours BNP: No components found with this basename: POCBNP,  CBG: No results found for this basename: GLUCAP,  in the  last 168 hours  Radiological Exams on Admission: Dg Chest 1 View  08/15/2013   CLINICAL DATA:  80 year old female with hip fracture. Preoperative respiratory examination. Fall with left-sided pain.  EXAM: CHEST - 1 VIEW  COMPARISON:  04/06/2013  FINDINGS: Cardiomegaly and left-sided pacemaker again noted.  There is no evidence of focal airspace disease, pulmonary edema, suspicious pulmonary nodule/mass, pleural effusion, or pneumothorax. No acute bony abnormalities are identified.  IMPRESSION: Cardiomegaly without evidence of active cardiopulmonary disease.   Electronically Signed   By: Hassan Rowan M.D.   On: 08/15/2013 15:56   Dg Hip Complete Left  08/15/2013   CLINICAL DATA:  Hip pain.  Fell.  EXAM: LEFT HIP - COMPLETE 2+ VIEW  COMPARISON:  None.  FINDINGS: There is an impacted subcapital LEFT hip fracture. Osteopenia is noted. No pelvic fractures are seen. There is moderate stool burden. Buttock granuloma.  IMPRESSION: Impacted subcapital LEFT hip fracture.   Electronically Signed   By: Rolla Flatten M.D.   On: 08/15/2013 15:56    EKG: Independently reviewed. Afib, nonspecific ST changes    Time spent:50 minutes Code Status:   FULL Family Communication:   Son at bedside   Kavita Bartl, DO  [facility] Pager [phone]  If 7PM-7AM, please contact night-coverage [email or web address] Password [email or web address] 08/15/2013, 5:51 PM

## 2013-08-15 NOTE — Consult Note (Signed)
ORTHOPAEDIC CONSULTATION  REQUESTING PHYSICIAN: Orson Eva, MD  Chief Complaint: Left hip fracture  HPI: Anna Farrell is a 80 y.o. female who complains of  A mechanical fall onto the left side. She is a Hydrographic surveyor.   Past Medical History  Diagnosis Date  . Vasovagal syncope   . Hypertension   . Hyperlipidemia   . Orthostatic hypotension   . Osteoporosis   . Pacemaker 10/11/06    implanted by Dr Leonia Reeves for pauses > 4 seconds with afib  . Iron deficiency anemia   . Shortness of breath   . Arthritis   . Colitis 5/13  . Chronic low back pain 2008    crushed vertebrae  . Tachycardia-bradycardia syndrome   . Permanent atrial fibrillation   . Asymptomatic PVCs   . History of colonic polyps   . Pure hypercholesterolemia   . Extrinsic asthma, unspecified     LOV Dr Joya Gaskins 7/12  . Long term (current) use of anticoagulants   . Cholelithiasis   . Generalized osteoarthrosis, unspecified site    Past Surgical History  Procedure Laterality Date  . Vesicovaginal fistula closure w/ tah  1981  . Pacemaker insertion  10/11/2006    SJM Zephyr XL DR implanted by Dr Leonia Reeves for afib with pause > 4 seconds  . Colonoscopy    . Appendectomy    . Abdominal hysterectomy    . Cholecystectomy  09/17/2011    Procedure: LAPAROSCOPIC CHOLECYSTECTOMY;  Surgeon: Rolm Bookbinder, MD;  Location: [facility];  Service: General;;  . Cardiac catheterization  2009    Normal coronary arteries  . Ercp N/A 04/07/2013    Procedure: ENDOSCOPIC RETROGRADE CHOLANGIOPANCREATOGRAPHY (ERCP);  Surgeon: Jeryl Columbia, MD;  Location: [facility];  Service: Endoscopy;  Laterality: N/A;  . Eus  04/07/2013    Procedure: UPPER ENDOSCOPIC ULTRASOUND (EUS) RADIAL;  Surgeon: Jeryl Columbia, MD;  Location: [facility];  Service: Endoscopy;;   History   Social History  . Marital Status: Widowed    Spouse Name: N/A    Number of Children: N/A  . Years of Education: N/A   Occupational History  . Waitress    [employer]   Social History Main Topics  . Smoking status: Never Smoker   . Smokeless tobacco: Never Used  . Alcohol Use: No  . Drug Use: No  . Sexual Activity: No   Other Topics Concern  . None   Social History Narrative  . None   Family History  Problem Relation Age of Onset  . Hypertension Mother    Allergies  Allergen Reactions  . Codeine Nausea And Vomiting    Passed out   Prior to Admission medications   Medication Sig Start Date End Date Taking? Authorizing Provider  amLODipine (NORVASC) 5 MG tablet Take 5 mg by mouth daily.   Yes Historical Provider, MD  beclomethasone (QVAR) 40 MCG/ACT inhaler Inhale 2 puffs into the lungs 2 (two) times daily as needed (breathing, shortness of breath).   Yes Historical Provider, MD  Cholecalciferol (VITAMIN D3) 1000 UNITS CAPS Take 2 capsules by mouth daily.    Yes Historical Provider, MD  lidocaine (LIDODERM) 5 % Place 1 patch onto the skin daily.  10/06/12  Yes Historical Provider, MD  metoprolol succinate (TOPROL-XL) 25 MG 24 hr tablet Take 25 mg by mouth 2 (two) times daily.   Yes Historical Provider, MD  pravastatin (PRAVACHOL) 40 MG tablet Take 1 tablet (40 mg total) by mouth daily. Once daily  07/08/13  Yes Sueanne Margarita, MD  traMADol (ULTRAM) 50 MG tablet Take 50 mg by mouth every 8 (eight) hours as needed. As needed for pain. Two tabs in the morning (100 mg) two tabs in evening (100 mg ) total 200 mg daily   Yes Historical Provider, MD  TRAVATAN Z 0.004 % SOLN ophthalmic solution Place 1 drop into both eyes daily. 01/04/13  Yes Historical Provider, MD  vitamin B-12 (CYANOCOBALAMIN) 100 MCG tablet Take 100 mcg by mouth once a week.   Yes Historical Provider, MD  warfarin (COUMADIN) 3 MG tablet 1 tablet daily except 1.5 tablets on Thursdays or as directed by coumadin clinic 07/12/13  Yes Sueanne Margarita, MD  nitroGLYCERIN (NITROSTAT) 0.4 MG SL tablet Place 1 tablet (0.4 mg total) under the tongue every 5 (five) minutes as needed for chest  pain. 04/16/13   Thompson Grayer, MD   Dg Chest 1 View  08/15/2013   CLINICAL DATA:  80 year old female with hip fracture. Preoperative respiratory examination. Fall with left-sided pain.  EXAM: CHEST - 1 VIEW  COMPARISON:  04/06/2013  FINDINGS: Cardiomegaly and left-sided pacemaker again noted.  There is no evidence of focal airspace disease, pulmonary edema, suspicious pulmonary nodule/mass, pleural effusion, or pneumothorax. No acute bony abnormalities are identified.  IMPRESSION: Cardiomegaly without evidence of active cardiopulmonary disease.   Electronically Signed   By: Hassan Rowan M.D.   On: 08/15/2013 15:56   Dg Hip Complete Left  08/15/2013   CLINICAL DATA:  Hip pain.  Fell.  EXAM: LEFT HIP - COMPLETE 2+ VIEW  COMPARISON:  None.  FINDINGS: There is an impacted subcapital LEFT hip fracture. Osteopenia is noted. No pelvic fractures are seen. There is moderate stool burden. Buttock granuloma.  IMPRESSION: Impacted subcapital LEFT hip fracture.   Electronically Signed   By: Rolla Flatten M.D.   On: 08/15/2013 15:56    Positive ROS: All other systems have been reviewed and were otherwise negative with the exception of those mentioned in the HPI and as above.  Labs cbc  Recent Labs  08/15/13 1603  WBC 6.4  HGB 13.7  HCT 42.7  PLT 198    Labs inflam No results found for this basename: ESR, CRP,  in the last 72 hours  Labs coag  Recent Labs  08/15/13 1603  INR 3.00*     Recent Labs  08/15/13 1603  NA 141  K 4.1  CL 103  CO2 28  GLUCOSE 124*  BUN 9  CREATININE 0.63  CALCIUM 9.0    Physical Exam: Filed Vitals:   08/15/13 1916  BP: 138/63  Pulse: 92  Temp: 98.9 F (37.2 C)  Resp: 18   General: Alert, no acute distress Cardiovascular: No pedal edema Respiratory: No cyanosis, no use of accessory musculature GI: No organomegaly, abdomen is soft and non-tender Skin: No lesions in the area of chief complaint other than those listed below in MSK exam.  Neurologic:  Sensation intact distally Psychiatric: Patient is competent for consent with normal mood and affect Lymphatic: No axillary or cervical lymphadenopathy  MUSCULOSKELETAL:  SILT DP/SP/S/S/T nerve, 2+ DP, +TA/GS/EHL Compartments soft  Other extremities are atraumatic with painless ROM and NVI.  Assessment: Left fem neck fracture, displaced  Plan: Left hip hemiarthroplasty when INR is below 1.7 Weight Bearing Status: bedrest for now then WBAT post op.  PT VTE px: SCD's and chemical post op.    Edmonia Lynch, D, MD Cell [phone]   08/15/2013 10:01 PM

## 2013-08-15 NOTE — ED Notes (Signed)
Patient transported to X-ray 

## 2013-08-15 NOTE — ED Notes (Addendum)
Per CCMT, pt comes from [facility] on vacation. Pt fell at 0500 this am. Pt did not hit head on anything or LOC. Pt A&Ox4, NAD noted. Pt fell and fx left femoral hip. Pt has h/o HTN, AFIB, and chronic back pain. 20G IV placed in LFA, 4mg  IV morphine given in prior hospital. Foley catheter placed. VSS: BP 110/82, P86, 92-93% rm air. Pt stable.

## 2013-08-15 NOTE — ED Notes (Signed)
Dr Tat at bedside 

## 2013-08-16 LAB — CBC
HCT: 42 % (ref 36.0–46.0)
Hemoglobin: 13.2 g/dL (ref 12.0–15.0)
MCH: 27.7 pg (ref 26.0–34.0)
MCHC: 31.4 g/dL (ref 30.0–36.0)
MCV: 88.1 fL (ref 78.0–100.0)
PLATELETS: 180 10*3/uL (ref 150–400)
RBC: 4.77 MIL/uL (ref 3.87–5.11)
RDW: 15 % (ref 11.5–15.5)
WBC: 6.4 10*3/uL (ref 4.0–10.5)

## 2013-08-16 LAB — PROTIME-INR
INR: 2.65 — AB (ref 0.00–1.49)
INR: 1.87 — ABNORMAL HIGH (ref 0.00–1.49)
PROTHROMBIN TIME: 28.3 s — AB (ref 11.6–15.2)
PROTHROMBIN TIME: 21.5 s — AB (ref 11.6–15.2)

## 2013-08-16 LAB — MRSA PCR SCREENING: MRSA BY PCR: NEGATIVE

## 2013-08-16 MED ORDER — BOOST / RESOURCE BREEZE PO LIQD
1.0000 | Freq: Two times a day (BID) | ORAL | Status: DC
  Administered 2013-08-16 – 2013-08-19 (×5): 1 via ORAL

## 2013-08-16 MED ORDER — OXYCODONE HCL 5 MG PO TABS
5.0000 mg | ORAL_TABLET | ORAL | Status: DC | PRN
Start: 2013-08-16 — End: 2013-08-19
  Administered 2013-08-18 – 2013-08-19 (×8): 5 mg via ORAL
  Filled 2013-08-16 (×9): qty 1

## 2013-08-16 MED ORDER — PHYTONADIONE 5 MG PO TABS
10.0000 mg | ORAL_TABLET | Freq: Once | ORAL | Status: AC
  Administered 2013-08-16: 10 mg via ORAL
  Filled 2013-08-16 (×3): qty 2

## 2013-08-16 MED ORDER — ENSURE COMPLETE PO LIQD
237.0000 mL | Freq: Two times a day (BID) | ORAL | Status: DC
  Administered 2013-08-16 – 2013-08-19 (×4): 237 mL via ORAL

## 2013-08-16 NOTE — Progress Notes (Signed)
PT Cancellation Note  Patient Details Name: Anna Farrell MRN: [id number] DOB: 1927-04-09   Cancelled Treatment:    Reason Eval/Treat Not Completed: Patient not medically ready.  Pt to OR today for L hip fx.  Please re-order post-op.  Thanks.     Adell Panek, Thornton Papas 08/16/2013, 7:52 AM

## 2013-08-16 NOTE — Progress Notes (Signed)
Utilization review completed.  

## 2013-08-16 NOTE — Progress Notes (Signed)
OT Cancellation Note  Patient Details Name: Anna Farrell MRN: [id number] DOB: 1927/08/23   Cancelled Treatment:    Reason Eval/Treat Not Completed: Patient not medically ready.Pt to OR today for L hip fx. Please re-order post-op. Thanks   Almon Register [phone] 08/16/2013, 8:18 AM

## 2013-08-16 NOTE — Progress Notes (Signed)
[facility] PROGRESS NOTE  Anna Farrell LKG:[id number] DOB: 1927-06-07 DOA: 08/15/2013 PCP: [name], MD  Assessment/Plan: Active Problems:   Left displaced femoral neck fracture   Hyperlipidemia    80 year old female with a history of hypertension, hyperlipidemia, permanent atrial fibrillation on warfarin, sick sinus syndrome, and asthma presents after a mechanical fall. The patient was at her beach house at [facility] and was taken to [facility]. She wanted to be transported to [facility]. The patient denies any syncope. She states she has been her usual state of health prior to mechanical fall. The patient was getting out of bed to go to the bathroom when she felt that she slipped and fell onto her left side. She had significant pain and could not get up. Left hip X-ray at [facility] revealed a left femoral neck fracture. Patient denies fevers, chills, headache, chest pain, dyspnea, nausea, vomiting, diarrhea, abdominal pain, dysuria, hematuria  In the ED at [facility], the patient's BMP and CBC were unremarkable. INR was 3.00. The patient was hemodynamically stable. The patient was given morphine 4 mg IV. EKG showed atrial fibrillation with nonspecific ST changes. Chest x-ray was negative for any acute changes.   Assessment/Plan:  Left subcapital hip fracture  -ortho--Dr. Percell Miller plans to operate on 08/17/13 as INR greater than 1.7 today -pain control  -PT/OT after surgery  Avoid Tylenol per patient request Discontinue Vicodin and switch to oxycodone Follow CBC and INR  Permanent Atrial Fibrillation  -rate controlled  -Continue metoprolol succinate  -Dr. Percell Miller has ordered for the patient to have vitamin K, 10mg  po , she was 5 mg in the ED -INR in am  -if still coagulopathic in am, FFP can be given   Hypertension  -Controlled  -Continue metoprolol succinate  -patient states that she is not taking amlodipine which was listed on her med rec   Sick sinus  syndrome  -s/p PPM  Hyperlipidemia  -Continue statin  Asthma  -continue QVar  Hx of vertebral compression fracture  -s/p kyphoplasty      Code Status: full Family Communication: family updated about patient's clinical progress Disposition Plan:  As above   Consultants:  Ed[facility]ia Lynch [facility]  Procedures:  None  Antibiotics:  None  HPI/Subjective: Pleasant complaining of back pain, states that she does not want Tylenol  Objective: Filed Vitals:   08/15/13 1645 08/15/13 1916 08/15/13 2029 08/16/13 0541  BP: 144/87 138/63  128/72  Pulse: 81 92  83  Temp:  98.9 F (37.2 C)  98 F (36.7 C)  TempSrc:  Oral  Oral  Resp:  18  18  Height:      Weight:  60.827 kg (134 lb 1.6 oz)    SpO2: 91% 98% 90% 99%    Intake/Output Summary (Last 24 hours) at 08/16/13 1033 Last data filed at 08/16/13 0543  Gross per 24 hour  Intake    240 ml  Output    900 ml  Net   -660 ml    Exam:  General: alert & oriented x 3 In NAD  Cardiovascular: RRR, nl S1 s2  Respiratory: Decreased breath sounds at the bases, scattered rhonchi, no crackles  Abdomen: soft +BS NT/ND, no masses palpable  Extremities: No cyanosis and no edema      Data Reviewed: Basic Metabolic Panel:  Recent Labs Lab 08/15/13 1603  NA 141  K 4.1  CL 103  CO2 28  GLUCOSE 124*  BUN 9  CREATININE 0.63  CALCIUM 9.0  Liver Function Tests: No results found for this basename: AST, ALT, ALKPHOS, BILITOT, PROT, ALBUMIN,  in the last 168 hours No results found for this basename: LIPASE, AMYLASE,  in the last 168 hours No results found for this basename: AMMONIA,  in the last 168 hours  CBC:  Recent Labs Lab 08/15/13 1603 08/16/13 0537  WBC 6.4 6.4  NEUTROABS 4.8  --   HGB 13.7 13.2  HCT 42.7 42.0  MCV 89.0 88.1  PLT 198 180    Cardiac Enzymes: No results found for this basename: CKTOTAL, CKMB, CKMBINDEX, TROPONINI,  in the last 168 hours BNP (last 3 results) No results found for this  basename: PROBNP,  in the last 8760 hours   CBG: No results found for this basename: GLUCAP,  in the last 168 hours  Recent Results (from the past 240 hour(s))  MRSA PCR SCREENING     Status: None   Collection Time    08/16/13  6:49 AM      Result Value Ref Range Status   MRSA by PCR NEGATIVE  NEGATIVE Final   Comment:            The GeneXpert MRSA Assay (FDA     approved for NASAL specimens     only), is one component of a     comprehensive MRSA colonization     surveillance program. It is not     intended to diagnose MRSA     infection nor to guide or     monitor treatment for     MRSA infections.     Studies: Dg Chest 1 View  08/15/2013   CLINICAL DATA:  80 year old female with hip fracture. Preoperative respiratory examination. Fall with left-sided pain.  EXAM: CHEST - 1 VIEW  COMPARISON:  04/06/2013  FINDINGS: Cardiomegaly and left-sided pacemaker again noted.  There is no evidence of focal airspace disease, pulmonary edema, suspicious pulmonary nodule/mass, pleural effusion, or pneumothorax. No acute bony abnormalities are identified.  IMPRESSION: Cardiomegaly without evidence of active cardiopulmonary disease.   Electronically Signed   By: Hassan Rowan M.D.   On: 08/15/2013 15:56   Dg Hip Complete Left  08/15/2013   CLINICAL DATA:  Hip pain.  Fell.  EXAM: LEFT HIP - COMPLETE 2+ VIEW  COMPARISON:  None.  FINDINGS: There is an impacted subcapital LEFT hip fracture. Osteopenia is noted. No pelvic fractures are seen. There is moderate stool burden. Buttock granuloma.  IMPRESSION: Impacted subcapital LEFT hip fracture.   Electronically Signed   By: Rolla Flatten M.D.   On: 08/15/2013 15:56    Scheduled Meds: . cholecalciferol  2,000 Units Oral Daily  . feeding supplement (ENSURE COMPLETE)  237 mL Oral BID BM  . feeding supplement (RESOURCE BREEZE)  1 Container Oral BID  . fluticasone  2 puff Inhalation BID  . metoprolol succinate  25 mg Oral BID  . phytonadione  10 mg Oral Once  .  simvastatin  20 mg Oral q1800  . vitamin B-12  100 mcg Oral Weekly   Continuous Infusions: . sodium chloride 1,000 mL (08/15/13 2021)    Active Problems:   Left displaced femoral neck fracture   Hyperlipidemia    Time spent: 40 minutes   [facility] Pager [phone]. If 8PM-8AM, please contact night-coverage at [email or web address], password [facility] 08/16/2013, 10:33 AM  LOS: 1 day

## 2013-08-16 NOTE — Progress Notes (Signed)
INITIAL NUTRITION ASSESSMENT  DOCUMENTATION CODES Per approved criteria  -Not Applicable   INTERVENTION: Provide Ensure Complete BID Provide Resource Breeze BID Encourage PO intake  NUTRITION DIAGNOSIS: Inadequate oral intake related to poor appetite as evidenced by <25% meal completion and pt's report of 30 lb weight loss.   Goal: Pt to meet >/= 90% of their estimated nutrition needs   Monitor:  PO intake, weight trend, labs, supplement acceptance  Reason for Assessment: Consult and Malnutrition Screening Tool  80 y.o. female  Admitting Dx: <principal problem not specified>  ASSESSMENT: 80 year old female with a history of hypertension, hyperlipidemia, permanent atrial fibrillation on warfarin, sick sinus syndrome, and asthma presents after a mechanical fall. The patient was at her [place] house at [facility] and was taken to [facility]. She wanted to be transported to [facility]. Left hip X-ray at [facility] revealed a left femoral neck fracture.   Pt reports that for the past several months she has had a very poor appetite. She reports that she has only been eating a few bites at each meal causing her to lose 30 lbs. She is unable to report how much she usually weighs. Breakfast tray at bedside- pt had consumed about 20% of meal. Per weight history below pt has lost 10 lbs in the past 4 months- 7% weight loss, not significant for time frame. Pt agreeable to trying Ensure and Resource Breeze supplements.   Nutrition Focused Physical Exam:  Subcutaneous Fat:  Orbital Region: WNL Upper Arm Region: moderate wasting Thoracic and Lumbar Region: mild wasting  Muscle:  Temple Region: wnl Clavicle Bone Region: mild wasting Clavicle and Acromion Bone Region: wnl Scapular Bone Region: NA Dorsal Hand: mild wasting Patellar Region: wnl Anterior Thigh Region: wnl Posterior Calf Region: wnl  Edema: none    Height: Ht Readings from Last 1 Encounters:  08/15/13  5\' 2"  (1.575 m)    Weight: Wt Readings from Last 1 Encounters:  08/15/13 134 lb 1.6 oz (60.827 kg)    Ideal Body Weight: 110 lbs  % Ideal Body Weight: 122%  Wt Readings from Last 10 Encounters:  08/15/13 134 lb 1.6 oz (60.827 kg)  04/28/13 136 lb (61.689 kg)  04/27/13 132 lb (59.875 kg)  04/16/13 136 lb (61.689 kg)  04/08/13 144 lb 13.5 oz (65.7 kg)  04/08/13 144 lb 13.5 oz (65.7 kg)  02/26/13 140 lb (63.504 kg)  12/17/12 137 lb (62.143 kg)  10/18/11 136 lb (61.689 kg)  09/17/11 138 lb (62.596 kg)    Usual Body Weight: unknown  % Usual Body Weight: NA  BMI:  Body mass index is 24.52 kg/(m^2).  Estimated Nutritional Needs: Kcal: 1500-1700 Protein: 70-80 grams Fluid: 1.5-1.7 L/day  Skin: intact  Diet Order: Cardiac  EDUCATION NEEDS: -No education needs identified at this time   Intake/Output Summary (Last 24 hours) at 08/16/13 1014 Last data filed at 08/16/13 0543  Gross per 24 hour  Intake    240 ml  Output    900 ml  Net   -660 ml    Last BM: 8/7  Labs:   Recent Labs Lab 08/15/13 1603  NA 141  K 4.1  CL 103  CO2 28  BUN 9  CREATININE 0.63  CALCIUM 9.0  GLUCOSE 124*    CBG (last 3)  No results found for this basename: GLUCAP,  in the last 72 hours  Scheduled Meds: . cholecalciferol  2,000 Units Oral Daily  . fluticasone  2 puff Inhalation BID  .  metoprolol succinate  25 mg Oral BID  . phytonadione  10 mg Oral Once  . simvastatin  20 mg Oral q1800  . vitamin B-12  100 mcg Oral Weekly    Continuous Infusions: . sodium chloride 1,000 mL (08/15/13 2021)    Past Medical History  Diagnosis Date  . Vasovagal syncope   . Hypertension   . Hyperlipidemia   . Orthostatic hypotension   . Osteoporosis   . Pacemaker 10/11/06    implanted by Dr Leonia Reeves for pauses > 4 seconds with afib  . Iron deficiency anemia   . Shortness of breath   . Arthritis   . Colitis 5/13  . Chronic low back pain 2008    crushed vertebrae  .  Tachycardia-bradycardia syndrome   . Permanent atrial fibrillation   . Asymptomatic PVCs   . History of colonic polyps   . Pure hypercholesterolemia   . Extrinsic asthma, unspecified     LOV Dr Joya Gaskins 7/12  . Long term (current) use of anticoagulants   . Cholelithiasis   . Generalized osteoarthrosis, unspecified site     Past Surgical History  Procedure Laterality Date  . Vesicovaginal fistula closure w/ tah  1981  . Pacemaker insertion  10/11/2006    SJM Zephyr XL DR implanted by Dr Leonia Reeves for afib with pause > 4 seconds  . Colonoscopy    . Appendectomy    . Abdominal hysterectomy    . Cholecystectomy  09/17/2011    Procedure: LAPAROSCOPIC CHOLECYSTECTOMY;  Surgeon: Rolm Bookbinder, MD;  Location: [facility];  Service: General;;  . Cardiac catheterization  2009    Normal coronary arteries  . Ercp N/A 04/07/2013    Procedure: ENDOSCOPIC RETROGRADE CHOLANGIOPANCREATOGRAPHY (ERCP);  Surgeon: Jeryl Columbia, MD;  Location: [facility];  Service: Endoscopy;  Laterality: N/A;  . Eus  04/07/2013    Procedure: UPPER ENDOSCOPIC ULTRASOUND (EUS) RADIAL;  Surgeon: Jeryl Columbia, MD;  Location: [facility];  Service: Endoscopy;;    Pryor Ochoa RD, LDN Inpatient Clinical Dietitian Pager: [phone] After Hours Pager: [phone]

## 2013-08-17 ENCOUNTER — Encounter (HOSPITAL_COMMUNITY): Payer: Self-pay | Admitting: Anesthesiology

## 2013-08-17 ENCOUNTER — Encounter (HOSPITAL_COMMUNITY): Payer: Medicare Other | Admitting: Anesthesiology

## 2013-08-17 ENCOUNTER — Inpatient Hospital Stay (HOSPITAL_COMMUNITY): Payer: Medicare Other

## 2013-08-17 ENCOUNTER — Inpatient Hospital Stay (HOSPITAL_COMMUNITY): Payer: Medicare Other | Admitting: Anesthesiology

## 2013-08-17 ENCOUNTER — Encounter (HOSPITAL_COMMUNITY)
Admission: EM | Disposition: A | Discharge status: Rehabilitation Facility | Payer: Self-pay | Source: Home / Self Care | Attending: Internal Medicine

## 2013-08-17 HISTORY — PX: HIP ARTHROPLASTY: SHX981

## 2013-08-17 LAB — PROTIME-INR
INR: 1.33 (ref 0.00–1.49)
Prothrombin Time: 16.5 seconds — ABNORMAL HIGH (ref 11.6–15.2)

## 2013-08-17 SURGERY — HEMIARTHROPLASTY, HIP, DIRECT ANTERIOR APPROACH, FOR FRACTURE
Anesthesia: General | Laterality: Left

## 2013-08-17 MED ORDER — CEFAZOLIN SODIUM-DEXTROSE 2-3 GM-% IV SOLR
2.0000 g | Freq: Four times a day (QID) | INTRAVENOUS | Status: AC
  Administered 2013-08-17 (×2): 2 g via INTRAVENOUS
  Filled 2013-08-17 (×3): qty 50

## 2013-08-17 MED ORDER — ONDANSETRON HCL 4 MG/2ML IJ SOLN
INTRAMUSCULAR | Status: AC
  Filled 2013-08-17: qty 2

## 2013-08-17 MED ORDER — METOCLOPRAMIDE HCL 5 MG/ML IJ SOLN: 5.0000 mg | Freq: Three times a day (TID) | INTRAMUSCULAR | Status: DC | PRN

## 2013-08-17 MED ORDER — NEOSTIGMINE METHYLSULFATE 10 MG/10ML IV SOLN
INTRAVENOUS | Status: DC | PRN
  Administered 2013-08-17: 3 mg via INTRAVENOUS

## 2013-08-17 MED ORDER — HYDROCODONE-ACETAMINOPHEN 5-325 MG PO TABS: 1.0000 | ORAL_TABLET | ORAL | Status: DC | PRN

## 2013-08-17 MED ORDER — PROPOFOL 10 MG/ML IV BOLUS
INTRAVENOUS | Status: AC
  Filled 2013-08-17: qty 20

## 2013-08-17 MED ORDER — ONDANSETRON HCL 4 MG PO TABS: 4.0000 mg | ORAL_TABLET | Freq: Four times a day (QID) | ORAL | Status: DC | PRN

## 2013-08-17 MED ORDER — PROPOFOL 10 MG/ML IV BOLUS
INTRAVENOUS | Status: DC | PRN
  Administered 2013-08-17: 80 mg via INTRAVENOUS

## 2013-08-17 MED ORDER — MENTHOL 3 MG MT LOZG: 1.0000 | LOZENGE | OROMUCOSAL | Status: DC | PRN

## 2013-08-17 MED ORDER — ACETAMINOPHEN 650 MG RE SUPP: 650.0000 mg | Freq: Four times a day (QID) | RECTAL | Status: DC | PRN

## 2013-08-17 MED ORDER — OXYMETAZOLINE HCL 0.05 % NA SOLN
2.0000 | Freq: Once | NASAL | Status: DC
  Filled 2013-08-17: qty 15

## 2013-08-17 MED ORDER — HEPARIN (PORCINE) IN NACL 100-0.45 UNIT/ML-% IJ SOLN
950.0000 [IU]/h | INTRAMUSCULAR | Status: DC
  Administered 2013-08-17: 800 [IU]/h via INTRAVENOUS
  Filled 2013-08-17: qty 250

## 2013-08-17 MED ORDER — FENTANYL CITRATE 0.05 MG/ML IJ SOLN
INTRAMUSCULAR | Status: AC
  Filled 2013-08-17: qty 2

## 2013-08-17 MED ORDER — LIDOCAINE HCL (CARDIAC) 20 MG/ML IV SOLN
INTRAVENOUS | Status: DC | PRN
  Administered 2013-08-17: 60 mg via INTRAVENOUS

## 2013-08-17 MED ORDER — LIDOCAINE HCL (CARDIAC) 20 MG/ML IV SOLN
INTRAVENOUS | Status: AC
  Filled 2013-08-17: qty 5

## 2013-08-17 MED ORDER — PROMETHAZINE HCL 25 MG/ML IJ SOLN: 6.2500 mg | INTRAMUSCULAR | Status: DC | PRN

## 2013-08-17 MED ORDER — GLYCOPYRROLATE 0.2 MG/ML IJ SOLN
INTRAMUSCULAR | Status: DC | PRN
  Administered 2013-08-17: 0.4 mg via INTRAVENOUS

## 2013-08-17 MED ORDER — FENTANYL CITRATE 0.05 MG/ML IJ SOLN
INTRAMUSCULAR | Status: DC | PRN
  Administered 2013-08-17 (×4): 50 ug via INTRAVENOUS

## 2013-08-17 MED ORDER — ONDANSETRON HCL 4 MG/2ML IJ SOLN
INTRAMUSCULAR | Status: DC | PRN
  Administered 2013-08-17: 4 mg via INTRAVENOUS

## 2013-08-17 MED ORDER — WARFARIN SODIUM 3 MG PO TABS: 3.0000 mg | ORAL_TABLET | Freq: Every day | ORAL | Status: DC

## 2013-08-17 MED ORDER — LIDOCAINE-PRILOCAINE 2.5-2.5 % EX CREA
1.0000 "application " | TOPICAL_CREAM | Freq: Once | CUTANEOUS | Status: DC
  Filled 2013-08-17: qty 5

## 2013-08-17 MED ORDER — PHENOL 1.4 % MT LIQD: 1.0000 | OROMUCOSAL | Status: DC | PRN

## 2013-08-17 MED ORDER — WARFARIN SODIUM 4 MG PO TABS
4.5000 mg | ORAL_TABLET | Freq: Once | ORAL | Status: AC
  Administered 2013-08-17: 4.5 mg via ORAL
  Filled 2013-08-17: qty 1

## 2013-08-17 MED ORDER — PHENYLEPHRINE 40 MCG/ML (10ML) SYRINGE FOR IV PUSH (FOR BLOOD PRESSURE SUPPORT)
PREFILLED_SYRINGE | INTRAVENOUS | Status: AC
  Filled 2013-08-17: qty 10

## 2013-08-17 MED ORDER — FENTANYL CITRATE 0.05 MG/ML IJ SOLN
INTRAMUSCULAR | Status: AC
  Filled 2013-08-17: qty 5

## 2013-08-17 MED ORDER — ROCURONIUM BROMIDE 100 MG/10ML IV SOLN
INTRAVENOUS | Status: DC | PRN
  Administered 2013-08-17: 35 mg via INTRAVENOUS

## 2013-08-17 MED ORDER — CEFAZOLIN SODIUM-DEXTROSE 2-3 GM-% IV SOLR
INTRAVENOUS | Status: AC
  Filled 2013-08-17: qty 50

## 2013-08-17 MED ORDER — PHENYLEPHRINE HCL 10 MG/ML IJ SOLN
INTRAMUSCULAR | Status: DC | PRN
  Administered 2013-08-17 (×4): 80 ug via INTRAVENOUS

## 2013-08-17 MED ORDER — ACETAMINOPHEN 325 MG PO TABS
650.0000 mg | ORAL_TABLET | Freq: Four times a day (QID) | ORAL | Status: DC | PRN
  Administered 2013-08-19: 650 mg via ORAL
  Filled 2013-08-17: qty 2

## 2013-08-17 MED ORDER — DOCUSATE SODIUM 100 MG PO CAPS: 100.0000 mg | ORAL_CAPSULE | Freq: Two times a day (BID) | ORAL | Status: DC

## 2013-08-17 MED ORDER — WARFARIN - PHARMACIST DOSING INPATIENT: Freq: Every day | Status: DC

## 2013-08-17 MED ORDER — ALBUMIN HUMAN 5 % IV SOLN
INTRAVENOUS | Status: DC | PRN
  Administered 2013-08-17: 08:00:00 via INTRAVENOUS

## 2013-08-17 MED ORDER — SODIUM CHLORIDE 0.9 % IJ SOLN
INTRAMUSCULAR | Status: AC
  Filled 2013-08-17: qty 10

## 2013-08-17 MED ORDER — ONDANSETRON HCL 4 MG/2ML IJ SOLN: 4.0000 mg | Freq: Four times a day (QID) | INTRAMUSCULAR | Status: DC | PRN

## 2013-08-17 MED ORDER — EPHEDRINE SULFATE 50 MG/ML IJ SOLN
INTRAMUSCULAR | Status: AC
  Filled 2013-08-17: qty 1

## 2013-08-17 MED ORDER — CEFAZOLIN SODIUM-DEXTROSE 2-3 GM-% IV SOLR
INTRAVENOUS | Status: DC | PRN
  Administered 2013-08-17: 2 g via INTRAVENOUS

## 2013-08-17 MED ORDER — HYDROCODONE-ACETAMINOPHEN 5-325 MG PO TABS
1.0000 | ORAL_TABLET | Freq: Four times a day (QID) | ORAL | Status: DC | PRN
  Administered 2013-08-17 – 2013-08-19 (×3): 2 via ORAL
  Filled 2013-08-17 (×3): qty 2

## 2013-08-17 MED ORDER — HYDROMORPHONE HCL PF 1 MG/ML IJ SOLN
INTRAMUSCULAR | Status: AC
  Filled 2013-08-17: qty 1

## 2013-08-17 MED ORDER — METOCLOPRAMIDE HCL 10 MG PO TABS: 5.0000 mg | ORAL_TABLET | Freq: Three times a day (TID) | ORAL | Status: DC | PRN

## 2013-08-17 MED ORDER — SODIUM CHLORIDE 0.9 % IV SOLN
INTRAVENOUS | Status: DC | PRN
  Administered 2013-08-17: 07:00:00 via INTRAVENOUS

## 2013-08-17 MED ORDER — FENTANYL CITRATE 0.05 MG/ML IJ SOLN
25.0000 ug | INTRAMUSCULAR | Status: DC | PRN
  Administered 2013-08-17 (×3): 50 ug via INTRAVENOUS

## 2013-08-17 MED ORDER — ROCURONIUM BROMIDE 50 MG/5ML IV SOLN
INTRAVENOUS | Status: AC
  Filled 2013-08-17: qty 1

## 2013-08-17 SURGICAL SUPPLY — 42 items
BLADE SAGITTAL 25.0X1.27X90 (BLADE) ×2 IMPLANT
COVER BACK TABLE 24X17X13 BIG (DRAPES) IMPLANT
DRAPE ORTHO SPLIT 77X108 STRL (DRAPES) ×2
DRAPE SURG ORHT 6 SPLT 77X108 (DRAPES) ×2 IMPLANT
DRAPE U-SHAPE 47X51 STRL (DRAPES) ×2 IMPLANT
DRILL BIT 5/64 (BIT) ×2 IMPLANT
DRSG AQUACEL AG ADV 3.5X10 (GAUZE/BANDAGES/DRESSINGS) ×2 IMPLANT
DRSG MEPILEX BORDER 4X8 (GAUZE/BANDAGES/DRESSINGS) ×2 IMPLANT
DURAPREP 26ML APPLICATOR (WOUND CARE) ×2 IMPLANT
ELECT CAUTERY BLADE 6.4 (BLADE) ×2 IMPLANT
ELECT REM PT RETURN 9FT ADLT (ELECTROSURGICAL) ×2
ELECTRODE REM PT RTRN 9FT ADLT (ELECTROSURGICAL) ×1 IMPLANT
FACESHIELD WRAPAROUND (MASK) ×2 IMPLANT
GLOVE BIO SURGEON STRL SZ7.5 (GLOVE) ×4 IMPLANT
GLOVE BIOGEL PI IND STRL 8 (GLOVE) ×1 IMPLANT
GLOVE BIOGEL PI INDICATOR 8 (GLOVE) ×1
GOWN STRL REUS W/ TWL LRG LVL3 (GOWN DISPOSABLE) ×1 IMPLANT
GOWN STRL REUS W/ TWL XL LVL3 (GOWN DISPOSABLE) ×1 IMPLANT
GOWN STRL REUS W/TWL LRG LVL3 (GOWN DISPOSABLE) ×1
GOWN STRL REUS W/TWL XL LVL3 (GOWN DISPOSABLE) ×1
HANDPIECE INTERPULSE COAX TIP (DISPOSABLE) ×1
HIP HEMIARTHROPLASTY LEV 1A ×2 IMPLANT
KIT BASIN OR (CUSTOM PROCEDURE TRAY) ×2 IMPLANT
KIT ROOM TURNOVER OR (KITS) ×2 IMPLANT
MANIFOLD NEPTUNE II (INSTRUMENTS) ×2 IMPLANT
NS IRRIG 1000ML POUR BTL (IV SOLUTION) ×2 IMPLANT
PACK TOTAL JOINT (CUSTOM PROCEDURE TRAY) ×2 IMPLANT
PAD ARMBOARD 7.5X6 YLW CONV (MISCELLANEOUS) ×4 IMPLANT
PILLOW ABDUCTION HIP (SOFTGOODS) ×2 IMPLANT
RETRIEVER SUT HEWSON (MISCELLANEOUS) ×2 IMPLANT
SET HNDPC FAN SPRY TIP SCT (DISPOSABLE) ×1 IMPLANT
SUT FIBERWIRE #2 38 REV NDL BL (SUTURE) ×4
SUT MNCRL AB 4-0 PS2 18 (SUTURE) ×2 IMPLANT
SUT MON AB 2-0 CT1 36 (SUTURE) ×2 IMPLANT
SUT VIC AB 0 CT1 27 (SUTURE) ×2
SUT VIC AB 0 CT1 27XBRD ANBCTR (SUTURE) ×1 IMPLANT
SUTURE FIBERWR#2 38 REV NDL BL (SUTURE) ×2 IMPLANT
TOWEL OR 17X24 6PK STRL BLUE (TOWEL DISPOSABLE) ×2 IMPLANT
TOWEL OR 17X26 10 PK STRL BLUE (TOWEL DISPOSABLE) ×2 IMPLANT
TOWEL OR NON WOVEN STRL DISP B (DISPOSABLE) ×2 IMPLANT
TRAY FOLEY CATH 14FR (SET/KITS/TRAYS/PACK) IMPLANT
WATER STERILE IRR 1000ML POUR (IV SOLUTION) ×4 IMPLANT

## 2013-08-17 NOTE — Progress Notes (Signed)
[facility] PROGRESS NOTE  Anna Farrell WVP:[id number] DOB: 08-31-1927 DOA: 08/15/2013 PCP: [name], MD  Assessment/Plan: Active Problems:   Left displaced femoral neck fracture   Hyperlipidemia   80 year old female with a history of hypertension, hyperlipidemia, permanent atrial fibrillation on warfarin, sick sinus syndrome, and asthma presents after a mechanical fall. The patient was at her beach house at [facility] and was taken to [facility]. She wanted to be transported to [facility]. The patient denies any syncope. She states she has been her usual state of health prior to mechanical fall. The patient was getting out of bed to go to the bathroom when she felt that she slipped and fell onto her left side. She had significant pain and could not get up. Left hip X-[facility] at [facility] revealed a left femoral neck fracture. Patient denies fevers, chills, headache, chest pain, dyspnea, nausea, vomiting, diarrhea, abdominal pain, dysuria, hematuria  In the ED at [facility], the patient's BMP and CBC were unremarkable. INR was 3.00. The patient was hemodynamically stable. The patient was given morphine 4 mg IV. EKG showed atrial fibrillation with nonspecific ST changes. Chest x-ray was negative for any acute changes.   Assessment/Plan:  Left subcapital hip fracture  Dr. Percell Miller consulted -status post bipolar hip arthroplasty -pain control  -PT/OT after surgery  Avoid Tylenol per patient request  Discontinue Vicodin and switch to oxycodone  Follow CBC and INR  Start heparin drip at 3 PM per Dr. Syliva Overman to resume Coumadin tonight  Permanent Atrial Fibrillation  -rate controlled  -Continue metoprolol succinate  Patient received a total of 15 mg of vitamin K by mouth in preparation for surgery  She may take a while to become therapeutic Would recommend switch to Lovenox tomorrow and bridge until therapeutic on her INR   Hypertension  -Controlled  -Continue  metoprolol succinate  -patient states that she is not taking amlodipine which was listed on her med rec   Sick sinus syndrome  -s/p PPM   Hyperlipidemia  -Continue statin  Asthma  -continue QVar  Hx of vertebral compression fracture  -s/p kyphoplasty    Code Status: full  Family Communication: family updated about patient's clinical progress  Disposition Plan: Likely DC to SNF in the next one to 2 days  Consultants:  Edmonia Lynch orthopedics Procedures:  None Antibiotics:  None    HPI/Subjective: Status post surgery Hemodynamically stable  Objective: Filed Vitals:   08/17/13 0945 08/17/13 0950 08/17/13 0952 08/17/13 1000  BP:   134/75   Pulse: 94 86 79 84  Temp:      TempSrc:      Resp: 19 19 16 17   Height:      Weight:      SpO2: 96% 98% 98% 100%    Intake/Output Summary (Last 24 hours) at 08/17/13 1024 Last data filed at 08/17/13 0853  Gross per 24 hour  Intake   2490 ml  Output   4100 ml  Net  -1610 ml    Exam:  General: alert & oriented x 3 In NAD  Cardiovascular: RRR, nl S1 s2  Respiratory: Decreased breath sounds at the bases, scattered rhonchi, no crackles  Abdomen: soft +BS NT/ND, no masses palpable  Extremities: No cyanosis and no edema      Data Reviewed: Basic Metabolic Panel:  Recent Labs Lab 08/15/13 1603  NA 141  K 4.1  CL 103  CO2 28  GLUCOSE 124*  BUN 9  CREATININE 0.63  CALCIUM  9.0    Liver Function Tests: No results found for this basename: AST, ALT, ALKPHOS, BILITOT, PROT, ALBUMIN,  in the last 168 hours No results found for this basename: LIPASE, AMYLASE,  in the last 168 hours No results found for this basename: AMMONIA,  in the last 168 hours  CBC:  Recent Labs Lab 08/15/13 1603 08/16/13 0537  WBC 6.4 6.4  NEUTROABS 4.8  --   HGB 13.7 13.2  HCT 42.7 42.0  MCV 89.0 88.1  PLT 198 180    Cardiac Enzymes: No results found for this basename: CKTOTAL, CKMB, CKMBINDEX, TROPONINI,  in the last 168  hours BNP (last 3 results) No results found for this basename: PROBNP,  in the last 8760 hours   CBG: No results found for this basename: GLUCAP,  in the last 168 hours  Recent Results (from the past 240 hour(s))  MRSA PCR SCREENING     Status: None   Collection Time    08/16/13  6:49 AM      Result Value Ref Range Status   MRSA by PCR NEGATIVE  NEGATIVE Final   Comment:            The GeneXpert MRSA Assay (FDA     approved for NASAL specimens     only), is one component of a     comprehensive MRSA colonization     surveillance program. It is not     intended to diagnose MRSA     infection nor to guide or     monitor treatment for     MRSA infections.     Studies: Dg Chest 1 View  08/15/2013   CLINICAL DATA:  80 year old female with hip fracture. Preoperative respiratory examination. Fall with left-sided pain.  EXAM: CHEST - 1 VIEW  COMPARISON:  04/06/2013  FINDINGS: Cardiomegaly and left-sided pacemaker again noted.  There is no evidence of focal airspace disease, pulmonary edema, suspicious pulmonary nodule/mass, pleural effusion, or pneumothorax. No acute bony abnormalities are identified.  IMPRESSION: Cardiomegaly without evidence of active cardiopulmonary disease.   Electronically Signed   By: Hassan Rowan M.D.   On: 08/15/2013 15:56   Dg Hip Complete Left  08/15/2013   CLINICAL DATA:  Hip pain.  Fell.  EXAM: LEFT HIP - COMPLETE 2+ VIEW  COMPARISON:  None.  FINDINGS: There is an impacted subcapital LEFT hip fracture. Osteopenia is noted. No pelvic fractures are seen. There is moderate stool burden. Buttock granuloma.  IMPRESSION: Impacted subcapital LEFT hip fracture.   Electronically Signed   By: Rolla Flatten M.D.   On: 08/15/2013 15:56   Dg Pelvis Portable  08/17/2013   CLINICAL DATA:  Postop left total hip arthroplasty.  EXAM: PORTABLE PELVIS 1-2 VIEWS  COMPARISON:  CT 04/06/2013  FINDINGS: Exam demonstrates mild diffuse decreased bone mineralization. There are mild degenerative  changes of the right hip. There is evidence of patient's left total hip arthroplasty which appears adequately located and intact. Remainder of the exam is unchanged.  IMPRESSION: Recent left total hip arthroplasty intact.  Mild degenerative change right hip.   Electronically Signed   By: Marin Olp M.D.   On: 08/17/2013 09:43    Scheduled Meds: . Mikaela.Ping HOLD] cholecalciferol  2,000 Units Oral Daily  . [MAR HOLD] feeding supplement (ENSURE COMPLETE)  237 mL Oral BID BM  . [MAR HOLD] feeding supplement (RESOURCE BREEZE)  1 Container Oral BID  . fentaNYL      . fentaNYL      . [  MAR HOLD] fluticasone  2 puff Inhalation BID  . [facility] HOLD] metoprolol succinate  25 mg Oral BID  . [facility] HOLD] simvastatin  20 mg Oral q1800  . [facility] HOLD] vitamin B-12  100 mcg Oral Weekly   Continuous Infusions: . sodium chloride 1,000 mL (08/17/13 0458)    Active Problems:   Left displaced femoral neck fracture   Hyperlipidemia    Time spent: 40 minutes   [facility] Pager [phone]. If 8PM-8AM, please contact night-coverage at [email or web address], password [facility] 08/17/2013, 10:24 AM  LOS: 2 days

## 2013-08-17 NOTE — Progress Notes (Signed)
ANTICOAGULATION CONSULT NOTE - Initial Consult  Pharmacy Consult for Heparin bridge to Coumadin Indication: atrial fibrillation  Allergies  Allergen Reactions  . Codeine Nausea And Vomiting    Passed out    Patient Measurements: Height: 5\' 2"  (157.5 cm) Weight: 134 lb 1.6 oz (60.827 kg) IBW/kg (Calculated) : 50.1  Vital Signs: Temp: 97.9 F (36.6 C) (08/11 1028) BP: 128/70 mmHg (08/11 1018) Pulse Rate: 77 (08/11 1028)  Labs:  Recent Labs  08/15/13 1603 08/16/13 0537 08/16/13 1700 08/17/13 0424  HGB 13.7 13.2  --   --   HCT 42.7 42.0  --   --   PLT 198 180  --   --   LABPROT 31.1* 28.3* 21.5* 16.5*  INR 3.00* 2.65* 1.87* 1.33  CREATININE 0.63  --   --   --     Estimated Creatinine Clearance: 45.8 ml/min (by C-G formula based on Cr of 0.63).   Medical History: Past Medical History  Diagnosis Date  . Vasovagal syncope   . Hypertension   . Hyperlipidemia   . Orthostatic hypotension   . Osteoporosis   . Pacemaker 10/11/06    implanted by Dr Leonia Reeves for pauses > 4 seconds with afib  . Iron deficiency anemia   . Shortness of breath   . Arthritis   . Colitis 5/13  . Chronic low back pain 2008    crushed vertebrae  . Tachycardia-bradycardia syndrome   . Permanent atrial fibrillation   . Asymptomatic PVCs   . History of colonic polyps   . Pure hypercholesterolemia   . Extrinsic asthma, unspecified     LOV Dr Joya Gaskins 7/12  . Long term (current) use of anticoagulants   . Cholelithiasis   . Generalized osteoarthrosis, unspecified site     Medications:  Prescriptions prior to admission  Medication Sig Dispense Refill  . beclomethasone (QVAR) 40 MCG/ACT inhaler Inhale 2 puffs into the lungs 2 (two) times daily as needed (breathing, shortness of breath).      . Cholecalciferol (VITAMIN D3) 1000 UNITS CAPS Take 2 capsules by mouth daily.       Marland Kitchen lidocaine (LIDODERM) 5 % Place 1 patch onto the skin daily.       . metoprolol succinate (TOPROL-XL) 25 MG 24 hr  tablet Take 25 mg by mouth 2 (two) times daily.      . pravastatin (PRAVACHOL) 40 MG tablet Take 1 tablet (40 mg total) by mouth daily. Once daily  90 tablet  1  . traMADol (ULTRAM) 50 MG tablet Take 50 mg by mouth every 8 (eight) hours as needed. As needed for pain. Two tabs in the morning (100 mg) two tabs in evening (100 mg ) total 200 mg daily      . TRAVATAN Z 0.004 % SOLN ophthalmic solution Place 1 drop into both eyes daily.      . vitamin B-12 (CYANOCOBALAMIN) 100 MCG tablet Take 100 mcg by mouth once a week.      . warfarin (COUMADIN) 3 MG tablet 1 tablet daily except 1.5 tablets on Thursdays or as directed by coumadin clinic  105 tablet  1  . nitroGLYCERIN (NITROSTAT) 0.4 MG SL tablet Place 1 tablet (0.4 mg total) under the tongue every 5 (five) minutes as needed for chest pain.  90 tablet  3    Assessment: 80 y.o. female presents with fall, L displaced femoral neck fracture. Pt s/p hip arthroplasty 8/11. Pt on coumadin PTA for afib. Reversed for surgery with total Vit K  dose 15mg  (given 8/9-8/10). INR down to 1.33 today. Plan to restart coumadin tonight - will likely take a while to overcome vit K. Heparin bridge to start today at 1500. CBC stable as of yesterday.  Home dose 3mg  daily except for 4.5mg  on Thur. INR on admit = 3   Goal of Therapy:  Heparin level 0.3-0.5 with recent surgery; INR 2-3 Monitor platelets by anticoagulation protocol: Yes   Plan:  1. At 1500 today, start Heparin IV gtt at 800 units/hr. No bolus. 2. Will f/u 8hr heparin level 3. Daily heparin level and CBC and INR 4. Coumadin 4.5mg  today   Sherlon Handing, PharmD, BCPS Clinical pharmacist, pager [phone] 08/17/2013,10:48 AM

## 2013-08-17 NOTE — Anesthesia Postprocedure Evaluation (Signed)
  Anesthesia Post-op Note  Patient: Anna Farrell  Procedure(s) Performed: Procedure(s): ARTHROPLASTY BIPOLAR HIP (Left)  Patient Location: PACU  Anesthesia Type:General  Level of Consciousness: awake and alert   Airway and Oxygen Therapy: Patient Spontanous Breathing  Post-op Pain: mild  Post-op Assessment: Post-op Vital signs reviewed and Patient's Cardiovascular Status Stable  Post-op Vital Signs: stable  Last Vitals:  Filed Vitals:   08/17/13 1040  BP: 122/68  Pulse: 85  Temp: 36.9 C  Resp: 15    Complications: No apparent anesthesia complications

## 2013-08-17 NOTE — Anesthesia Preprocedure Evaluation (Signed)
Anesthesia Evaluation  Patient identified by MRN, date of birth, ID band Patient awake    Reviewed: Allergy & Precautions, H&P , NPO status , Patient's Chart, lab work & pertinent test results  Airway Mallampati: I  Neck ROM: Full    Dental   Pulmonary shortness of breath,          Cardiovascular hypertension, Rhythm:Irregular Rate:Normal     Neuro/Psych negative neurological ROS     GI/Hepatic negative GI ROS, Neg liver ROS,   Endo/Other  negative endocrine ROS  Renal/GU negative Renal ROS     Musculoskeletal   Abdominal   Peds  Hematology   Anesthesia Other Findings   Reproductive/Obstetrics                           Anesthesia Physical Anesthesia Plan  ASA: III  Anesthesia Plan: General   Post-op Pain Management:    Induction: Intravenous  Airway Management Planned: Oral ETT  Additional Equipment:   Intra-op Plan:   Post-operative Plan: Extubation in OR  Informed Consent: I have reviewed the patients History and Physical, chart, labs and discussed the procedure including the risks, benefits and alternatives for the proposed anesthesia with the patient or authorized representative who has indicated his/her understanding and acceptance.   Dental advisory given  Plan Discussed with: CRNA and Surgeon  Anesthesia Plan Comments:         Anesthesia Quick Evaluation

## 2013-08-17 NOTE — Discharge Instructions (Signed)
Bear weight as tolerated  Follow posterior hip precautions  Resume your coumadin

## 2013-08-17 NOTE — Transfer of Care (Signed)
Immediate Anesthesia Transfer of Care Note  Patient: Anna Farrell  Procedure(s) Performed: Procedure(s): ARTHROPLASTY BIPOLAR HIP (Left)  Patient Location: PACU  Anesthesia Type:General  Level of Consciousness: awake, alert , oriented and patient cooperative  Airway & Oxygen Therapy: Patient Spontanous Breathing and Patient connected to nasal cannula oxygen  Post-op Assessment: Report given to PACU RN, Post -op Vital signs reviewed and stable and Patient moving all extremities  Post vital signs: Reviewed and stable  Complications: No apparent anesthesia complications

## 2013-08-17 NOTE — Op Note (Signed)
08/15/2013 - 08/17/2013  9:04 AM  PATIENT:  Anna Farrell   MRN: [id number]  PRE-OPERATIVE DIAGNOSIS:  left hip fracture  POST-OPERATIVE DIAGNOSIS:  same  PROCEDURE:  Procedure(s): ARTHROPLASTY BIPOLAR HIP  PREOPERATIVE INDICATIONS:  Anna Farrell is an 80 y.o. female who was admitted 08/15/2013 with a diagnosis of <principal problem not specified> and elected for surgical management.  The risks benefits and alternatives were discussed with the patient including but not limited to the risks of nonoperative treatment, versus surgical intervention including infection, bleeding, nerve injury, periprosthetic fracture, the need for revision surgery, dislocation, leg length discrepancy, blood clots, cardiopulmonary complications, morbidity, mortality, among others, and they were willing to proceed.  Predicted outcome is good, although there will be at least a six to nine month expected recovery.   OPERATIVE REPORT     SURGEON:  Edmonia Lynch, MD    ASSISTANT:  None    ANESTHESIA:  General    COMPLICATIONS:  None.      COMPONENTS:  [employer] Acolade: Femoral stem: 3, Femoral Head:44, Neck:0   PROCEDURE IN DETAIL: The patient was met in the holding area and identified.  The appropriate hip  was marked at the operative site. The patient was then transported to the OR and  placed under general anesthesia.  At that point, the patient was  placed in the lateral decubitus position with the operative side up and  secured to the operating room table and all bony prominences padded.     The operative lower extremity was prepped from the iliac crest to the toes.  Sterile draping was performed.  Time out was performed prior to incision.      A routine posterolateral approach was utilized via sharp dissection  carried down to the subcutaneous tissue.  Gross bleeders were Bovie  coagulated.  The iliotibial band was identified and incised  along the length of the skin incision.  Self-retaining retractors were   inserted.  With the hip internally rotated, the short external rotators  were identified. The piriformis was tagged with FiberWire, and the hip capsule released in a T-type fashion.  The femoral neck was exposed, and I resected the femoral neck using the appropriate jig. This was performed at approximately a thumb's breadth above the lesser trochanter.    I then exposed the deep acetabulum, cleared out any tissue including the ligamentum teres, and included the hip capsule in the FiberWire used above and below the T.    I then prepared the proximal femur using the cookie-cutter, the lateralizing reamer, and then sequentially broached.  A trial utilized, and I reduced the hip and it was found to have excellent stability with functional range of motion. The trial components were then removed.   The canal and acetabulum were thoroughly irrigated  I inserted the pressfit stem and placed the head and neck collar. The hip was reduced with appropriate force and was stable through a range of motion.   I then used a 2 mm drill bits to pass the FiberWire suture from the capsule and puriform is through the greater trochanter, and secured this. Excellent posterior capsular repair was achieved. I also closed the T in the capsule.  I then irrigated the hip copiously again with pulse lavage, and repaired the fascia with Vicryl, followed by Vicryl for the subcutaneous tissue, Monocryl for the skin, Steri-Strips and sterile gauze. The wounds were injected. The patient was then awakened and returned to PACU in stable and satisfactory condition. There were  no complications.  POST-OP PLAN: Weight bearing as tolerated. DVT px will consist of SCD's and Coumadin  Edmonia Lynch, MD Orthopedic Surgeon [phone]   08/17/2013 9:04 AM

## 2013-08-17 NOTE — Clinical Social Work Placement (Signed)
Clinical Social Work Department CLINICAL SOCIAL WORK PLACEMENT NOTE 08/17/2013  Patient:  Anna Farrell  Account Number:  [id number] Admit date:  08/16/2013  Clinical Social Worker:  Charlene Brooke, LCSW  Date/time:  08/17/2013 04:12 PM  Clinical Social Work is seeking post-discharge placement for this patient at the following level of care:   [place]   (*CSW will update this form in Epic as items are completed)   08/17/2013  Patient/family provided with [facility] Department of Clinical Social Work's list of facilities offering this level of care within the geographic area requested by the patient (or if unable, by the patient's family).  08/17/2013  Patient/family informed of their freedom to choose among providers that offer the needed level of care, that participate in Medicare, Medicaid or managed care program needed by the patient, have an available bed and are willing to accept the patient.  08/17/2013  Patient/family informed of [facility]' ownership interest in [facility], as well as of the fact that they are under no obligation to receive care at this facility.  PASARR submitted to EDS on 08/17/2013 PASARR number received on 08/17/2013  FL2 transmitted to all facilities in geographic area requested by pt/family on  08/17/2013 FL2 transmitted to all facilities within larger geographic area on 08/17/2013  Patient informed that his/her managed care company has contracts with or will negotiate with  certain facilities, including the following:     Patient/family informed of bed offers received:   Patient chooses bed at  Physician recommends and patient chooses bed at    Patient to be transferred to  on   Patient to be transferred to facility by  Patient and family notified of transfer on  Name of family member notified:    The following physician request were entered in Epic:   Additional Comments: Pt has not made a SNF choice yet, SW will give bed  offers on 08/18/13.  Charlene Brooke, MSW Clinical Social Worker [phone]

## 2013-08-18 ENCOUNTER — Inpatient Hospital Stay (HOSPITAL_COMMUNITY): Payer: Medicare Other

## 2013-08-18 ENCOUNTER — Encounter (HOSPITAL_COMMUNITY): Payer: Self-pay | Admitting: Orthopedic Surgery

## 2013-08-18 DIAGNOSIS — D62 Acute posthemorrhagic anemia: Secondary | ICD-10-CM

## 2013-08-18 DIAGNOSIS — J189 Pneumonia, unspecified organism: Secondary | ICD-10-CM

## 2013-08-18 DIAGNOSIS — E876 Hypokalemia: Secondary | ICD-10-CM

## 2013-08-18 DIAGNOSIS — S72009A Fracture of unspecified part of neck of unspecified femur, initial encounter for closed fracture: Secondary | ICD-10-CM

## 2013-08-18 LAB — COMPREHENSIVE METABOLIC PANEL
ALK PHOS: 75 U/L (ref 39–117)
ALT: 23 U/L (ref 0–35)
AST: 24 U/L (ref 0–37)
Albumin: 2.5 g/dL — ABNORMAL LOW (ref 3.5–5.2)
Anion gap: 11 (ref 5–15)
BUN: 4 mg/dL — ABNORMAL LOW (ref 6–23)
CO2: 29 mEq/L (ref 19–32)
Calcium: 8.5 mg/dL (ref 8.4–10.5)
Chloride: 101 mEq/L (ref 96–112)
Creatinine, Ser: 0.55 mg/dL (ref 0.50–1.10)
GFR calc non Af Amer: 84 mL/min — ABNORMAL LOW (ref >90)
GLUCOSE: 134 mg/dL — AB (ref 70–99)
POTASSIUM: 3.4 meq/L — AB (ref 3.7–5.3)
Sodium: 141 mEq/L (ref 137–147)
Total Bilirubin: 1 mg/dL (ref 0.3–1.2)
Total Protein: 5.4 g/dL — ABNORMAL LOW (ref 6.0–8.3)

## 2013-08-18 LAB — CBC
HEMATOCRIT: 35.5 % — AB (ref 36.0–46.0)
HEMOGLOBIN: 11.3 g/dL — AB (ref 12.0–15.0)
MCH: 28.4 pg (ref 26.0–34.0)
MCHC: 31.8 g/dL (ref 30.0–36.0)
MCV: 89.2 fL (ref 78.0–100.0)
PLATELETS: 152 10*3/uL (ref 150–400)
RBC: 3.98 MIL/uL (ref 3.87–5.11)
RDW: 14.8 % (ref 11.5–15.5)
WBC: 8.5 10*3/uL (ref 4.0–10.5)

## 2013-08-18 LAB — URINALYSIS, ROUTINE W REFLEX MICROSCOPIC
Bilirubin Urine: NEGATIVE
GLUCOSE, UA: NEGATIVE mg/dL
Ketones, ur: NEGATIVE mg/dL
Nitrite: NEGATIVE
PH: 6 (ref 5.0–8.0)
PROTEIN: 30 mg/dL — AB
SPECIFIC GRAVITY, URINE: 1.013 (ref 1.005–1.030)
Urobilinogen, UA: 1 mg/dL (ref 0.0–1.0)

## 2013-08-18 LAB — HEPARIN LEVEL (UNFRACTIONATED)
HEPARIN UNFRACTIONATED: 0.17 [IU]/mL — AB (ref 0.30–0.70)
Heparin Unfractionated: 0.29 IU/mL — ABNORMAL LOW (ref 0.30–0.70)

## 2013-08-18 LAB — URINE MICROSCOPIC-ADD ON

## 2013-08-18 LAB — PROTIME-INR
INR: 1.42 (ref 0.00–1.49)
Prothrombin Time: 17.4 seconds — ABNORMAL HIGH (ref 11.6–15.2)

## 2013-08-18 MED ORDER — LEVOFLOXACIN IN D5W 750 MG/150ML IV SOLN
750.0000 mg | INTRAVENOUS | Status: DC
  Administered 2013-08-18: 750 mg via INTRAVENOUS
  Filled 2013-08-18: qty 150

## 2013-08-18 MED ORDER — POLYETHYLENE GLYCOL 3350 17 G PO PACK
17.0000 g | PACK | Freq: Every day | ORAL | Status: DC
  Administered 2013-08-18 – 2013-08-19 (×2): 17 g via ORAL
  Filled 2013-08-18 (×3): qty 1

## 2013-08-18 MED ORDER — ENOXAPARIN SODIUM 40 MG/0.4ML ~~LOC~~ SOLN
40.0000 mg | Freq: Every day | SUBCUTANEOUS | Status: DC
  Filled 2013-08-18: qty 0.4

## 2013-08-18 MED ORDER — DOCUSATE SODIUM 100 MG PO CAPS
100.0000 mg | ORAL_CAPSULE | Freq: Two times a day (BID) | ORAL | Status: DC
  Administered 2013-08-18 – 2013-08-19 (×3): 100 mg via ORAL
  Filled 2013-08-18 (×3): qty 1

## 2013-08-18 MED ORDER — POTASSIUM CHLORIDE CRYS ER 20 MEQ PO TBCR
40.0000 meq | EXTENDED_RELEASE_TABLET | Freq: Once | ORAL | Status: AC
  Administered 2013-08-18: 40 meq via ORAL
  Filled 2013-08-18: qty 2

## 2013-08-18 MED ORDER — BISACODYL 10 MG RE SUPP
10.0000 mg | Freq: Once | RECTAL | Status: DC
  Filled 2013-08-18: qty 1

## 2013-08-18 MED ORDER — WARFARIN SODIUM 4 MG PO TABS
4.5000 mg | ORAL_TABLET | Freq: Once | ORAL | Status: AC
  Administered 2013-08-18: 4.5 mg via ORAL
  Filled 2013-08-18 (×2): qty 1

## 2013-08-18 NOTE — Progress Notes (Signed)
Rehab Admissions Coordinator Note:  Patient was screened by Cleatrice Burke for appropriateness for an Inpatient Acute Rehab Consult per PT recommendation.   At this time, we are recommending Inpatient Rehab consult. I will contact Dr. Sheran Fava.   Cleatrice Burke 08/18/2013, 10:05 AM  I can be reached at [phone].

## 2013-08-18 NOTE — Care Management Note (Signed)
CARE MANAGEMENT NOTE 08/18/2013  Patient:  Anna Farrell, Anna Farrell   Account Number:  [id number]  Date Initiated:  08/16/2013  Documentation initiated by:  Ricki Miller  Subjective/Objective Assessment:   80 yr old female admitted with left hip fracture, s/p Left hip hemiarthroplasty.     Action/Plan:   Patient will need shortterm rehab at [facility]. Family and patient want [employer] Social Worker is aware.   Anticipated DC Date:  08/19/2013   Anticipated DC Plan:  SKILLED NURSING FACILITY  In-house referral  Clinical Social Worker      DC Planning Services  CM consult      [facility] Choice  NA   Choice offered to / List presented to:  C-1 Patient   DME arranged  NA        [place] arranged  NA      Status of service:  Completed, signed off Medicare Important Message given?  YES (If response is "NO", the following Medicare IM given date fields will be blank) Date Medicare IM given:  08/18/2013 Medicare IM given by:  Ricki Miller Date Additional Medicare IM given:   Additional Medicare IM given by:    Discharge Disposition:  [place]  Per UR Regulation:  Reviewed for med. necessity/level of care/duration of stay

## 2013-08-18 NOTE — Progress Notes (Addendum)
[facility] PROGRESS NOTE  Anna Farrell DUK:[id number] DOB: 1927/12/03 DOA: 08/15/2013 PCP: [name], MD  Assessment/Plan  Left subcapital hip fracture s/p  bipolar hip arthroplasty by Dr. Percell Miller on 8/11. -  PT/OT after surgery  -  Avoid Tylenol per patient request  -  Cont prn oxycodone  -  Coumadin resumed on 8/11 -  Change to lovenox from heparin for DVT proph  Post-operative fever due to CAUTI -  Blood cultures x 2 -  UA:  Likely UTI -  CXR with bibasilar opacities c/w atelectasis, but she also has bilateral effusions -  D/c IVF -  No lasix due to low BP -  Start levofloxacin for possible pneumonia and UTI  Permanent Atrial Fibrillation, rate controlled  -  Continue metoprolol succinate  -  Patient received a total of 15 mg of vitamin K by mouth in preparation for surgery  -  Changed to lovenox DVT proph dosing for now - she has a moderate CHADs2Vasc score, no artificial heart valves or recent VTE and does not need full dose A/C bridging  Hypertension, BP low normal this AM -  Continue metoprolol succinate  -  Do not resume amlodipine - patient was not taking prior to admission  Sick sinus syndrome, s/p PPM  -  Tele:  Rate controlled a-fib, okay to d/c telemetry  Hyperlipidemia,s table, continue statin   Asthma, stable, continue QVar   OSA, resume CPAP  Hx of vertebral compression fracture s/p kyphoplasty   Hypokalemia, oral potassium repletion  Acute blood loss anemia post-operative, no need for blood transfusion adn no obvious bleeding -  Repeat CBC in AM -  tx for hgb < 7 or symptomatic anemia  Diet:  Regular with supplements Access:  PIV IVF:  off Proph:  lovenox + coumadin  Code Status: full Family Communication: patient alone Disposition Plan:  CIR to evaluate   Consultants:  T. Percell Miller, ortho  PM&R  Procedures:  arthoplasty bipolar hip 8/11  Antibiotics:  Levofloxacin 8/12 >>    HPI/Subjective:  Foley out, has not  attempted to void yet.  Constipated.  Eating.  Able to get to chair with assistance today with some pain in left hip, but otherwise feeling well.  Some cough, denies subjective fevers, chills.  Denies bladder discomfort.    Objective: Filed Vitals:   08/18/13 0536 08/18/13 0820 08/18/13 0845 08/18/13 0950  BP: 140/92  98/73 96/50  Pulse: 97   88  Temp: 98.9 F (37.2 C)  99 F (37.2 C) 98.3 F (36.8 C)  TempSrc:   Oral Oral  Resp: 18   18  Height:      Weight:      SpO2: 100% 96% 99% 95%    Intake/Output Summary (Last 24 hours) at 08/18/13 1019 Last data filed at 08/18/13 0631  Gross per 24 hour  Intake   1545 ml  Output   1925 ml  Net   -380 ml   Filed Weights   08/15/13 1452 08/15/13 1916  Weight: 61.689 kg (136 lb) 60.827 kg (134 lb 1.6 oz)    Exam:   General:  No acute distress  HEENT:  NCAT, MMM  Cardiovascular:  RRR, nl S1, S2 no mrg, 2+ pulses, warm extremities  Respiratory:  CTAB, no increased WOB  Abdomen:   NABS, soft, NT/ND  MSK:   Normal tone and bulk, no LEE  Neuro:  Grossly intact  Data Reviewed: Basic Metabolic Panel:  Recent Labs Lab 08/15/13 1603 08/18/13 3762  NA 141 141  K 4.1 3.4*  CL 103 101  CO2 28 29  GLUCOSE 124* 134*  BUN 9 4*  CREATININE 0.63 0.55  CALCIUM 9.0 8.5   Liver Function Tests:  Recent Labs Lab 08/18/13 0607  AST 24  ALT 23  ALKPHOS 75  BILITOT 1.0  PROT 5.4*  ALBUMIN 2.5*   No results found for this basename: LIPASE, AMYLASE,  in the last 168 hours No results found for this basename: AMMONIA,  in the last 168 hours CBC:  Recent Labs Lab 08/15/13 1603 08/16/13 0537 08/18/13 0607  WBC 6.4 6.4 8.5  NEUTROABS 4.8  --   --   HGB 13.7 13.2 11.3*  HCT 42.7 42.0 35.5*  MCV 89.0 88.1 89.2  PLT 198 180 152   Cardiac Enzymes: No results found for this basename: CKTOTAL, CKMB, CKMBINDEX, TROPONINI,  in the last 168 hours BNP (last 3 results) No results found for this basename: PROBNP,  in the last  8760 hours CBG: No results found for this basename: GLUCAP,  in the last 168 hours  Recent Results (from the past 240 hour(s))  MRSA PCR SCREENING     Status: None   Collection Time    08/16/13  6:49 AM      Result Value Ref Range Status   MRSA by PCR NEGATIVE  NEGATIVE Final   Comment:            The GeneXpert MRSA Assay (FDA     approved for NASAL specimens     only), is one component of a     comprehensive MRSA colonization     surveillance program. It is not     intended to diagnose MRSA     infection nor to guide or     monitor treatment for     MRSA infections.     Studies: Dg Pelvis Portable  08/17/2013   CLINICAL DATA:  Postop left total hip arthroplasty.  EXAM: PORTABLE PELVIS 1-2 VIEWS  COMPARISON:  CT 04/06/2013  FINDINGS: Exam demonstrates mild diffuse decreased bone mineralization. There are mild degenerative changes of the right hip. There is evidence of patient's left total hip arthroplasty which appears adequately located and intact. Remainder of the exam is unchanged.  IMPRESSION: Recent left total hip arthroplasty intact.  Mild degenerative change right hip.   Electronically Signed   By: Marin Olp M.D.   On: 08/17/2013 09:43   Dg Chest Port 1 View  08/18/2013   CLINICAL DATA:  Shortness of breath following recent left hip arthroplasty. Fever.  EXAM: PORTABLE CHEST - 1 VIEW  COMPARISON:  08/15/2013 and 04/06/2013.  FINDINGS: 0739 hr. Left subclavian AV sequential pacemaker leads are unchanged within the right atrium and right ventricle. There is stable mild cardiomegaly and aortic atherosclerosis. There are lower lung volumes with resulting patchy bibasilar opacities, likely atelectasis. In this clinical setting, early pneumonia cannot be excluded although there is no consolidation. There is no pleural effusion or pneumothorax. The osseous structures appear unchanged.  IMPRESSION: New patchy bibasilar opacities, likely atelectasis.   Electronically Signed   By: Camie Patience M.D.   On: 08/18/2013 08:28    Scheduled Meds: . bisacodyl  10 mg Rectal Once  . cholecalciferol  2,000 Units Oral Daily  . docusate sodium  100 mg Oral BID  . [START ON 08/19/2013] enoxaparin (LOVENOX) injection  40 mg Subcutaneous Daily  . feeding supplement (ENSURE COMPLETE)  237 mL Oral BID BM  . feeding  supplement (RESOURCE BREEZE)  1 Container Oral BID  . fluticasone  2 puff Inhalation BID  . metoprolol succinate  25 mg Oral BID  . polyethylene glycol  17 g Oral Daily  . potassium chloride  40 mEq Oral Once  . simvastatin  20 mg Oral q1800  . vitamin B-12  100 mcg Oral Weekly  . Warfarin - Pharmacist Dosing Inpatient   Does not apply q1800   Continuous Infusions:   Active Problems:   Left displaced femoral neck fracture   Hyperlipidemia   Hypokalemia   Acute blood loss anemia   CAP (community acquired pneumonia)    Time spent: 30 min    Braddock Servellon, [facility] Pager [phone]. If 7PM-7AM, please contact night-coverage at [email or web address], password [facility] 08/18/2013, 10:19 AM  LOS: 3 days

## 2013-08-18 NOTE — Progress Notes (Addendum)
I met with pt to discuss a possible inpt rehab admission. We discussed goals and therapy venue. I left a message for pt's son, Gerald Stabs, to contact me to arrange tour of our facilities. Gerald Stabs is [facility] this afternoon. I will follow up tomorrow and beds are available to admit tomorrow pending family and pt decision of rehab venue preference as well as medical readiness to d/c. Please call me with any questions. RN CM is aware. [phone] I met with pt's son and daughter and gave tout of inpt rehab unit. I will follow up tomorrow. [phone]

## 2013-08-18 NOTE — Progress Notes (Signed)
Occupational Therapy Evaluation Patient Details Name: Anna Farrell MRN: [id number] DOB: 1927/09/30 Today's Date: 08/18/2013    History of Present Illness Anna Farrell is an [name] admitted 08/15/13 after a fall sustaining Lt femur fx, now s/p THA on 08/16/13.    Clinical Impression   PTA pt lived at home alone and was independent with ADLs and functional mobility, although she did use a power scooter for long distance ambulation. Pt is highly motivated to return to independence, however does require encouragement to perform as much mobility on her own as she can. Pt requires +2 mod (A) for transfers at present and would benefit from skilled OT for bed mobility and toilet transfers. Pt would be a good candidate for CIR.     Follow Up Recommendations  CIR;Supervision/Assistance - 24 hour    Equipment Recommendations  3 in 1 bedside comode       Precautions / Restrictions Precautions Precautions: Posterior Hip;Fall Precaution Comments: Reviewed posterior precautions. Pt recalled 3/3 correctly. Restrictions Weight Bearing Restrictions: Yes LLE Weight Bearing: Weight bearing as tolerated      Mobility Bed Mobility Overal bed mobility: Needs Assistance Bed Mobility: Supine to Sit;Sit to Supine     Supine to sit: Mod assist;+2 for physical assistance Sit to supine: Mod assist;+2 for physical assistance   General bed mobility comments: cues for sequencing and technique.  Encouragement to try as much as she can.    Transfers Overall transfer level: Needs assistance Equipment used: Rolling walker (2 wheeled) Transfers: Sit to/from [facility] Sit to Stand: Mod assist;+2 physical assistance Stand pivot transfers: Mod assist;+2 physical assistance       General transfer comment: Pt with difficulty advancing RLE and encouraged to WBAT through LLE in order to swing RLE forward. VC's for UE use, standing up straight, positioning of LEs, and controlling descent  to sitting.     Balance Overall balance assessment: Needs assistance Sitting-balance support: No upper extremity supported;Feet supported Sitting balance-Leahy Scale: Fair     Standing balance support: Bilateral upper extremity supported;During functional activity Standing balance-Leahy Scale: Poor                              ADL Overall ADL's : Needs assistance/impaired Eating/Feeding: Independent;Sitting   Grooming: Set up;Supervision/safety;Sitting Grooming Details (indicate cue type and reason): sitting EOB Upper Body Bathing: Set up;Sitting   Lower Body Bathing: Moderate assistance;Sit to/from stand;+2 for physical assistance   Upper Body Dressing : Set up;Sitting   Lower Body Dressing: Maximal assistance;Sit to/from stand;+2 for physical assistance   Toilet Transfer: Moderate assistance;+2 for physical assistance   Toileting- Clothing Manipulation and Hygiene: Maximal assistance;+2 for physical assistance;Sit to/from stand   Tub/ Banker: Total assistance   Functional mobility during ADLs: Moderate assistance;+2 for physical assistance;Rolling walker General ADL Comments: Pt requires max encouragement to perform as much mobility as possible for herself. Pt performed bed mobility with assist and transferred to [facility] then returned to bed. Pt performed therapeutic exercise sitting EOB. Pt has difficulty with advancing RLE forward during ambulation due to her pain and hesitancy with WB through her LLE.     Vision  Pt reports no change from baseline.   No apparent visual deficits.                  Perception Perception Perception Tested?: No   Praxis Praxis Praxis tested?: Within functional limits    Pertinent  Vitals/Pain Pain Assessment: 0-10 Pain Score: 4  Pain Location: Lt hip Pain Descriptors / Indicators: Aching Pain Intervention(s): Monitored during session;Limited activity within patient's tolerance;Repositioned;Ice applied      Hand Dominance Right   Extremity/Trunk Assessment Upper Extremity Assessment Upper Extremity Assessment: Generalized weakness   Lower Extremity Assessment Lower Extremity Assessment: Defer to PT evaluation   Cervical / Trunk Assessment Cervical / Trunk Assessment: Kyphotic (pt reports neck pain)   Communication Communication Communication: No difficulties   Cognition Arousal/Alertness: Awake/alert Behavior During Therapy: WFL for tasks assessed/performed Overall Cognitive Status: Within Functional Limits for tasks assessed                        Exercises Exercises: Other exercises Other Exercises Other Exercises: Pt sat EOB and performed knee raises x 10 and foot kicks x 10 on RLE to increase strength.  Other Exercises: Encouraged pt to perform AROM of UE for increased strength throughout the day.         Home Living Family/patient expects to be discharged to:: Inpatient rehab Living Arrangements: Alone                                      Prior Functioning/Environment Level of Independence: Independent        Comments: 2x/week cleaning lady    OT Diagnosis: Generalized weakness;Acute pain   OT Problem List: Decreased strength;Decreased range of motion;Decreased activity tolerance;Impaired balance (sitting and/or standing);Decreased safety awareness;Decreased knowledge of use of DME or AE;Pain   OT Treatment/Interventions: Self-care/ADL training;Therapeutic exercise;Energy conservation;DME and/or AE instruction;Therapeutic activities;Patient/family education;Balance training    OT Goals(Current goals can be found in the care plan section) Acute Rehab OT Goals Patient Stated Goal: To live on my own OT Goal Formulation: With patient Time For Goal Achievement: 09/01/13 Potential to Achieve Goals: Good ADL Goals Pt Will Perform Grooming: with min assist;standing Pt Will Transfer to Toilet: with min assist;stand pivot transfer;bedside  commode Pt Will Perform Toileting - Clothing Manipulation and hygiene: with min assist;sit to/from stand Additional ADL Goal #1: Pt will perform bed mobility with min (A) to prepare for ADLs  OT Frequency: Min 2X/week   Barriers to D/C: Decreased caregiver support  pt lives at home alone          End of Session Equipment Utilized During Treatment: Gait belt;Rolling walker Nurse Communication: Mobility status;Other (comment) (neck pain)  Activity Tolerance: Patient tolerated treatment well Patient left: in bed;with call bell/phone within reach;with family/visitor present   Time: 4010-2725 OT Time Calculation (min): 54 min Charges:  OT General Charges $OT Visit: 1 Procedure OT Evaluation $Initial OT Evaluation Tier I: 1 Procedure OT Treatments $Self Care/Home Management : 23-37 mins $Therapeutic Exercise: 8-22 mins  Juluis Rainier [phone] 08/18/2013, 4:51 PM

## 2013-08-18 NOTE — Consult Note (Signed)
Physical Medicine and Rehabilitation Consult  Reason for Consult: Left displaced femoral neck fracture Referring Physician: Dr. Sheran Fava   HPI: Anna Farrell is a 80 y.o. female with history of SSS, A fib with PPM, orthostatic hypotension, OSA (new to CPAP); who slipped and fell while in [place] on on 08/15/13 with subsequent displaced left femoral neck fracture. She was transferred to [facility] per family request and was evaluated by Dr. Alain Marion. Chronic coumadin reversed and patient underwent left hip bipolar hemi on 08/17/13. Post op is WBAT and coumadin resumed. PT evaluation done today. MD, family and rehab team requesting CIR for follow up.    Review of Systems  Constitutional: Positive for weight loss (30 lbs in the past few months.).  Eyes: Negative for blurred vision and double vision.  Respiratory: Positive for cough and shortness of breath. Negative for wheezing.   Cardiovascular: Negative for chest pain and palpitations.  Gastrointestinal: Positive for heartburn and abdominal pain (on and off due to recent pancreatitis. ). Negative for nausea and vomiting.  Genitourinary: Positive for frequency (get up 3-4 times nightly).       Mild incontinence  Musculoskeletal: Positive for back pain and joint pain (chronic right knee pain.).  Neurological: Negative for dizziness, tingling, weakness and headaches.  Psychiatric/Behavioral: Negative for depression. The patient does not have insomnia.      Past Medical History  Diagnosis Date  . Vasovagal syncope   . Hypertension   . Hyperlipidemia   . Orthostatic hypotension   . Osteoporosis   . Pacemaker 10/11/06    implanted by Dr Leonia Reeves for pauses > 4 seconds with afib  . Iron deficiency anemia   . Shortness of breath   . Arthritis   . Colitis 5/13  . Chronic low back pain 2008    crushed vertebrae  . Tachycardia-bradycardia syndrome   . Permanent atrial fibrillation   . Asymptomatic PVCs   . History of colonic polyps    . Pure hypercholesterolemia   . Extrinsic asthma, unspecified     LOV Dr Joya Gaskins 7/12  . Long term (current) use of anticoagulants   . Cholelithiasis   . Generalized osteoarthrosis, unspecified site    Past Surgical History  Procedure Laterality Date  . Vesicovaginal fistula closure w/ tah  1981  . Pacemaker insertion  10/11/2006    SJM Zephyr XL DR implanted by Dr Leonia Reeves for afib with pause > 4 seconds  . Colonoscopy    . Appendectomy    . Abdominal hysterectomy    . Cholecystectomy  09/17/2011    Procedure: LAPAROSCOPIC CHOLECYSTECTOMY;  Surgeon: Rolm Bookbinder, MD;  Location: [facility];  Service: General;;  . Cardiac catheterization  2009    Normal coronary arteries  . Ercp N/A 04/07/2013    Procedure: ENDOSCOPIC RETROGRADE CHOLANGIOPANCREATOGRAPHY (ERCP);  Surgeon: Jeryl Columbia, MD;  Location: [facility];  Service: Endoscopy;  Laterality: N/A;  . Eus  04/07/2013    Procedure: UPPER ENDOSCOPIC ULTRASOUND (EUS) RADIAL;  Surgeon: Jeryl Columbia, MD;  Location: [facility];  Service: Endoscopy;;  . Hip arthroplasty Left 08/17/2013    Procedure: ARTHROPLASTY BIPOLAR HIP;  Surgeon: Renette Butters, MD;  Location: [place];  Service: Orthopedics;  Laterality: Left;   Family History  Problem Relation Age of Onset  . Hypertension Mother    Social History:  Lives alone. Independent without AD. Used to [employer] for [employer] till about 10 years ago. Has family in town who can provide some  assistance past discharge. She reports that she has never smoked. She has never used smokeless tobacco. She reports that she does not drink alcohol or use illicit drugs.   Allergies  Allergen Reactions  . Codeine Nausea And Vomiting    Passed out   Medications Prior to Admission  Medication Sig Dispense Refill  . beclomethasone (QVAR) 40 MCG/ACT inhaler Inhale 2 puffs into the lungs 2 (two) times daily as needed (breathing, shortness of breath).      . Cholecalciferol (VITAMIN D3) 1000 UNITS CAPS Take 2  capsules by mouth daily.       Marland Kitchen lidocaine (LIDODERM) 5 % Place 1 patch onto the skin daily.       . metoprolol succinate (TOPROL-XL) 25 MG 24 hr tablet Take 25 mg by mouth 2 (two) times daily.      . pravastatin (PRAVACHOL) 40 MG tablet Take 1 tablet (40 mg total) by mouth daily. Once daily  90 tablet  1  . traMADol (ULTRAM) 50 MG tablet Take 50 mg by mouth every 8 (eight) hours as needed. As needed for pain. Two tabs in the morning (100 mg) two tabs in evening (100 mg ) total 200 mg daily      . TRAVATAN Z 0.004 % SOLN ophthalmic solution Place 1 drop into both eyes daily.      . vitamin B-12 (CYANOCOBALAMIN) 100 MCG tablet Take 100 mcg by mouth once a week.      . warfarin (COUMADIN) 3 MG tablet 1 tablet daily except 1.5 tablets on Thursdays or as directed by coumadin clinic  105 tablet  1  . nitroGLYCERIN (NITROSTAT) 0.4 MG SL tablet Place 1 tablet (0.4 mg total) under the tongue every 5 (five) minutes as needed for chest pain.  90 tablet  3    Home: Home Living Family/patient expects to be discharged to:: Inpatient rehab Living Arrangements: Alone  Functional History: Prior Function Level of Independence: Independent Comments: 2x/week cleaning lady Functional Status:  Mobility: Bed Mobility Overal bed mobility: Needs Assistance Bed Mobility: Supine to Sit Supine to sit: Mod assist;+2 for physical assistance General bed mobility comments: cues for sequencing and technique.  Encouragement to try as much as she can.   Transfers Overall transfer level: Needs assistance Equipment used: Rolling walker (2 wheeled) Transfers: Sit to/from Stand Sit to Stand: Mod assist;+2 physical assistance General transfer comment: cues for UE use, positioning of LEs, and controlling descent to sitting.   Ambulation/Gait Ambulation/Gait assistance: Min assist;+2 physical assistance Ambulation Distance (Feet): 4 Feet Assistive device: Rolling walker (2 wheeled) Gait Pattern/deviations: Step-to  pattern;Decreased step length - right;Decreased stance time - left;Decreased stride length;Trunk flexed Gait velocity interpretation: Below normal speed for age/gender General Gait Details: pt moves very slowly and needs multiple small steps on R LE to advance up to L LE.  pt needs A with balance and management of RW.      ADL:    Cognition: Cognition Overall Cognitive Status: Within Functional Limits for tasks assessed Orientation Level: Oriented X4 Cognition Arousal/Alertness: Awake/alert Behavior During Therapy: WFL for tasks assessed/performed Overall Cognitive Status: Within Functional Limits for tasks assessed  Blood pressure 96/50, pulse 88, temperature 98.3 F (36.8 C), temperature source Oral, resp. rate 18, height 5\' 2"  (1.575 m), weight 60.827 kg (134 lb 1.6 oz), SpO2 95.00%. Physical Exam  Nursing note and vitals reviewed. Constitutional: She is oriented to person, place, and time. She appears well-developed.  HENT:  Head: Normocephalic and atraumatic.  Eyes: Conjunctivae are  normal. Pupils are equal, round, and reactive to light.  Neck: Normal range of motion. Neck supple.  Cardiovascular: Normal rate and regular rhythm.   Respiratory: Effort normal and breath sounds normal. No respiratory distress. She has no wheezes.  GI: Soft. Bowel sounds are normal.  Musculoskeletal:  Left hip with surgical dressing in place. Moderate edema. LLE limited by pain. Moves BUE without difficulty.   Neurological: She is alert and oriented to person, place, and time.  Skin: Skin is warm and dry.  Psychiatric: She has a normal mood and affect. Her behavior is normal. Judgment and thought content normal.   5/5 bilateral deltoid, bicep, tricep, grip 2 minus bilateral hip flexor knee extensor 3 minus bilateral ankle dorsiflexor plantar flexor Sensation intact to light touch in bilateral lower extremities  Results for orders placed during the hospital encounter of 08/15/13 (from the past  24 hour(s))  HEPARIN LEVEL (UNFRACTIONATED)     Status: Abnormal   Collection Time    08/18/13  1:03 AM      Result Value Ref Range   Heparin Unfractionated 0.17 (*) 0.30 - 0.70 IU/mL  COMPREHENSIVE METABOLIC PANEL     Status: Abnormal   Collection Time    08/18/13  6:07 AM      Result Value Ref Range   Sodium 141  137 - 147 mEq/L   Potassium 3.4 (*) 3.7 - 5.3 mEq/L   Chloride 101  96 - 112 mEq/L   CO2 29  19 - 32 mEq/L   Glucose, Bld 134 (*) 70 - 99 mg/dL   BUN 4 (*) 6 - 23 mg/dL   Creatinine, Ser 0.55  0.50 - 1.10 mg/dL   Calcium 8.5  8.4 - 10.5 mg/dL   Total Protein 5.4 (*) 6.0 - 8.3 g/dL   Albumin 2.5 (*) 3.5 - 5.2 g/dL   AST 24  0 - 37 U/L   ALT 23  0 - 35 U/L   Alkaline Phosphatase 75  39 - 117 U/L   Total Bilirubin 1.0  0.3 - 1.2 mg/dL   GFR calc non Af Amer 84 (*) >90 mL/min   GFR calc Af Amer >90  >90 mL/min   Anion gap 11  5 - 15  HEPARIN LEVEL (UNFRACTIONATED)     Status: Abnormal   Collection Time    08/18/13  6:07 AM      Result Value Ref Range   Heparin Unfractionated 0.29 (*) 0.30 - 0.70 IU/mL  CBC     Status: Abnormal   Collection Time    08/18/13  6:07 AM      Result Value Ref Range   WBC 8.5  4.0 - 10.5 K/uL   RBC 3.98  3.87 - 5.11 MIL/uL   Hemoglobin 11.3 (*) 12.0 - 15.0 g/dL   HCT 35.5 (*) 36.0 - 46.0 %   MCV 89.2  78.0 - 100.0 fL   MCH 28.4  26.0 - 34.0 pg   MCHC 31.8  30.0 - 36.0 g/dL   RDW 14.8  11.5 - 15.5 %   Platelets 152  150 - 400 K/uL  PROTIME-INR     Status: Abnormal   Collection Time    08/18/13  6:07 AM      Result Value Ref Range   Prothrombin Time 17.4 (*) 11.6 - 15.2 seconds   INR 1.42  0.00 - 1.49   Dg Pelvis Portable  08/17/2013   CLINICAL DATA:  Postop left total hip arthroplasty.  EXAM: PORTABLE PELVIS  1-2 VIEWS  COMPARISON:  CT 04/06/2013  FINDINGS: Exam demonstrates mild diffuse decreased bone mineralization. There are mild degenerative changes of the right hip. There is evidence of patient's left total hip arthroplasty  which appears adequately located and intact. Remainder of the exam is unchanged.  IMPRESSION: Recent left total hip arthroplasty intact.  Mild degenerative change right hip.   Electronically Signed   By: Marin Olp M.D.   On: 08/17/2013 09:43   Dg Chest Port 1 View  08/18/2013   CLINICAL DATA:  Shortness of breath following recent left hip arthroplasty. Fever.  EXAM: PORTABLE CHEST - 1 VIEW  COMPARISON:  08/15/2013 and 04/06/2013.  FINDINGS: 0739 hr. Left subclavian AV sequential pacemaker leads are unchanged within the right atrium and right ventricle. There is stable mild cardiomegaly and aortic atherosclerosis. There are lower lung volumes with resulting patchy bibasilar opacities, likely atelectasis. In this clinical setting, early pneumonia cannot be excluded although there is no consolidation. There is no pleural effusion or pneumothorax. The osseous structures appear unchanged.  IMPRESSION: New patchy bibasilar opacities, likely atelectasis.   Electronically Signed   By: Camie Patience M.D.   On: 08/18/2013 08:28    Assessment/Plan: Diagnosis: Left femoral neck fracture status post hemiarthroplasty 1. Does the need for close, 24 hr/day medical supervision in concert with the patient's rehab needs make it unreasonable for this patient to be served in a less intensive setting? Yes 2. Co-Morbidities requiring supervision/potential complications: Chronic atrial fibrillation, tachycardia bradycardia syndrome, fever, acute blood loss anemia 3. Due to bladder management, bowel management, safety, skin/wound care, disease management, medication administration, pain management and patient education, does the patient require 24 hr/day rehab nursing? Yes 4. Does the patient require coordinated care of a physician, rehab nurse, PT (1-2 hrs/day, 5 days/week) and OT (1-2 hrs/day, 5 days/week) to address physical and functional deficits in the context of the above medical diagnosis(es)? Yes Addressing deficits  in the following areas: balance, endurance, locomotion, strength, transferring, bowel/bladder control, bathing, dressing, feeding, grooming and toileting 5. Can the patient actively participate in an intensive therapy program of at least 3 hrs of therapy per day at least 5 days per week? Yes 6. The potential for patient to make measurable gains while on inpatient rehab is excellent 7. Anticipated functional outcomes upon discharge from inpatient rehab are modified independent  with PT, modified independent with OT, n/a with SLP. 8. Estimated rehab length of stay to reach the above functional goals is: 7 days 9. Does the patient have adequate social supports to accommodate these discharge functional goals? Yes 10. Anticipated D/C setting: Home 11. Anticipated post D/C treatments: [facility] therapy 12. Overall Rehab/Functional Prognosis: excellent  RECOMMENDATIONS: This patient's condition is appropriate for continued rehabilitative care in the following setting: CIR Patient has agreed to participate in recommended program. Potentially Note that insurance prior authorization may be required for reimbursement for recommended care.  Comment: Patient will discuss with family    08/18/2013

## 2013-08-18 NOTE — Progress Notes (Signed)
I received call from pt's son, Gerald Stabs, who requests inpt rehab admission tomorrow. I will follow up in the morning and arrange. [phone]

## 2013-08-18 NOTE — Progress Notes (Signed)
ANTICOAGULATION/ANTIBIOTIC CONSULT NOTE   Pharmacy Consult for Coumadin and Levaquin Indication: atrial fibrillation and CAP  Allergies  Allergen Reactions  . Codeine Nausea And Vomiting    Passed out    Patient Measurements: Height: 5\' 2"  (157.5 cm) Weight: 134 lb 1.6 oz (60.827 kg) IBW/kg (Calculated) : 50.1  Vital Signs: Temp: 98.3 F (36.8 C) (08/12 0950) Temp src: Oral (08/12 0950) BP: 96/50 mmHg (08/12 0950) Pulse Rate: 88 (08/12 0950)  Labs:  Recent Labs  08/15/13 1603 08/16/13 0537 08/16/13 1700 08/17/13 0424 08/18/13 0103 08/18/13 0607  HGB 13.7 13.2  --   --   --  11.3*  HCT 42.7 42.0  --   --   --  35.5*  PLT 198 180  --   --   --  152  LABPROT 31.1* 28.3* 21.5* 16.5*  --  17.4*  INR 3.00* 2.65* 1.87* 1.33  --  1.42  HEPARINUNFRC  --   --   --   --  0.17* 0.29*  CREATININE 0.63  --   --   --   --  0.55    Estimated Creatinine Clearance: 45.8 ml/min (by C-G formula based on Cr of 0.55).  Assessment: 80 y.o. female presents with fall, L displaced femoral neck fracture. Pt s/p hip arthroplasty 8/11.  Anticoagulation: Pt on coumadin PTA for afib. Reversed for surgery with total Vit K dose 15mg  (given 8/9-8/10). INR 1.42  - coumadin restarted 8/11 post-op. Pt was on post-op heparin bridge which was changed to DVT prophylaxis Lovenox this a.m. Will likely take a while to overcome vit K. Noted pt started on levaquin today which will potentiate effects of coumadin. Home dose 3mg  daily except for 4.5mg  on Thur. INR on admit = 3  ID: Levaquin Day #1 for possible CAP. Pt with fevers post-op. Tm 100.5. WBC wnl.  Renal: SCr stable  Goal of Therapy:  INR 2-3 Monitor platelets by anticoagulation protocol: Yes   Plan:  1. Coumadin 4.5mg  again today 2. Daily INR 3. Continue Lovenox 40mg  daily until INR >/= 2  4. Levaquin 750mg  IV q48h. 5. Will f/u micro data, pt's clinical condition, renal function  Sherlon Handing, PharmD, BCPS Clinical pharmacist, pager  [phone] 08/18/2013,1:56 PM

## 2013-08-18 NOTE — Progress Notes (Signed)
     Subjective:  Feeling well overall.  Patient reports hip pain as mild.  She notes some diffuse mild to moderate left sided pain secondary to fall on left side.    Objective:   VITALS:   Filed Vitals:   08/18/13 0000 08/18/13 0125 08/18/13 0400 08/18/13 0536  BP:  138/93  140/92  Pulse:  105  97  Temp:  100 F (37.8 C)  98.9 F (37.2 C)  TempSrc:      Resp: 17 16 18 18   Height:      Weight:      SpO2:  96%  100%    ABD soft Sensation intact distally Dorsiflexion/Plantar flexion intact Incision: no drainage   Lab Results  Component Value Date   WBC 8.5 08/18/2013   HGB 11.3* 08/18/2013   HCT 35.5* 08/18/2013   MCV 89.2 08/18/2013   PLT 152 08/18/2013     Assessment/Plan: 1 Day Post-Op   Active Problems:   Left displaced femoral neck fracture   Hyperlipidemia  Continue plan per medicine WBAT Dry Dressings PRN  Advance diet Up with therapy   Anna Farrell 08/18/2013, 8:00 AM   Edmonia Lynch MD [phone]

## 2013-08-18 NOTE — Evaluation (Signed)
Physical Therapy Evaluation Patient Details Name: Anna Farrell MRN: [id number] DOB: 10-06-1927 Today's Date: 08/18/2013   History of Present Illness  pt presents after fall sustaining L femur fx, now post THA.    Clinical Impression  Pt needs encouragement for ambulation, but does try everything PT asks of her.  Feel pt would benefit from CIR at D/C to maximize independence prior to returning home with family support.  Will continue to follow.      Follow Up Recommendations CIR    Equipment Recommendations  None recommended by PT    Recommendations for Other Services Rehab consult     Precautions / Restrictions Precautions Precautions: Posterior Hip;Fall Precaution Booklet Issued: Yes (comment) Precaution Comments: Reviewed posterior precautions.   Restrictions Weight Bearing Restrictions: Yes LLE Weight Bearing: Weight bearing as tolerated      Mobility  Bed Mobility Overal bed mobility: Needs Assistance Bed Mobility: Supine to Sit     Supine to sit: Mod assist;+2 for physical assistance     General bed mobility comments: cues for sequencing and technique.  Encouragement to try as much as she can.    Transfers Overall transfer level: Needs assistance Equipment used: Rolling walker (2 wheeled) Transfers: Sit to/from Stand Sit to Stand: Mod assist;+2 physical assistance         General transfer comment: cues for UE use, positioning of LEs, and controlling descent to sitting.    Ambulation/Gait Ambulation/Gait assistance: Min assist;+2 physical assistance Ambulation Distance (Feet): 4 Feet Assistive device: Rolling walker (2 wheeled) Gait Pattern/deviations: Step-to pattern;Decreased step length - right;Decreased stance time - left;Decreased stride length;Trunk flexed   Gait velocity interpretation: Below normal speed for age/gender General Gait Details: pt moves very slowly and needs multiple small steps on R LE to advance up to L LE.  pt needs A with balance  and management of RW.    Stairs            Wheelchair Mobility    Modified Rankin (Stroke Patients Only)       Balance Overall balance assessment: Needs assistance Sitting-balance support: Bilateral upper extremity supported;Feet supported Sitting balance-Leahy Scale: Poor     Standing balance support: Bilateral upper extremity supported Standing balance-Leahy Scale: Poor                               Pertinent Vitals/Pain Pain Assessment: 0-10 Pain Score: 4  Pain Location: L hip Pain Descriptors / Indicators: Aching Pain Intervention(s): Premedicated before session    Home Living Family/patient expects to be discharged to:: Inpatient rehab Living Arrangements: Alone                    Prior Function Level of Independence: Independent         Comments: 2x/week cleaning lady     Hand Dominance        Extremity/Trunk Assessment   Upper Extremity Assessment: Defer to OT evaluation           Lower Extremity Assessment: LLE deficits/detail   LLE Deficits / Details: AROM WFL, but strength limited post op.    Cervical / Trunk Assessment: Kyphotic  Communication   Communication: No difficulties  Cognition Arousal/Alertness: Awake/alert Behavior During Therapy: WFL for tasks assessed/performed Overall Cognitive Status: Within Functional Limits for tasks assessed                      General Comments  Exercises Total Joint Exercises Ankle Circles/Pumps: AROM;Both;10 reps Quad Sets: AROM;Both;10 reps Long Arc Quad: AROM;Left;10 reps      Assessment/Plan    PT Assessment Patient needs continued PT services  PT Diagnosis Difficulty walking;Acute pain   PT Problem List Decreased strength;Decreased activity tolerance;Decreased balance;Decreased mobility;Decreased knowledge of use of DME;Decreased knowledge of precautions;Pain  PT Treatment Interventions Gait training;DME instruction;Stair training;Functional  mobility training;Therapeutic activities;Therapeutic exercise;Balance training;Patient/family education   PT Goals (Current goals can be found in the Care Plan section) Acute Rehab PT Goals Patient Stated Goal: Get back walking.   PT Goal Formulation: With patient Time For Goal Achievement: 09/01/13 Potential to Achieve Goals: Good    Frequency Min 5X/week   Barriers to discharge        Co-evaluation               End of Session Equipment Utilized During Treatment: Gait belt Activity Tolerance: Patient limited by fatigue Patient left: in chair;with call bell/phone within reach Nurse Communication: Mobility status         Time: 6599-3570 PT Time Calculation (min): 27 min   Charges:   PT Evaluation $Initial PT Evaluation Tier I: 1 Procedure PT Treatments $Gait Training: 8-22 mins $Therapeutic Exercise: 8-22 mins   PT G CodesCatarina Hartshorn, [place] 08/18/2013, 10:02 AM

## 2013-08-18 NOTE — Progress Notes (Signed)
I participated in the care of this patient and agree with the above history, physical and evaluation. I performed a review of the history and a physical exam as detailed   Timothy Daniel Murphy MD  

## 2013-08-18 NOTE — Progress Notes (Signed)
[facility] for Heparin Indication: atrial fibrillation  Allergies  Allergen Reactions  . Codeine Nausea And Vomiting    Passed out    Patient Measurements: Height: 5\' 2"  (157.5 cm) Weight: 134 lb 1.6 oz (60.827 kg) IBW/kg (Calculated) : 50.1  Vital Signs: Temp: 100 F (37.8 C) (08/12 0125) BP: 138/93 mmHg (08/12 0125) Pulse Rate: 105 (08/12 0125)  Labs:  Recent Labs  08/15/13 1603 08/16/13 0537 08/16/13 1700 08/17/13 0424 08/18/13 0103  HGB 13.7 13.2  --   --   --   HCT 42.7 42.0  --   --   --   PLT 198 180  --   --   --   LABPROT 31.1* 28.3* 21.5* 16.5*  --   INR 3.00* 2.65* 1.87* 1.33  --   HEPARINUNFRC  --   --   --   --  0.17*  CREATININE 0.63  --   --   --   --     Estimated Creatinine Clearance: 45.8 ml/min (by C-G formula based on Cr of 0.63).  Assessment: [name] s/p THA, h/o Afib, for heparin   Goal of Therapy:  Heparin level 0.3-0.5 with recent surgery; INR 2-3 Monitor platelets by anticoagulation protocol: Yes   Plan:  Increase Heparin 950 units/hr Check heparin level in 8 hours.    Phillis Knack, PharmD, BCPS  08/18/2013,4:25 AM

## 2013-08-19 ENCOUNTER — Inpatient Hospital Stay (HOSPITAL_COMMUNITY): Payer: Medicare Other

## 2013-08-19 ENCOUNTER — Inpatient Hospital Stay (HOSPITAL_COMMUNITY)
Admission: RE | Admit: 2013-08-19 | Discharge: 2013-08-31 | DRG: 945 | Disposition: A | Discharge status: Home Health | Payer: Medicare Other | Source: Intra-hospital | Attending: Physical Medicine & Rehabilitation | Admitting: Physical Medicine & Rehabilitation

## 2013-08-19 DIAGNOSIS — G4733 Obstructive sleep apnea (adult) (pediatric): Secondary | ICD-10-CM | POA: Diagnosis not present

## 2013-08-19 DIAGNOSIS — J189 Pneumonia, unspecified organism: Secondary | ICD-10-CM

## 2013-08-19 DIAGNOSIS — Z90710 Acquired absence of both cervix and uterus: Secondary | ICD-10-CM | POA: Diagnosis not present

## 2013-08-19 DIAGNOSIS — Z7901 Long term (current) use of anticoagulants: Secondary | ICD-10-CM | POA: Diagnosis not present

## 2013-08-19 DIAGNOSIS — I482 Chronic atrial fibrillation, unspecified: Secondary | ICD-10-CM

## 2013-08-19 DIAGNOSIS — J9600 Acute respiratory failure, unspecified whether with hypoxia or hypercapnia: Secondary | ICD-10-CM

## 2013-08-19 DIAGNOSIS — S72009A Fracture of unspecified part of neck of unspecified femur, initial encounter for closed fracture: Secondary | ICD-10-CM

## 2013-08-19 DIAGNOSIS — Z5189 Encounter for other specified aftercare: Principal | ICD-10-CM

## 2013-08-19 DIAGNOSIS — K5903 Drug induced constipation: Secondary | ICD-10-CM

## 2013-08-19 DIAGNOSIS — E876 Hypokalemia: Secondary | ICD-10-CM | POA: Diagnosis not present

## 2013-08-19 DIAGNOSIS — I495 Sick sinus syndrome: Secondary | ICD-10-CM | POA: Diagnosis present

## 2013-08-19 DIAGNOSIS — D509 Iron deficiency anemia, unspecified: Secondary | ICD-10-CM

## 2013-08-19 DIAGNOSIS — Z950 Presence of cardiac pacemaker: Secondary | ICD-10-CM | POA: Diagnosis present

## 2013-08-19 DIAGNOSIS — E785 Hyperlipidemia, unspecified: Secondary | ICD-10-CM | POA: Diagnosis not present

## 2013-08-19 DIAGNOSIS — Z860100 Personal history of colon polyps, unspecified: Secondary | ICD-10-CM | POA: Diagnosis not present

## 2013-08-19 DIAGNOSIS — N390 Urinary tract infection, site not specified: Secondary | ICD-10-CM

## 2013-08-19 DIAGNOSIS — K5900 Constipation, unspecified: Secondary | ICD-10-CM | POA: Diagnosis not present

## 2013-08-19 DIAGNOSIS — Z79899 Other long term (current) drug therapy: Secondary | ICD-10-CM

## 2013-08-19 DIAGNOSIS — I951 Orthostatic hypotension: Secondary | ICD-10-CM | POA: Diagnosis not present

## 2013-08-19 DIAGNOSIS — S72033A Displaced midcervical fracture of unspecified femur, initial encounter for closed fracture: Secondary | ICD-10-CM

## 2013-08-19 DIAGNOSIS — D62 Acute posthemorrhagic anemia: Secondary | ICD-10-CM | POA: Diagnosis not present

## 2013-08-19 DIAGNOSIS — R000 Tachycardia, unspecified: Secondary | ICD-10-CM

## 2013-08-19 DIAGNOSIS — I10 Essential (primary) hypertension: Secondary | ICD-10-CM | POA: Diagnosis not present

## 2013-08-19 DIAGNOSIS — I493 Ventricular premature depolarization: Secondary | ICD-10-CM | POA: Diagnosis present

## 2013-08-19 DIAGNOSIS — T83511A Infection and inflammatory reaction due to indwelling urethral catheter, initial encounter: Secondary | ICD-10-CM

## 2013-08-19 DIAGNOSIS — R0602 Shortness of breath: Secondary | ICD-10-CM | POA: Diagnosis not present

## 2013-08-19 DIAGNOSIS — R339 Retention of urine, unspecified: Secondary | ICD-10-CM | POA: Diagnosis not present

## 2013-08-19 DIAGNOSIS — J9601 Acute respiratory failure with hypoxia: Secondary | ICD-10-CM

## 2013-08-19 DIAGNOSIS — IMO0002 Reserved for concepts with insufficient information to code with codable children: Secondary | ICD-10-CM | POA: Diagnosis not present

## 2013-08-19 DIAGNOSIS — I5031 Acute diastolic (congestive) heart failure: Secondary | ICD-10-CM | POA: Diagnosis present

## 2013-08-19 DIAGNOSIS — W010XXA Fall on same level from slipping, tripping and stumbling without subsequent striking against object, initial encounter: Secondary | ICD-10-CM | POA: Diagnosis not present

## 2013-08-19 DIAGNOSIS — Z96649 Presence of unspecified artificial hip joint: Secondary | ICD-10-CM | POA: Diagnosis not present

## 2013-08-19 DIAGNOSIS — J45909 Unspecified asthma, uncomplicated: Secondary | ICD-10-CM | POA: Diagnosis not present

## 2013-08-19 DIAGNOSIS — Z885 Allergy status to narcotic agent status: Secondary | ICD-10-CM

## 2013-08-19 DIAGNOSIS — I4891 Unspecified atrial fibrillation: Secondary | ICD-10-CM | POA: Diagnosis not present

## 2013-08-19 HISTORY — DX: Acute pancreatitis without necrosis or infection, unspecified: K85.90

## 2013-08-19 LAB — CBC
HEMATOCRIT: 29.5 % — AB (ref 36.0–46.0)
Hemoglobin: 9.6 g/dL — ABNORMAL LOW (ref 12.0–15.0)
MCH: 28.4 pg (ref 26.0–34.0)
MCHC: 32.5 g/dL (ref 30.0–36.0)
MCV: 87.3 fL (ref 78.0–100.0)
PLATELETS: 146 10*3/uL — AB (ref 150–400)
RBC: 3.38 MIL/uL — ABNORMAL LOW (ref 3.87–5.11)
RDW: 15.1 % (ref 11.5–15.5)
WBC: 8 10*3/uL (ref 4.0–10.5)

## 2013-08-19 LAB — PROTIME-INR
INR: 1.37 (ref 0.00–1.49)
Prothrombin Time: 16.9 seconds — ABNORMAL HIGH (ref 11.6–15.2)

## 2013-08-19 MED ORDER — VITAMIN B-12 100 MCG PO TABS
100.0000 ug | ORAL_TABLET | ORAL | Status: DC
  Administered 2013-08-22 – 2013-08-29 (×2): 100 ug via ORAL
  Filled 2013-08-19 (×2): qty 1

## 2013-08-19 MED ORDER — PROCHLORPERAZINE MALEATE 5 MG PO TABS
5.0000 mg | ORAL_TABLET | Freq: Four times a day (QID) | ORAL | Status: DC | PRN
  Filled 2013-08-19: qty 2

## 2013-08-19 MED ORDER — LEVOFLOXACIN 750 MG PO TABS
750.0000 mg | ORAL_TABLET | ORAL | Status: DC
Start: 2013-08-20 — End: 2013-08-31

## 2013-08-19 MED ORDER — FLUTICASONE PROPIONATE HFA 44 MCG/ACT IN AERO
2.0000 | INHALATION_SPRAY | Freq: Two times a day (BID) | RESPIRATORY_TRACT | Status: DC
  Administered 2013-08-19 – 2013-08-31 (×24): 2 via RESPIRATORY_TRACT
  Filled 2013-08-19: qty 10.6

## 2013-08-19 MED ORDER — OXYCODONE HCL 5 MG PO TABS
5.0000 mg | ORAL_TABLET | ORAL | Status: DC | PRN
  Administered 2013-08-19 – 2013-08-31 (×23): 5 mg via ORAL
  Filled 2013-08-19 (×23): qty 1

## 2013-08-19 MED ORDER — TRAZODONE HCL 50 MG PO TABS
25.0000 mg | ORAL_TABLET | Freq: Every evening | ORAL | Status: DC | PRN
  Administered 2013-08-23: 50 mg via ORAL
  Filled 2013-08-19: qty 1

## 2013-08-19 MED ORDER — TRAMADOL HCL 50 MG PO TABS
50.0000 mg | ORAL_TABLET | Freq: Four times a day (QID) | ORAL | Status: DC | PRN
  Administered 2013-08-19 – 2013-08-21 (×5): 50 mg via ORAL
  Filled 2013-08-19 (×6): qty 1

## 2013-08-19 MED ORDER — FLEET ENEMA 7-19 GM/118ML RE ENEM: 1.0000 | ENEMA | Freq: Once | RECTAL | Status: AC | PRN

## 2013-08-19 MED ORDER — ACETAMINOPHEN 325 MG PO TABS
325.0000 mg | ORAL_TABLET | ORAL | Status: DC | PRN
  Filled 2013-08-19: qty 2

## 2013-08-19 MED ORDER — METHOCARBAMOL 500 MG PO TABS
500.0000 mg | ORAL_TABLET | Freq: Four times a day (QID) | ORAL | Status: DC | PRN
  Administered 2013-08-19 – 2013-08-26 (×7): 500 mg via ORAL
  Filled 2013-08-19 (×10): qty 1

## 2013-08-19 MED ORDER — VITAMIN D3 25 MCG (1000 UNIT) PO TABS
2000.0000 [IU] | ORAL_TABLET | Freq: Every day | ORAL | Status: DC
  Administered 2013-08-20 – 2013-08-31 (×12): 2000 [IU] via ORAL
  Filled 2013-08-19 (×13): qty 2

## 2013-08-19 MED ORDER — WARFARIN - PHARMACIST DOSING INPATIENT: Freq: Every day | Status: DC

## 2013-08-19 MED ORDER — POLYSACCHARIDE IRON COMPLEX 150 MG PO CAPS
150.0000 mg | ORAL_CAPSULE | Freq: Every day | ORAL | Status: DC
  Administered 2013-08-20 – 2013-08-31 (×12): 150 mg via ORAL
  Filled 2013-08-19 (×14): qty 1

## 2013-08-19 MED ORDER — POLYETHYLENE GLYCOL 3350 17 G PO PACK
17.0000 g | PACK | Freq: Two times a day (BID) | ORAL | Status: DC
  Administered 2013-08-19 – 2013-08-30 (×14): 17 g via ORAL
  Filled 2013-08-19 (×26): qty 1

## 2013-08-19 MED ORDER — BISACODYL 10 MG RE SUPP
10.0000 mg | Freq: Every day | RECTAL | Status: DC | PRN
  Administered 2013-08-21: 10 mg via RECTAL
  Filled 2013-08-19: qty 1

## 2013-08-19 MED ORDER — POLYETHYLENE GLYCOL 3350 17 G PO PACK: 17.0000 g | PACK | Freq: Every day | ORAL | Status: DC

## 2013-08-19 MED ORDER — PROCHLORPERAZINE EDISYLATE 5 MG/ML IJ SOLN
5.0000 mg | Freq: Four times a day (QID) | INTRAMUSCULAR | Status: DC | PRN
  Filled 2013-08-19: qty 2

## 2013-08-19 MED ORDER — FLEET ENEMA 7-19 GM/118ML RE ENEM
1.0000 | ENEMA | Freq: Once | RECTAL | Status: AC
  Administered 2013-08-19: 1 via RECTAL
  Filled 2013-08-19: qty 1

## 2013-08-19 MED ORDER — BISACODYL 10 MG RE SUPP
10.0000 mg | Freq: Once | RECTAL | Status: AC
  Administered 2013-08-19: 10 mg via RECTAL
  Filled 2013-08-19: qty 1

## 2013-08-19 MED ORDER — ALUM & MAG HYDROXIDE-SIMETH 200-200-20 MG/5ML PO SUSP: 30.0000 mL | ORAL | Status: DC | PRN

## 2013-08-19 MED ORDER — METOPROLOL SUCCINATE ER 25 MG PO TB24
25.0000 mg | ORAL_TABLET | Freq: Two times a day (BID) | ORAL | Status: DC
  Administered 2013-08-19 – 2013-08-24 (×10): 25 mg via ORAL
  Filled 2013-08-19 (×13): qty 1

## 2013-08-19 MED ORDER — SIMVASTATIN 20 MG PO TABS
20.0000 mg | ORAL_TABLET | Freq: Every day | ORAL | Status: DC
  Administered 2013-08-19 – 2013-08-30 (×12): 20 mg via ORAL
  Filled 2013-08-19 (×13): qty 1

## 2013-08-19 MED ORDER — PROCHLORPERAZINE 25 MG RE SUPP
12.5000 mg | Freq: Four times a day (QID) | RECTAL | Status: DC | PRN
  Filled 2013-08-19: qty 1

## 2013-08-19 MED ORDER — GUAIFENESIN-DM 100-10 MG/5ML PO SYRP: 5.0000 mL | ORAL_SOLUTION | Freq: Four times a day (QID) | ORAL | Status: DC | PRN

## 2013-08-19 MED ORDER — ENOXAPARIN SODIUM 40 MG/0.4ML ~~LOC~~ SOLN
40.0000 mg | Freq: Every day | SUBCUTANEOUS | Status: DC
  Administered 2013-08-20 – 2013-08-21 (×2): 40 mg via SUBCUTANEOUS
  Filled 2013-08-19 (×3): qty 0.4

## 2013-08-19 MED ORDER — LEVOFLOXACIN IN D5W 750 MG/150ML IV SOLN
750.0000 mg | INTRAVENOUS | Status: DC
  Filled 2013-08-19: qty 150

## 2013-08-19 MED ORDER — WARFARIN SODIUM 6 MG PO TABS
6.0000 mg | ORAL_TABLET | Freq: Once | ORAL | Status: DC
  Filled 2013-08-19: qty 1

## 2013-08-19 MED ORDER — ENSURE COMPLETE PO LIQD
237.0000 mL | Freq: Two times a day (BID) | ORAL | Status: DC
  Administered 2013-08-20 – 2013-08-30 (×13): 237 mL via ORAL

## 2013-08-19 MED ORDER — FLUCONAZOLE 150 MG PO TABS
150.0000 mg | ORAL_TABLET | Freq: Once | ORAL | Status: AC
  Administered 2013-08-19: 150 mg via ORAL
  Filled 2013-08-19: qty 1

## 2013-08-19 MED ORDER — WARFARIN SODIUM 6 MG PO TABS
6.0000 mg | ORAL_TABLET | Freq: Once | ORAL | Status: AC
  Administered 2013-08-19: 6 mg via ORAL
  Filled 2013-08-19: qty 1

## 2013-08-19 NOTE — Progress Notes (Signed)
Report call to [employer] on rehab departmen. Pt being transferred to [id number] at this time. Family is aware of transfer and agrees with plan of care.

## 2013-08-19 NOTE — Progress Notes (Signed)
Physical Therapy Treatment Patient Details Name: EOLA WALDREP MRN: [id number] DOB: 1927/01/22 Today's Date: 08/19/2013    History of Present Illness Mercie Balsley is an [name] admitted 08/15/13 after a fall sustaining Lt femur fx, now s/p THA on 08/16/13.     PT Comments    Pt making improvements in mobility today, however indicates abdominal pressure and difficulty urinating.  RN aware and pt returned to bed for bladder scan.  Will continue to follow.    Follow Up Recommendations  CIR     Equipment Recommendations  None recommended by PT    Recommendations for Other Services Rehab consult     Precautions / Restrictions Precautions Precautions: Posterior Hip;Fall Precaution Comments: pt verbalized 3/3 hip precautions.   Restrictions Weight Bearing Restrictions: Yes LLE Weight Bearing: Weight bearing as tolerated    Mobility  Bed Mobility Overal bed mobility: Needs Assistance Bed Mobility: Supine to Sit;Sit to Supine     Supine to sit: Mod assist;+2 for physical assistance Sit to supine: Mod assist;+2 for physical assistance   General bed mobility comments: A with L LE and trunk when coming to sitting and returning to supine.    Transfers Overall transfer level: Needs assistance Equipment used: Rolling walker (2 wheeled) Transfers: Sit to/from Stand Sit to Stand: Min assist;+2 physical assistance         General transfer comment: pt better able to A with coming to standing today.  cues for anterior weight shifting with coming to standing.    Ambulation/Gait Ambulation/Gait assistance: Min assist;+2 physical assistance Ambulation Distance (Feet): 6 Feet (x2) Assistive device: Rolling walker (2 wheeled) Gait Pattern/deviations: Step-to pattern;Decreased step length - right;Decreased stance time - left;Decreased stride length;Trunk flexed   Gait velocity interpretation: Below normal speed for age/gender General Gait Details: pt still needs increased time  to advance R LE and WB on L LE, but improved today from yesterday.     Stairs            Wheelchair Mobility    Modified Rankin (Stroke Patients Only)       Balance Overall balance assessment: Needs assistance Sitting-balance support: Single extremity supported;Feet supported Sitting balance-Leahy Scale: Poor     Standing balance support: Single extremity supported;During functional activity Standing balance-Leahy Scale: Poor                      Cognition Arousal/Alertness: Awake/alert Behavior During Therapy: WFL for tasks assessed/performed Overall Cognitive Status: Within Functional Limits for tasks assessed                      Exercises      General Comments        Pertinent Vitals/Pain Pain Assessment: 0-10 Pain Score: 3  Pain Location: L hip Pain Descriptors / Indicators: Sore Pain Intervention(s): RN gave pain meds during session;Monitored during session    Home Living                      Prior Function            PT Goals (current goals can now be found in the care plan section) Acute Rehab PT Goals Patient Stated Goal: To live on my own PT Goal Formulation: With patient Time For Goal Achievement: 09/01/13 Potential to Achieve Goals: Good Progress towards PT goals: Progressing toward goals    Frequency  Min 5X/week    PT Plan Current plan remains appropriate  Co-evaluation             End of Session Equipment Utilized During Treatment: Gait belt Activity Tolerance: Patient limited by fatigue Patient left: in bed;with call bell/phone within reach     Time: 0846-0929 PT Time Calculation (min): 43 min  Charges:  $Gait Training: 8-22 mins $Therapeutic Activity: 8-22 mins                    G CodesCatarina Hartshorn, Shannon 08/19/2013, 9:49 AM

## 2013-08-19 NOTE — Progress Notes (Signed)
Physical Medicine and Rehabilitation Consult  Reason for Consult: Left displaced femoral neck fracture  Referring Physician: Dr. Sheran Fava  HPI: Anna Farrell is a 80 y.o. female with history of SSS, A fib with PPM, orthostatic hypotension, OSA (new to CPAP); who slipped and fell while in [place] on on 08/15/13 with subsequent displaced left femoral neck fracture. She was transferred to [facility] per family request and was evaluated by Dr. Alain Marion. Chronic coumadin reversed and patient underwent left hip bipolar hemi on 08/17/13. Post op is WBAT and coumadin resumed. PT evaluation done today. MD, family and rehab team requesting CIR for follow up.  Review of Systems  Constitutional: Positive for weight loss (30 lbs in the past few months.).  Eyes: Negative for blurred vision and double vision.  Respiratory: Positive for cough and shortness of breath. Negative for wheezing.  Cardiovascular: Negative for chest pain and palpitations.  Gastrointestinal: Positive for heartburn and abdominal pain (on and off due to recent pancreatitis. ). Negative for nausea and vomiting.  Genitourinary: Positive for frequency (get up 3-4 times nightly).  Mild incontinence  Musculoskeletal: Positive for back pain and joint pain (chronic right knee pain.).  Neurological: Negative for dizziness, tingling, weakness and headaches.  Psychiatric/Behavioral: Negative for depression. The patient does not have insomnia.   Past Medical History   Diagnosis  Date   .  Vasovagal syncope    .  Hypertension    .  Hyperlipidemia    .  Orthostatic hypotension    .  Osteoporosis    .  Pacemaker  10/11/06     implanted by Dr Leonia Reeves for pauses > 4 seconds with afib   .  Iron deficiency anemia    .  Shortness of breath    .  Arthritis    .  Colitis  5/13   .  Chronic low back pain  2008     crushed vertebrae   .  Tachycardia-bradycardia syndrome    .  Permanent atrial fibrillation    .  Asymptomatic PVCs    .  History of  colonic polyps    .  Pure hypercholesterolemia    .  Extrinsic asthma, unspecified      LOV Dr Joya Gaskins 7/12   .  Long term (current) use of anticoagulants    .  Cholelithiasis    .  Generalized osteoarthrosis, unspecified site     Past Surgical History   Procedure  Laterality  Date   .  Vesicovaginal fistula closure w/ tah   1981   .  Pacemaker insertion   10/11/2006     SJM Zephyr XL DR implanted by Dr Leonia Reeves for afib with pause > 4 seconds   .  Colonoscopy     .  Appendectomy     .  Abdominal hysterectomy     .  Cholecystectomy   09/17/2011     Procedure: LAPAROSCOPIC CHOLECYSTECTOMY; Surgeon: Rolm Bookbinder, MD; Location: [facility]; Service: General;;   .  Cardiac catheterization   2009     Normal coronary arteries   .  Ercp  N/A  04/07/2013     Procedure: ENDOSCOPIC RETROGRADE CHOLANGIOPANCREATOGRAPHY (ERCP); Surgeon: Jeryl Columbia, MD; Location: [facility]; Service: Endoscopy; Laterality: N/A;   .  Eus   04/07/2013     Procedure: UPPER ENDOSCOPIC ULTRASOUND (EUS) RADIAL; Surgeon: Jeryl Columbia, MD; Location: [facility]; Service: Endoscopy;;   .  Hip arthroplasty  Left  08/17/2013     Procedure:  ARTHROPLASTY BIPOLAR HIP; Surgeon: Renette Butters, MD; Location: [place]; Service: Orthopedics; Laterality: Left;    Family History   Problem  Relation  Age of Onset   .  Hypertension  Mother     Social History: Lives alone. Independent without AD. Used to [employer] for [employer] till about 10 years ago. Has family in town who can provide some assistance past discharge. She reports that she has never smoked. She has never used smokeless tobacco. She reports that she does not drink alcohol or use illicit drugs.  Allergies   Allergen  Reactions   .  Codeine  Nausea And Vomiting     Passed out    Medications Prior to Admission   Medication  Sig  Dispense  Refill   .  beclomethasone (QVAR) 40 MCG/ACT inhaler  Inhale 2 puffs into the lungs 2 (two) times daily as needed (breathing, shortness of  breath).     .  Cholecalciferol (VITAMIN D3) 1000 UNITS CAPS  Take 2 capsules by mouth daily.     Marland Kitchen  lidocaine (LIDODERM) 5 %  Place 1 patch onto the skin daily.     .  metoprolol succinate (TOPROL-XL) 25 MG 24 hr tablet  Take 25 mg by mouth 2 (two) times daily.     .  pravastatin (PRAVACHOL) 40 MG tablet  Take 1 tablet (40 mg total) by mouth daily. Once daily  90 tablet  1   .  traMADol (ULTRAM) 50 MG tablet  Take 50 mg by mouth every 8 (eight) hours as needed. As needed for pain. Two tabs in the morning (100 mg) two tabs in evening (100 mg ) total 200 mg daily     .  TRAVATAN Z 0.004 % SOLN ophthalmic solution  Place 1 drop into both eyes daily.     .  vitamin B-12 (CYANOCOBALAMIN) 100 MCG tablet  Take 100 mcg by mouth once a week.     .  warfarin (COUMADIN) 3 MG tablet  1 tablet daily except 1.5 tablets on Thursdays or as directed by coumadin clinic  105 tablet  1   .  nitroGLYCERIN (NITROSTAT) 0.4 MG SL tablet  Place 1 tablet (0.4 mg total) under the tongue every 5 (five) minutes as needed for chest pain.  90 tablet  3    Home:  Home Living  Family/patient expects to be discharged to:: Inpatient rehab  Living Arrangements: Alone  Functional History:  Prior Function  Level of Independence: Independent  Comments: 2x/week cleaning lady  Functional Status:  Mobility:  Bed Mobility  Overal bed mobility: Needs Assistance  Bed Mobility: Supine to Sit  Supine to sit: Mod assist;+2 for physical assistance  General bed mobility comments: cues for sequencing and technique. Encouragement to try as much as she can.  Transfers  Overall transfer level: Needs assistance  Equipment used: Rolling walker (2 wheeled)  Transfers: Sit to/from Stand  Sit to Stand: Mod assist;+2 physical assistance  General transfer comment: cues for UE use, positioning of LEs, and controlling descent to sitting.  Ambulation/Gait  Ambulation/Gait assistance: Min assist;+2 physical assistance  Ambulation Distance  (Feet): 4 Feet  Assistive device: Rolling walker (2 wheeled)  Gait Pattern/deviations: Step-to pattern;Decreased step length - right;Decreased stance time - left;Decreased stride length;Trunk flexed  Gait velocity interpretation: Below normal speed for age/gender  General Gait Details: pt moves very slowly and needs multiple small steps on R LE to advance up to L LE. pt needs A with balance and  management of RW.   ADL:   Cognition:  Cognition  Overall Cognitive Status: Within Functional Limits for tasks assessed  Orientation Level: Oriented X4  Cognition  Arousal/Alertness: Awake/alert  Behavior During Therapy: WFL for tasks assessed/performed  Overall Cognitive Status: Within Functional Limits for tasks assessed  Blood pressure 96/50, pulse 88, temperature 98.3 F (36.8 C), temperature source Oral, resp. rate 18, height 5\' 2"  (1.575 m), weight 60.827 kg (134 lb 1.6 oz), SpO2 95.00%.  Physical Exam  Nursing note and vitals reviewed.  Constitutional: She is oriented to person, place, and time. She appears well-developed.  HENT:  Head: Normocephalic and atraumatic.  Eyes: Conjunctivae are normal. Pupils are equal, round, and reactive to light.  Neck: Normal range of motion. Neck supple.  Cardiovascular: Normal rate and regular rhythm.  Respiratory: Effort normal and breath sounds normal. No respiratory distress. She has no wheezes.  GI: Soft. Bowel sounds are normal.  Musculoskeletal:  Left hip with surgical dressing in place. Moderate edema. LLE limited by pain. Moves BUE without difficulty.  Neurological: She is alert and oriented to person, place, and time.  Skin: Skin is warm and dry.  Psychiatric: She has a normal mood and affect. Her behavior is normal. Judgment and thought content normal.  5/5 bilateral deltoid, bicep, tricep, grip  2 minus bilateral hip flexor knee extensor 3 minus bilateral ankle dorsiflexor plantar flexor  Sensation intact to light touch in bilateral lower  extremities  Results for orders placed during the hospital encounter of 08/15/13 (from the past 24 hour(s))   HEPARIN LEVEL (UNFRACTIONATED) Status: Abnormal    Collection Time    08/18/13 1:03 AM   Result  Value  Ref Range    Heparin Unfractionated  0.17 (*)  0.30 - 0.70 IU/mL   COMPREHENSIVE METABOLIC PANEL Status: Abnormal    Collection Time    08/18/13 6:07 AM   Result  Value  Ref Range    Sodium  141  137 - 147 mEq/L    Potassium  3.4 (*)  3.7 - 5.3 mEq/L    Chloride  101  96 - 112 mEq/L    CO2  29  19 - 32 mEq/L    Glucose, Bld  134 (*)  70 - 99 mg/dL    BUN  4 (*)  6 - 23 mg/dL    Creatinine, Ser  0.55  0.50 - 1.10 mg/dL    Calcium  8.5  8.4 - 10.5 mg/dL    Total Protein  5.4 (*)  6.0 - 8.3 g/dL    Albumin  2.5 (*)  3.5 - 5.2 g/dL    AST  24  0 - 37 U/L    ALT  23  0 - 35 U/L    Alkaline Phosphatase  75  39 - 117 U/L    Total Bilirubin  1.0  0.3 - 1.2 mg/dL    GFR calc non Af Amer  84 (*)  >90 mL/min    GFR calc Af Amer  >90  >90 mL/min    Anion gap  11  5 - 15   HEPARIN LEVEL (UNFRACTIONATED) Status: Abnormal    Collection Time    08/18/13 6:07 AM   Result  Value  Ref Range    Heparin Unfractionated  0.29 (*)  0.30 - 0.70 IU/mL   CBC Status: Abnormal    Collection Time    08/18/13 6:07 AM   Result  Value  Ref Range    WBC  8.5  4.0 - 10.5 K/uL    RBC  3.98  3.87 - 5.11 MIL/uL    Hemoglobin  11.3 (*)  12.0 - 15.0 g/dL    HCT  35.5 (*)  36.0 - 46.0 %    MCV  89.2  78.0 - 100.0 fL    MCH  28.4  26.0 - 34.0 pg    MCHC  31.8  30.0 - 36.0 g/dL    RDW  14.8  11.5 - 15.5 %    Platelets  152  150 - 400 K/uL   PROTIME-INR Status: Abnormal    Collection Time    08/18/13 6:07 AM   Result  Value  Ref Range    Prothrombin Time  17.4 (*)  11.6 - 15.2 seconds    INR  1.42  0.00 - 1.49    Dg Pelvis Portable  08/17/2013 CLINICAL DATA: Postop left total hip arthroplasty. EXAM: PORTABLE PELVIS 1-2 VIEWS COMPARISON: CT 04/06/2013 FINDINGS: Exam demonstrates mild diffuse  decreased bone mineralization. There are mild degenerative changes of the right hip. There is evidence of patient's left total hip arthroplasty which appears adequately located and intact. Remainder of the exam is unchanged. IMPRESSION: Recent left total hip arthroplasty intact. Mild degenerative change right hip. Electronically Signed By: Marin Olp M.D. On: 08/17/2013 09:43  Dg Chest Port 1 View  08/18/2013 CLINICAL DATA: Shortness of breath following recent left hip arthroplasty. Fever. EXAM: PORTABLE CHEST - 1 VIEW COMPARISON: 08/15/2013 and 04/06/2013. FINDINGS: 0739 hr. Left subclavian AV sequential pacemaker leads are unchanged within the right atrium and right ventricle. There is stable mild cardiomegaly and aortic atherosclerosis. There are lower lung volumes with resulting patchy bibasilar opacities, likely atelectasis. In this clinical setting, early pneumonia cannot be excluded although there is no consolidation. There is no pleural effusion or pneumothorax. The osseous structures appear unchanged. IMPRESSION: New patchy bibasilar opacities, likely atelectasis. Electronically Signed By: Camie Patience M.D. On: 08/18/2013 08:28   Assessment/Plan:  Diagnosis: Left femoral neck fracture status post hemiarthroplasty  1. Does the need for close, 24 hr/day medical supervision in concert with the patient's rehab needs make it unreasonable for this patient to be served in a less intensive setting? Yes 2. Co-Morbidities requiring supervision/potential complications: Chronic atrial fibrillation, tachycardia bradycardia syndrome, fever, acute blood loss anemia 3. Due to bladder management, bowel management, safety, skin/wound care, disease management, medication administration, pain management and patient education, does the patient require 24 hr/day rehab nursing? Yes 4. Does the patient require coordinated care of a physician, rehab nurse, PT (1-2 hrs/day, 5 days/week) and OT (1-2 hrs/day, 5 days/week) to  address physical and functional deficits in the context of the above medical diagnosis(es)? Yes Addressing deficits in the following areas: balance, endurance, locomotion, strength, transferring, bowel/bladder control, bathing, dressing, feeding, grooming and toileting 5. Can the patient actively participate in an intensive therapy program of at least 3 hrs of therapy per day at least 5 days per week? Yes 6. The potential for patient to make measurable gains while on inpatient rehab is excellent 7. Anticipated functional outcomes upon discharge from inpatient rehab are modified independent with PT, modified independent with OT, n/a with SLP. 8. Estimated rehab length of stay to reach the above functional goals is: 7 days 9. Does the patient have adequate social supports to accommodate these discharge functional goals? Yes 10. Anticipated D/C setting: Home 11. Anticipated post D/C treatments: [place] therapy 12. Overall Rehab/Functional Prognosis: excellent RECOMMENDATIONS:  This patient's condition is appropriate for continued rehabilitative  care in the following setting: CIR  Patient has agreed to participate in recommended program. Potentially  Note that insurance prior authorization may be required for reimbursement for recommended care.  Comment: Patient will discuss with family  08/18/2013

## 2013-08-19 NOTE — Progress Notes (Signed)
Pt refuses to wear CPAP tonight. Pt encouraged to call RT if pt changes mind. No distress noted. 

## 2013-08-19 NOTE — Progress Notes (Signed)
Patient does not want to wear CPAP due to our not having comparable interface to what patient wears at home.

## 2013-08-19 NOTE — Discharge Summary (Addendum)
Physician Discharge Summary  Anna Farrell OBS:[id number] DOB: 12-Mar-1927 DOA: 08/15/2013  PCP: [name], MD  Admit date: 08/15/2013 Discharge date: 08/19/2013  Recommendations for Outpatient Follow-up:  1. Transfer to inpatient rehab for ongoing PT/OT 2. Please continue to wean oxygen 3. In and out catheterizations if unable to void every 6 hours as needed 4. Continue antibiotics through 8/18, next dose due on 8/14, then stop 5. WBAT, posterior hip precautions 6. Dry dressing prn 7. F/u with Dr. Percell Miller in 2 weeks 8. Repeat INR on Friday 9. Please f/u pending urine and blood cultures  Discharge Diagnoses:  Principal Problem:   Left displaced femoral neck fracture Active Problems:   Chronic atrial fibrillation   BRADYCARDIA-TACHYCARDIA SYNDROME   Hyperlipidemia   Hypokalemia   Acute blood loss anemia   CAP (community acquired pneumonia)   Drug-induced constipation   Catheter-associated urinary tract infection   Acute respiratory failure with hypoxia   Possible acute diastolic heart failure   Discharge Condition: stable, improved  Diet recommendation: regular with supplements  Wt Readings from Last 3 Encounters:  08/15/13 60.827 kg (134 lb 1.6 oz)  08/15/13 60.827 kg (134 lb 1.6 oz)  04/28/13 61.689 kg (136 lb)    History of present illness:   The patient is an 80-yo F with hx of hypertension, hyperlipidemia, permanent atrial fibrillation on warfarin, sick sinus syndrome, and asthma who presented after a mechanical fall with a left hip fracture of the femoral neck.    Hospital Course:   Left subcapital hip fracture s/p bipolar hip arthroplasty by Dr. Percell Miller on 8/11.  She should continue oxycodone as needed for pain, but is advised to minimize her doses if possible to reduce the risk of constipation and prolonged urinary retention.  She should continue resume coumadin for DVT prophylaxis with goal INR between 2 and 3.    Post-operative fever due to CAUTI and possible  CAP on 8/12.  Foley was already removed on POD 1.  Blood cultures are no growth to date.  Urine culture pending.  CXR with bibasilar opacities c/w atelectasis vs. Acute diastolic heart failure.  She was not given lasix due to low blood pressures.  She was started on levofloxacin for possible pneumonia and for UTI on 8/12 and her temperature has trended down.    Acute hypoxic respiratory failure likely secondary to atelectasis and acute diastolic heart failure from perioperative fluids.  Consider trial of lasix once blood pressure improved and able to void more comfortably.  Continue OOB and IS.  Tx for pneumonia as above.    Constipation.  continue colace, senna, miralax, and bisacodyl suppositories  Permanent a-fib and sick sinus syndrome, rate controlled and s/p PPM placement.  Tele demonstrated rate-controlled a-fib.  She continued metoprolol.  She received 15mg  of vit K prior to surgery and may take a long time to become therapeutic on her INR.  Continue coumadin and use lovenox for DVT prophylaxis pending therapeutic INR.  She does not need treatment dose lovenox since her CHADs2vasc score is moderate and she does not have an artificial heart valve or recent VTE.    Hypertension, BP low normal.  Continue metoprolol.  Do NOT resume norvasc - patient was NOT taking this prior to admission.    Asthma, stable, continued Qvar  OSA, offered cpap.  Hx of vertebral compression fracture s/p kyphoplasty  Hypokalemia, given oral potassium repletion  Acute blood loss anemia and thrombocytopenia post-operative, hgb trended down to 9.6 and plt 146, well above transfusion threshold.  No evidence of GIB or hematoma formation   Consultants:  T. Percell Miller, ortho  PM&R Procedures:  arthoplasty bipolar hip 8/11 Antibiotics:  Levofloxacin 8/12 >>    Discharge Exam: Filed Vitals:   08/19/13 0800  BP:   Pulse:   Temp:   Resp: 18   Filed Vitals:   08/18/13 2131 08/19/13 0521 08/19/13 0800 08/19/13 0854   BP:  110/61    Pulse:  92    Temp:  99 F (37.2 C)    TempSrc:  Oral    Resp:   18   Height:      Weight:      SpO2: 100% 98%  91%    General:  WF, No acute distress  HEENT: NCAT, MMM  Cardiovascular: IRRR, nl S1, S2 no mrg, 2+ pulses, warm extremities  Respiratory:  Diminished bilateral BS, no obvious wheezes, rales, or rhonchi, no increased WOB  Abdomen: NABS, soft, NT/ND  MSK: Normal tone and bulk, no LEE, left lateral hip dressing c/d/i, no obvious ecchymoses or hematoma Neuro: Grossly intact   Discharge Instructions      Discharge Instructions   (HEART FAILURE PATIENTS) Call MD:  Anytime you have any of the following symptoms: 1) 3 pound weight gain in 24 hours or 5 pounds in 1 week 2) shortness of breath, with or without a dry hacking cough 3) swelling in the hands, feet or stomach 4) if you have to sleep on extra pillows at night in order to breathe.    Complete by:  As directed      Call MD for:  difficulty breathing, headache or visual disturbances    Complete by:  As directed      Call MD for:  extreme fatigue    Complete by:  As directed      Call MD for:  hives    Complete by:  As directed      Call MD for:  persistant dizziness or light-headedness    Complete by:  As directed      Call MD for:  persistant nausea and vomiting    Complete by:  As directed      Call MD for:  redness, tenderness, or signs of infection (pain, swelling, redness, odor or green/yellow discharge around incision site)    Complete by:  As directed      Call MD for:  severe uncontrolled pain    Complete by:  As directed      Call MD for:  temperature >100.4    Complete by:  As directed      Diet general    Complete by:  As directed      Increase activity slowly    Complete by:  As directed      Posterior total hip precautions    Complete by:  As directed      Weight bearing as tolerated    Complete by:  As directed             Medication List    STOP taking these  medications       traMADol 50 MG tablet  Commonly known as:  ULTRAM      TAKE these medications       beclomethasone 40 MCG/ACT inhaler  Commonly known as:  QVAR  Inhale 2 puffs into the lungs 2 (two) times daily as needed (breathing, shortness of breath).     docusate sodium 100 MG capsule  Commonly known as:  COLACE  Take  1 capsule (100 mg total) by mouth 2 (two) times daily. Continue this while taking narcotics to help with bowel movements     HYDROcodone-acetaminophen 5-325 MG per tablet  Commonly known as:  NORCO  Take 1-2 tablets by mouth every 4 (four) hours as needed for moderate pain.     levofloxacin 750 MG tablet  Commonly known as:  LEVAQUIN  Take 1 tablet (750 mg total) by mouth every other day.  Start taking on:  08/20/2013     lidocaine 5 %  Commonly known as:  LIDODERM  Place 1 patch onto the skin daily.     metoprolol succinate 25 MG 24 hr tablet  Commonly known as:  TOPROL-XL  Take 25 mg by mouth 2 (two) times daily.     nitroGLYCERIN 0.4 MG SL tablet  Commonly known as:  NITROSTAT  Place 1 tablet (0.4 mg total) under the tongue every 5 (five) minutes as needed for chest pain.     polyethylene glycol packet  Commonly known as:  MIRALAX / GLYCOLAX  Take 17 g by mouth daily.     pravastatin 40 MG tablet  Commonly known as:  PRAVACHOL  Take 1 tablet (40 mg total) by mouth daily. Once daily     TRAVATAN Z 0.004 % Soln ophthalmic solution  Generic drug:  Travoprost (BAK Free)  Place 1 drop into both eyes daily.     vitamin B-12 100 MCG tablet  Commonly known as:  CYANOCOBALAMIN  Take 100 mcg by mouth once a week.     Vitamin D3 1000 UNITS Caps  Take 2 capsules by mouth daily.     warfarin 3 MG tablet  Commonly known as:  COUMADIN  1 tablet daily except 1.5 tablets on Thursdays or as directed by coumadin clinic       Follow-up Information   Follow up with MURPHY, TIMOTHY, D, MD In 2 weeks.   Specialty:  Orthopedic Surgery   Contact  information:   [address] [place] [address] [phone]       Follow up with SHAW,KIMBERLEE, MD. Schedule an appointment as soon as possible for a visit in 1 month.   Specialty:  Family Medicine   Contact information:   [address] [place] [address] [phone]        The results of significant diagnostics from this hospitalization (including imaging, microbiology, ancillary and laboratory) are listed below for reference.    Significant Diagnostic Studies: Dg Chest 1 View  08/15/2013   CLINICAL DATA:  80 year old female with hip fracture. Preoperative respiratory examination. Fall with left-sided pain.  EXAM: CHEST - 1 VIEW  COMPARISON:  04/06/2013  FINDINGS: Cardiomegaly and left-sided pacemaker again noted.  There is no evidence of focal airspace disease, pulmonary edema, suspicious pulmonary nodule/mass, pleural effusion, or pneumothorax. No acute bony abnormalities are identified.  IMPRESSION: Cardiomegaly without evidence of active cardiopulmonary disease.   Electronically Signed   By: Hassan Rowan M.D.   On: 08/15/2013 15:56   Dg Hip Complete Left  08/15/2013   CLINICAL DATA:  Hip pain.  Fell.  EXAM: LEFT HIP - COMPLETE 2+ VIEW  COMPARISON:  None.  FINDINGS: There is an impacted subcapital LEFT hip fracture. Osteopenia is noted. No pelvic fractures are seen. There is moderate stool burden. Buttock granuloma.  IMPRESSION: Impacted subcapital LEFT hip fracture.   Electronically Signed   By: Rolla Flatten M.D.   On: 08/15/2013 15:56   Dg Pelvis  Portable  08/17/2013   CLINICAL DATA:  Postop left total hip arthroplasty.  EXAM: PORTABLE PELVIS 1-2 VIEWS  COMPARISON:  CT 04/06/2013  FINDINGS: Exam demonstrates mild diffuse decreased bone mineralization. There are mild degenerative changes of the right hip. There is evidence of patient's left total hip arthroplasty which appears adequately located and intact. Remainder of the exam is unchanged.   IMPRESSION: Recent left total hip arthroplasty intact.  Mild degenerative change right hip.   Electronically Signed   By: Marin Olp M.D.   On: 08/17/2013 09:43   Dg Chest Port 1 View  08/18/2013   CLINICAL DATA:  Shortness of breath following recent left hip arthroplasty. Fever.  EXAM: PORTABLE CHEST - 1 VIEW  COMPARISON:  08/15/2013 and 04/06/2013.  FINDINGS: 0739 hr. Left subclavian AV sequential pacemaker leads are unchanged within the right atrium and right ventricle. There is stable mild cardiomegaly and aortic atherosclerosis. There are lower lung volumes with resulting patchy bibasilar opacities, likely atelectasis. In this clinical setting, early pneumonia cannot be excluded although there is no consolidation. There is no pleural effusion or pneumothorax. The osseous structures appear unchanged.  IMPRESSION: New patchy bibasilar opacities, likely atelectasis.   Electronically Signed   By: Camie Patience M.D.   On: 08/18/2013 08:28    Microbiology: Recent Results (from the past 240 hour(s))  MRSA PCR SCREENING     Status: None   Collection Time    08/16/13  6:49 AM      Result Value Ref Range Status   MRSA by PCR NEGATIVE  NEGATIVE Final   Comment:            The GeneXpert MRSA Assay (FDA     approved for NASAL specimens     only), is one component of a     comprehensive MRSA colonization     surveillance program. It is not     intended to diagnose MRSA     infection nor to guide or     monitor treatment for     MRSA infections.  CULTURE, BLOOD (ROUTINE X 2)     Status: None   Collection Time    08/18/13  6:10 PM      Result Value Ref Range Status   Specimen Description BLOOD RIGHT ARM   Final   Special Requests BOTTLES DRAWN AEROBIC ONLY 10CC   Final   Culture  Setup Time     Final   Value: 08/18/2013 22:25     Performed at [facility]   Culture     Final   Value:        BLOOD CULTURE RECEIVED NO GROWTH TO DATE CULTURE WILL BE HELD FOR 5 DAYS BEFORE ISSUING A FINAL  NEGATIVE REPORT     Performed at [facility]   Report Status PENDING   Incomplete  CULTURE, BLOOD (ROUTINE X 2)     Status: None   Collection Time    08/18/13  6:16 PM      Result Value Ref Range Status   Specimen Description BLOOD LEFT HAND   Final   Special Requests BOTTLES DRAWN AEROBIC ONLY 5CC   Final   Culture  Setup Time     Final   Value: 08/18/2013 22:25     Performed at [facility]   Culture     Final   Value:        BLOOD CULTURE RECEIVED NO GROWTH TO DATE CULTURE WILL BE HELD FOR 5  DAYS BEFORE ISSUING A FINAL NEGATIVE REPORT     Performed at [facility]   Report Status PENDING   Incomplete     Labs: Basic Metabolic Panel:  Recent Labs Lab 08/15/13 1603 08/18/13 0607  NA 141 141  K 4.1 3.4*  CL 103 101  CO2 28 29  GLUCOSE 124* 134*  BUN 9 4*  CREATININE 0.63 0.55  CALCIUM 9.0 8.5   Liver Function Tests:  Recent Labs Lab 08/18/13 0607  AST 24  ALT 23  ALKPHOS 75  BILITOT 1.0  PROT 5.4*  ALBUMIN 2.5*   No results found for this basename: LIPASE, AMYLASE,  in the last 168 hours No results found for this basename: AMMONIA,  in the last 168 hours CBC:  Recent Labs Lab 08/15/13 1603 08/16/13 0537 08/18/13 0607 08/19/13 0659  WBC 6.4 6.4 8.5 8.0  NEUTROABS 4.8  --   --   --   HGB 13.7 13.2 11.3* 9.6*  HCT 42.7 42.0 35.5* 29.5*  MCV 89.0 88.1 89.2 87.3  PLT 198 180 152 146*   Cardiac Enzymes: No results found for this basename: CKTOTAL, CKMB, CKMBINDEX, TROPONINI,  in the last 168 hours BNP: BNP (last 3 results) No results found for this basename: PROBNP,  in the last 8760 hours CBG: No results found for this basename: GLUCAP,  in the last 168 hours  Time coordinating discharge: 35  minutes  Signed:  Eleuterio Dollar  [facility] 08/19/2013, 10:11 AM

## 2013-08-19 NOTE — Progress Notes (Addendum)
ANTICOAGULATION/ANTIBIOTIC CONSULT NOTE   Pharmacy Consult for Coumadin and Levaquin Indication: atrial fibrillation and CAP  Allergies  Allergen Reactions  . Betadine [Povidone Iodine]     burning  . Codeine Nausea And Vomiting    Passed out    Patient Measurements: Height: 5\' 2"  (157.5 cm) Weight: 134 lb 1.6 oz (60.827 kg) IBW/kg (Calculated) : 50.1  Vital Signs: Temp: 99 F (37.2 C) (08/13 0521) Temp src: Oral (08/13 0521) BP: 110/61 mmHg (08/13 0521) Pulse Rate: 92 (08/13 0521)  Labs:  Recent Labs  08/17/13 0424 08/18/13 0103 08/18/13 0607 08/19/13 0659  HGB  --   --  11.3* 9.6*  HCT  --   --  35.5* 29.5*  PLT  --   --  152 146*  LABPROT 16.5*  --  17.4* 16.9*  INR 1.33  --  1.42 1.37  HEPARINUNFRC  --  0.17* 0.29*  --   CREATININE  --   --  0.55  --     Estimated Creatinine Clearance: 45.8 ml/min (by C-G formula based on Cr of 0.55).  Assessment: Admit Complaint: mechanical fall with L hip pain (L displaced femoral neck fx). Pt s/p hip arthroplasty 8/11.  Anticoagulation:  Pt on coumadin PTA for afib. Reversed for surgery with total Vit K dose 15mg  (given 8/9-8/10). Coumadin restarted 8/11 post-op. INR 1.37 - no real movement on 4.5mg  x 2 doses. Pt also on Lovenox DVT prophylaxis dose. Will likely take a while to overcome vit K. Noted pt started on levaquin today which will potentiate effects of coumadin. Home dose 3mg  daily except for 4.5mg  on Thur. INR on admit = 3 Educated on coumadin 8/13  ID: Levaquin Day #2 for possible CAP. Pt with fevers post-op. Tm 101.2. WBC wnl. Bld cx - ngtd.  Cardiovascular: HTN, HLD, paf, SSS (s/p pacemaker). VSS. Metoprolol, statin.  Endocrinology: no issues  Neurology: A&O  Nephrology: 8/12 SCr stable. K 3.4- MD replaced  Pulmonary: asthma. RA. Flovent (Qvar PTA).  Hematology / Oncology: Hgb down a bit - likely ABLA. plt ok  PTA Medication Issues:  none  Best Practices: Enox 40, coumadin   Goal of Therapy:   INR 2-3 Monitor platelets by anticoagulation protocol: Yes Resolution of infection   Plan:  1. Coumadin 6mg  today 2. Daily INR 3. Continue Lovenox 40mg  daily until INR >/= 2  4. Levaquin 750mg  IV q48h.  5. Will f/u micro data, pt's clinical condition, renal function  Sherlon Handing, PharmD, BCPS Clinical pharmacist, pager [phone] 08/19/2013,1:23 PM

## 2013-08-19 NOTE — Progress Notes (Signed)
Pt to be admitted to inpt rehab today. Patient and son in agreement.I discussed with orthopedic CM, Renee, and have approval. Dr. Sheran Fava in agreement and I will arrange for today. [phone]

## 2013-08-19 NOTE — Progress Notes (Signed)
PMR Admission Coordinator Pre-Admission Assessment  Patient: Anna Farrell is an 80 y.o., female  MRN: [id number]  DOB: 06-25-1927  Height: 5\' 2"  (157.5 cm)  Weight: 60.827 kg (134 lb 1.6 oz)  Insurance Information  HMO: PPO: PCP: IPA: 80/20: yes OTHER: no hmo  PRIMARY: Medicare a and b Policy#: [id number] Subscriber: pt  CM Name Geronimo Boot, RN Raliegh Ip 's office is the RN CM for 90 days postop for the orthopedic Bundle program began 07/07/13. Phone [phone]  Eff. Date: 11/08/94 Deduct: $1260 Out of Pocket Max: none Life Max: none  CIR: 100% SNF: 20 full days  Outpatient: 80% Co-Pay: 20%  Home Health: 100% Co-Pay: none  DME: 80% Co-Pay: 20%  Providers: pt choice   SECONDARY: [employer] of [place] Policy#: [id number] Subscriber: pt  Medicaid Application Date: Case Manager:  Disability Application Date: Case Worker:  Emergency Contact Information    Contact Information     Name  Relation  Home  Work  Mobile     Casteneda,Chris  Son  [phone]   [phone]     Scherrie Bateman  Daughter  [phone]   [phone]     Adella Hare     [phone]        Current Medical History  Patient Admitting Diagnosis:Left femoral neck fracture status post hemiarthroplasty  History of Present Illness: Anna Farrell is a 80 y.o. female with history of SSS, A fib with PPM, orthostatic hypotension, OSA (new to CPAP); who slipped and fell while in [place] on on 08/15/13 with subsequent displaced left femoral neck fracture.  She was transferred to [facility] per family request and was evaluated by Dr. Alain Marion. Chronic coumadin reversed and patient underwent left hip bipolar hemi on 08/17/13. Post op is WBAT and coumadin resumed. PT evaluation done today.  Trouble voiding with I and O cath today. Plan to wean O2 as tolerated. Continue antibiotics through 08/24/13. Follow up needed pending urine and blood cultures. Postop fever due to CAUTI and possible CAO on 08/18/13. CXR with bibasilar opacities c/w  atelectasis vs. Acute CHF. She was not given lasix due to low blood pressures. She began on Levofloxacin.  Past Medical History    Past Medical History    Diagnosis  Date    .  Vasovagal syncope     .  Hypertension     .  Hyperlipidemia     .  Orthostatic hypotension     .  Osteoporosis     .  Pacemaker  10/11/06      implanted by Dr Leonia Reeves for pauses > 4 seconds with afib    .  Iron deficiency anemia     .  Shortness of breath     .  Arthritis     .  Colitis  5/13    .  Chronic low back pain  2008      crushed vertebrae    .  Tachycardia-bradycardia syndrome     .  Permanent atrial fibrillation     .  Asymptomatic PVCs     .  History of colonic polyps     .  Pure hypercholesterolemia     .  Extrinsic asthma, unspecified       LOV Dr Joya Gaskins 7/12    .  Long term (current) use of anticoagulants     .  Cholelithiasis     .  Generalized osteoarthrosis, unspecified site      Family History  family history includes Hypertension in her  mother.  Prior Rehab/Hospitalizations: none  Current Medications  Current facility-administered medications:acetaminophen (TYLENOL) suppository 650 mg, 650 mg, Rectal, Q6H PRN, Renette Butters, MD; acetaminophen (TYLENOL) tablet 650 mg, 650 mg, Oral, Q6H PRN, Renette Butters, MD, 650 mg at 08/19/13 0910; bisacodyl (DULCOLAX) suppository 10 mg, 10 mg, Rectal, Once, Janece Canterbury, MD; cholecalciferol (VITAMIN D) tablet 2,000 Units, 2,000 Units, Oral, Daily, Orson Eva, MD, 2,000 Units at 08/19/13 1115  docusate sodium (COLACE) capsule 100 mg, 100 mg, Oral, BID, Janece Canterbury, MD, 100 mg at 08/19/13 1115; enoxaparin (LOVENOX) injection 40 mg, 40 mg, Subcutaneous, Daily, Janece Canterbury, MD; feeding supplement (ENSURE COMPLETE) (ENSURE COMPLETE) liquid 237 mL, 237 mL, Oral, BID BM, Baird Lyons, RD, 237 mL at 08/19/13 1000  feeding supplement (RESOURCE BREEZE) (RESOURCE BREEZE) liquid 1 Container, 1 Container, Oral, BID, Baird Lyons, RD, 1  Container at 08/19/13 0800; fluticasone (FLOVENT HFA) 44 MCG/ACT inhaler 2 puff, 2 puff, Inhalation, BID, Orson Eva, MD, 2 puff at 08/19/13 [phone]; HYDROcodone-acetaminophen (NORCO/VICODIN) 5-325 MG per tablet 1-2 tablet, 1-2 tablet, Oral, Q6H PRN, Renette Butters, MD, 2 tablet at 08/19/13 1124  levofloxacin (LEVAQUIN) IVPB 750 mg, 750 mg, Intravenous, Q48H, Juanda Chance Amend, RPH, 750 mg at 08/18/13 1403; menthol-cetylpyridinium (CEPACOL) lozenge 3 mg, 1 lozenge, Oral, PRN, Renette Butters, MD; metoCLOPramide (REGLAN) injection 5-10 mg, 5-10 mg, Intravenous, Q8H PRN, Renette Butters, MD; metoCLOPramide (REGLAN) tablet 5-10 mg, 5-10 mg, Oral, Q8H PRN, Renette Butters, MD  metoprolol succinate (TOPROL-XL) 24 hr tablet 25 mg, 25 mg, Oral, BID, Orson Eva, MD, 25 mg at 08/19/13 1114; morphine 2 MG/ML injection 2 mg, 2 mg, Intravenous, Q2H PRN, Orson Eva, MD, 2 mg at 08/18/13 0536; ondansetron (ZOFRAN) injection 4 mg, 4 mg, Intravenous, Q6H PRN, Renette Butters, MD; ondansetron (ZOFRAN) tablet 4 mg, 4 mg, Oral, Q6H PRN, Renette Butters, MD  oxyCODONE (Oxy IR/ROXICODONE) immediate release tablet 5 mg, 5 mg, Oral, Q4H PRN, Reyne Dumas, MD, 5 mg at 08/19/13 0910; phenol (CHLORASEPTIC) mouth spray 1 spray, 1 spray, Mouth/Throat, PRN, Renette Butters, MD; polyethylene glycol (MIRALAX / GLYCOLAX) packet 17 g, 17 g, Oral, Daily, Janece Canterbury, MD, 17 g at 08/19/13 1115; simvastatin (ZOCOR) tablet 20 mg, 20 mg, Oral, q1800, Orson Eva, MD, 20 mg at 08/18/13 1851  vitamin B-12 (CYANOCOBALAMIN) tablet 100 mcg, 100 mcg, Oral, Weekly, Orson Eva, MD, 100 mcg at 08/15/13 2021; Warfarin - Pharmacist Dosing Inpatient, , Does not apply, q1800, Sindy Guadeloupe, RPH  Patients Current Diet: General Pt has has loss of 30 lbs since pancreatitis a few months ago.  Precautions / Restrictions  Precautions  Precautions: Posterior Hip;Fall  Precaution Booklet Issued: Yes (comment)  Precaution Comments: pt verbalized 3/3 hip  precautions.  Restrictions  Weight Bearing Restrictions: Yes  LLE Weight Bearing: Weight bearing as tolerated  Prior Activity Level  Limited Community (1-2x/wk): church weekly; drives self; uses Transport planner for community rarely  Pt has chronic back pain and history of using tramadol 50 mg 2 BID. Slow gait without AD. Uses electric scooter when at [employer] of a lot of distance in community. Drives self to church and does not use scooter weekly.  Her grand daughter is Azara Gemme PTA, former therapist on acute hospital.  [place] Devices/Equipment: Raised toilet seat with rails;Eyeglasses;Walker (specify type);Other (Comment) (bedside commod)  Prior Functional Level  Prior Function  Level of Independence: Independent  Comments: 2x/week cleaning lady  Current Functional Level  Cognition  Overall Cognitive Status: Within Functional Limits for tasks assessed  Orientation Level: Oriented X4    Extremity Assessment  (includes Sensation/Coordination)      ADLs  Overall ADL's : Needs assistance/impaired  Eating/Feeding: Independent;Sitting  Grooming: Set up;Supervision/safety;Sitting  Grooming Details (indicate cue type and reason): sitting EOB  Upper Body Bathing: Set up;Sitting  Lower Body Bathing: Moderate assistance;Sit to/from stand;+2 for physical assistance  Upper Body Dressing : Set up;Sitting  Lower Body Dressing: Maximal assistance;Sit to/from stand;+2 for physical assistance  Toilet Transfer: Moderate assistance;+2 for physical assistance  Toileting- Clothing Manipulation and Hygiene: Maximal assistance;+2 for physical assistance;Sit to/from stand  Tub/ Banker: Total assistance  Functional mobility during ADLs: Moderate assistance;+2 for physical assistance;Rolling walker  General ADL Comments: Pt requires max encouragement to perform as much mobility as possible for herself. Pt performed bed mobility with assist and transferred  to [facility] then returned to bed. Pt performed therapeutic exercise sitting EOB. Pt has difficulty with advancing RLE forward during ambulation due to her pain and hesitancy with WB through her LLE.    Mobility  Overal bed mobility: Needs Assistance  Bed Mobility: Supine to Sit;Sit to Supine  Supine to sit: Mod assist;+2 for physical assistance  Sit to supine: Mod assist;+2 for physical assistance  General bed mobility comments: A with L LE and trunk when coming to sitting and returning to supine.    Transfers  Overall transfer level: Needs assistance  Equipment used: Rolling walker (2 wheeled)  Transfers: Sit to/from Stand  Sit to Stand: Min assist;+2 physical assistance  Stand pivot transfers: Mod assist;+2 physical assistance  General transfer comment: pt better able to A with coming to standing today. cues for anterior weight shifting with coming to standing.    Ambulation / Gait / Stairs / Wheelchair Mobility  Ambulation/Gait  Ambulation/Gait assistance: Min assist;+2 physical assistance  Ambulation Distance (Feet): 6 Feet (x2)  Assistive device: Rolling walker (2 wheeled)  Gait Pattern/deviations: Step-to pattern;Decreased step length - right;Decreased stance time - left;Decreased stride length;Trunk flexed  Gait velocity interpretation: Below normal speed for age/gender  General Gait Details: pt still needs increased time to advance R LE and WB on L LE, but improved today from yesterday.    Posture / Balance     Special needs/care consideration  Oxygen O2 at 2 liters [place]  Bowel mgmt: continent  Bladder mgmt: urinary retention. I and O cath this am    Previous Home Environment  Living Arrangements: Alone  Lives With: Alone  Available Help at Discharge: Family;Available PRN/intermittently  Type of Home: House  Home Layout: One level  Home Access: Stairs to enter  [employer] of Steps: 3  Bathroom Shower/Tub: Administrator, Civil Service: Standard  Bathroom  Accessibility: Yes  How Accessible: Accessible via walker;Other (comment) (has rails in bathrrom; has raised bath seat in bathroom for )  Home Care Services: No  Additional Comments: Pt does not use AD, walks slowly and short distance throughtou her home. Chronic back issues with pain  Discharge Living Setting  Plans for Discharge Living Setting: Patient's home;Alone  Type of Home at Discharge: House  Discharge Home Layout: One level  Discharge Home Access: Stairs to enter  Discharge Bathroom Shower/Tub: Tub/shower unit;Other (comment) (with handicapped bars and raised bath seat for ease in trans)  Discharge Bathroom Toilet: Standard  Discharge Bathroom Accessibility: Yes  How Accessible: Accessible via walker  Does the patient have any problems obtaining your medications?: No  Social/Family/Support Systems  Patient Roles: Parent  Contact Information: Jadamarie Butson and Scherrie Bateman; son and dtr  Anticipated Caregiver: family intermittently  Anticipated Caregiver's Contact Information: see above  Ability/Limitations of Caregiver: family work. They plan to assist with meals, coming by daily to assist until pt Mod I  Caregiver Availability: Intermittent  Discharge Plan Discussed with Primary Caregiver: Yes  Is Caregiver In Agreement with Plan?: Yes  Does Caregiver/Family have Issues with Lodging/Transportation while Pt is in Rehab?: No  Goals/Additional Needs  Patient/Family Goal for Rehab: Mod I with PT and OT  Expected length of stay: [facility] 7 days  Dietary Needs: regular;  Special Service Needs: Pt is followed by RN CM at [facility] office, Renee Anguili. for 90 days postop  Additional Information: phone [phone]. Plan for d/c home with [facility] after CIR, SNF if fails to progress; this is a "bundle" orthopedic pt so financially managed as Payor is the orthopedic Medicare bundle  Pt/Family Agrees to Admission and willing to participate: Yes  Program Orientation Provided & Reviewed with  Pt/Caregiver Including Roles & Responsibilities: Yes  Decrease burden of Care through IP rehab admission: n/a  Possible need for SNF placement upon discharge:not anticipated, but per RN CM , Renee A., if she fails to progress to expected level at d/c they would consider SNF short term. I spoke with son, Gerald Stabs, to emphasize that CM would prefer pt to go directly home from [place]. Family will need to assist daily for food prep, etc. But 24/7 supervision is not expected.  Patient Condition: This patient's condition remains as documented in the consult dated 08/18/2013, in which the Rehabilitation Physician determined and documented that the patient's condition is appropriate for intensive rehabilitative care in an inpatient rehabilitation facility. Will admit to inpatient rehab today.  Preadmission Screen Completed By: Cleatrice Burke, 08/19/2013 1:24 PM  ______________________________________________________________________  Discussed status with Dr. Letta Pate on 08/19/2013 at 1323 and received telephone approval for admission today.  Admission Coordinator: Cleatrice Burke, time 1323 Date 08/19/2013.    Cosigned by: Charlett Blake, MD [08/19/2013 1:39 PM]

## 2013-08-19 NOTE — Progress Notes (Signed)
Patient information reviewed and entered into eRehab system by Rex Oesterle, RN, CRRN, PPS Coordinator.  Information including medical coding and functional independence measure will be reviewed and updated through discharge.     Per nursing patient was given "Data Collection Information Summary for Patients in Inpatient Rehabilitation Facilities with attached "Privacy Act Statement-Health Care Records" upon admission.  

## 2013-08-19 NOTE — PMR Pre-admission (Signed)
PMR Admission Coordinator Pre-Admission Assessment  Patient: Anna Farrell is an 80 y.o., female MRN: [id number] DOB: 08/21/1927 Height: 5\' 2"  (157.5 cm) Weight: 60.827 kg (134 lb 1.6 oz)              Insurance Information HMO:     PPO:      PCP:      IPA:      80/20: yes     OTHER: no hmo PRIMARY: Medicare a and b      Policy#: [id number]      Subscriber: pt CM Name Geronimo Boot, RN Raliegh Ip 's office is the RN CM for 90 days postop for the orthopedic Bundle program began 07/07/13. Phone [phone]   Eff. Date: 11/08/94     Deduct: $1260      Out of Pocket Max: none      Life Max: none CIR: 100%      SNF: 20 full days Outpatient: 80%     Co-Pay: 20% Home Health: 100%      Co-Pay: none DME: 80%     Co-Pay: 20% Providers: pt choice  SECONDARY: BCBS of       Policy#: [id number]      Subscriber: pt Medicaid Application Date:       Case Manager:  Disability Application Date:       Case Worker:   Emergency Contact Information Contact Information   Name Relation Home Work Mobile   Anna Farrell,Anna Farrell Son [phone]  [phone]   Anna Farrell Daughter [phone]  [phone]   Anna Farrell    [phone]     Current Medical History  Patient Admitting Diagnosis:Left femoral neck fracture status post hemiarthroplasty  History of Present Illness: Anna Farrell is a 80 y.o. female with history of SSS, A fib with PPM, orthostatic hypotension, OSA (new to CPAP); who slipped and fell while in [place] on on 08/15/13 with subsequent displaced left femoral neck fracture.   She was transferred to [facility] per family request and was evaluated by Dr. Alain Marion. Chronic coumadin reversed and patient underwent left hip bipolar hemi on 08/17/13. Post op is WBAT and coumadin resumed. PT evaluation done today.   Trouble voiding with I and O cath today. Plan to wean O2 as tolerated. Continue antibiotics through 08/24/13. Follow up needed pending urine and blood cultures. Postop fever due to CAUTI  and possible CAO on 08/18/13. CXR with bibasilar opacities c/w atelectasis vs. Acute CHF. She was not given lasix due to low blood pressures. She began on Levofloxacin.   Past Medical History  Past Medical History  Diagnosis Date  . Vasovagal syncope   . Hypertension   . Hyperlipidemia   . Orthostatic hypotension   . Osteoporosis   . Pacemaker 10/11/06    implanted by Dr Leonia Reeves for pauses > 4 seconds with afib  . Iron deficiency anemia   . Shortness of breath   . Arthritis   . Colitis 5/13  . Chronic low back pain 2008    crushed vertebrae  . Tachycardia-bradycardia syndrome   . Permanent atrial fibrillation   . Asymptomatic PVCs   . History of colonic polyps   . Pure hypercholesterolemia   . Extrinsic asthma, unspecified     LOV Dr Joya Gaskins 7/12  . Long term (current) use of anticoagulants   . Cholelithiasis   . Generalized osteoarthrosis, unspecified site     Family History  family history includes Hypertension in her mother.  Prior Rehab/Hospitalizations: none  Current Medications  Current  facility-administered medications:acetaminophen (TYLENOL) suppository 650 mg, 650 mg, Rectal, Q6H PRN, Anna Butters, MD;  acetaminophen (TYLENOL) tablet 650 mg, 650 mg, Oral, Q6H PRN, Anna Butters, MD, 650 mg at 08/19/13 0910;  bisacodyl (DULCOLAX) suppository 10 mg, 10 mg, Rectal, Once, Anna Canterbury, MD;  cholecalciferol (VITAMIN D) tablet 2,000 Units, 2,000 Units, Oral, Daily, Anna Eva, MD, 2,000 Units at 08/19/13 1115 docusate sodium (COLACE) capsule 100 mg, 100 mg, Oral, BID, Anna Canterbury, MD, 100 mg at 08/19/13 1115;  enoxaparin (LOVENOX) injection 40 mg, 40 mg, Subcutaneous, Daily, Anna Canterbury, MD;  feeding supplement (ENSURE COMPLETE) (ENSURE COMPLETE) liquid 237 mL, 237 mL, Oral, BID BM, Anna Farrell, RD, 237 mL at 08/19/13 1000 feeding supplement (RESOURCE BREEZE) (RESOURCE BREEZE) liquid 1 Container, 1 Container, Oral, BID, Anna Farrell, RD, 1 Container at  08/19/13 0800;  fluticasone (FLOVENT HFA) 44 MCG/ACT inhaler 2 puff, 2 puff, Inhalation, BID, Anna Eva, MD, 2 puff at 08/19/13 [phone];  HYDROcodone-acetaminophen (NORCO/VICODIN) 5-325 MG per tablet 1-2 tablet, 1-2 tablet, Oral, Q6H PRN, Anna Butters, MD, 2 tablet at 08/19/13 1124 levofloxacin (LEVAQUIN) IVPB 750 mg, 750 mg, Intravenous, Q48H, Anna Farrell, Anna Farrell, 750 mg at 08/18/13 1403;  menthol-cetylpyridinium (CEPACOL) lozenge 3 mg, 1 lozenge, Oral, PRN, Anna Butters, MD;  metoCLOPramide (REGLAN) injection 5-10 mg, 5-10 mg, Intravenous, Q8H PRN, Anna Butters, MD;  metoCLOPramide (REGLAN) tablet 5-10 mg, 5-10 mg, Oral, Q8H PRN, Anna Butters, MD metoprolol succinate (TOPROL-XL) 24 hr tablet 25 mg, 25 mg, Oral, BID, Anna Eva, MD, 25 mg at 08/19/13 1114;  morphine 2 MG/ML injection 2 mg, 2 mg, Intravenous, Q2H PRN, Anna Eva, MD, 2 mg at 08/18/13 0536;  ondansetron (ZOFRAN) injection 4 mg, 4 mg, Intravenous, Q6H PRN, Anna Butters, MD;  ondansetron (ZOFRAN) tablet 4 mg, 4 mg, Oral, Q6H PRN, Anna Butters, MD oxyCODONE (Oxy IR/ROXICODONE) immediate release tablet 5 mg, 5 mg, Oral, Q4H PRN, Anna Dumas, MD, 5 mg at 08/19/13 0910;  phenol (CHLORASEPTIC) mouth spray 1 spray, 1 spray, Mouth/Throat, PRN, Anna Butters, MD;  polyethylene glycol (MIRALAX / GLYCOLAX) packet 17 g, 17 g, Oral, Daily, Anna Canterbury, MD, 17 g at 08/19/13 1115;  simvastatin (ZOCOR) tablet 20 mg, 20 mg, Oral, q1800, Anna Eva, MD, 20 mg at 08/18/13 1851 vitamin B-12 (CYANOCOBALAMIN) tablet 100 mcg, 100 mcg, Oral, Weekly, Anna Eva, MD, 100 mcg at 08/15/13 2021;  Warfarin - Pharmacist Dosing Inpatient, , Does not apply, q1800, Anna Farrell, Anna Farrell  Patients Current Diet: General Pt has has loss of 30 lbs since pancreatitis a few months ago.   Precautions / Restrictions Precautions Precautions: Posterior Hip;Fall Precaution Booklet Issued: Yes (comment) Precaution Comments: pt verbalized 3/3 hip  precautions.   Restrictions Weight Bearing Restrictions: Yes LLE Weight Bearing: Weight bearing as tolerated   Prior Activity Level Limited Community (1-2x/wk): church weekly; drives self; uses Transport planner for community rarely Pt has chronic back pain and history of using tramadol 50 mg 2 BID. Slow gait without AD. Uses electric scooter when at [employer] of a lot of distance in community. Drives self to church and does not use scooter weekly. Her grand daughter is Anna Farrell PTA, former therapist on acute hospital.  [place] Devices/Equipment: Raised toilet seat with rails;Eyeglasses;Walker (specify type);Other (Comment) (bedside commod)  Prior Functional Level Prior Function Level of Independence: Independent Comments: 2x/week cleaning lady  Current Functional Level Cognition  Overall Cognitive Status: Within Functional Limits for  tasks assessed Orientation Level: Oriented X4    Extremity Assessment (includes Sensation/Coordination)          ADLs  Overall ADL's : Needs assistance/impaired Eating/Feeding: Independent;Sitting Grooming: Set up;Supervision/safety;Sitting Grooming Details (indicate cue type and reason): sitting EOB Upper Body Bathing: Set up;Sitting Lower Body Bathing: Moderate assistance;Sit to/from stand;+2 for physical assistance Upper Body Dressing : Set up;Sitting Lower Body Dressing: Maximal assistance;Sit to/from stand;+2 for physical assistance Toilet Transfer: Moderate assistance;+2 for physical assistance Toileting- Clothing Manipulation and Hygiene: Maximal assistance;+2 for physical assistance;Sit to/from stand Tub/ Banker: Total assistance Functional mobility during ADLs: Moderate assistance;+2 for physical assistance;Rolling walker General ADL Comments: Pt requires max encouragement to perform as much mobility as possible for herself. Pt performed bed mobility with assist and transferred to [facility] then  returned to bed. Pt performed therapeutic exercise sitting EOB. Pt has difficulty with advancing RLE forward during ambulation due to her pain and hesitancy with WB through her LLE.    Mobility  Overal bed mobility: Needs Assistance Bed Mobility: Supine to Sit;Sit to Supine Supine to sit: Mod assist;+2 for physical assistance Sit to supine: Mod assist;+2 for physical assistance General bed mobility comments: A with L LE and trunk when coming to sitting and returning to supine.      Transfers  Overall transfer level: Needs assistance Equipment used: Rolling walker (2 wheeled) Transfers: Sit to/from Stand Sit to Stand: Min assist;+2 physical assistance Stand pivot transfers: Mod assist;+2 physical assistance General transfer comment: pt better able to A with coming to standing today.  cues for anterior weight shifting with coming to standing.      Ambulation / Gait / Stairs / Wheelchair Mobility  Ambulation/Gait Ambulation/Gait assistance: Min assist;+2 physical assistance Ambulation Distance (Feet): 6 Feet (x2) Assistive device: Rolling walker (2 wheeled) Gait Pattern/deviations: Step-to pattern;Decreased step length - right;Decreased stance time - left;Decreased stride length;Trunk flexed Gait velocity interpretation: Below normal speed for age/gender General Gait Details: pt still needs increased time to advance R LE and WB on L LE, but improved today from yesterday.      Posture / Balance      Special needs/care consideration Oxygen O2 at 2 liters [place] Bowel mgmt: continent Bladder mgmt: urinary retention. I and O cath this am    Previous Home Environment Living Arrangements: Alone  Lives With: Alone Available Help at Discharge: Family;Available PRN/intermittently Type of Home: House Home Layout: One level Home Access: Stairs to enter [employer] of Steps: 3 Bathroom Shower/Tub: Chiropodist: Standard Bathroom Accessibility: Yes How  Accessible: Accessible via walker;Other (comment) (has rails in bathrrom; has raised bath seat in bathroom for ) Home Care Services: No Additional Comments: Pt does not use AD, walks slowly and short distance throughtou her home. Chronic back issues with pain  Discharge Living Setting Plans for Discharge Living Setting: Patient's home;Alone Type of Home at Discharge: House Discharge Home Layout: One level Discharge Home Access: Stairs to enter Discharge Bathroom Shower/Tub: Tub/shower unit;Other (comment) (with handicapped bars and raised bath seat for ease in trans) Discharge Bathroom Toilet: Standard Discharge Bathroom Accessibility: Yes How Accessible: Accessible via walker Does the patient have any problems obtaining your medications?: No  Social/Family/Support Systems Patient Roles: Parent Contact Information: Anna Farrell and Anna Farrell; son and dtr Anticipated Caregiver: family intermittently Anticipated Caregiver's Contact Information: see above Ability/Limitations of Caregiver: family work. They plan to assist with meals, coming by daily to assist until pt Mod I Caregiver Availability: Intermittent Discharge Plan Discussed with Primary  Caregiver: Yes Is Caregiver In Agreement with Plan?: Yes Does Caregiver/Family have Issues with Lodging/Transportation while Pt is in Rehab?: No    Goals/Additional Needs Patient/Family Goal for Rehab: Mod I with PT and OT Expected length of stay: ELOS 7 days Dietary Needs: regular;  Special Service Needs: Pt is followed by RN CM at [facility] office, Anna Farrell. for 90 days postop Additional Information: phone [phone]. Plan for d/c home with [facility] after CIR, SNF if fails to progress; this is a "bundle" orthopedic pt so financially managed as Payor is the orthopedic Medicare bundle  Pt/Family Agrees to Admission and willing to participate: Yes Program Orientation Provided & Reviewed with Pt/Caregiver Including Roles  &  Responsibilities: Yes   Decrease burden of Care through IP rehab admission: n/a  Possible need for SNF placement upon discharge:not anticipated, but per RN CM , Anna A., if she fails to progress to expected level at d/c they would consider SNF short term. I spoke with son, Anna Farrell, to emphasize that CM would prefer pt to go directly home from [place]. Family will need to assist daily for food prep, etc. But 24/7 supervision is not expected.  Patient Condition: This patient's condition remains as documented in the consult dated 08/18/2013, in which the Rehabilitation Physician determined and documented that the patient's condition is appropriate for intensive rehabilitative care in an inpatient rehabilitation facility. Will admit to inpatient rehab today.  Preadmission Screen Completed By:  Anna Farrell, 08/19/2013 1:24 PM ______________________________________________________________________   Discussed status with Dr. Letta Pate on 08/19/2013 at  1323  and received telephone approval for admission today.  Admission Coordinator:  Anna Farrell, time 1323 Date 08/19/2013.

## 2013-08-19 NOTE — Progress Notes (Signed)
Patient ID: Anna Farrell, female   DOB: 11-06-1927, 80 y.o.   MRN: [id number]     Subjective:  Patient reports pain as mild to moderate.  Patient states that she is getting better and that her neck feels better and she is ready to go to rehab  Objective:   VITALS:   Filed Vitals:   08/18/13 1759 08/18/13 2041 08/18/13 2131 08/19/13 0521  BP:  133/80  110/61  Pulse:  106  92  Temp: 100.9 F (38.3 C) 99.3 F (37.4 C)  99 F (37.2 C)  TempSrc: Oral   Oral  Resp:      Height:      Weight:      SpO2:  100% 100% 98%    ABD soft Sensation intact distally Dorsiflexion/Plantar flexion intact Incision: dressing C/D/I and no drainage   Lab Results  Component Value Date   WBC 8.5 08/18/2013   HGB 11.3* 08/18/2013   HCT 35.5* 08/18/2013   MCV 89.2 08/18/2013   PLT 152 08/18/2013     Assessment/Plan: 2 Days Post-Op   Active Problems:   Chronic atrial fibrillation   BRADYCARDIA-TACHYCARDIA SYNDROME   Left displaced femoral neck fracture   Hyperlipidemia   Hypokalemia   Acute blood loss anemia   CAP (community acquired pneumonia)   Advance diet Up with therapy Continue plan per medicine wbat Dry dressing PRN Okay for rehab when cleared by medicine   Rande Brunt, Florabelle Cardin 08/19/2013, 7:20 AM   Edmonia Lynch MD [phone]

## 2013-08-19 NOTE — H&P (Signed)
Physical Medicine and Rehabilitation Admission H&P      Chief Complaint   Patient presents with   .  Left displaced femoral neck fracture.      HPI:   Anna Farrell is a 80 y.o. female with history of SSS, A fib with PPM, orthostatic hypotension, Right knee OA, OSA (new to CPAP); who slipped and fell while in [place] on on 08/15/13 with subsequent displaced left femoral neck fracture. She was transferred to [facility] per family request and was evaluated by Dr. Alain Marion. Chronic coumadin reversed and patient underwent left hip bipolar hemi on 08/17/13. Post op is WBAT and coumadin resumed. Had fever 101 last evening and BC drawn and pending. She was started on levaquin for question PNA v/s UTI.  Today with reports of abdominal pain and problems voiding and UA with evidence of pyuria. . MD, family and rehab team requesting CIR for follow up and patient admitted today.      Review of Systems  Constitutional: Positive for weight loss (30 Lbs in past 3-4 months).        Anorexia  HENT: Negative for hearing loss.   Gastrointestinal: Positive for heartburn, abdominal pain (chronic intermittent due to pancreatitits) and constipation.  Genitourinary: Positive for urgency and frequency.        Occasionally leaks. Gets up 3-4 times nightly to urinate.  Musculoskeletal: Positive for back pain and joint pain (chronic right knee pain with occasional instability.).  Neurological: Positive for weakness. Negative for dizziness and headaches.  Psychiatric/Behavioral: Negative for depression. The patient is not nervous/anxious and does not have insomnia.   Past Medical History   Diagnosis  Date   .  Vasovagal syncope     .  Hypertension     .  Hyperlipidemia     .  Orthostatic hypotension     .  Osteoporosis     .  Pacemaker  10/11/06       implanted by Dr Leonia Reeves for pauses > 4 seconds with afib   .  Iron deficiency anemia     .  Shortness of breath     .  Arthritis     .  Colitis  5/13   .   Chronic low back pain  2008       crushed vertebrae   .  Tachycardia-bradycardia syndrome     .  Permanent atrial fibrillation     .  Asymptomatic PVCs     .  History of colonic polyps     .  Pure hypercholesterolemia     .  Extrinsic asthma, unspecified         LOV Dr Joya Gaskins 7/12   .  Long term (current) use of anticoagulants     .  Cholelithiasis     .  Generalized osteoarthrosis, unspecified site         Past Surgical History   Procedure  Laterality  Date   .  Vesicovaginal fistula closure w/ tah    1981   .  Pacemaker insertion    10/11/2006       SJM Zephyr XL DR implanted by Dr Leonia Reeves for afib with pause > 4 seconds   .  Colonoscopy       .  Appendectomy       .  Abdominal hysterectomy       .  Cholecystectomy    09/17/2011       Procedure: LAPAROSCOPIC CHOLECYSTECTOMY;  Surgeon: Rolm Bookbinder, MD;  Location: [facility];  Service: General;;   .  Cardiac catheterization    2009       Normal coronary arteries   .  Ercp  N/A  04/07/2013       Procedure: ENDOSCOPIC RETROGRADE CHOLANGIOPANCREATOGRAPHY (ERCP);  Surgeon: Jeryl Columbia, MD;  Location: [facility];  Service: Endoscopy;  Laterality: N/A;   .  Eus    04/07/2013       Procedure: UPPER ENDOSCOPIC ULTRASOUND (EUS) RADIAL;  Surgeon: Jeryl Columbia, MD;  Location: [facility];  Service: Endoscopy;;   .  Hip arthroplasty  Left  08/17/2013       Procedure: ARTHROPLASTY BIPOLAR HIP;  Surgeon: Renette Butters, MD;  Location: [facility];  Service: Orthopedics;  Laterality: Left;       Family History   Problem  Relation  Age of Onset   .  Hypertension  Mother        Social History: Lives alone. Independent without AD but has been deconditioned due to multiple medical issues past few months. Used to [employer] for [employer] till about 10 years ago. Has family in town who can provide some assistance past discharge. She reports that she has never smoked. She has never used smokeless tobacco. She reports that she does not drink alcohol or use  illicit drugs      Allergies   Allergen  Reactions   .  Codeine  Nausea And Vomiting       Passed out    Medications Prior to Admission   Medication  Sig  Dispense  Refill   .  beclomethasone (QVAR) 40 MCG/ACT inhaler  Inhale 2 puffs into the lungs 2 (two) times daily as needed (breathing, shortness of breath).         .  Cholecalciferol (VITAMIN D3) 1000 UNITS CAPS  Take 2 capsules by mouth daily.          Marland Kitchen  lidocaine (LIDODERM) 5 %  Place 1 patch onto the skin daily.          .  metoprolol succinate (TOPROL-XL) 25 MG 24 hr tablet  Take 25 mg by mouth 2 (two) times daily.         .  pravastatin (PRAVACHOL) 40 MG tablet  Take 1 tablet (40 mg total) by mouth daily. Once daily   90 tablet   1   .  traMADol (ULTRAM) 50 MG tablet  Take 50 mg by mouth every 8 (eight) hours as needed. As needed for pain. Two tabs in the morning (100 mg) two tabs in evening (100 mg ) total 200 mg daily         .  TRAVATAN Z 0.004 % SOLN ophthalmic solution  Place 1 drop into both eyes daily.         .  vitamin B-12 (CYANOCOBALAMIN) 100 MCG tablet  Take 100 mcg by mouth once a week.         .  warfarin (COUMADIN) 3 MG tablet  1 tablet daily except 1.5 tablets on Thursdays or as directed by coumadin clinic   105 tablet   1   .  nitroGLYCERIN (NITROSTAT) 0.4 MG SL tablet  Place 1 tablet (0.4 mg total) under the tongue every 5 (five) minutes as needed for chest pain.   90 tablet   3      Home: Home Living Family/patient expects to be discharged to:: Inpatient rehab Living Arrangements: Alone    Functional History: Prior Function Level  of Independence: Independent Comments: 2x/week cleaning lady   Functional Status:   Mobility: Bed Mobility Overal bed mobility: Needs Assistance Bed Mobility: Supine to Sit;Sit to Supine Supine to sit: Mod assist;+2 for physical assistance Sit to supine: Mod assist;+2 for physical assistance General bed mobility comments: A with L LE and trunk when coming to sitting and  returning to supine.   Transfers Overall transfer level: Needs assistance Equipment used: Rolling walker (2 wheeled) Transfers: Sit to/from Stand Sit to Stand: Min assist;+2 physical assistance Stand pivot transfers: Mod assist;+2 physical assistance General transfer comment: pt better able to A with coming to standing today.  cues for anterior weight shifting with coming to standing.   Ambulation/Gait Ambulation/Gait assistance: Min assist;+2 physical assistance Ambulation Distance (Feet): 6 Feet (x2) Assistive device: Rolling walker (2 wheeled) Gait Pattern/deviations: Step-to pattern;Decreased step length - right;Decreased stance time - left;Decreased stride length;Trunk flexed Gait velocity interpretation: Below normal speed for age/gender General Gait Details: pt still needs increased time to advance R LE and WB on L LE, but improved today from yesterday.     ADL: ADL Overall ADL's : Needs assistance/impaired Eating/Feeding: Independent;Sitting Grooming: Set up;Supervision/safety;Sitting Grooming Details (indicate cue type and reason): sitting EOB Upper Body Bathing: Set up;Sitting Lower Body Bathing: Moderate assistance;Sit to/from stand;+2 for physical assistance Upper Body Dressing : Set up;Sitting Lower Body Dressing: Maximal assistance;Sit to/from stand;+2 for physical assistance Toilet Transfer: Moderate assistance;+2 for physical assistance Toileting- Clothing Manipulation and Hygiene: Maximal assistance;+2 for physical assistance;Sit to/from stand Tub/ Banker: Total assistance Functional mobility during ADLs: Moderate assistance;+2 for physical assistance;Rolling walker General ADL Comments: Pt requires max encouragement to perform as much mobility as possible for herself. Pt performed bed mobility with assist and transferred to [facility] then returned to bed. Pt performed therapeutic exercise sitting EOB. Pt has difficulty with advancing RLE forward during ambulation  due to her pain and hesitancy with WB through her LLE.   Cognition: Cognition Overall Cognitive Status: Within Functional Limits for tasks assessed Orientation Level: Oriented X4 Cognition Arousal/Alertness: Awake/alert Behavior During Therapy: WFL for tasks assessed/performed Overall Cognitive Status: Within Functional Limits for tasks assessed   Physical Exam: Blood pressure 110/61, pulse 92, temperature 99 F (37.2 C), temperature source Oral, resp. rate 18, height $RemoveBe'5\' 2"'IndSLTeCE$  (1.575 m), weight 60.827 kg (134 lb 1.6 oz), SpO2 91.00%. Physical Exam  Nursing note and vitals reviewed. Constitutional: She is oriented to person, place, and time. She appears well-developed and well-nourished.  Appears state age.   HENT:   Head: Normocephalic and atraumatic.  Eyes: Pupils are equal, round, and reactive to light.  Neck: Normal range of motion. Neck supple.  Cardiovascular: Normal rate and regular rhythm.   Respiratory: Effort normal. No respiratory distress. She has decreased breath sounds. She has no wheezes.  GI: Soft. Bowel sounds are normal. She exhibits no distension.  Musculoskeletal:  Left hip incision with surgical dressing in place and min edema.   Neurological: She is alert and oriented to person, place, and time.  Speech clear. Follows commands without difficulty. 5/5 bilateral deltoid, bicep, tricep, grip. 2-/5 bilateral hip flexor knee extensor 3 minus bilateral ankle dorsiflexor plantar flexor.  Sensation intact to light touch in bilateral lower extremities     Skin: Skin is warm and dry.  Psychiatric: She has a normal mood and affect. Her behavior is normal. Judgment and thought content normal.     Results for orders placed during the hospital encounter of 08/15/13 (from the past 48 hour(s))  HEPARIN LEVEL (UNFRACTIONATED)     Status: Abnormal     Collection Time      08/18/13  1:03 AM       Result  Value  Ref Range     Heparin Unfractionated  0.17 (*)  0.30 - 0.70  IU/mL     Comment:                IF HEPARIN RESULTS ARE BELOW        EXPECTED VALUES, AND PATIENT        DOSAGE HAS BEEN CONFIRMED,        SUGGEST FOLLOW UP TESTING        OF ANTITHROMBIN III LEVELS.   COMPREHENSIVE METABOLIC PANEL     Status: Abnormal     Collection Time      08/18/13  6:07 AM       Result  Value  Ref Range     Sodium  141   137 - 147 mEq/L     Potassium  3.4 (*)  3.7 - 5.3 mEq/L     Chloride  101   96 - 112 mEq/L     CO2  29   19 - 32 mEq/L     Glucose, Bld  134 (*)  70 - 99 mg/dL     BUN  4 (*)  6 - 23 mg/dL     Creatinine, Ser  0.55   0.50 - 1.10 mg/dL     Calcium  8.5   8.4 - 10.5 mg/dL     Total Protein  5.4 (*)  6.0 - 8.3 g/dL     Albumin  2.5 (*)  3.5 - 5.2 g/dL     AST  24   0 - 37 U/L     ALT  23   0 - 35 U/L     Alkaline Phosphatase  75   39 - 117 U/L     Total Bilirubin  1.0   0.3 - 1.2 mg/dL     GFR calc non Af Amer  84 (*)  >90 mL/min     GFR calc Af Amer  >90   >90 mL/min     Comment:  (NOTE)        The eGFR has been calculated using the CKD EPI equation.        This calculation has not been validated in all clinical situations.        eGFR's persistently <90 mL/min signify possible Chronic Kidney        Disease.     Anion gap  11   5 - 15   HEPARIN LEVEL (UNFRACTIONATED)     Status: Abnormal     Collection Time      08/18/13  6:07 AM       Result  Value  Ref Range     Heparin Unfractionated  0.29 (*)  0.30 - 0.70 IU/mL     Comment:                IF HEPARIN RESULTS ARE BELOW        EXPECTED VALUES, AND PATIENT        DOSAGE HAS BEEN CONFIRMED,        SUGGEST FOLLOW UP TESTING        OF ANTITHROMBIN III LEVELS.   CBC     Status: Abnormal     Collection Time      08/18/13  6:07 AM  Result  Value  Ref Range     WBC  8.5   4.0 - 10.5 K/uL     RBC  3.98   3.87 - 5.11 MIL/uL     Hemoglobin  11.3 (*)  12.0 - 15.0 g/dL     HCT  35.5 (*)  36.0 - 46.0 %     MCV  89.2   78.0 - 100.0 fL     MCH  28.4   26.0 - 34.0 pg     MCHC  31.8    30.0 - 36.0 g/dL     RDW  14.8   11.5 - 15.5 %     Platelets  152   150 - 400 K/uL   PROTIME-INR     Status: Abnormal     Collection Time      08/18/13  6:07 AM       Result  Value  Ref Range     Prothrombin Time  17.4 (*)  11.6 - 15.2 seconds     INR  1.42   0.00 - 1.49   URINALYSIS, ROUTINE W REFLEX MICROSCOPIC     Status: Abnormal     Collection Time      08/18/13 12:37 PM       Result  Value  Ref Range     Color, Urine  AMBER (*)  YELLOW     Comment:  BIOCHEMICALS MAY BE AFFECTED BY COLOR     APPearance  TURBID (*)  CLEAR     Specific Gravity, Urine  1.013   1.005 - 1.030     pH  6.0   5.0 - 8.0     Glucose, UA  NEGATIVE   NEGATIVE mg/dL     Hgb urine dipstick  MODERATE (*)  NEGATIVE     Bilirubin Urine  NEGATIVE   NEGATIVE     Ketones, ur  NEGATIVE   NEGATIVE mg/dL     Protein, ur  30 (*)  NEGATIVE mg/dL     Urobilinogen, UA  1.0   0.0 - 1.0 mg/dL     Nitrite  NEGATIVE   NEGATIVE     Leukocytes, UA  LARGE (*)  NEGATIVE   URINE MICROSCOPIC-ADD ON     Status: Abnormal     Collection Time      08/18/13 12:37 PM       Result  Value  Ref Range     Squamous Epithelial / LPF  MANY (*)  RARE     WBC, UA  TOO NUMEROUS TO COUNT   <3 WBC/hpf     RBC / HPF  3-6   <3 RBC/hpf     Bacteria, UA  MANY (*)  RARE   CULTURE, BLOOD (ROUTINE X 2)     Status: None     Collection Time      08/18/13  6:10 PM       Result  Value  Ref Range     Specimen Description  BLOOD RIGHT ARM        Special Requests  BOTTLES DRAWN AEROBIC ONLY 10CC        Culture  Setup Time           Value:  08/18/2013 22:25        Performed at [facility]     Culture           Value:         BLOOD CULTURE RECEIVED NO GROWTH TO DATE  CULTURE WILL BE HELD FOR 5 DAYS BEFORE ISSUING A FINAL NEGATIVE REPORT        Performed at [facility]     Report Status  PENDING      CULTURE, BLOOD (ROUTINE X 2)     Status: None     Collection Time      08/18/13  6:16 PM       Result  Value  Ref Range     Specimen  Description  BLOOD LEFT HAND        Special Requests  BOTTLES DRAWN AEROBIC ONLY 5CC        Culture  Setup Time           Value:  08/18/2013 22:25        Performed at [facility]     Culture           Value:         BLOOD CULTURE RECEIVED NO GROWTH TO DATE CULTURE WILL BE HELD FOR 5 DAYS BEFORE ISSUING A FINAL NEGATIVE REPORT        Performed at [facility]     Report Status  PENDING      CBC     Status: Abnormal     Collection Time      08/19/13  6:59 AM       Result  Value  Ref Range     WBC  8.0   4.0 - 10.5 K/uL     RBC  3.38 (*)  3.87 - 5.11 MIL/uL     Hemoglobin  9.6 (*)  12.0 - 15.0 g/dL     HCT  29.5 (*)  36.0 - 46.0 %     MCV  87.3   78.0 - 100.0 fL     MCH  28.4   26.0 - 34.0 pg     MCHC  32.5   30.0 - 36.0 g/dL     RDW  15.1   11.5 - 15.5 %     Platelets  146 (*)  150 - 400 K/uL   PROTIME-INR     Status: Abnormal     Collection Time      08/19/13  6:59 AM       Result  Value  Ref Range     Prothrombin Time  16.9 (*)  11.6 - 15.2 seconds     INR  1.37   0.00 - 1.49    Dg Chest Port 1 View   08/18/2013   CLINICAL DATA:  Shortness of breath following recent left hip arthroplasty. Fever.  EXAM: PORTABLE CHEST - 1 VIEW  COMPARISON:  08/15/2013 and 04/06/2013.  FINDINGS: 0739 hr. Left subclavian AV sequential pacemaker leads are unchanged within the right atrium and right ventricle. There is stable mild cardiomegaly and aortic atherosclerosis. There are lower lung volumes with resulting patchy bibasilar opacities, likely atelectasis. In this clinical setting, early pneumonia cannot be excluded although there is no consolidation. There is no pleural effusion or pneumothorax. The osseous structures appear unchanged.  IMPRESSION: New patchy bibasilar opacities, likely atelectasis.   Electronically Signed   By: Camie Patience M.D.   On: 08/18/2013 08:28           Medical Problem List and Plan: 1. Functional deficits secondary to displaced left femoral neck  fracture.   2.  DVT Prophylaxis/Anticoagulation: Pharmaceutical: Coumadin 3. Pain Management:  Will add voltaren gel for right knee. K pad for lumbago. Continue oxycodone prn  4. Mood:  Team to provide ego support. LCSW to follow for evaluation and support.   5. Neuropsych: This patient is capable of making decisions on her own behalf. 6. Skin/Wound Care:  Routine pressure relief measures. Keep heels elevated. Continue nutritional supplements.   7. Urinary retention:  Monitor voiding with bladder scans--question immobility exacerbating chronic issues.  Cath for volumes > 350 cc. Work on bowel regimen as constipation likely exacerbating symptoms.   8. Constipation: Fleets enema today. Increase miralax to bid.   9. SSS with PPM/ A fib: Monitor HR every 8 hours. Continue metoprolol bid and coumadin.   10  Post op fever: CXR with BLL atelectasis--encourage IS every hour. Continue levaquin D # 2. Positive UA and likely has UTI. Order urine culture if not already done.     11. Hypokalemia: Recheck in am.        Post Admission Physician Evaluation: Functional deficits secondary  to displaced left femoral neck fracture status post hemiarthroplasty. Patient is admitted to receive collaborative, interdisciplinary care between the physiatrist, rehab nursing staff, and therapy team. Patient's level of medical complexity and substantial therapy needs in context of that medical necessity cannot be provided at a lesser intensity of care such as a SNF. Patient has experienced substantial functional loss from his/her baseline which was documented above under the "Functional History" and "Functional Status" headings.  Judging by the patient's diagnosis, physical exam, and functional history, the patient has potential for functional progress which will result in measurable gains while on inpatient rehab.  These gains will be of substantial and practical use upon discharge  in facilitating mobility and self-care at  the household level. Physiatrist will provide 24 hour management of medical needs as well as oversight of the therapy plan/treatment and provide guidance as appropriate regarding the interaction of the two. 24 hour rehab nursing will assist with bladder management, bowel management, safety, skin/wound care, disease management, medication administration, pain management and patient education  and help integrate therapy concepts, techniques,education, etc. PT will assess and treat for/with: pre gait, gait training, endurance , safety, equipment, neuromuscular re education.   Goals are: Mod I. OT will assess and treat for/with: ADLs, Cognitive perceptual skills, Neuromuscular re education, safety, endurance, equipment.   Goals are: Mod I. SLP will assess and treat for/with: NA.  Goals are: NA. Case Management and Social Worker will assess and treat for psychological issues and discharge planning. Team conference will be held weekly to assess progress toward goals and to determine barriers to discharge. Patient will receive at least 3 hours of therapy per day at least 5 days per week. ELOS: 7d        Prognosis:  excellent   Charlett Blake M.D. [facility] FAAPM&R (Sports Med, Neuromuscular Med) Diplomate [place] Board of Electrodiagnostic Med  08/19/2013

## 2013-08-19 NOTE — Progress Notes (Signed)
Fleets enema given per order. Tolerated procedure well. Note small amount of clear fluid return and mucus. Manual check of rectum was negative for stool. Pt expelled flatus and assisted back to bed. Anna Farrell

## 2013-08-20 ENCOUNTER — Inpatient Hospital Stay (HOSPITAL_COMMUNITY): Payer: Medicare Other | Admitting: Occupational Therapy

## 2013-08-20 ENCOUNTER — Inpatient Hospital Stay (HOSPITAL_COMMUNITY): Payer: Medicare Other | Admitting: Physical Therapy

## 2013-08-20 LAB — URINE CULTURE
COLONY COUNT: NO GROWTH
Colony Count: 80000
Culture: NO GROWTH

## 2013-08-20 LAB — CBC WITH DIFFERENTIAL/PLATELET
Basophils Absolute: 0 10*3/uL (ref 0.0–0.1)
Basophils Relative: 0 % (ref 0–1)
EOS ABS: 0.2 10*3/uL (ref 0.0–0.7)
EOS PCT: 4 % (ref 0–5)
HCT: 28.7 % — ABNORMAL LOW (ref 36.0–46.0)
Hemoglobin: 9.3 g/dL — ABNORMAL LOW (ref 12.0–15.0)
Lymphocytes Relative: 16 % (ref 12–46)
Lymphs Abs: 1 10*3/uL (ref 0.7–4.0)
MCH: 28.4 pg (ref 26.0–34.0)
MCHC: 32.4 g/dL (ref 30.0–36.0)
MCV: 87.5 fL (ref 78.0–100.0)
MONOS PCT: 12 % (ref 3–12)
Monocytes Absolute: 0.7 10*3/uL (ref 0.1–1.0)
Neutro Abs: 4.2 10*3/uL (ref 1.7–7.7)
Neutrophils Relative %: 68 % (ref 43–77)
PLATELETS: 163 10*3/uL (ref 150–400)
RBC: 3.28 MIL/uL — ABNORMAL LOW (ref 3.87–5.11)
RDW: 15.3 % (ref 11.5–15.5)
WBC: 6.2 10*3/uL (ref 4.0–10.5)

## 2013-08-20 LAB — COMPREHENSIVE METABOLIC PANEL
ALT: 19 U/L (ref 0–35)
AST: 27 U/L (ref 0–37)
Albumin: 1.9 g/dL — ABNORMAL LOW (ref 3.5–5.2)
Alkaline Phosphatase: 71 U/L (ref 39–117)
Anion gap: 8 (ref 5–15)
BUN: 10 mg/dL (ref 6–23)
CALCIUM: 8.5 mg/dL (ref 8.4–10.5)
CO2: 31 mEq/L (ref 19–32)
Chloride: 103 mEq/L (ref 96–112)
Creatinine, Ser: 0.54 mg/dL (ref 0.50–1.10)
GFR calc non Af Amer: 85 mL/min — ABNORMAL LOW (ref >90)
GLUCOSE: 119 mg/dL — AB (ref 70–99)
Potassium: 2.9 mEq/L — CL (ref 3.7–5.3)
SODIUM: 142 meq/L (ref 137–147)
TOTAL PROTEIN: 4.7 g/dL — AB (ref 6.0–8.3)
Total Bilirubin: 0.5 mg/dL (ref 0.3–1.2)

## 2013-08-20 MED ORDER — LIDOCAINE HCL 2 % EX GEL
1.0000 "application " | Freq: Three times a day (TID) | CUTANEOUS | Status: DC | PRN
  Administered 2013-08-20: 1 via TOPICAL
  Filled 2013-08-20: qty 5

## 2013-08-20 MED ORDER — LIDOCAINE HCL 2 % EX GEL
1.0000 "application " | Freq: Three times a day (TID) | CUTANEOUS | Status: DC | PRN
  Filled 2013-08-20 (×3): qty 5

## 2013-08-20 MED ORDER — LIDOCAINE HCL 2 % EX GEL
1.0000 "application " | Freq: Three times a day (TID) | CUTANEOUS | Status: DC | PRN
  Filled 2013-08-20: qty 5

## 2013-08-20 MED ORDER — LEVOFLOXACIN 750 MG PO TABS
750.0000 mg | ORAL_TABLET | ORAL | Status: DC
  Administered 2013-08-20: 750 mg via ORAL
  Filled 2013-08-20 (×2): qty 1

## 2013-08-20 MED ORDER — POTASSIUM CHLORIDE CRYS ER 20 MEQ PO TBCR
30.0000 meq | EXTENDED_RELEASE_TABLET | Freq: Two times a day (BID) | ORAL | Status: AC
  Administered 2013-08-20 – 2013-08-22 (×6): 30 meq via ORAL
  Filled 2013-08-20 (×6): qty 1

## 2013-08-20 MED ORDER — LEVOFLOXACIN 750 MG PO TABS: 750.0000 mg | ORAL_TABLET | Freq: Every day | ORAL | Status: DC

## 2013-08-20 MED ORDER — WARFARIN SODIUM 3 MG PO TABS
3.0000 mg | ORAL_TABLET | Freq: Once | ORAL | Status: AC
  Administered 2013-08-20: 3 mg via ORAL
  Filled 2013-08-20: qty 1

## 2013-08-20 NOTE — IPOC Note (Addendum)
Overall Plan of Care [facility]) Patient Details Name: Anna Farrell MRN: [id number] DOB: Oct 19, 1927  Admitting Diagnosis: FM FX NO SIP  Hospital Problems: Principal Problem:   Hip fracture requiring operative repair Active Problems:   HYPERTENSION   Chronic atrial fibrillation   BRADYCARDIA-TACHYCARDIA SYNDROME   Orthostatic hypotension   Asymptomatic PVCs   Cardiac pacemaker in situ   Hyperlipidemia   Acute blood loss anemia   Possible acute diastolic heart failure     Functional Problem List: Nursing Bladder;Bowel;Edema;Endurance;Medication Management;Motor;Nutrition;Pain;Safety;Sensory;Skin Integrity  PT Balance;Pain;Edema;Safety;Endurance;Motor;Skin Integrity  OT Balance;Endurance;Motor;Pain;Safety  SLP    TR Endurance;Motor;Pain       Basic ADL's: OT Grooming;Bathing;Dressing;Toileting     Advanced  ADL's: OT Simple Meal Preparation;Laundry     Transfers: PT Bed Mobility;Bed to Chair;Car  OT Toilet;Tub/Shower     Locomotion: PT Ambulation;Wheelchair Mobility;Stairs     Additional Impairments: OT None  SLP        TR      Anticipated Outcomes Item Anticipated Outcome  Self Feeding n/A (N/A)  Swallowing      Basic self-care  supervision  Toileting  supervision   Bathroom Transfers supervision  Bowel/Bladder  Manage bowel and bladder with mod I assist  Transfers  Mod I with RW  Locomotion  50' mod I with RW  Communication     Cognition     Pain  Pain at or below level 5  Safety/Judgment  Maintain safety with supervison   Therapy Plan: PT Intensity: Minimum of 1-2 x/day ,45 to 90 minutes PT Frequency: 5 out of 7 days PT Duration Estimated Length of Stay: 14-17 days OT Intensity: Minimum of 1-2 x/day, 45 to 90 minutes OT Frequency: 5 out of 7 days OT Duration/Estimated Length of Stay: 14-17 days         Team Interventions: Nursing Interventions Patient/Family Education;Skin Care/Wound Management;Bladder Management;Bowel  Management;Disease Management/Prevention;Discharge Planning;Pain Management;Medication Management;Psychosocial Support  PT interventions Ambulation/gait training;Discharge planning;DME/adaptive equipment instruction;Functional mobility training;Pain management;Psychosocial support;Therapeutic Activities;UE/LE Strength taining/ROM;Balance/vestibular training;Disease management/prevention;Neuromuscular re-education;Patient/family education;Stair training;Therapeutic Exercise;UE/LE Coordination activities;Wheelchair propulsion/positioning  OT Interventions Publishing copy;Functional mobility training;Pain management;Psychosocial support;Therapeutic Activities;UE/LE Strength taining/ROM;UE/LE Coordination activities;Therapeutic Exercise;Self Care/advanced ADL retraining;Patient/family education  SLP Interventions    TR Interventions    SW/CM Interventions Discharge Planning;Psychosocial Support;Patient/Family Education    Team Discharge Planning: Destination: PT-Home ,OT- Home , SLP-  Projected Follow-up: PT-Home health PT;Other (comment) (intermittent supervision), OT-  Home health OT;24 hour supervision/assistance, SLP-  Projected Equipment Needs: PT-To be determined;Wheelchair (measurements);Rolling walker with 5" wheels;Wheelchair cushion (measurements), OT- None recommended by OT, SLP-  Equipment Details: PT- , OT-Pt reports owning RW, BSC, manual wheelchair, power chair. tub seat lift Patient/family involved in discharge planning: PT- Patient,  OT-Patient, SLP-   MD ELOS: 7d Medical Rehab Prognosis:  Excellent Assessment: 80 y.o. female with history of SSS, A fib with PPM, orthostatic hypotension, Right knee OA, OSA (new to CPAP); who slipped and fell while in [place] on on 08/15/13 with subsequent displaced left femoral neck fracture. She was transferred to [facility] per family request and was evaluated by Dr. Alain Marion. Chronic  coumadin reversed and patient underwent left hip bipolar hemi on 08/17/13   Now requiring 24/7 Rehab RN,MD, as well as CIR level PT, OT .  Treatment team will focus on ADLs and mobility with goals set at Mod I  See Team Conference Notes for weekly updates to the plan of care

## 2013-08-20 NOTE — Progress Notes (Signed)
ANTICOAGULATION CONSULT NOTE - Follow Up Consult  Pharmacy Consult for Coumadin Indication: atrial fibrillation  Allergies  Allergen Reactions  . Betadine [Povidone Iodine]     burning  . Codeine Nausea And Vomiting    Passed out    Patient Measurements: Height: 5\' 2"  (157.5 cm) Weight: 146 lb 6.2 oz (66.4 kg) IBW/kg (Calculated) : 50.1  Vital Signs: Temp: 98.4 F (36.9 C) (08/14 0538) Temp src: Oral (08/14 0538) BP: 111/76 mmHg (08/14 1108) Pulse Rate: 95 (08/14 1108)  Labs:  Recent Labs  08/18/13 0103  08/18/13 0607 08/19/13 0659 08/20/13 0535  HGB  --   < > 11.3* 9.6* 9.3*  HCT  --   --  35.5* 29.5* 28.7*  PLT  --   --  152 146* 163  LABPROT  --   --  17.4* 16.9*  --   INR  --   --  1.42 1.37  --   HEPARINUNFRC 0.17*  --  0.29*  --   --   CREATININE  --   --  0.55  --  0.54  < > = values in this interval not displayed.  Estimated Creatinine Clearance: 47.6 ml/min (by C-G formula based on Cr of 0.54).   Assessment: 85 YOF on coumadin PTA for afib. Reversed for surgery with total Vit K dose 15mg  (given 8/9-8/10). Coumadin restarted 8/11 post-op. INR 1.37 yesterday, no INR this morning, likely still bellow 2. Pt also on Lovenox DVT prophylaxis dose. Will likely take a while to overcome vit K. Noted pt started on levaquin which will potentiate effects of coumadin.   Home dose 3mg  daily except for 4.5mg  on Thur. INR on admit = 3  Educated on coumadin 8/13  Goal of Therapy:  INR 2-3 Monitor platelets by anticoagulation protocol: Yes   Plan:  - Coumadin 3mg  po x 1 per home dose - f/u daily PT/INR - Continue Lovenox 40mg  daily until INR >/= 2   Maryanna Shape, PharmD, BCPS  Clinical Pharmacist  Pager: [phone]   08/20/2013,12:31 PM

## 2013-08-20 NOTE — Progress Notes (Signed)
Subjective/Complaints: 80 y.o. female with history of SSS, A fib with PPM, orthostatic hypotension, Right knee OA, OSA (new to CPAP); who slipped and fell while in [place] on on 08/15/13 with subsequent displaced left femoral neck fracture. She was transferred to [facility] per family request and was evaluated by Dr. Alain Marion. Chronic coumadin reversed and patient underwent left hip bipolar hemi on 08/17/13. Post op is WBAT and coumadin resumed  Required cath x 1 last noc No BM since admit to CIR  Review of Systems - Negative except Left hip pain and urinary retention  Objective: Vital Signs: Blood pressure 95/62, pulse 95, temperature 98.4 F (36.9 C), temperature source Oral, resp. rate 16, height _0  (1.575 m), weight 66.4 kg (146 lb 6.2 oz), SpO2 95.00%. Dg Chest 2 View  08/19/2013   CLINICAL DATA:  Fever  EXAM: CHEST  2 VIEW  COMPARISON:  Chest radiograph 08/18/2013  FINDINGS: Dual lead pacer apparatus overlies the left hemi thorax, leads are stable in position. Stable enlarged cardiac and mediastinal contours with tortuosity of the thoracic aorta. Heterogeneous opacities demonstrated within the thorax overlying the spine on the lateral view. No pleural effusion or pneumothorax. Mid thoracic spine degenerative change.  IMPRESSION: Heterogeneous opacities overlying the spine on the lateral view may be secondary to atelectasis or infection. Short-term followup radiograph is recommended to ensure resolution.   Electronically Signed   By: Lovey Newcomer M.D.   On: 08/19/2013 20:15   Dg Chest Port 1 View  08/18/2013   CLINICAL DATA:  Shortness of breath following recent left hip arthroplasty. Fever.  EXAM: PORTABLE CHEST - 1 VIEW  COMPARISON:  08/15/2013 and 04/06/2013.  FINDINGS: 0739 hr. Left subclavian AV sequential pacemaker leads are unchanged within the right atrium and right ventricle. There is stable mild cardiomegaly and aortic atherosclerosis. There are lower lung volumes with resulting  patchy bibasilar opacities, likely atelectasis. In this clinical setting, early pneumonia cannot be excluded although there is no consolidation. There is no pleural effusion or pneumothorax. The osseous structures appear unchanged.  IMPRESSION: New patchy bibasilar opacities, likely atelectasis.   Electronically Signed   By: Camie Patience M.D.   On: 08/18/2013 08:28   Results for orders placed during the hospital encounter of 08/19/13 (from the past 72 hour(s))  CBC WITH DIFFERENTIAL     Status: Abnormal   Collection Time    08/20/13  5:35 AM      Result Value Ref Range   WBC 6.2  4.0 - 10.5 K/uL   RBC 3.28 (*) 3.87 - 5.11 MIL/uL   Hemoglobin 9.3 (*) 12.0 - 15.0 g/dL   HCT 28.7 (*) 36.0 - 46.0 %   MCV 87.5  78.0 - 100.0 fL   MCH 28.4  26.0 - 34.0 pg   MCHC 32.4  30.0 - 36.0 g/dL   RDW 15.3  11.5 - 15.5 %   Platelets 163  150 - 400 K/uL   Neutrophils Relative % 68  43 - 77 %   Neutro Abs 4.2  1.7 - 7.7 K/uL   Lymphocytes Relative 16  12 - 46 %   Lymphs Abs 1.0  0.7 - 4.0 K/uL   Monocytes Relative 12  3 - 12 %   Monocytes Absolute 0.7  0.1 - 1.0 K/uL   Eosinophils Relative 4  0 - 5 %   Eosinophils Absolute 0.2  0.0 - 0.7 K/uL   Basophils Relative 0  0 - 1 %   Basophils Absolute 0.0  0.0 - 0.1 K/uL  COMPREHENSIVE METABOLIC PANEL     Status: Abnormal   Collection Time    08/20/13  5:35 AM      Result Value Ref Range   Sodium 142  137 - 147 mEq/L   Potassium 2.9 (*) 3.7 - 5.3 mEq/L   Comment: CRITICAL RESULT CALLED TO, READ BACK BY AND VERIFIED WITH:     SHARP,D RN @ 0654 08/20/13 LEONARD,A   Chloride 103  96 - 112 mEq/L   CO2 31  19 - 32 mEq/L   Glucose, Bld 119 (*) 70 - 99 mg/dL   BUN 10  6 - 23 mg/dL   Creatinine, Ser 0.54  0.50 - 1.10 mg/dL   Calcium 8.5  8.4 - 10.5 mg/dL   Total Protein 4.7 (*) 6.0 - 8.3 g/dL   Albumin 1.9 (*) 3.5 - 5.2 g/dL   AST 27  0 - 37 U/L   ALT 19  0 - 35 U/L   Alkaline Phosphatase 71  39 - 117 U/L   Total Bilirubin 0.5  0.3 - 1.2 mg/dL   GFR calc  non Af Amer 85 (*) >90 mL/min   GFR calc Af Amer >90  >90 mL/min   Comment: (NOTE)     The eGFR has been calculated using the CKD EPI equation.     This calculation has not been validated in all clinical situations.     eGFR's persistently <90 mL/min signify possible Chronic Kidney     Disease.   Anion gap 8  5 - 15     HEENT: normal Cardio: irregular Resp: CTA B/L and unlabored GI: BS positive and NT,ND Extremity:  Pulses positive and No Edema Skin:   Intact and Wound with occlusive dressing Neuro: Alert/Oriented and Abnormal Motor 5/5 in BUE, 4/5 RLE, trace Left HF, KE, 4- ADF Musc/Skel:  Extremity tender Left lateral thigh Gen NAD   Assessment/Plan: 1. Functional deficits secondary to Left femoral neck fx which require 3+ hours per day of interdisciplinary therapy in a comprehensive inpatient rehab setting. Physiatrist is providing close team supervision and 24 hour management of active medical problems listed below. Physiatrist and rehab team continue to assess barriers to discharge/monitor patient progress toward functional and medical goals. FIM:                   Comprehension Comprehension Mode: Auditory Comprehension: 5-Follows basic conversation/direction: With no assist  Expression Expression Mode: Verbal Expression: 5-Expresses basic needs/ideas: With no assist  Social Interaction Social Interaction: 6-Interacts appropriately with others with medication or extra time (anti-anxiety, antidepressant).  Problem Solving Problem Solving: 5-Solves basic 90% of the time/requires cueing < 10% of the time  Memory Memory: 5-Recognizes or recalls 90% of the time/requires cueing < 10% of the time  Medical Problem List and Plan:  1. Functional deficits secondary to displaced left femoral neck fracture.  2. DVT Prophylaxis/Anticoagulation: Pharmaceutical: Coumadin  3. Pain Management: Will add voltaren gel for right knee. K pad for lumbago. Continue oxycodone  prn  4. Mood: Team to provide ego support. LCSW to follow for evaluation and support.  5. Neuropsych: This patient is capable of making decisions on her own behalf.  6. Skin/Wound Care: Routine pressure relief measures. Keep heels elevated. Continue nutritional supplements.  7. Urinary retention: Monitor voiding with bladder scans--question immobility exacerbating chronic issues. Cath for volumes > 350 cc. Work on bowel regimen as constipation likely exacerbating symptoms.  8. Constipation: Fleets enema today. Increase miralax to  bid.  9. SSS with PPM/ A fib: Monitor HR every 8 hours. Continue metoprolol bid and coumadin.  10 Post op fever: CXR with BLL atelectasis--encourage IS every hour. Continue levaquin D # 3. Positive UA and likely has UTI.  11. Hypokalemia: supplement with oral KCL recheck on 8/17.    LOS (Days) 1 A FACE TO FACE EVALUATION WAS PERFORMED  KIRSTEINS,ANDREW E 08/20/2013, 7:09 AM

## 2013-08-20 NOTE — Progress Notes (Signed)
Occupational Therapy Assessment and Plan  Patient Details  Name: Anna Farrell MRN: [id number] Date of Birth: 07/11/1927   OT Diagnosis: muscle weakness (generalized) and pain in joint Rehab Potential: Rehab Potential: Good (for stated goals) ELOS: 14-17 days   Today's Date: 08/20/2013 Time:0855-0955 and 1325-1425 Time Calculation (min): 60 min and 60 min  Problem List:  Patient Active Problem List   Diagnosis Date Noted  . Drug-induced constipation 08/19/2013  . Catheter-associated urinary tract infection 08/19/2013  . Acute respiratory failure with hypoxia 08/19/2013  . Possible acute diastolic heart failure 63/78/5885  . Hip fracture requiring operative repair 08/19/2013  . Hypokalemia 08/18/2013  . Acute blood loss anemia 08/18/2013  . CAP (community acquired pneumonia) 08/18/2013  . Left displaced femoral neck fracture 08/15/2013  . Hyperlipidemia 08/15/2013  . Chest pain 04/18/2013  . Abdominal pain 04/06/2013  . Acute cholangitis 04/06/2013  . SIRS (systemic inflammatory response syndrome) 04/06/2013  . Common bile duct dilatation 04/06/2013  . Transaminitis 04/06/2013  . Cardiac pacemaker in situ 04/06/2013  . Pancreatitis, acute 04/06/2013  . Encounter for therapeutic drug monitoring 02/25/2013  . Asymptomatic PVCs   . Orthostatic hypotension 12/16/2012  . Extrinsic asthma, unspecified 02/27/2009  . DYSLIPIDEMIA 07/09/2007  . HYPERTENSION 07/09/2007  . Chronic atrial fibrillation 07/09/2007  . BRADYCARDIA-TACHYCARDIA SYNDROME 07/09/2007  . VASOVAGAL SYNCOPE 07/09/2007    Past Medical History:  Past Medical History  Diagnosis Date  . Vasovagal syncope   . Hypertension   . Hyperlipidemia   . Orthostatic hypotension   . Osteoporosis   . Pacemaker 10/11/06    implanted by Dr Leonia Reeves for pauses > 4 seconds with afib  . Iron deficiency anemia   . Shortness of breath   . Arthritis   . Colitis 5/13  . Chronic low back pain 2008    crushed vertebrae  .  Tachycardia-bradycardia syndrome   . Permanent atrial fibrillation   . Asymptomatic PVCs   . History of colonic polyps   . Pure hypercholesterolemia   . Extrinsic asthma, unspecified     LOV Dr Joya Gaskins 7/12  . Long term (current) use of anticoagulants   . Cholelithiasis   . Generalized osteoarthrosis, unspecified site    Past Surgical History:  Past Surgical History  Procedure Laterality Date  . Vesicovaginal fistula closure w/ tah  1981  . Pacemaker insertion  10/11/2006    SJM Zephyr XL DR implanted by Dr Leonia Reeves for afib with pause > 4 seconds  . Colonoscopy    . Appendectomy    . Abdominal hysterectomy    . Cholecystectomy  09/17/2011    Procedure: LAPAROSCOPIC CHOLECYSTECTOMY;  Surgeon: Rolm Bookbinder, MD;  Location: [facility];  Service: General;;  . Cardiac catheterization  2009    Normal coronary arteries  . Ercp N/A 04/07/2013    Procedure: ENDOSCOPIC RETROGRADE CHOLANGIOPANCREATOGRAPHY (ERCP);  Surgeon: Jeryl Columbia, MD;  Location: [facility];  Service: Endoscopy;  Laterality: N/A;  . Eus  04/07/2013    Procedure: UPPER ENDOSCOPIC ULTRASOUND (EUS) RADIAL;  Surgeon: Jeryl Columbia, MD;  Location: [facility];  Service: Endoscopy;;  . Hip arthroplasty Left 08/17/2013    Procedure: ARTHROPLASTY BIPOLAR HIP;  Surgeon: Renette Butters, MD;  Location: [facility];  Service: Orthopedics;  Laterality: Left;    Assessment & Plan Clinical Impression: Patient is a 80 y.o. year old female with history of SSS, A fib with PPM, orthostatic hypotension, Right knee OA, OSA (new to CPAP); who slipped and fell while in [place]  on on 08/15/13 with subsequent displaced left femoral neck fracture. She was transferred to [facility] per family request and was evaluated by Dr. Alain Marion. Chronic coumadin reversed and patient underwent left hip bipolar hemi on 08/17/13. Post op is WBAT and coumadin resumed. Patient transferred to CIR on 08/19/2013 .    Patient currently requires mod with basic self-care skills  secondary to muscle weakness, decreased cardiorespiratoy endurance and decreased standing balance, decreased postural control and decreased balance strategies.  Prior to hospitalization, patient could complete ADLs with independent .  Patient will benefit from skilled intervention to increase independence with basic self-care skills prior to discharge home with care partner.  Anticipate patient will require 24 hour supervision and follow up home health.  OT - End of Session Endurance Deficit: Yes Endurance Deficit Description: low activity tolerance, L hip pain  OT Assessment Rehab Potential: Good (for stated goals) Barriers to Discharge: Decreased caregiver support Barriers to Discharge Comments: pt lives at home alone OT Patient demonstrates impairments in the following area(s): Balance;Endurance;Motor;Pain;Safety OT Basic ADL's Functional Problem(s): Grooming;Bathing;Dressing;Toileting OT Advanced ADL's Functional Problem(s): Simple Meal Preparation;Laundry OT Plan OT Intensity: Minimum of 1-2 x/day, 45 to 90 minutes OT Frequency: 5 out of 7 days OT Duration/Estimated Length of Stay: 14-17 days OT Treatment/Interventions: Balance/vestibular training;Discharge planning;DME/adaptive equipment instruction;Functional mobility training;Pain management;Psychosocial support;Therapeutic Activities;UE/LE Strength taining/ROM;UE/LE Coordination activities;Therapeutic Exercise;Self Care/advanced ADL retraining;Patient/family education OT Basic Self-Care Anticipated Outcome(s): supervision OT Toileting Anticipated Outcome(s): supervision OT Bathroom Transfers Anticipated Outcome(s): supervision OT Recommendation Patient destination: Home Follow Up Recommendations: Home health OT;24 hour supervision/assistance Equipment Recommended: None recommended by OT Equipment Details: Pt reports owning RW, BSC, manual wheelchair, power chair. tub seat lift   Skilled Therapeutic Intervention Session 1  [phone]): OT evaluation initiated and completed. Pt educated on OT purpose, POC, and goals. Pt agreeable to POC. Pt performed bathing and dressing seated at EOB with focus on dynamic standing balance , sit to stand, and ADL retraining. Pt required Mod A for sit to stand transfers. OT educating pt on utilization of BUEs for sit to stands. Pt requiring verbal cues to shift weight onto L LE in standing. Pt able to verbalize 3/3 hip precautions.   Session 2 [phone]):Pt with 5/10 pain reported in L hip and stating RN just gave medication prior to therapist arrival. Pt seated in recliner chair upon arrival. OT educated and demonstrated BUE strengthening exercises with use of level 2 resistance theraband. Pt returned demonstration of  3 sets of 10 chest pulls, shoulder flexion, shoulder diagonals, elbow extension, and elbow flexion. Pt requiring frequent rest breaks secondary to fatigue. Pt performed toilet transfer onto [facility] with Mod A and ambulation of ~ 6 feet with use of RW. Pt stating, "I just feel very weak in my legs now." Toilet with A for clothing management.   OT Evaluation Precautions/Restrictions  Precautions Precautions: Posterior Hip;Fall Precaution Comments: pt verbalized 3/3 hip precautions.   Restrictions Weight Bearing Restrictions: Yes LLE Weight Bearing: Weight bearing as tolerated General Chart Reviewed: Yes Pain Pain Assessment Pain Assessment: 0-10 Pain Score: 2  Pain Location: Hip Pain Orientation: Left Pain Descriptors / Indicators: Aching;Sore Pain Onset: On-going Patients Stated Pain Goal: 0 Pain Intervention(s): Medication (See eMAR) Home Living/Prior Functioning Home Living Available Help at Discharge: Family;Available PRN/intermittently Type of Home: House Home Access: Stairs to enter [employer] of Steps: 3 Entrance Stairs-Rails: Can reach both Home Layout: One level Additional Comments: Pt has a chair in shower that lowers into tub and will  raise up into seated position.   Lives With: Alone IADL History Homemaking Responsibilities: Yes Meal Prep Responsibility: Primary Laundry Responsibility: Primary Cleaning Responsibility: Secondary Homemaking Comments: Pt does laundry and simple meal prep Prior Function Level of Independence: Independent with basic ADLs;Independent with transfers;Independent with homemaking with ambulation;Independent with gait  Able to Take Stairs?: Yes Driving: Yes Vocation: Retired Leisure: Hobbies-yes (Comment) Comments: plays with ipad/emails, crossword puzzles, watches some tv Vision/Perception  Vision- History Baseline Vision/History: Glaucoma;Wears glasses Wears Glasses: Distance only Patient Visual Report: No change from baseline Vision- Assessment Vision Assessment?: No apparent visual deficits  Cognition Overall Cognitive Status: Within Functional Limits for tasks assessed Orientation Level: Oriented X4 Attention: Focused;Sustained Focused Attention: Appears intact Sustained Attention: Appears intact Memory: Appears intact Awareness: Appears intact Problem Solving: Appears intact Safety/Judgment: Appears intact Sensation Sensation Light Touch: Appears Intact Stereognosis: Not tested Hot/Cold: Appears Intact Proprioception: Appears Intact Coordination Fine Motor Movements are Fluid and Coordinated: Yes Motor  Motor Motor: Within Functional Limits Motor - Skilled Clinical Observations: kyphotic posture in standing and seated position Mobility  Bed Mobility Bed Mobility: Supine to Sit;Sit to Supine Supine to Sit: 3: Mod assist Supine to Sit Details: Verbal cues for technique;Manual facilitation for placement;Verbal cues for sequencing Sit to Supine: 3: Mod assist Sit to Supine - Details: Manual facilitation for placement;Verbal cues for technique;Verbal cues for sequencing Transfers Sit to Stand: 3: Mod assist Sit to Stand Details: Verbal cues for technique;Manual  facilitation for placement  Trunk/Postural Assessment  Cervical Assessment Cervical Assessment: Exceptions to [facility] Cervical AROM Overall Cervical AROM: Deficits Overall Cervical AROM Comments: forward head Thoracic Assessment Thoracic Assessment: Exceptions to [facility] Thoracic AROM Overall Thoracic AROM Comments: kyphotic in seated and standing positions Lumbar Assessment Lumbar Assessment: Within Functional Limits Postural Control Postural Control: Deficits on evaluation Postural Limitations: forward head and kyphotic  Balance Dynamic Standing Balance Dynamic Standing - Balance Support: Bilateral upper extremity supported;During functional activity Dynamic Standing - Level of Assistance: 4: Min assist Dynamic Standing - Balance Activities:  (LB clothing management, toileting) Extremity/Trunk Assessment RUE Assessment RUE Assessment:  (AROM is WFLS, strength throughout is 3+/5) LUE Assessment LUE Assessment: Within Functional Limits (AROM is WFLS, strength throughout is 3+/5)  FIM:  FIM - Eating Eating Activity: 6: Assistive device: dentures FIM - Grooming Grooming Steps: Wash, rinse, dry face;Brush, comb hair;Wash, rinse, dry hands;Oral care, brush teeth, clean dentures Grooming: 5: Set-up assist to open containers FIM - Bathing Bathing Steps Patient Completed: Chest;Right Arm;Left Arm;Abdomen;Front perineal area;Right upper leg;Left upper leg Bathing: 3: Mod-Patient completes 5-7 7f 10 parts or 50-74% FIM - Upper Body Dressing/Undressing Upper body dressing/undressing steps patient completed: Thread/unthread right sleeve of pullover shirt/dresss;Thread/unthread left sleeve of pullover shirt/dress;Put head through opening of pull over shirt/dress;Pull shirt over trunk Upper body dressing/undressing: 5: Set-up assist to: Obtain clothing/put away FIM - Lower Body Dressing/Undressing Lower body dressing/undressing steps patient completed: Pull pants up/down;Pull underwear  up/down Lower body dressing/undressing: 1: Total-Patient completed less than 25% of tasks FIM - Toileting Toileting steps completed by patient: Adjust clothing prior to toileting;Performs perineal hygiene Toileting: 3: Mod-Patient completed 2 of 3 steps FIM - Radio producer Devices: Nurse, learning disability Transfers: 3-To toilet/BSC: Mod A (lift or lower assist);3-From toilet/BSC: Mod A (lift or lower assist)   Refer to Care Plan for Long Term Goals  Recommendations for other services: Other: Recreational therapy  Discharge Criteria: Patient will be discharged from OT if patient refuses treatment 3 consecutive times without medical reason, if treatment  goals not met, if there is a change in medical status, if patient makes no progress towards goals or if patient is discharged from hospital.  The above assessment, treatment plan, treatment alternatives and goals were discussed and mutually agreed upon: by patient  Phineas Semen 08/20/2013, 5:20 PM

## 2013-08-20 NOTE — Evaluation (Signed)
Physical Therapy Assessment and Plan  Patient Details  Name: Anna Farrell MRN: [id number] Date of Birth: 16-Jan-1927  PT Diagnosis: Abnormal posture, Abnormality of gait, Coordination disorder, Difficulty walking, Dizziness and giddiness, Edema, Low back pain, Muscle weakness and Pain in joint Rehab Potential: Good ELOS: 14-17 days   Today's Date: 08/20/2013 Time: 1100-1200 Time Calculation (min): 60 min  Problem List:  Patient Active Problem List   Diagnosis Date Noted  . Drug-induced constipation 08/19/2013  . Catheter-associated urinary tract infection 08/19/2013  . Acute respiratory failure with hypoxia 08/19/2013  . Possible acute diastolic heart failure 05/39/7673  . Hip fracture requiring operative repair 08/19/2013  . Hypokalemia 08/18/2013  . Acute blood loss anemia 08/18/2013  . CAP (community acquired pneumonia) 08/18/2013  . Left displaced femoral neck fracture 08/15/2013  . Hyperlipidemia 08/15/2013  . Chest pain 04/18/2013  . Abdominal pain 04/06/2013  . Acute cholangitis 04/06/2013  . SIRS (systemic inflammatory response syndrome) 04/06/2013  . Common bile duct dilatation 04/06/2013  . Transaminitis 04/06/2013  . Cardiac pacemaker in situ 04/06/2013  . Pancreatitis, acute 04/06/2013  . Encounter for therapeutic drug monitoring 02/25/2013  . Asymptomatic PVCs   . Orthostatic hypotension 12/16/2012  . Extrinsic asthma, unspecified 02/27/2009  . DYSLIPIDEMIA 07/09/2007  . HYPERTENSION 07/09/2007  . Chronic atrial fibrillation 07/09/2007  . BRADYCARDIA-TACHYCARDIA SYNDROME 07/09/2007  . VASOVAGAL SYNCOPE 07/09/2007    Past Medical History:  Past Medical History  Diagnosis Date  . Vasovagal syncope   . Hypertension   . Hyperlipidemia   . Orthostatic hypotension   . Osteoporosis   . Pacemaker 10/11/06    implanted by Dr Leonia Reeves for pauses > 4 seconds with afib  . Iron deficiency anemia   . Shortness of breath   . Arthritis   . Colitis 5/13  . Chronic  low back pain 2008    crushed vertebrae  . Tachycardia-bradycardia syndrome   . Permanent atrial fibrillation   . Asymptomatic PVCs   . History of colonic polyps   . Pure hypercholesterolemia   . Extrinsic asthma, unspecified     LOV Dr Joya Gaskins 7/12  . Long term (current) use of anticoagulants   . Cholelithiasis   . Generalized osteoarthrosis, unspecified site    Past Surgical History:  Past Surgical History  Procedure Laterality Date  . Vesicovaginal fistula closure w/ tah  1981  . Pacemaker insertion  10/11/2006    SJM Zephyr XL DR implanted by Dr Leonia Reeves for afib with pause > 4 seconds  . Colonoscopy    . Appendectomy    . Abdominal hysterectomy    . Cholecystectomy  09/17/2011    Procedure: LAPAROSCOPIC CHOLECYSTECTOMY;  Surgeon: Rolm Bookbinder, MD;  Location: [facility];  Service: General;;  . Cardiac catheterization  2009    Normal coronary arteries  . Ercp N/A 04/07/2013    Procedure: ENDOSCOPIC RETROGRADE CHOLANGIOPANCREATOGRAPHY (ERCP);  Surgeon: Jeryl Columbia, MD;  Location: [facility];  Service: Endoscopy;  Laterality: N/A;  . Eus  04/07/2013    Procedure: UPPER ENDOSCOPIC ULTRASOUND (EUS) RADIAL;  Surgeon: Jeryl Columbia, MD;  Location: [facility];  Service: Endoscopy;;  . Hip arthroplasty Left 08/17/2013    Procedure: ARTHROPLASTY BIPOLAR HIP;  Surgeon: Renette Butters, MD;  Location: [place];  Service: Orthopedics;  Laterality: Left;    Assessment & Plan Clinical Impression: Anna Farrell is a 80 y.o. female with history of SSS, A fib with PPM, orthostatic hypotension, Right knee OA, OSA (new to CPAP); who slipped and  fell while in [place] on on 08/15/13 with subsequent displaced left femoral neck fracture. She was transferred to [facility] per family request and was evaluated by Dr. Alain Marion. Chronic coumadin reversed and patient underwent left hip bipolar hemi on 08/17/13. Post op is WBAT and coumadin resumed.   Patient transferred to CIR on 08/19/2013 .   Patient  currently requires mod with mobility secondary to muscle weakness, decreased cardiorespiratoy endurance, decreased coordination and decreased standing balance, decreased postural control, decreased balance strategies and difficulty maintaining precautions.  Prior to hospitalization, patient was modified independent  with mobility and lived with Alone in a House home.  Home access is 3Stairs to enter.  Patient will benefit from skilled PT intervention to maximize safe functional mobility, minimize fall risk and decrease caregiver burden for planned discharge home with intermittent assist.  Anticipate patient will benefit from follow up [facility] at discharge.     Skilled Therapeutic Intervention PT Evaluation: PT completes manual muscle testing, sensation testing, balance evaluation, and functional mobility assessment.   Therapeutic Activity: PT instructs pt in bed mobility req mod A for supine to/from sit, mod A sit to stand with RW, min A transfer with RW bed to/from w/c. Pt req verbal cues for sequencing and technique in order to maintain her posterior hip precautions.   Gait Training: PT instructs pt in ambulation with RW x 15' req min A for weight shifting laterally. Pt demonstrates decreased R step length due to decreased L stance time, increased double support stance time, shuffle steps, and significant kyphotic posture.   Pt will benefit from continued L LE ROM and strength training, standing balance reeducation, and w/c management training and improving safety with ambulation.    PT Evaluation Precautions/Restrictions Precautions Precautions: Posterior Hip;Fall Precaution Booklet Issued: Yes (comment) Precaution Comments: pt verbalized 3/3 hip precautions.   Restrictions Weight Bearing Restrictions: Yes LLE Weight Bearing: Weight bearing as tolerated General Chart Reviewed: Yes Family/Caregiver Present: No Vital SignsTherapy Vitals Pulse Rate: 95 BP: 111/76 mmHg Oxygen  Therapy SpO2: 94 % O2 Device: None (Room air) Pain Pain Assessment Pain Assessment: 0-10 Pain Score: 3  Pain Type: Chronic pain Pain Location: Back Pain Orientation: Lower Pain Descriptors / Indicators: Aching Pain Onset: On-going Patients Stated Pain Goal: 0 Pain Intervention(s): Rest Multiple Pain Sites: Yes 2nd Pain Site Pain Score: 3 Pain Type: Surgical pain Pain Location: Hip Pain Orientation: Right Pain Descriptors / Indicators: Aching Pain Onset: On-going Pain Intervention(s): Rest;Repositioned Home Living/Prior Functioning Home Living Available Help at Discharge: Family;Available PRN/intermittently Type of Home: House Home Access: Stairs to enter [employer] of Steps: 3 Entrance Stairs-Rails: Can reach both Home Layout: One level Additional Comments: Pt does not use AD, walks slowly and short distance throughtou her home. Chronic back issues with pain  Lives With: Alone Prior Function Level of Independence: Independent with basic ADLs;Independent with transfers;Independent with homemaking with ambulation;Independent with gait (Pt uses a housecleaner once every 2 weeks)  Able to Take Stairs?: Yes Driving: Yes Vocation: Retired Leisure: Hobbies-yes (Comment) Comments: plays with ipad/emails, crossword puzzles, watches some tv Vision/Perception  Perception Comments: wfl  Cognition Overall Cognitive Status: Within Functional Limits for tasks assessed Orientation Level: Oriented X4 Attention: Focused;Sustained Focused Attention: Appears intact Sustained Attention: Appears intact Memory: Appears intact Awareness: Appears intact Problem Solving: Appears intact Safety/Judgment: Appears intact Sensation Sensation Light Touch: Appears Intact Stereognosis: Not tested Hot/Cold: Not tested Proprioception: Appears Intact (B ankles tested) Coordination Fine Motor Movements are Fluid and Coordinated: Not tested Finger Nose  Finger Test: wfl Motor   Motor Motor: Within Functional Limits Motor - Skilled Clinical Observations: antalgic gait - slowed, shuffle steps and strong kyphotic posture in stand  Mobility Bed Mobility Bed Mobility: Supine to Sit;Sit to Supine Supine to Sit: 3: Mod assist Supine to Sit Details: Verbal cues for technique;Manual facilitation for placement;Verbal cues for sequencing Supine to Sit Details (indicate cue type and reason): hand/trunk placement Sit to Supine: 3: Mod assist Sit to Supine - Details: Manual facilitation for placement;Verbal cues for technique;Verbal cues for sequencing Sit to Supine - Details (indicate cue type and reason): hand/trunk placement Transfers Transfers: Yes Sit to Stand: 3: Mod assist Sit to Stand Details: Verbal cues for technique;Manual facilitation for placement Stand Pivot Transfers: 4: Min guard Locomotion  Ambulation Ambulation: Yes Ambulation/Gait Assistance: 4: Min assist Ambulation Distance (Feet): 15 Feet Assistive device: Rolling walker Gait Gait: Yes Gait Pattern: Impaired Gait Pattern: Decreased step length - right;Decreased stance time - left;Decreased dorsiflexion - right;Decreased dorsiflexion - left;Wide base of support;Antalgic;Lateral hip instability;Shuffle Gait velocity: very slow Stairs / Additional Locomotion Stairs: No Architect: Yes Wheelchair Assistance: 5: Investment banker, operational Details: Verbal cues for Marketing executive: Both upper extremities Wheelchair Parts Management: Needs assistance Distance: 50  Trunk/Postural Assessment  Cervical Assessment Cervical Assessment: Exceptions to [facility] Cervical AROM Overall Cervical AROM: Deficits;Due to premorid status Overall Cervical AROM Comments: strong forward head Thoracic Assessment Thoracic Assessment: Exceptions to [facility] Thoracic AROM Overall Thoracic AROM: Deficits;Due to premorid status Overall Thoracic AROM Comments: kyphotic from  chronic poor posture Lumbar Assessment Lumbar Assessment: Within Functional Limits Postural Control Postural Control: Deficits on evaluation Postural Limitations: significant forward head, moderate kyphosis  Balance Balance Balance Assessed: Yes Static Sitting Balance Static Sitting - Balance Support: Feet supported;Bilateral upper extremity supported Static Sitting - Level of Assistance: 6: Modified independent (Device/Increase time) Dynamic Sitting Balance Dynamic Sitting - Balance Support: During functional activity;Feet unsupported;Left upper extremity supported Dynamic Sitting - Level of Assistance: 5: Stand by assistance Static Standing Balance Static Standing - Balance Support: Bilateral upper extremity supported;During functional activity Static Standing - Level of Assistance: 4: Min assist Dynamic Standing Balance Dynamic Standing - Balance Support: Bilateral upper extremity supported;During functional activity Dynamic Standing - Level of Assistance: 4: Min assist Dynamic Standing - Balance Activities: Lateral lean/weight shifting;Forward lean/weight shifting Extremity Assessment  RUE Assessment RUE Assessment: Within Functional Limits LUE Assessment LUE Assessment: Within Functional Limits RLE Assessment RLE Assessment: Exceptions to [facility] RLE Strength RLE Overall Strength: Deficits;Due to premorbid status RLE Overall Strength Comments: grossly 3+ to 4/5 - pt mainly physcially inactive at home due to chronic LBP LLE Assessment LLE Assessment: Exceptions to [facility] LLE Strength Left Hip Flexion: 3-/5 Left Hip ABduction: 2/5 Left Hip ADduction: 2/5 Left Knee Flexion: 3+/5 Left Knee Extension: 3+/5 Left Ankle Dorsiflexion: 4/5  FIM:  FIM - Control and instrumentation engineer Devices: Walker;Arm rests Bed/Chair Transfer: 3: Supine > Sit: Mod A (lifting assist/Pt. 50-74%/lift 2 legs;3: Sit > Supine: Mod A (lifting assist/Pt. 50-74%/lift 2 legs);3: Bed > Chair  or W/C: Mod A (lift or lower assist);3: Chair or W/C > Bed: Mod A (lift or lower assist) FIM - Locomotion: Wheelchair Distance: 50 Locomotion: Wheelchair: 2: Travels 50 - 149 ft with supervision, cueing or coaxing FIM - Locomotion: Ambulation Locomotion: Ambulation Assistive Devices: Administrator Ambulation/Gait Assistance: 4: Min assist Locomotion: Ambulation: 1: Travels less than 50 ft with minimal assistance (Pt.>75%) FIM - Locomotion: Stairs Locomotion: Stairs: 0: Activity did not  occur   Refer to Care Plan for Long Term Goals  Recommendations for other services: None  Discharge Criteria: Patient will be discharged from PT if patient refuses treatment 3 consecutive times without medical reason, if treatment goals not met, if there is a change in medical status, if patient makes no progress towards goals or if patient is discharged from hospital.  The above assessment, treatment plan, treatment alternatives and goals were discussed and mutually agreed upon: by patient  [facility] M 08/20/2013, 11:51 AM

## 2013-08-20 NOTE — Progress Notes (Signed)
I participated in the care of this patient and agree with the above history, physical and evaluation. I performed a review of the history and a physical exam as detailed   Kelia Gibbon Daniel Sherial Ebrahim MD  

## 2013-08-20 NOTE — Progress Notes (Signed)
Admission Skin assessment completed with Anna Liming, PA. Patient has left hip incision with edema, steri strips intact. Patient has edema to outer labia, external hemorroids and blanchable redness to buttocks. Bruising to extremeties. Roberts-VonCannon, Dawnette Mione Selinda Eon

## 2013-08-20 NOTE — Progress Notes (Signed)
CRITICAL VALUE ALERT  Critical value received:  Potassium  Date of notification:  08/20/2013  Time of notification:  0654  Critical value read back:Yes.    Nurse who received alert:  Rafael Bihari  MD notified (1st page):  Dr. Read Drivers  Time of first page:  [phone]  MD notified (2nd page):  Time of second page:  Responding MD: MD will put the order  Time MD responded:  [phone]

## 2013-08-20 NOTE — Plan of Care (Signed)
Problem: RH BLADDER ELIMINATION Goal: RH STG MANAGE BLADDER WITH ASSISTANCE STG Manage Bladder With minimal Assistance  Outcome: Not Progressing Total assist required; I+O cath and bladder scans

## 2013-08-21 ENCOUNTER — Inpatient Hospital Stay (HOSPITAL_COMMUNITY): Payer: Medicare Other | Admitting: *Deleted

## 2013-08-21 ENCOUNTER — Inpatient Hospital Stay (HOSPITAL_COMMUNITY): Payer: Medicare Other | Admitting: Occupational Therapy

## 2013-08-21 ENCOUNTER — Encounter (HOSPITAL_COMMUNITY): Payer: Medicare Other | Admitting: Occupational Therapy

## 2013-08-21 DIAGNOSIS — Z5189 Encounter for other specified aftercare: Principal | ICD-10-CM

## 2013-08-21 DIAGNOSIS — S72009A Fracture of unspecified part of neck of unspecified femur, initial encounter for closed fracture: Secondary | ICD-10-CM

## 2013-08-21 LAB — PROTIME-INR
INR: 2.33 — AB (ref 0.00–1.49)
PROTHROMBIN TIME: 25.6 s — AB (ref 11.6–15.2)

## 2013-08-21 MED ORDER — WARFARIN SODIUM 3 MG PO TABS
3.0000 mg | ORAL_TABLET | Freq: Once | ORAL | Status: AC
  Administered 2013-08-21: 3 mg via ORAL
  Filled 2013-08-21: qty 1

## 2013-08-21 NOTE — Progress Notes (Signed)
ANTICOAGULATION CONSULT NOTE - Follow Up Consult  Pharmacy Consult for Coumadin Indication: atrial fibrillation  Allergies  Allergen Reactions  . Betadine [Povidone Iodine]     burning  . Codeine Nausea And Vomiting    Passed out    Patient Measurements: Height: 5\' 2"  (157.5 cm) Weight: 146 lb 6.2 oz (66.4 kg) IBW/kg (Calculated) : 50.1  Vital Signs: Temp: 98.8 F (37.1 C) (08/15 0602) Temp src: Oral (08/15 0602) BP: 133/80 mmHg (08/15 0602) Pulse Rate: 108 (08/15 0602)  Labs:  Recent Labs  08/19/13 0659 08/20/13 0535 08/21/13 0651  HGB 9.6* 9.3*  --   HCT 29.5* 28.7*  --   PLT 146* 163  --   LABPROT 16.9*  --  25.6*  INR 1.37  --  2.33*  CREATININE  --  0.54  --     Estimated Creatinine Clearance: 47.6 ml/min (by C-G formula based on Cr of 0.54).   Assessment: 85 YOF on coumadin PTA for afib. Reversed for surgery with total Vit K dose 15mg  (given 8/9-8/10). Coumadin restarted 8/11 post-op. INR 1.37 on 8/13, no INR drawn 8/14. INR this morning therapeutic at 2.33. Pt also on Lovenox DVT prophylaxis dose- requested until INR is therapeutic.  Patient is on Levaquin which will potentiate effects of warfarin and increase INR.  Home dose 3mg  daily except for 4.5mg  on Thur. INR on admit = 3  Educated on coumadin 8/13  Goal of Therapy:  INR 2-3 Monitor platelets by anticoagulation protocol: Yes   Plan:  1. Warfarin 3mg  po x 1 tonight 2. Daily PT/INR. 3. Discontinue Lovenox 40mg  daily as INR is >2 4. Follow for LOT of Levaquin- it is possible when this is stopped she may need a warfarin adjustment   Talia Hoheisel D. Khair Chasteen, PharmD, BCPS Clinical Pharmacist Pager: [phone] 08/21/2013 10:04 AM

## 2013-08-21 NOTE — Progress Notes (Signed)
Patient continues to refuse the CPAP machine. There is no machine in patient's room. Patient is aware to call RT if she does change her mind about wearing the machine. Patient is currently on RA with no distress noted. RT will continue to assist as needed.

## 2013-08-21 NOTE — Progress Notes (Signed)
  Subjective/Complaints: No complaints- minimal leg pain with therapy  Objective: Vital Signs: Blood pressure 133/80, pulse 108, temperature 98.8 F (37.1 C), temperature source Oral, resp. rate 16, height 5\' 2"  (1.575 m), weight 146 lb 6.2 oz (66.4 kg), SpO2 94.00%.  Elderly female. NAD Chest- CTA CV - reg rate  Assessment/Plan: 1. Functional deficits secondary to Left femoral neck fx  Medical Problem List and Plan:  1. Functional deficits secondary to displaced left femoral neck fracture.  2. DVT Prophylaxis/Anticoagulation: Pharmaceutical: Coumadin  3. Pain Management: pain is controlled 4. Mood: Team to provide ego support. LCSW to follow for evaluation and support.  5. Neuropsych: This patient is capable of making decisions on her own behalf.  6. Skin/Wound Care: Routine pressure relief measures. 7. Urinary retention: Monitor voiding with bladder scans--question immobility exacerbating chronic issues. Cath for volumes > 350 cc. Work on bowel regimen as constipation likely exacerbating symptoms.  8. Constipation: Fleets enema today. Resolved 08/21/2013   9. SSS with PPM/ A fib: Monitor HR every 8 hours. Continue metoprolol bid and coumadin.  10 Post op fever: CXR with BLL atelectasis--encourage IS every hour. Continue levaquin D # 3. Positive UA and likely has UTI.  Fever resolved- Urine culture negative D/c ABX 11. Hypokalemia: supplement with oral KCL recheck on 8/17.    LOS (Days) 2 A [place] 08/21/2013, 9:06 AM

## 2013-08-21 NOTE — Progress Notes (Signed)
Occupational Therapy Session Note  Patient Details  Name: Anna Farrell MRN: [id number] Date of Birth: [birth year]  Today's Date: 08/21/2013 Time: 1100-1200 Time Calculation (min): 60 min  Short Term Goals: Week 1:  OT Short Term Goal 1 (Week 1): Pt will perform LB bathing with Mod A in order to increase I with self care.  OT Short Term Goal 2 (Week 1): Pt will perform toileting with Min A in order to increase I in self care.  OT Short Term Goal 3 (Week 1): Pt will perform toilet transfer with min A in order to increase I in functional transfers.  OT Short Term Goal 4 (Week 1): Pt will perform shower transfer with Min A in order to increase I with functional transfers. OT Short Term Goal 5 (Week 1): Pt will perform functional dynamic balance task for 5 minutes with Min A for balance in order to increase I with balance.  Skilled Therapeutic Interventions/Progress Updates:    1:1 self care retraining including functional ambulation to and from the bathroom with RW, shower stall transfer with grab bar, sit to stand and standing balance, bathing at shower level, dressed sitting in w/c (sit to stand), maintaining precautions during functional activity , introduction of AE for LB bathing and dressing (reports family will purchase AE kit for her), issued elastic shoe laces for increased independence with donning shoes, etc. Pt able to complete sit to stand with pushing up from w/c arm rest with bilateral UEs with light steady A.   Therapy Documentation Precautions:  Precautions Precautions: Posterior Hip;Fall Precaution Booklet Issued: Yes (comment) Precaution Comments: pt verbalized 3/3 hip precautions.   Restrictions Weight Bearing Restrictions: Yes LLE Weight Bearing: Weight bearing as tolerated Pain: Pain Assessment Pain Assessment: 0-10 Pain Score: 6  Pain Location: Hip Pain Orientation: Left Pain Descriptors / Indicators: Aching Pain Onset: With Activity Pain Intervention(s):  Medication (See eMAR)  Made RN aware at end of session   See FIM for current functional status  Therapy/Group: Individual Therapy  Willeen [facility] 08/21/2013, 12:48 PM

## 2013-08-21 NOTE — Plan of Care (Signed)
Problem: RH BLADDER ELIMINATION Goal: RH STG MANAGE BLADDER WITH ASSISTANCE STG Manage Bladder With minimal Assistance  Outcome: Not Progressing Requiring intermittent I & O caths

## 2013-08-21 NOTE — Progress Notes (Signed)
Occupational Therapy Session Note  Patient Details  Name: NIMO VERASTEGUI MRN: [id number] Date of Birth: 1927/10/21  Today's Date: 08/21/2013 Time: 1500-1530 Time Calculation (min): 30 min  Short Term Goals: Week 1:  OT Short Term Goal 1 (Week 1): Pt will perform LB bathing with Mod A in order to increase I with self care.  OT Short Term Goal 2 (Week 1): Pt will perform toileting with Min A in order to increase I in self care.  OT Short Term Goal 3 (Week 1): Pt will perform toilet transfer with min A in order to increase I in functional transfers.  OT Short Term Goal 4 (Week 1): Pt will perform shower transfer with Min A in order to increase I with functional transfers. OT Short Term Goal 5 (Week 1): Pt will perform functional dynamic balance task for 5 minutes with Min A for balance in order to increase I with balance.  Skilled Therapeutic Interventions/Progress Updates:  Pt with no c/o pain during session. Upon entering the room, pt supine in bed under all covers. Pt stating, "Why are you here? They said I was done." Pt refusing out of bed activities. Pt agreeable to B UE exercises. OT demonstrated and pt performed 3 sets of 20 chest pulls, elbow flexion, and elbow extension with use of level 2 resistance theraband. Pt required rest breaks during sets secondary to increased fatigue. OT discussed with pt the importance of getting out of bed for therapy and OT goals.   Therapy Documentation Precautions:  Precautions Precautions: Posterior Hip;Fall Precaution Booklet Issued: Yes (comment) Precaution Comments: pt verbalized 3/3 hip precautions.   Restrictions Weight Bearing Restrictions: Yes LLE Weight Bearing: Weight bearing as tolerated  Pain: Pain Assessment Pain Assessment: 0-10 Pain Score: 0-No pain Faces Pain Scale: No hurt Pain Location: Hip Pain Orientation: Left Pain Descriptors / Indicators: Aching Pain Onset: With Activity Pain Intervention(s): Medication (See  eMAR) PAINAD (Pain Assessment in Advanced Dementia) Breathing: normal  See FIM for current functional status  Therapy/Group: Individual Therapy  Phineas Semen 08/21/2013, 3:51 PM

## 2013-08-21 NOTE — Progress Notes (Signed)
Physical Therapy Session Note  Patient Details  Name: Anna Farrell MRN: [id number] Date of Birth: [birth year]  Today's Date: 08/21/2013   Skilled Therapeutic Interventions/Progress Updates:  Session I 4098-1191 68min Patient in bed , agrees to therapy interventions.States her pain is getting worse 5/10-nursing notified and medication has been administered. Patient is able to recall precautions when asked. Transfer supine to sit with mod A and pillow between legs to maintain precautions, assistance in donning LB dressing. Patient transferred to w/c with min A, mod A for sit to stand . Static standing 3 x 1 min-no AD. TE; SLR 2 x 15, Hip abd 2 x15, LAQ and SAQ 2 x 15 each, patient requires increased time to perform exercises, easily distracted. Gait training 2 x 16 feet with RW and with min A, cues for step length and posture.  Patient needed assistance to use BSC -mod A for transfer on and off the commode.  Patient left in the room,sitting in a recliner, with all needs within reach.  Session II 1415-1500 (36min)  Session focused on gait training in the room and in the hall. Patient was able to perform multiple transfers from recliner=>BSC=> W/C with min to mod A and use of RW as AD.  Training on safety during turning and maneuvering in the room with 50% VC. Gait  Training on the hall with RW 2 x30 feet with CGA, minA to stand up.  Patient transferred to supine in bed at the end of the session with mod A -needs help[ to advance LE onto the bed. Patient left in the bed with alarm on and all needs within reach.   Therapy Documentation Precautions:  Precautions Precautions: Posterior Hip;Fall Precaution Booklet Issued: Yes (comment) Precaution Comments: pt verbalized 3/3 hip precautions.   Restrictions Weight Bearing Restrictions: Yes LLE Weight Bearing: Weight bearing as tolerated Vital Signs: Oxygen Therapy O2 Device: None (Room air) Pain: Pain Assessment Pain Assessment:  0-10 Pain Score: 6  Pain Location: Hip Pain Orientation: Left Pain Descriptors / Indicators: Aching Pain Onset: With Activity Pain Intervention(s): Medication (See eMAR)  See FIM for current functional status  Therapy/Group: Individual Therapy  Guadlupe Spanish 08/21/2013, 12:34 PM

## 2013-08-21 NOTE — Progress Notes (Signed)
+/-   sleep. At HS voided 100cc's, PVR-299. No cath at this time. At 0320 voided, PVR-248cc's. At 0551 voided, PVR 241cc's. Last I & O cath > 24 hours ago. 5mg  Oxy ir given at 2334. Ultram 50mg  given at 0220. Robaxin given for complaint of LLE spasms at 0548. Dulcolax supp. Given at 0548. Anna Farrell A

## 2013-08-22 ENCOUNTER — Inpatient Hospital Stay (HOSPITAL_COMMUNITY): Payer: Medicare Other | Admitting: *Deleted

## 2013-08-22 LAB — PROTIME-INR
INR: 2.59 — ABNORMAL HIGH (ref 0.00–1.49)
PROTHROMBIN TIME: 27.8 s — AB (ref 11.6–15.2)

## 2013-08-22 MED ORDER — WARFARIN SODIUM 3 MG PO TABS
3.0000 mg | ORAL_TABLET | ORAL | Status: DC
  Administered 2013-08-22 – 2013-08-30 (×8): 3 mg via ORAL
  Filled 2013-08-22 (×9): qty 1

## 2013-08-22 MED ORDER — WARFARIN SODIUM 4 MG PO TABS
4.5000 mg | ORAL_TABLET | ORAL | Status: DC
  Administered 2013-08-26: 4.5 mg via ORAL
  Filled 2013-08-22: qty 1

## 2013-08-22 NOTE — Progress Notes (Signed)
RT Note: Pt refusing CPAP at this time RT will continue to monitor

## 2013-08-22 NOTE — Progress Notes (Signed)
  Subjective/Complaints: Has some leg pain at night (mild)- no other complaints  Objective: Vital Signs: Blood pressure 126/82, pulse 87, temperature 99.1 F (37.3 C), temperature source Oral, resp. rate 16, height 5\' 2"  (1.575 m), weight 146 lb 6.2 oz (66.4 kg), SpO2 98.00%.  Elderly female. NAD Chest- CTA CV - reg rate Ext- no edema-- no pain of legs to palpation  Assessment/Plan: 1. Functional deficits secondary to Left femoral neck fx  Medical Problem List and Plan:  1. Functional deficits secondary to displaced left femoral neck fracture.  2. DVT Prophylaxis/Anticoagulation: Pharmaceutical: Coumadin  3. Pain Management: pain is controlled 4. Mood: Team to provide ego support. LCSW to follow for evaluation and support.  5. Neuropsych: This patient is capable of making decisions on her own behalf.  6. Skin/Wound Care: Routine pressure relief measures. 7. Urinary retention: improved 8. Constipation: resolved  9. SSS with PPM/ A fib: Monitor HR every 8 hours. Continue metoprolol bid and coumadin.  10 Post op fever: CXR with BLL atelectasis--encourage IS every hour. Continue levaquin D # 3. Positive UA and likely has UTI.  Fever resolved- Urine culture negative D/c ABX 11. Hypokalemia: supplement with oral KCL recheck on 8/17.    LOS (Days) 3 A FACE TO FACE EVALUATION WAS PERFORMED  Annia Gomm HENRY 08/22/2013, 8:58 AM

## 2013-08-22 NOTE — Progress Notes (Signed)
Physical Therapy Session Note  Patient Details  Name: Anna Farrell MRN: [id number] Date of Birth: [birth year]  Today's Date: 08/22/2013 Time: 1024-1109 Time Calculation (min): 45 min  Short Term Goals: Week 1:  PT Short Term Goal 1 (Week 1): Pt will demonstrate bed mobility req min A PT Short Term Goal 2 (Week 1): Pt will demonstrate sit to stand req min A with RW PT Short Term Goal 3 (Week 1): Pt will ambulate 25' with RW req SBA PT Short Term Goal 4 (Week 1): Pt will ascend/descend 2 stairs with B rails req CGA PT Short Term Goal 5 (Week 1): Pt will demonstrate w/c propulsion x 100' req SBA with B UEs.   Skilled Therapeutic Interventions/Progress Updates:    Transfers: Sit to and from stand transfer with rolling walker min assist WBAT LLE; verbal cues for hand placement and controlled descent.  Stand pivot transfer with rolling walker min assist WBAT LLE: verbal cues for sequencing, hand placement and controlled descent.   Ambulation: Patient ambulated 75 feet with rolling walker contact guard assist WBAT LLE. Patient ambulated with a step to gait pattern. Patient required verbal cues for rolling walker management. Patient displayed and antalgic gait with a forward flexed posture and decreased step length bilaterally. Patient required verbal cues for upright posture and step length. Patient required increased time for completion of ambulation trial.  Stairs: Patient negotiated 5 steps with a bilateral handrails and min assist WBAT LLE. Patient performed stairs with a step to pattern. Patient required min assist secondary to increased posterior lean and for steadying assist. Patient educated on proper sequence and technique. Verbal cues required for foot placement.   Seated there ex: B Long arc quads 3x10 B ankle pumps 3x10 B hip flexion 3x10  Standing there ex: B heel raises 3x10  Patient tolerated treatment well. WBAT LLE and THP LLE maintained throughout session.  Patient  required frequent and prolonged rest breaks to tolerate session. Session focused on endurance, strengthening, maintaining precautions, safety, and functional mobility.   Patient handed off directly to nursing aide to assist with toileting. Plan of care to address, safety, precautions, functional mobility, balance, pain management, endurance, and strengthening to ensure a safe discharge home and maximize independence.   Therapy Documentation Precautions:  Precautions Precautions: Posterior Hip;Fall Precaution Booklet Issued: Yes (comment) Precaution Comments: pt verbalized 3/3 hip precautions.   Restrictions Weight Bearing Restrictions: Yes LLE Weight Bearing: Weight bearing as tolerated    Vital Signs: Oxygen Therapy SpO2: 96 % O2 Device: None (Room air) Pain: Pain Assessment Pain Assessment: 0-10 Pain Score: 5  Pain Type: Surgical pain;Acute pain Pain Location: Hip Pain Orientation: Left Pain Descriptors / Indicators: Constant;Aching Pain Intervention(s): Repositioned;Relaxation;Rest  See FIM for current functional status  Therapy/Group: Individual Therapy  Retta Diones 08/22/2013, 12:53 PM

## 2013-08-22 NOTE — Progress Notes (Signed)
ANTICOAGULATION CONSULT NOTE - Follow Up Consult  Pharmacy Consult for Coumadin Indication: atrial fibrillation  Allergies  Allergen Reactions  . Betadine [Povidone Iodine]     burning  . Codeine Nausea And Vomiting    Passed out    Patient Measurements: Height: 5\' 2"  (157.5 cm) Weight: 146 lb 6.2 oz (66.4 kg) IBW/kg (Calculated) : 50.1  Vital Signs: Temp: 99.1 F (37.3 C) (08/16 0651) Temp src: Oral (08/16 0651) BP: 126/82 mmHg (08/16 0651) Pulse Rate: 87 (08/16 0651)  Labs:  Recent Labs  08/20/13 0535 08/21/13 0651 08/22/13 0839  HGB 9.3*  --   --   HCT 28.7*  --   --   PLT 163  --   --   LABPROT  --  25.6* 27.8*  INR  --  2.33* 2.59*  CREATININE 0.54  --   --     Estimated Creatinine Clearance: 47.6 ml/min (by C-G formula based on Cr of 0.54).   Assessment: 85 YOF on coumadin PTA for afib. Reversed for surgery with total Vit K dose 15mg  (given 8/9-8/10). Coumadin restarted 8/11 post-op.  INR this morning therapeutic at 2.59. Levaquin was stopped this morning. Lovenox was stopped yesterday.  Home dose 3mg  daily except for 4.5mg  on Thur. INR on admit = 3  Educated on coumadin 8/13  Goal of Therapy:  INR 2-3 Monitor platelets by anticoagulation protocol: Yes   Plan:  1. Resume home dosing of warfarin 3mg  daily except 4.5mg  on Thursdays 2. MWF INR checks starting tomorrow 3. Follow for s/s bleeding  Anna Farrell D. Anna Farrell, PharmD, BCPS Clinical Pharmacist Pager: [phone] 08/22/2013 10:30 AM

## 2013-08-22 NOTE — Progress Notes (Signed)
Patient awake most of night. Pain not managed-? Increase Oxy IR to 2 tabs. 1 Oxy IR given at [place]. Robaxin and Ultram given at 2327. Complaining of pain to entire LLE, "aching". Ice applied intermittently. Urinary urgency, frequency & stress incontinence. Up to void every hour, PVR's below 300. Anna Farrell A

## 2013-08-23 ENCOUNTER — Inpatient Hospital Stay (HOSPITAL_COMMUNITY): Payer: Medicare Other

## 2013-08-23 ENCOUNTER — Encounter (HOSPITAL_COMMUNITY): Payer: Medicare Other | Admitting: Occupational Therapy

## 2013-08-23 DIAGNOSIS — Z5189 Encounter for other specified aftercare: Secondary | ICD-10-CM

## 2013-08-23 DIAGNOSIS — I4891 Unspecified atrial fibrillation: Secondary | ICD-10-CM

## 2013-08-23 DIAGNOSIS — I951 Orthostatic hypotension: Secondary | ICD-10-CM

## 2013-08-23 DIAGNOSIS — S72009A Fracture of unspecified part of neck of unspecified femur, initial encounter for closed fracture: Secondary | ICD-10-CM

## 2013-08-23 LAB — BASIC METABOLIC PANEL
Anion gap: 10 (ref 5–15)
BUN: 7 mg/dL (ref 6–23)
CALCIUM: 9.6 mg/dL (ref 8.4–10.5)
CO2: 30 mEq/L (ref 19–32)
Chloride: 103 mEq/L (ref 96–112)
Creatinine, Ser: 0.58 mg/dL (ref 0.50–1.10)
GFR, EST NON AFRICAN AMERICAN: 83 mL/min — AB (ref >90)
GLUCOSE: 105 mg/dL — AB (ref 70–99)
Potassium: 4.8 mEq/L (ref 3.7–5.3)
SODIUM: 143 meq/L (ref 137–147)

## 2013-08-23 LAB — PROTIME-INR
INR: 2.39 — ABNORMAL HIGH (ref 0.00–1.49)
PROTHROMBIN TIME: 26.1 s — AB (ref 11.6–15.2)

## 2013-08-23 LAB — CBC
HCT: 35.6 % — ABNORMAL LOW (ref 36.0–46.0)
HEMOGLOBIN: 11.2 g/dL — AB (ref 12.0–15.0)
MCH: 27.7 pg (ref 26.0–34.0)
MCHC: 31.5 g/dL (ref 30.0–36.0)
MCV: 88.1 fL (ref 78.0–100.0)
Platelets: 369 10*3/uL (ref 150–400)
RBC: 4.04 MIL/uL (ref 3.87–5.11)
RDW: 16.2 % — ABNORMAL HIGH (ref 11.5–15.5)
WBC: 7 10*3/uL (ref 4.0–10.5)

## 2013-08-23 MED ORDER — TRAMADOL HCL 50 MG PO TABS
50.0000 mg | ORAL_TABLET | Freq: Four times a day (QID) | ORAL | Status: DC
  Administered 2013-08-23 – 2013-08-31 (×29): 50 mg via ORAL
  Filled 2013-08-23 (×29): qty 1

## 2013-08-23 MED ORDER — ALBUTEROL SULFATE (2.5 MG/3ML) 0.083% IN NEBU: 3.0000 mL | INHALATION_SOLUTION | RESPIRATORY_TRACT | Status: DC | PRN

## 2013-08-23 NOTE — Plan of Care (Signed)
Problem: RH BLADDER ELIMINATION Goal: RH STG MANAGE BLADDER WITH ASSISTANCE STG Manage Bladder With minimal Assistance  Outcome: Not Progressing Voids frequently; some incontinent episodes; maybe retention related?

## 2013-08-23 NOTE — Plan of Care (Signed)
Problem: RH BLADDER ELIMINATION Goal: RH STG MANAGE BLADDER WITH ASSISTANCE STG Manage Bladder With minimal Assistance  Outcome: Not Progressing Incontinent of urine, requiring max assist to change clothes and linen.

## 2013-08-23 NOTE — Progress Notes (Signed)
ANTICOAGULATION CONSULT NOTE - Follow Up Consult  Pharmacy Consult for Coumadin Indication: atrial fibrillation  Allergies  Allergen Reactions  . Betadine [Povidone Iodine]     burning  . Codeine Nausea And Vomiting    Passed out    Patient Measurements: Height: 5\' 2"  (157.5 cm) Weight: 146 lb 6.2 oz (66.4 kg) IBW/kg (Calculated) : 50.1  Vital Signs: Temp: 98.1 F (36.7 C) (08/17 1130) Temp src: Oral (08/17 1130) BP: 89/63 mmHg (08/17 1130) Pulse Rate: 116 (08/17 1130)  Labs:  Recent Labs  08/21/13 0651 08/22/13 0839 08/23/13 0707  LABPROT 25.6* 27.8* 26.1*  INR 2.33* 2.59* 2.39*  CREATININE  --   --  0.58    Estimated Creatinine Clearance: 47.6 ml/min (by C-G formula based on Cr of 0.58).   Assessment: 85 YOF on coumadin PTA for afib. Reversed for surgery with total Vit K dose 15mg  (given 8/9-8/10). Coumadin restarted 8/11 post-op.  INR this morning therapeutic at 2.39. No bleeding noted per chart.  Levaquin was stopped.   Home dose 3mg  daily except for 4.5mg  on Thur. INR on admit = 3  Educated on coumadin 8/13  Goal of Therapy:  INR 2-3 Monitor platelets by anticoagulation protocol: Yes   Plan:  1. Continue home dosing of warfarin 3mg  daily except 4.5mg  on Thursdays 2. MWF INR checks 3. Follow for s/s bleeding  Maryanna Shape, PharmD, BCPS  Clinical Pharmacist  Pager: [phone]  08/23/2013 1:13 PM

## 2013-08-23 NOTE — Progress Notes (Signed)
Physical Therapy Session Note  Patient Details  Name: Anna Farrell MRN: [id number] Date of Birth: [birth year]  Today's Date: 08/23/2013 Time: 0805-0905 Time Calculation (min): 60 min  Short Term Goals: Week 1:  PT Short Term Goal 1 (Week 1): Pt will demonstrate bed mobility req min A PT Short Term Goal 2 (Week 1): Pt will demonstrate sit to stand req min A with RW PT Short Term Goal 3 (Week 1): Pt will ambulate 25' with RW req SBA PT Short Term Goal 4 (Week 1): Pt will ascend/descend 2 stairs with B rails req CGA PT Short Term Goal 5 (Week 1): Pt will demonstrate w/c propulsion x 100' req SBA with B UEs.   Skilled Therapeutic Interventions/Progress Updates:  1:1. Pt received semi-reclined in bed, ready for therapy. Pt reported 4/10 pain, but reported receiving pain medicine prior to start of session. Focus this session on activity tolerance, functional transfers, functional mobility and LE therex.  Pt req mod A for t/f sup>sit EOB w/ HOB flat and use of bed rails, min A for all t/f sit<>stand and SPTs, min A for toileting and min guard A for ambulation in room w/ RW.   Pt propelled w/c 62' w/ B UE and supervision, demonstrating slow but steady pace. Pt reported onset of difficulty breathing as well as lightheadedness, RN made aware and present to assess, BP 95/__, SaO2 100%- recorded by RN.     Pt performed supine therex to target B LE strength and endurance and to accommodate for low BP. Pt w/ good tolerance to exercises including 2x10 reps of: ankle pumps, glute sets, SAQ hip abd (assisted, heel slides (assisted). Mod A for t/f sup<>sit on tx mat w/ use of w/c arm rests for UE support.   Pt left sitting in recliner at end of session w/ all needs in reach.   Therapy Documentation Precautions:  Precautions Precautions: Posterior Hip;Fall Precaution Booklet Issued: Yes (comment) Precaution Comments: pt verbalized 3/3 hip precautions.   Restrictions Weight Bearing Restrictions:  Yes LLE Weight Bearing: Weight bearing as tolerated Pain: Pain Assessment Pain Assessment: 0-10 Pain Score: 4  Pain Type: Surgical pain Pain Location: Leg Pain Orientation: Left Pain Descriptors / Indicators: Aching Pain Frequency: Intermittent Pain Onset: On-going Patients Stated Pain Goal: 3 Pain Intervention(s): Distraction;Rest (Pt reports receiving pain meds prior to start of session)  See FIM for current functional status  Therapy/Group: Individual Therapy  Gilmore Laroche 08/23/2013, 10:13 AM

## 2013-08-23 NOTE — Progress Notes (Signed)
Pt has refused cpap at this time. rT will continue to monitor.

## 2013-08-23 NOTE — Progress Notes (Signed)
Slept better last night. Decreased complaint of urinary urgency and frequency. Incontinent of urine in bed requiring complete linen change, other times dribbles getting up to [facility]. Pain better managed. Complains of pain to left leg and left hip. Anna Farrell

## 2013-08-23 NOTE — Progress Notes (Signed)
Physical Therapy Session Note  Patient Details  Name: Anna Farrell MRN: [id number] Date of Birth: 09/28/1927  Today's Date: 08/23/2013 PT Individual Time: 1415-1500 PT Individual Time Calculation (min): 45 min   Short Term Goals: Week 1:  PT Short Term Goal 1 (Week 1): Pt will demonstrate bed mobility req min A PT Short Term Goal 2 (Week 1): Pt will demonstrate sit to stand req min A with RW PT Short Term Goal 3 (Week 1): Pt will ambulate 25' with RW req SBA PT Short Term Goal 4 (Week 1): Pt will ascend/descend 2 stairs with B rails req CGA PT Short Term Goal 5 (Week 1): Pt will demonstrate w/c propulsion x 100' req SBA with B UEs.   Skilled Therapeutic Interventions/Progress Updates:    Pt received supine in bed w/ RN/NT present completing orthostatic assessment. Pt reported feeling fatigued and ill today due to BP issues. Pt agreeable to participate in bed level therapy. Session focused on bed mobility, LLE strengthening. Pt w/ 2x5 bridges w/ RLE only then RLE/LLE, unable to clear buttocks to reposition hips in bed with bridging. Pt able to recall 3/3 hip precautions independently. Pt's BP during session read at 120/81 in supine, 132/85 seated EOB, 103/67 after 1 minute standing (unable to read BP immediately after standing). Pt reported severely increased dizziness in standing. In bed pt moved supine <> sit w/ ModA for managing BLE, rolled L and R w/ ModA for maintaining precautions. Pt improved mobility w/ use of pillow between knees to maintain precautions. Pt completed x10 ankle circles, quad sets, assisted heel slides, assisted hip abd, isometric hip add in supine. Pt left supine in bed w/ all needs within reach.  Therapy Documentation Precautions:  Precautions Precautions: Posterior Hip;Fall Precaution Booklet Issued: Yes (comment) Precaution Comments: pt verbalized 3/3 hip precautions.   Restrictions Weight Bearing Restrictions: Yes LLE Weight Bearing: Weight bearing as  tolerated Vital Signs: Therapy Vitals Temp: 98.4 F (36.9 C) Temp src: Oral Pulse Rate: 117 Resp: 19 BP: 145/92 mmHg Patient Position (if appropriate): Sitting Oxygen Therapy SpO2: 98 % O2 Device: None (Room air) Pain: Pain Assessment Pain Assessment: 0-10 Pain Score: 5  Pain Type: Surgical pain Pain Location: Hip Pain Orientation: Left Pain Descriptors / Indicators: Aching Pain Frequency: Intermittent Pain Onset: With Activity Patients Stated Pain Goal: 3 Pain Intervention(s): Medication (See eMAR)  See FIM for current functional status  Therapy/Group: Individual Therapy  Rada Hay Rada Hay, PT, DPT 08/23/2013, 3:40 PM

## 2013-08-23 NOTE — Care Management Note (Signed)
Inpatient Rehabilitation Center Individual Statement of Services  Patient Name:  Anna Farrell  Date:  08/23/2013  Welcome to the [facility].  Our goal is to provide you with an individualized program based on your diagnosis and situation, designed to meet your specific needs.  With this comprehensive rehabilitation program, you will be expected to participate in at least 3 hours of rehabilitation therapies Monday-Friday, with modified therapy programming on the weekends.  Your rehabilitation program will include the following services:  Physical Therapy (PT), Occupational Therapy (OT), 24 hour per day rehabilitation nursing, Therapeutic Recreaction (TR), Case Management (Social Worker), Rehabilitation Medicine, Nutrition Services and Pharmacy Services  Weekly team conferences will be held on Tuesdays to discuss your progress.  Your Social Worker will talk with you frequently to get your input and to update you on team discussions.  Team conferences with you and your family in attendance may also be held.  Expected length of stay: 14 days  Overall anticipated outcome: modified independent  Depending on your progress and recovery, your program may change. Your Social Worker will coordinate services and will keep you informed of any changes. Your Social Worker's name and contact numbers are listed  below.  The following services may also be recommended but are not provided by the [facility] will be made to provide these services after discharge if needed.  Arrangements include referral to agencies that provide these services.  Your insurance has been verified to be:  Medicare and [place] Your primary doctor is:  Dr. [name]  Pertinent information will be shared with your doctor and your insurance company.  Social Worker:  [place], [place] or ([phone]   Information discussed with and copy given to patient by: Lennart Pall, 08/23/2013, 9:52 AM

## 2013-08-23 NOTE — Progress Notes (Signed)
Occupational Therapy Session Note  Patient Details  Name: Anna Farrell MRN: [id number] Date of Birth: 03-Mar-1927  Today's Date: 08/23/2013 Time: 1010-1110 Time Calculation (min): 60 min  Short Term Goals: Week 1:  OT Short Term Goal 1 (Week 1): Pt will perform LB bathing with Mod A in order to increase I with self care.  OT Short Term Goal 2 (Week 1): Pt will perform toileting with Min A in order to increase I in self care.  OT Short Term Goal 3 (Week 1): Pt will perform toilet transfer with min A in order to increase I in functional transfers.  OT Short Term Goal 4 (Week 1): Pt will perform shower transfer with Min A in order to increase I with functional transfers. OT Short Term Goal 5 (Week 1): Pt will perform functional dynamic balance task for 5 minutes with Min A for balance in order to increase I with balance.  Skilled Therapeutic Interventions/Progress Updates:    Pt seen for BADL retraining of toileting, bathing, dressing with a focus on activity tolerance, safe functional mobility, and maintaining hip precautions. Pt was able to state her hip precautions, needed 1 reminder during tx when she was about to sit with L foot turned at an angle.  Pt declined shower, just wanted to "freshen up" as she was not feeling well and her blood pressure was running low.  Pt did participate the entire session, but needed frequent and extended rest breaks as she would fatigue easily. Pt was very concerned about becoming dizzy as she was ambulating to bathroom. Needed max encouragement and pt was able to walk to toilet from bed with min A.  Cues to push up with both arms on sit >< stand before reaching for walker.  Pt used reacher to don clothing over L foot with min A and then was able to do R foot. Good standing balance to pull pants over hips. Pt requested to return to bed.  Pt transferred to bed with mod A. Pt had call light and phone in reach.  Therapy Documentation Precautions:   Precautions Precautions: Posterior Hip;Fall Precaution Booklet Issued: Yes (comment) Precaution Comments: pt verbalized 3/3 hip precautions.   Restrictions Weight Bearing Restrictions: Yes LLE Weight Bearing: Weight bearing as tolerated    Vital Signs: Therapy Vitals Temp: 98.1 F (36.7 C) Temp src: Oral Pulse Rate: 116 Resp: 20 BP: 89/63 mmHg Patient Position (if appropriate): Lying Oxygen Therapy SpO2: 98 % O2 Device: None (Room air) Pain: Pain Assessment Pain Assessment: 0-10 Pain Score: 5  Pain Type: Surgical pain Pain Location: Leg Pain Orientation: Left Pain Descriptors / Indicators: Aching Pain Frequency: Intermittent Pain Onset: With Activity Patients Stated Pain Goal: 3 Pain Intervention(s): Distraction;Rest (Pt reports receiving pain meds prior to start of session) ADL:  See FIM for current functional status  Therapy/Group: Individual Therapy  Kerstie Agent 08/23/2013, 11:34 AM

## 2013-08-23 NOTE — Progress Notes (Signed)
Orthopedic Tech Progress Note Patient Details:  Anna Farrell 1927/11/16 [id number]  Ortho Devices Type of Ortho Device: Abdominal binder   [name], [name] 08/23/2013, 12:42 PM

## 2013-08-23 NOTE — Progress Notes (Signed)
Called to room, patient c/o difficulty breathing.  VS stable- BP slightly low 89/63. O2 sats 98% on RA.  Pt states she has days like this where she doesn't feel good and that she had a "rescue inhaler" that she used seldomly when she had these symptoms.  Called Algis Liming, PA to make her aware of patient symptoms.  New orders to follow.  Brita Romp, RN

## 2013-08-23 NOTE — Progress Notes (Signed)
Subjective/Complaints: 80 y.o. female with history of SSS, A fib with PPM, orthostatic hypotension, Right knee OA, OSA (new to CPAP); who slipped and fell while in [place] on on 08/15/13 with subsequent displaced left femoral neck fracture. She was transferred to [facility] per family request and was evaluated by Dr. Alain Marion. Chronic coumadin reversed and patient underwent left hip bipolar hemi on 08/17/13. Post op is WBAT and coumadin resumed  Finally moved bowels this morning---semi hard. Bladder still frequent with retention  Review of Systems - Negative except Left hip pain and urinary retention  Objective: Vital Signs: Blood pressure 138/88, pulse 97, temperature 98.4 F (36.9 C), temperature source Oral, resp. rate 18, height 5\' 2"  (1.575 m), weight 66.4 kg (146 lb 6.2 oz), SpO2 100.00%. No results found. Results for orders placed during the hospital encounter of 08/19/13 (from the past 72 hour(s))  PROTIME-INR     Status: Abnormal   Collection Time    08/21/13  6:51 AM      Result Value Ref Range   Prothrombin Time 25.6 (*) 11.6 - 15.2 seconds   INR 2.33 (*) 0.00 - 1.49  PROTIME-INR     Status: Abnormal   Collection Time    08/22/13  8:39 AM      Result Value Ref Range   Prothrombin Time 27.8 (*) 11.6 - 15.2 seconds   INR 2.59 (*) 0.00 - 1.49     HEENT: normal Cardio: irregular, 90-100 Resp: CTA B/L and unlabored GI: BS positive and NT,ND Extremity:  Pulses positive and trace LLE Edema Skin:   Intact and Wound without sutures or staples Neuro: Alert/Oriented and Abnormal Motor 5/5 in BUE, 4/5 RLE, trace Left HF, KE, 4- ADF Musc/Skel:  Extremity tender Left lateral thigh Gen NAD   Assessment/Plan: 1. Functional deficits secondary to Left femoral neck fx which require 3+ hours per day of interdisciplinary therapy in a comprehensive inpatient rehab setting. Physiatrist is providing close team supervision and 24 hour management of active medical problems listed  below. Physiatrist and rehab team continue to assess barriers to discharge/monitor patient progress toward functional and medical goals. FIM: FIM - Bathing Bathing Steps Patient Completed: Chest;Right Arm;Left Arm;Abdomen;Front perineal area;Right upper leg;Left upper leg Bathing: 3: Mod-Patient completes 5-7 13f 10 parts or 50-74%  FIM - Upper Body Dressing/Undressing Upper body dressing/undressing steps patient completed: Thread/unthread right sleeve of pullover shirt/dresss;Thread/unthread left sleeve of pullover shirt/dress;Put head through opening of pull over shirt/dress;Pull shirt over trunk Upper body dressing/undressing: 5: Set-up assist to: Obtain clothing/put away FIM - Lower Body Dressing/Undressing Lower body dressing/undressing steps patient completed: Pull pants up/down;Pull underwear up/down Lower body dressing/undressing: 1: Total-Patient completed less than 25% of tasks  FIM - Toileting Toileting steps completed by patient: Adjust clothing prior to toileting;Performs perineal hygiene Toileting: 3: Mod-Patient completed 2 of 3 steps  FIM - Radio producer Devices: Nurse, learning disability Transfers: 3-To toilet/BSC: Mod A (lift or lower assist);3-From toilet/BSC: Mod A (lift or lower assist)  FIM - Control and instrumentation engineer Devices: Walker;Arm rests Bed/Chair Transfer: 3: Supine > Sit: Mod A (lifting assist/Pt. 50-74%/lift 2 legs;3: Sit > Supine: Mod A (lifting assist/Pt. 50-74%/lift 2 legs);3: Bed > Chair or W/C: Mod A (lift or lower assist)  FIM - Locomotion: Wheelchair Distance: 50 Locomotion: Wheelchair: 2: Travels 50 - 149 ft with supervision, cueing or coaxing FIM - Locomotion: Ambulation Locomotion: Ambulation Assistive Devices: Administrator Ambulation/Gait Assistance: 4: Min assist Locomotion: Ambulation: 2: Travels 50 -  149 ft with minimal assistance (Pt.>75%)  Comprehension Comprehension Mode:  Auditory Comprehension: 5-Understands complex 90% of the time/Cues < 10% of the time  Expression Expression Mode: Verbal Expression: 5-Expresses complex 90% of the time/cues < 10% of the time  Social Interaction Social Interaction: 6-Interacts appropriately with others with medication or extra time (anti-anxiety, antidepressant).  Problem Solving Problem Solving: 5-Solves complex 90% of the time/cues < 10% of the time  Memory Memory: 5-Recognizes or recalls 90% of the time/requires cueing < 10% of the time  Medical Problem List and Plan:  1. Functional deficits secondary to displaced left femoral neck fracture.  2. DVT Prophylaxis/Anticoagulation: Pharmaceutical: Coumadin  3. Pain Management: Will add voltaren gel for right knee. K pad for lumbago. Continue oxycodone prn  4. Mood: Team to provide ego support. LCSW to follow for evaluation and support.  5. Neuropsych: This patient is capable of making decisions on her own behalf.  6. Skin/Wound Care: Routine pressure relief measures. Keep heels elevated .  7. Urinary retention: still with frequency and retention  -ucx neg, re-check today   -off abx  -continue to check PVR's 8. Constipation:   Increased miralax to bid. Beginning to move bowels better  9. SSS with PPM/ A fib: Monitor HR every 8 hours. Continue metoprolol bid and coumadin.  10 Post op fever: IS. Resolved    11. Hypokalemia: supplement with oral KCL recheck on 8/17.    LOS (Days) 4 A FACE TO FACE EVALUATION WAS PERFORMED  SWARTZ,ZACHARY T 08/23/2013, 7:57 AM

## 2013-08-24 ENCOUNTER — Inpatient Hospital Stay (HOSPITAL_COMMUNITY): Payer: Medicare Other

## 2013-08-24 ENCOUNTER — Encounter (HOSPITAL_COMMUNITY): Payer: Self-pay | Admitting: Cardiology

## 2013-08-24 ENCOUNTER — Inpatient Hospital Stay (HOSPITAL_COMMUNITY): Payer: Medicare Other | Admitting: *Deleted

## 2013-08-24 DIAGNOSIS — I951 Orthostatic hypotension: Secondary | ICD-10-CM

## 2013-08-24 DIAGNOSIS — I4891 Unspecified atrial fibrillation: Secondary | ICD-10-CM

## 2013-08-24 LAB — CULTURE, BLOOD (ROUTINE X 2)
CULTURE: NO GROWTH
Culture: NO GROWTH

## 2013-08-24 MED ORDER — METOPROLOL SUCCINATE ER 50 MG PO TB24
50.0000 mg | ORAL_TABLET | Freq: Two times a day (BID) | ORAL | Status: DC
  Administered 2013-08-24 – 2013-08-31 (×11): 50 mg via ORAL
  Filled 2013-08-24 (×16): qty 1

## 2013-08-24 MED ORDER — AMLODIPINE BESYLATE 5 MG PO TABS
5.0000 mg | ORAL_TABLET | Freq: Every day | ORAL | Status: DC
Start: 2013-08-24 — End: 2013-08-24
  Administered 2013-08-24: 5 mg via ORAL
  Filled 2013-08-24 (×2): qty 1

## 2013-08-24 NOTE — Progress Notes (Signed)
Pt refuses use of our cpap at this time.  Rt will continue to monitor.

## 2013-08-24 NOTE — Progress Notes (Addendum)
Subjective/Complaints: 80 y.o. female with history of SSS, A fib with PPM, orthostatic hypotension, Right knee OA, OSA (new to CPAP); who slipped and fell while in [place] on on 08/15/13 with subsequent displaced left femoral neck fracture. She was transferred to [facility] per family request and was evaluated by Dr. Alain Marion. Chronic coumadin reversed and patient underwent left hip bipolar hemi on 08/17/13. Post op is WBAT and coumadin resumed  Felt very light-headed after standing up with PT. Orthostatic (as noted below). Pt stated---"Sometimes I do this at home" Denies palpitations or chest pain. ?mild sob. Improved after sitting and resting for a few minutes  Review of Systems - Negative except Left hip pain and urinary retention  Objective: Vital Signs: Blood pressure 142/97, pulse 101, temperature 98.4 F (36.9 C), temperature source Oral, resp. rate 18, height 5' 2" (1.575 m), weight 66.4 kg (146 lb 6.2 oz), SpO2 95.00%. Dg Chest 2 View  08/23/2013   CLINICAL DATA:  Post hip surgery.  Shortness of breath.  EXAM: CHEST  2 VIEW  COMPARISON:  08/19/2013  FINDINGS: Tortuosity of the thoracic aorta. This projects posteriorly on the lateral view and may be the cause of posterior basilar density on prior chest x-ray. No confluent airspace opacities or effusions. Heart is upper limits normal in size. Left pacer is unchanged. No acute bony abnormality.  IMPRESSION: No active cardiopulmonary disease.   Electronically Signed   By: Rolm Baptise M.D.   On: 08/23/2013 12:53   Results for orders placed during the hospital encounter of 08/19/13 (from the past 72 hour(s))  PROTIME-INR     Status: Abnormal   Collection Time    08/22/13  8:39 AM      Result Value Ref Range   Prothrombin Time 27.8 (*) 11.6 - 15.2 seconds   INR 2.59 (*) 0.00 - 0.99  BASIC METABOLIC PANEL     Status: Abnormal   Collection Time    08/23/13  7:07 AM      Result Value Ref Range   Sodium 143  137 - 147 mEq/L   Potassium  4.8  3.7 - 5.3 mEq/L   Chloride 103  96 - 112 mEq/L   CO2 30  19 - 32 mEq/L   Glucose, Bld 105 (*) 70 - 99 mg/dL   BUN 7  6 - 23 mg/dL   Creatinine, Ser 0.58  0.50 - 1.10 mg/dL   Calcium 9.6  8.4 - 10.5 mg/dL   GFR calc non Af Amer 83 (*) >90 mL/min   GFR calc Af Amer >90  >90 mL/min   Comment: (NOTE)     The eGFR has been calculated using the CKD EPI equation.     This calculation has not been validated in all clinical situations.     eGFR's persistently <90 mL/min signify possible Chronic Kidney     Disease.   Anion gap 10  5 - 15  PROTIME-INR     Status: Abnormal   Collection Time    08/23/13  7:07 AM      Result Value Ref Range   Prothrombin Time 26.1 (*) 11.6 - 15.2 seconds   INR 2.39 (*) 0.00 - 1.49  CBC     Status: Abnormal   Collection Time    08/23/13  3:20 PM      Result Value Ref Range   WBC 7.0  4.0 - 10.5 K/uL   RBC 4.04  3.87 - 5.11 MIL/uL   Hemoglobin 11.2 (*) 12.0 -  15.0 g/dL   HCT 35.6 (*) 36.0 - 46.0 %   MCV 88.1  78.0 - 100.0 fL   MCH 27.7  26.0 - 34.0 pg   MCHC 31.5  30.0 - 36.0 g/dL   RDW 16.2 (*) 11.5 - 15.5 %   Platelets 369  150 - 400 K/uL     HEENT: normal Cardio: irregular, 90-100 Resp: CTA B/L and unlabored GI: BS positive and NT,ND Extremity:  Pulses positive and trace LLE Edema Skin:   Intact and Wound without sutures or staples Neuro: Alert/Oriented and Abnormal Motor 5/5 in BUE, 4/5 RLE, trace Left HF, KE, 4- ADF Musc/Skel:  Extremity tender Left lateral thigh Gen NAD   Assessment/Plan: 1. Functional deficits secondary to Left femoral neck fx which require 3+ hours per day of interdisciplinary therapy in a comprehensive inpatient rehab setting. Physiatrist is providing close team supervision and 24 hour management of active medical problems listed below. Physiatrist and rehab team continue to assess barriers to discharge/monitor patient progress toward functional and medical goals.  Conservative with therapy this am pending BP/HR  issues.  FIM: FIM - Bathing Bathing Steps Patient Completed: Chest;Right Arm;Left Arm;Abdomen;Front perineal area;Right upper leg;Left upper leg Bathing: 3: Mod-Patient completes 5-7 79f10 parts or 50-74%  FIM - Upper Body Dressing/Undressing Upper body dressing/undressing steps patient completed: Thread/unthread right sleeve of pullover shirt/dresss;Thread/unthread left sleeve of pullover shirt/dress;Put head through opening of pull over shirt/dress;Pull shirt over trunk Upper body dressing/undressing: 5: Set-up assist to: Obtain clothing/put away FIM - Lower Body Dressing/Undressing Lower body dressing/undressing steps patient completed: Thread/unthread right underwear leg;Thread/unthread right pants leg;Pull pants up/down Lower body dressing/undressing: 2: Max-Patient completed 25-49% of tasks  FIM - Toileting Toileting steps completed by patient: Performs perineal hygiene (pt was not wearing pants ) Toileting: 4: Steadying assist  FIM - TRadio producerDevices: BNurse, learning disabilityTransfers: 4-To toilet/BSC: Min A (steadying Pt. > 75%);4-From toilet/BSC: Min A (steadying Pt. > 75%)  FIM - BControl and instrumentation engineerDevices: Walker;Arm rests;Bed rails Bed/Chair Transfer: 3: Supine > Sit: Mod A (lifting assist/Pt. 50-74%/lift 2 legs;3: Sit > Supine: Mod A (lifting assist/Pt. 50-74%/lift 2 legs)  FIM - Locomotion: Wheelchair Distance: 50 Locomotion: Wheelchair: 0: Activity did not occur FIM - Locomotion: Ambulation Locomotion: Ambulation Assistive Devices: WAdministratorAmbulation/Gait Assistance: 4: Min assist Locomotion: Ambulation: 0: Activity did not occur  Comprehension Comprehension Mode: Auditory Comprehension: 5-Understands complex 90% of the time/Cues < 10% of the time  Expression Expression Mode: Verbal Expression: 5-Expresses complex 90% of the time/cues < 10% of the time  Social Interaction Social  Interaction: 5-Interacts appropriately 90% of the time - Needs monitoring or encouragement for participation or interaction.  Problem Solving Problem Solving: 5-Solves basic 90% of the time/requires cueing < 10% of the time  Memory Memory: 4-Recognizes or recalls 75 - 89% of the time/requires cueing 10 - 24% of the time  Medical Problem List and Plan:  1. Functional deficits secondary to displaced left femoral neck fracture.  2. DVT Prophylaxis/Anticoagulation: Pharmaceutical: Coumadin  3. Pain Management: Will add voltaren gel for right knee. K pad for lumbago. Continue oxycodone prn  4. Mood: Team to provide ego support. LCSW to follow for evaluation and support.  5. Neuropsych: This patient is capable of making decisions on her own behalf.  6. Skin/Wound Care: Routine pressure relief measures. Keep heels elevated .  7. Urinary retention: still with frequency and retention  -ucx neg, re-check pending   -off  abx  -continue to check PVR's 8. Constipation:   Increased miralax to bid. Beginning to move bowels better  9. SSS with PPM/ A fib: highly orthotstatic this am. 167/111--->65/48 standing (Checked with PT).  HR has also been variable (50's to 130's)  -EKG ordered  - Continue metoprolol bid and coumadin.   -resumed norvasc at 11m home dose (had been stopped on admit)  -will ask cardiology to assess pt---sees Dr. TRadford Paxas outpt  -continue to push fluids.   -abd binder/THT's 10 Post op fever: IS. Resolved    11. Hypokalemia: supplement with oral KCL recheck on 8/17.    LOS (Days) 5 A FACE TO FACE EVALUATION WAS PERFORMED  SWARTZ,ZACHARY T 08/24/2013, 8:45 AM

## 2013-08-24 NOTE — Consult Note (Signed)
Primary cardiologist: TT  HPI: 80 year old female with past medical history of permanent atrial fibrillation, prior pacemaker placement, orthostatic hypotension for evaluation of tachycardia and orthostasis. Echocardiogram in May of 2015 showed normal LV function and mildly dilated descending aorta. Mild left atrial enlargement. Nuclear study in April of 2015 showed a fixed septal defect felt to be infarct versus artifact. No ischemia. Patient was [place] and fell on 08/15/2013. She had a left femoral neck fracture. She was transferred to [facility] and underwent surgical repair on 08/17/2013. She is slowly recovering. She continues to have significant left lower extremity pain. She has occasional dyspnea unchanged compared to home. She has not had chest pain or palpitations. Patient had orthostatic symptoms today and her sitting blood pressure was documented at 142/97 with a pulse of 101. After standing She was noted to be 65/48. She has been intermittently tachycardic. Cardiology asked to evaluate.  Medications Prior to Admission  Medication Sig Dispense Refill  . beclomethasone (QVAR) 40 MCG/ACT inhaler Inhale 2 puffs into the lungs 2 (two) times daily as needed (breathing, shortness of breath).      . Cholecalciferol (VITAMIN D3) 1000 UNITS CAPS Take 2 capsules by mouth daily.       Marland Kitchen docusate sodium (COLACE) 100 MG capsule Take 1 capsule (100 mg total) by mouth 2 (two) times daily. Continue this while taking narcotics to help with bowel movements  30 capsule  1  . HYDROcodone-acetaminophen (NORCO) 5-325 MG per tablet Take 1-2 tablets by mouth every 4 (four) hours as needed for moderate pain.  90 tablet  0  . levofloxacin (LEVAQUIN) 750 MG tablet Take 1 tablet (750 mg total) by mouth every other day.  3 tablet  0  . lidocaine (LIDODERM) 5 % Place 1 patch onto the skin daily.       . metoprolol succinate (TOPROL-XL) 25 MG 24 hr tablet Take 25 mg by mouth 2 (two) times daily.        . nitroGLYCERIN (NITROSTAT) 0.4 MG SL tablet Place 1 tablet (0.4 mg total) under the tongue every 5 (five) minutes as needed for chest pain.  90 tablet  3  . polyethylene glycol (MIRALAX / GLYCOLAX) packet Take 17 g by mouth daily.  14 each  0  . pravastatin (PRAVACHOL) 40 MG tablet Take 1 tablet (40 mg total) by mouth daily. Once daily  90 tablet  1  . TRAVATAN Z 0.004 % SOLN ophthalmic solution Place 1 drop into both eyes daily.      . vitamin B-12 (CYANOCOBALAMIN) 100 MCG tablet Take 100 mcg by mouth once a week.      . warfarin (COUMADIN) 3 MG tablet 1 tablet daily except 1.5 tablets on Thursdays or as directed by coumadin clinic  105 tablet  1    Allergies  Allergen Reactions  . Betadine [Povidone Iodine]     burning  . Codeine Nausea And Vomiting    Passed out    Past Medical History  Diagnosis Date  . Vasovagal syncope   . Hypertension   . Hyperlipidemia   . Orthostatic hypotension   . Osteoporosis   . Pacemaker 10/11/06    implanted by Dr Amil Amen for pauses > 4 seconds with afib  . Iron deficiency anemia   . Colitis 5/13  . Chronic low back pain 2008    crushed vertebrae  . Tachycardia-bradycardia syndrome   . Permanent atrial fibrillation   . Asymptomatic PVCs   . History of colonic  polyps   . Extrinsic asthma, unspecified     LOV Dr Joya Gaskins 7/12  . Long term (current) use of anticoagulants   . Cholelithiasis   . Generalized osteoarthrosis, unspecified site   . Pancreatitis     Past Surgical History  Procedure Laterality Date  . Vesicovaginal fistula closure w/ tah  1981  . Pacemaker insertion  10/11/2006    SJM Zephyr XL DR implanted by Dr Leonia Reeves for afib with pause > 4 seconds  . Colonoscopy    . Appendectomy    . Abdominal hysterectomy    . Cholecystectomy  09/17/2011    Procedure: LAPAROSCOPIC CHOLECYSTECTOMY;  Surgeon: Rolm Bookbinder, MD;  Location: [facility];  Service: General;;  . Cardiac catheterization  2009    Normal coronary arteries  . Ercp  N/A 04/07/2013    Procedure: ENDOSCOPIC RETROGRADE CHOLANGIOPANCREATOGRAPHY (ERCP);  Surgeon: Jeryl Columbia, MD;  Location: [facility];  Service: Endoscopy;  Laterality: N/A;  . Eus  04/07/2013    Procedure: UPPER ENDOSCOPIC ULTRASOUND (EUS) RADIAL;  Surgeon: Jeryl Columbia, MD;  Location: [facility];  Service: Endoscopy;;  . Hip arthroplasty Left 08/17/2013    Procedure: ARTHROPLASTY BIPOLAR HIP;  Surgeon: Renette Butters, MD;  Location: [facility];  Service: Orthopedics;  Laterality: Left;    History   Social History  . Marital Status: Widowed    Spouse Name: N/A    Number of Children: N/A  . Years of Education: N/A   Occupational History  . Waitress     [employer]   Social History Main Topics  . Smoking status: Never Smoker   . Smokeless tobacco: Never Used  . Alcohol Use: No  . Drug Use: No  . Sexual Activity: No   Other Topics Concern  . Not on file   Social History Narrative  . No narrative on file    Family History  Problem Relation Age of Onset  . Hypertension Mother     ROS:  Left lower extremity pain and intermittent dyspnea but no fevers or chills, productive cough, hemoptysis, dysphasia, odynophagia, melena, hematochezia, dysuria, hematuria, rash, seizure activity, orthopnea, PND, pedal edema, claudication. Remaining systems are negative.  Physical Exam:   Blood pressure 142/97, pulse 101, temperature 98.4 F (36.9 C), temperature source Oral, resp. rate 18, height $RemoveBe'5\' 2"'GwrryxGyT$  (1.575 m), weight 146 lb 6.2 oz (66.4 kg), SpO2 95.00%.  General:  Well developed/well nourished in NAD Skin warm/dry Patient not depressed No peripheral clubbing Back-normal HEENT-normal/normal eyelids Neck supple/normal carotid upstroke bilaterally; no bruits; no JVD; no thyromegaly chest - CTA/ normal expansion CV - Irregular/normal S1 and S2; no murmurs, rubs or gallops;  PMI nondisplaced Abdomen -NT/ND, no HSM, no mass, + bowel sounds, no bruit 2+ femoral pulses, no bruits Ext-no edema,  s/p repair left hip fracture, 2+ DP Neuro-grossly nonfocal  ECG Atrial fibrillation with nonspecific ST changes.  Results for orders placed during the hospital encounter of 08/19/13 (from the past 48 hour(s))  BASIC METABOLIC PANEL     Status: Abnormal   Collection Time    08/23/13  7:07 AM      Result Value Ref Range   Sodium 143  137 - 147 mEq/L   Potassium 4.8  3.7 - 5.3 mEq/L   Chloride 103  96 - 112 mEq/L   CO2 30  19 - 32 mEq/L   Glucose, Bld 105 (*) 70 - 99 mg/dL   BUN 7  6 - 23 mg/dL   Creatinine, Ser 0.58  0.50 -  1.10 mg/dL   Calcium 9.6  8.4 - 10.5 mg/dL   GFR calc non Af Amer 83 (*) >90 mL/min   GFR calc Af Amer >90  >90 mL/min   Comment: (NOTE)     The eGFR has been calculated using the CKD EPI equation.     This calculation has not been validated in all clinical situations.     eGFR's persistently <90 mL/min signify possible Chronic Kidney     Disease.   Anion gap 10  5 - 15  PROTIME-INR     Status: Abnormal   Collection Time    08/23/13  7:07 AM      Result Value Ref Range   Prothrombin Time 26.1 (*) 11.6 - 15.2 seconds   INR 2.39 (*) 0.00 - 1.49  CBC     Status: Abnormal   Collection Time    08/23/13  3:20 PM      Result Value Ref Range   WBC 7.0  4.0 - 10.5 K/uL   RBC 4.04  3.87 - 5.11 MIL/uL   Hemoglobin 11.2 (*) 12.0 - 15.0 g/dL   HCT 35.6 (*) 36.0 - 46.0 %   MCV 88.1  78.0 - 100.0 fL   MCH 27.7  26.0 - 34.0 pg   MCHC 31.5  30.0 - 36.0 g/dL   RDW 16.2 (*) 11.5 - 15.5 %   Platelets 369  150 - 400 K/uL    Dg Chest 2 View  08/23/2013   CLINICAL DATA:  Post hip surgery.  Shortness of breath.  EXAM: CHEST  2 VIEW  COMPARISON:  08/19/2013  FINDINGS: Tortuosity of the thoracic aorta. This projects posteriorly on the lateral view and may be the cause of posterior basilar density on prior chest x-ray. No confluent airspace opacities or effusions. Heart is upper limits normal in size. Left pacer is unchanged. No acute bony abnormality.  IMPRESSION: No active  cardiopulmonary disease.   Electronically Signed   By: Rolm Baptise M.D.   On: 08/23/2013 12:53    Assessment/Plan 1 atrial fibrillation-patient has permanent atrial fibrillation. Her rate is intermittently elevated. This is most likely because of pain in left lower extremity superimposed on underlying atrial fibrillation. She also may have a urinary tract infection with followup culture pending. Increase Toprol to 50 mg by mouth twice a day. Continue Coumadin. Follow heart rate and adjust as needed. 2 status post pacemaker 3 orthostatic hypotension-she has had intermittent problems with this chronically. Continue compression hose and encourage by mouth fluid intake. Discontinue Norvasc to allow blood pressure to run higher to decrease symptoms with standing. 4 status post repair of left hip fracture-management per orthopedics. 5 hypertension-follow blood pressure with above changes. We will allow her pressure to run higher to prevent orthostatic symptoms.  Kirk Ruths MD 08/24/2013, 9:43 AM

## 2013-08-24 NOTE — Progress Notes (Signed)
Physical Therapy Session Note  Patient Details  Name: Anna Farrell MRN: [id number] Date of Birth: [birth year]  Today's Date: 08/24/2013 PT Individual Time: 0800-0850 PT Individual Time Calculation (min): 50 min   Short Term Goals: Week 1:  PT Short Term Goal 1 (Week 1): Pt will demonstrate bed mobility req min A PT Short Term Goal 2 (Week 1): Pt will demonstrate sit to stand req min A with RW PT Short Term Goal 3 (Week 1): Pt will ambulate 25' with RW req SBA PT Short Term Goal 4 (Week 1): Pt will ascend/descend 2 stairs with B rails req CGA PT Short Term Goal 5 (Week 1): Pt will demonstrate w/c propulsion x 100' req SBA with B UEs.   Skilled Therapeutic Interventions/Progress Updates:    Received seated in chair, agreeable to participate in therapy. BP in seated 126/11, standing BP of 68/51 w/ reports of extreme dizziness/lightheadedness. ? Of inaccurate BP readings 2/2 pt's A-fib. MD present and made aware of pt's BP. MD discussed treatment options and ordered EKG for this AM and suggested donning compression stockings. Therapist w/ Griffin Basil for pt to don medium compression stockings for BP management. RN came to room to perform EKG. Pt missed 10 minutes of scheduled PT time for EKG assessment. Remainder of session focused on seated BLE strengthening and functional mobility. Pt performed x20 seated ankle DF/PF, x15 LAQ in seated, x15 HS curls w/ washcloth under foot. Pt performed SPT w/ RW w/ ModA for balance and safety chair>recliner. Pt noted decreased dizziness w/ stand for transfer vs stand earlier in session. Pt left seated in recliner w/ all needs within reach.  Therapy Documentation Precautions:  Precautions Precautions: Posterior Hip;Fall Precaution Booklet Issued: Yes (comment) Precaution Comments: pt verbalized 3/3 hip precautions.   Restrictions Weight Bearing Restrictions: Yes LLE Weight Bearing: Weight bearing as tolerated General: PT Amount of Missed Time (min): 10  Minutes PT Missed Treatment Reason: Nursing care (EKG ordered by MD) Vital Signs: Therapy Vitals Temp: 98.4 F (36.9 C) Temp src: Oral Resp: 18 Patient Position (if appropriate): Orthostatic Vitals Oxygen Therapy SpO2: 95 % O2 Device: None (Room air) Pain: Pain Assessment Pain Assessment: 0-10 Pain Score: 5  Pain Type: Surgical pain Pain Location: Leg Pain Orientation: Left Pain Descriptors / Indicators: Aching Pain Frequency: Intermittent Pain Onset: Gradual Patients Stated Pain Goal: 2 Pain Intervention(s): Medication (See eMAR) 2nd Pain Site Pain Intervention(s): Medication (See eMAR)  See FIM for current functional status  Therapy/Group: Individual Therapy  Rada Hay Rada Hay, PT, DPT 08/24/2013, 8:59 AM

## 2013-08-24 NOTE — Progress Notes (Signed)
Physical Therapy Session Note  Patient Details  Name: Anna Farrell MRN: [id number] Date of Birth: 10-16-1927  Today's Date: 08/24/2013 PT Individual Time: 9357-0177 PT Individual Time Calculation (min): 45 min   Short Term Goals: Week 1:  PT Short Term Goal 1 (Week 1): Pt will demonstrate bed mobility req min A PT Short Term Goal 2 (Week 1): Pt will demonstrate sit to stand req min A with RW PT Short Term Goal 3 (Week 1): Pt will ambulate 25' with RW req SBA PT Short Term Goal 4 (Week 1): Pt will ascend/descend 2 stairs with B rails req CGA PT Short Term Goal 5 (Week 1): Pt will demonstrate w/c propulsion x 100' req SBA with B UEs.   Skilled Therapeutic Interventions/Progress Updates:  1:1. Pt received semi-reclined in bed, ready for therapy. Pt reporting 4/10 pain, RN present to address. Focus this session on functional transfers, functional endurance and gait training.  Pt utilized theraband to self-assist L LE to EOB, however, pt continued to req mod A for trunk during t/f sup>sit EOB w/ use of bed rails and HOB slightly elevated. Pt consistently req min A for t/f sit<>stand w/ RW. Pt propelled w/c 100' w/ B UE, demonstrating slow but steady pace.  Pt w/ overall good tolerance to ambulation 75'x2 w/ RW and min guard A. Pt cued for decreased step length on L and increased step length on R to achieve reciprocal pattern. Overall, pt demonstrates very slow ambulatory pace. Pt able to safely negotiate up/down 5 steps w/ B rail and min A, pt seq steps correctly w/out cues. No report of lightheadedness during standing mobility.  Pt req seated rest throughout session due to fatigue, BP recorded at end of session as 117/65 in sitting. Pt left sitting in w/c, nurse tech in room.   Therapy Documentation Precautions:  Precautions Precautions: Posterior Hip;Fall Precaution Booklet Issued: Yes (comment) Precaution Comments: pt verbalized 3/3 hip precautions.   Restrictions Weight Bearing  Restrictions: Yes LLE Weight Bearing: Weight bearing as tolerated Pain: Pain Assessment Pain Assessment: 0-10 Pain Score: 4  Pain Type: Surgical pain Pain Location: Leg Pain Orientation: Left Pain Descriptors / Indicators: Aching Pain Frequency: Intermittent Pain Onset: Gradual Patients Stated Pain Goal: 2 Pain Intervention(s): Medication (See eMAR)  See FIM for current functional status  Therapy/Group: Individual Therapy  Gilmore Laroche 08/24/2013, 3:00 PM

## 2013-08-24 NOTE — Plan of Care (Signed)
Problem: RH BLADDER ELIMINATION Goal: RH STG MANAGE BLADDER WITH ASSISTANCE STG Manage Bladder With minimal Assistance  Outcome: Not Progressing Has incont, episodes,requires staff to clean pt and change pad,brief

## 2013-08-24 NOTE — Progress Notes (Signed)
Social Work  Social Work Assessment and Plan  Patient Details  Name: Anna Farrell MRN: [id number] Date of Birth: 05/09/1927  Today's Date: 08/20/2013  Problem List:  Patient Active Problem List   Diagnosis Date Noted  . Drug-induced constipation 08/19/2013  . Catheter-associated urinary tract infection 08/19/2013  . Acute respiratory failure with hypoxia 08/19/2013  . Possible acute diastolic heart failure 01/75/1025  . Hip fracture requiring operative repair 08/19/2013  . Hypokalemia 08/18/2013  . Acute blood loss anemia 08/18/2013  . CAP (community acquired pneumonia) 08/18/2013  . Left displaced femoral neck fracture 08/15/2013  . Hyperlipidemia 08/15/2013  . Chest pain 04/18/2013  . Abdominal pain 04/06/2013  . Acute cholangitis 04/06/2013  . SIRS (systemic inflammatory response syndrome) 04/06/2013  . Common bile duct dilatation 04/06/2013  . Transaminitis 04/06/2013  . Cardiac pacemaker in situ 04/06/2013  . Pancreatitis, acute 04/06/2013  . Encounter for therapeutic drug monitoring 02/25/2013  . Asymptomatic PVCs   . Orthostatic hypotension 12/16/2012  . Extrinsic asthma, unspecified 02/27/2009  . DYSLIPIDEMIA 07/09/2007  . HYPERTENSION 07/09/2007  . Chronic atrial fibrillation 07/09/2007  . BRADYCARDIA-TACHYCARDIA SYNDROME 07/09/2007  . VASOVAGAL SYNCOPE 07/09/2007   Past Medical History:  Past Medical History  Diagnosis Date  . Vasovagal syncope   . Hypertension   . Hyperlipidemia   . Orthostatic hypotension   . Osteoporosis   . Pacemaker 10/11/06    implanted by Dr Leonia Reeves for pauses > 4 seconds with afib  . Iron deficiency anemia   . Colitis 5/13  . Chronic low back pain 2008    crushed vertebrae  . Tachycardia-bradycardia syndrome   . Permanent atrial fibrillation   . Asymptomatic PVCs   . History of colonic polyps   . Extrinsic asthma, unspecified     LOV Dr Joya Gaskins 7/12  . Long term (current) use of anticoagulants   . Cholelithiasis   .  Generalized osteoarthrosis, unspecified site   . Pancreatitis    Past Surgical History:  Past Surgical History  Procedure Laterality Date  . Vesicovaginal fistula closure w/ tah  1981  . Pacemaker insertion  10/11/2006    SJM Zephyr XL DR implanted by Dr Leonia Reeves for afib with pause > 4 seconds  . Colonoscopy    . Appendectomy    . Abdominal hysterectomy    . Cholecystectomy  09/17/2011    Procedure: LAPAROSCOPIC CHOLECYSTECTOMY;  Surgeon: Rolm Bookbinder, MD;  Location: [facility];  Service: General;;  . Cardiac catheterization  2009    Normal coronary arteries  . Ercp N/A 04/07/2013    Procedure: ENDOSCOPIC RETROGRADE CHOLANGIOPANCREATOGRAPHY (ERCP);  Surgeon: Jeryl Columbia, MD;  Location: [facility];  Service: Endoscopy;  Laterality: N/A;  . Eus  04/07/2013    Procedure: UPPER ENDOSCOPIC ULTRASOUND (EUS) RADIAL;  Surgeon: Jeryl Columbia, MD;  Location: [facility];  Service: Endoscopy;;  . Hip arthroplasty Left 08/17/2013    Procedure: ARTHROPLASTY BIPOLAR HIP;  Surgeon: Renette Butters, MD;  Location: [facility];  Service: Orthopedics;  Laterality: Left;   Social History:  reports that she has never smoked. She has never used smokeless tobacco. She reports that she does not drink alcohol or use illicit drugs.  Family / Support Systems Marital Status: Widow/Widower How Long?: 2007 Patient Roles: Parent Children: son, October Peery [phone];  daughter, Scherrie Bateman @ [phone]) [phone] and son-in-law, Adella Hare @ [phone] Anticipated Caregiver: family intermittently Ability/Limitations of Caregiver: Pandora Leiter, Gerald Stabs, is retired and all children live locally.  They plan to assist  with meals, coming by daily to assist until pt Mod I Caregiver Availability: Intermittent Family Dynamics: Pt describes very good support from family and notes "they would help with anything I need"  Social History Preferred language: English Religion: Baptist Cultural Background: NA Education: HS Read: Yes Write:  Yes Employment Status: Retired Date Retired/Disabled/Unemployed: she is uncertain what year she retired.  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 Legal Hisotry/Current Legal Issues: none Guardian/Conservator: none - per MD, pt capable of making decisions on her own behalf   Abuse/Neglect Physical Abuse: Denies Verbal Abuse: Denies Sexual Abuse: Denies Exploitation of patient/patient's resources: Denies Self-Neglect: Denies  Emotional Status Pt's affect, behavior adn adjustment status: Pt very pleasant, oriented and completes interview easily.  She does report "not feeling great" due to noted BP issues.  PT admits frustration with fall and hip fx especially since it occured the 2nd day of their beach vacation.  She denies any significant emotional distress.  No s/s of depression or anxiety but will monitor through stay. Recent Psychosocial Issues: None Pyschiatric History: None - pt reports "I don't do depression...not that..." Substance Abuse History: None  Patient / Family Perceptions, Expectations & Goals Pt/Family understanding of illness & functional limitations: Pt and family with good understanding of her injury and precausions to be followed.  Good understanding of purpose of CIR. Premorbid pt/family roles/activities: Pt was completely independent PTA and able to describe how she moves about her home with full awareness of when her BP begins to drop "...I can feel it.Marland KitchenMarland KitchenI just sit down so I'm safe and not gonna fall." Anticipated changes in roles/activities/participation: Little change anticipated if pt able to reach targeted mod i goals.  FAmily may need to provide some increased support on a temporary basis. Pt/family expectations/goals: "I just want to be able to get my strength back and get home.:  [employer]: None Premorbid Home Care/DME Agencies: Other (Comment) ([place] in past) Transportation available at discharge:  yes  Discharge Planning Living Arrangements: [place]: Children;Other relatives Type of Residence: Private residence Insurance Resources: Education officer, museum (specify) Nurse, mental health) Financial Resources: Radio broadcast assistant Screen Referred: No Living Expenses: Own Money Management: Patient Does the patient have any problems obtaining your medications?: No Home Management: pt Patient/Family Preliminary Plans: pt plans to return to her own home with family assisting intermittently Social Work Anticipated Follow Up Needs: HH/OP Expected length of stay: [facility]OS 7 days  Clinical Impression Very pleasant, elderly woman here following a fall with hip fx at beach.  Excellent family support and anticipating mod i goals.  Pt denies any emotional distress but will monitor.  To follow for assist with d/c needs.  Jeanny Rymer 08/20/2013, 1:03 PM

## 2013-08-24 NOTE — Progress Notes (Signed)
Performed EKG on patient per MD order.  Results shown to Dr. Naaman Plummer.  HR noted to jump anywhere from 53-120's.  Patient was sitting during procedure, tolerated well.  Will continue to monitor.  Brita Romp, RN

## 2013-08-24 NOTE — Progress Notes (Signed)
Occupational Therapy Session Note  Patient Details  Name: Anna Farrell MRN: [id number] Date of Birth: 03/08/1927  Today's Date: 08/24/2013 OT Individual Time: 0930-1030 OT Individual Time Calculation (min): 60 min   Short Term Goals: Week 1:  OT Short Term Goal 1 (Week 1): Pt will perform LB bathing with Mod A in order to increase I with self care.  OT Short Term Goal 2 (Week 1): Pt will perform toileting with Min A in order to increase I in self care.  OT Short Term Goal 3 (Week 1): Pt will perform toilet transfer with min A in order to increase I in functional transfers.  OT Short Term Goal 4 (Week 1): Pt will perform shower transfer with Min A in order to increase I with functional transfers. OT Short Term Goal 5 (Week 1): Pt will perform functional dynamic balance task for 5 minutes with Min A for balance in order to increase I with balance.  Skilled Therapeutic Interventions/Progress Updates: ADL-retraining with focus on improved activity tolerance, transfers, functional mobility using RW, toileting and adherence to posterior hip precautions during functional activities. Pt received seated in her recliner with left leg elevated on pillow.   With extra time, pt ambulated from recliner to w/c and elected to perform only upper body bathing/dressing and grooming (hair care) seated in w/c at sink during this session d/t recent BM with assist for lower body hygiene and TEDs.   Pt required only min assist (steadying) to rise from recliner with good compliance to hip precautions.  No LOB or episodes of light-headedness reported while ambulating 5' to w/c.   Pt completed upper body ADL with only setup assist but required extra time to clean dentures and to use shampoo cap (no rinse product) to wash and comb her hair.   Pt returned to recliner at end of session with call light and phone placed within reach.    Therapy Documentation Precautions:  Precautions Precautions: Posterior  Hip;Fall Precaution Booklet Issued: Yes (comment) Precaution Comments: pt verbalized 3/3 hip precautions.   Restrictions Weight Bearing Restrictions: Yes LLE Weight Bearing: Weight bearing as tolerated  Pain: Pain Assessment Pain Assessment: 0-10 Pain Score: 8  Pain Type: Surgical pain Pain Location: Leg Pain Orientation: Left Pain Descriptors / Indicators: Aching Pain Frequency: Intermittent Pain Onset: Gradual Patients Stated Pain Goal: 2 Pain Intervention(s): Medication (See eMAR)  See FIM for current functional status  Therapy/Group: Individual Therapy  Second session: Time: 9562-1308 Time Calculation (min):  31 min  Pain Assessment: No/denies pain  Skilled Therapeutic Interventions: ADL-retraining with focus on use of AE for lowe body dressing while sitting in recliner.   With extra time needed, pt doffed and donned socks using reacher and sock aid and donned shoes using reacher and elastic laces with min verbal cues to avoid left hip internal rotation and repeated instruction.   Pt did not require use of LH shoe horn as she was able to slide her feet into her shoes with socks removed while wearing TEDs.     See FIM for current functional status  Therapy/Group: Individual Therapy  [place] 08/24/2013, 12:44 PM

## 2013-08-25 ENCOUNTER — Inpatient Hospital Stay (HOSPITAL_COMMUNITY): Payer: Medicare Other | Admitting: Physical Therapy

## 2013-08-25 ENCOUNTER — Encounter (HOSPITAL_COMMUNITY): Payer: Medicare Other | Admitting: Occupational Therapy

## 2013-08-25 DIAGNOSIS — S72009A Fracture of unspecified part of neck of unspecified femur, initial encounter for closed fracture: Secondary | ICD-10-CM

## 2013-08-25 DIAGNOSIS — I951 Orthostatic hypotension: Secondary | ICD-10-CM

## 2013-08-25 DIAGNOSIS — Z5189 Encounter for other specified aftercare: Secondary | ICD-10-CM

## 2013-08-25 DIAGNOSIS — I4891 Unspecified atrial fibrillation: Secondary | ICD-10-CM

## 2013-08-25 LAB — PROTIME-INR
INR: 2.53 — ABNORMAL HIGH (ref 0.00–1.49)
PROTHROMBIN TIME: 27.3 s — AB (ref 11.6–15.2)

## 2013-08-25 NOTE — Progress Notes (Signed)
Physical Therapy Session Note (Note this is make up session)  Patient Details  Name: Anna Farrell MRN: [id number] Date of Birth: August 19, 1927  Today's Date: 08/25/2013 PT Individual Time: 1165 (make up session)-1601 PT Individual Time Calculation (min): 15 min   Short Term Goals: Week 1:  PT Short Term Goal 1 (Week 1): Pt will demonstrate bed mobility req min A PT Short Term Goal 2 (Week 1): Pt will demonstrate sit to stand req min A with RW PT Short Term Goal 3 (Week 1): Pt will ambulate 25' with RW req SBA PT Short Term Goal 4 (Week 1): Pt will ascend/descend 2 stairs with B rails req CGA PT Short Term Goal 5 (Week 1): Pt will demonstrate w/c propulsion x 100' req SBA with B UEs.   Skilled Therapeutic Interventions/Progress Updates:   Pt received lying in bed, having just transferred back to bed, but agreeable to make up session with supine therex.  Performed ankle pumps x 15 reps BLE, knee presses x 10 reps w/ cue for 5 sec hold BLE, LLE SLR x 10 reps (active assist), LLE hip abd x 10 reps, and LLE SAQ x 10 reps.  Pt tolerated well.  Discussed DC plan and need to be at mod I level before returning home.  Pt verbalized understanding.  Pt left in bed with ice pack on L arm due to bruising and swelling.  All needs in reach.    Therapy Documentation Precautions:  Precautions Precautions: Posterior Hip;Fall Precaution Booklet Issued: Yes (comment) Precaution Comments: pt verbalized 3/3 hip precautions.   Restrictions Weight Bearing Restrictions: Yes LLE Weight Bearing: Weight bearing as tolerated   Vital Signs: Therapy Vitals Temp: 98.5 F (36.9 C) Temp src: Oral Pulse Rate: 91 Resp: 18 BP: 129/72 mmHg Patient Position (if appropriate): Lying Oxygen Therapy SpO2: 98 % O2 Device: None (Room air) Pain: Pain Assessment Pain Assessment: 0-10 Pain Score: 6  Pain Type: Surgical pain Pain Location: Hip Pain Orientation: Left Pain Descriptors / Indicators: Radiating (down leg to  knee) Pain Onset: On-going Pain Intervention(s): Repositioned Multiple Pain Sites: No    See FIM for current functional status  Therapy/Group: Individual Therapy  Denice Bors 08/25/2013, 5:06 PM

## 2013-08-25 NOTE — Progress Notes (Signed)
Physical Therapy Session Note  Patient Details  Name: Anna Farrell MRN: [id number] Date of Birth: Jan 27, 1927  Today's Date: 08/25/2013 PT Individual Time: 0930-1030 Tx 2: 1400-1500 PT Individual Time Calculation (min): 60 min  Tx 2: 60 minutes  Short Term Goals: Week 1:  PT Short Term Goal 1 (Week 1): Pt will demonstrate bed mobility req min A PT Short Term Goal 2 (Week 1): Pt will demonstrate sit to stand req min A with RW PT Short Term Goal 3 (Week 1): Pt will ambulate 25' with RW req SBA PT Short Term Goal 4 (Week 1): Pt will ascend/descend 2 stairs with B rails req CGA PT Short Term Goal 5 (Week 1): Pt will demonstrate w/c propulsion x 100' req SBA with B UEs.   Skilled Therapeutic Interventions/Progress Updates:    Therapeutic Exercise: PT instructs pt in L LE strengthening and ROM exercises 10 x 2 sets: LAQ, seated march to 90 degrees hip fexion.  PT instructs pt in cervical and thoracic postural exercises to improve ROM towards normal posture x 20 reps: chin tucks.   Gait Training: PT instructs pt in ambulation with RW x 95' + 50' req SBA and verbal cues for increasing R step length and shoulder depression. Pt limited by fatigue.  PT instructs pt in ascending/descending 3 stairs with B rails req close SBA for safety in step-to pattern. Pt ascends with the strong leg, but alternates leading leg on descent.   Therapeutic Activity: PT instructs pt in sit to stand with RW req SBA and verbal cues for technique from recliner chair. PT instructs pt in sit to stand from w/c with RW req verbal cues to scoot forward and SBA for safety, utilizing armrests. PT instructs pt in toilet transfer req SBA with RW and BSC over toilet - pt manages pants and hygiene. PT instructs pt in sit to supine transfer req mod A and min A scoot in supine.   W/C Management: PT instructs pt in self propelling manual w/c with B UEs x 130' req SBA and slow pace due to decreased endurance.   Session 2: Therapeutic  Activity: PT instructs pt in toilet transfer req SBA with RW - pt manages pants and hygiene. PT instructs pt in sit to supine transfer with rails, focusing on scooting backwards method to assist in getting L leg onto bed, but pt continues to req min A for L LE.  PT instructs pt in car transfer with RW req min A for L LE assist and verbal cues for technique.  PT instructs pt in sit to supine transfer without 1 rail (with far rail) req min A for L LE onto bed.   Therapeutic Exercise: PT instructs pt in standing spinal extension exercise/arching x 20 reps with RW, verbal cues to depress shoulders and tactile cues to encourage extension of thoracic/lumbar spine, as opposed to all movement occuring in neck.  PT instructs pt in L LE ROM/strengthening exercises in bed: heel slides, bridges, hip ER in B hook lie, supine hip abduction/adduction to neutral, ankle pumps, SLR AAROM as needed x 10-20 reps each.   Gait Training: PT instructs pt in ambulation with RW x 140' req SBA and verbal cues for upright posture.   Pt is progressing well with physical therapy, but will continue to benefit from endurance/activity tolerance training, safety with gait on level surfaces and stairs, continued car transfers, L LE strengthening/ROM training, and balance reeducation.      Therapy Documentation Precautions:  Precautions Precautions:  Posterior Hip;Fall Precaution Booklet Issued: Yes (comment) Precaution Comments: pt verbalized 3/3 hip precautions.   Restrictions Weight Bearing Restrictions: Yes LLE Weight Bearing: Weight bearing as tolerated    Vital Signs: Therapy Vitals Pulse Rate: 88 BP: 126/83 mmHg Patient Position (if appropriate): Sitting Oxygen Therapy SpO2: 99 % O2 Device: None (Room air) Pain: Pain Assessment Pain Assessment: 0-10 Pain Score: 3  Pain Type: Chronic pain Pain Location: Knee Pain Orientation: Left Pain Descriptors / Indicators: Aching Pain Onset: On-going Pain  Intervention(s): Repositioned Multiple Pain Sites: Yes 2nd Pain Site Pain Score: 7 Pain Type: Chronic pain Pain Location: Back Pain Orientation: Mid;Right;Left Pain Descriptors / Indicators: Aching Pain Onset: With Activity Pain Intervention(s): Rest;Repositioned Tx 2: Pt c/o 6/10 L hip pain, radiating down to knee. PT repositions pt to decrease pain.   See FIM for current functional status  Therapy/Group: Individual Therapy  Jaymien Landin M 08/25/2013, 9:46 AM

## 2013-08-25 NOTE — Progress Notes (Signed)
ANTICOAGULATION CONSULT NOTE - Follow Up Consult  Pharmacy Consult for coumadin Indication: atrial fibrillation  Allergies  Allergen Reactions  . Betadine [Povidone Iodine]     burning  . Codeine Nausea And Vomiting    Passed out    Patient Measurements: Height: 5\' 2"  (157.5 cm) Weight: 146 lb 6.2 oz (66.4 kg) IBW/kg (Calculated) : 50.1 Heparin Dosing Weight:   Vital Signs: Temp: 97.9 F (36.6 C) (08/19 0450) Temp src: Oral (08/19 0450) BP: 121/84 mmHg (08/19 0450) Pulse Rate: 78 (08/19 0450)  Labs:  Recent Labs  08/22/13 0839 08/23/13 0707 08/23/13 1520 08/25/13 0601  HGB  --   --  11.2*  --   HCT  --   --  35.6*  --   PLT  --   --  369  --   LABPROT 27.8* 26.1*  --  27.3*  INR 2.59* 2.39*  --  2.53*  CREATININE  --  0.58  --   --     Estimated Creatinine Clearance: 47.6 ml/min (by C-G formula based on Cr of 0.58).   Medications:  Scheduled:  . cholecalciferol  2,000 Units Oral Daily  . feeding supplement (ENSURE COMPLETE)  237 mL Oral BID BM  . fluticasone  2 puff Inhalation BID  . iron polysaccharides  150 mg Oral Q breakfast  . metoprolol succinate  50 mg Oral BID  . polyethylene glycol  17 g Oral BID  . simvastatin  20 mg Oral q1800  . traMADol  50 mg Oral 4 times per day  . vitamin B-12  100 mcg Oral Weekly  . warfarin  3 mg Oral Once per day on Sun Mon Tue Wed Fri Sat  . [START ON 08/26/2013] warfarin  4.5 mg Oral Once per day on Thu  . Warfarin - Pharmacist Dosing Inpatient   Does not apply q1800   Infusions:    Assessment: 80 yo female with afib is currently on therapeutic coumadin.  INR today is 2.53. Goal of Therapy:  INR 2-3 Monitor platelets by anticoagulation protocol: Yes   Plan:  - cont coumadin 3mg  po daily except 4.5 mg on Thursdays - MWF INR  Anna Farrell, Anna Farrell 08/25/2013,8:07 AM

## 2013-08-25 NOTE — Progress Notes (Signed)
Patient Name: Anna Farrell Date of Encounter: 08/25/2013     Principal Problem:   Hip fracture requiring operative repair Active Problems:   HYPERTENSION   Chronic atrial fibrillation   BRADYCARDIA-TACHYCARDIA SYNDROME   Orthostatic hypotension   Asymptomatic PVCs   Cardiac pacemaker in situ   Hyperlipidemia   Acute blood loss anemia   Possible acute diastolic heart failure    SUBJECTIVE  Patient seen during PT exercise. Denies any CP or SOB. Felt a little whoozy when changing from sitting to standing position earlier.  CURRENT MEDS . cholecalciferol  2,000 Units Oral Daily  . feeding supplement (ENSURE COMPLETE)  237 mL Oral BID BM  . fluticasone  2 puff Inhalation BID  . iron polysaccharides  150 mg Oral Q breakfast  . metoprolol succinate  50 mg Oral BID  . polyethylene glycol  17 g Oral BID  . simvastatin  20 mg Oral q1800  . traMADol  50 mg Oral 4 times per day  . vitamin B-12  100 mcg Oral Weekly  . warfarin  3 mg Oral Once per day on Sun Mon Tue Wed Fri Sat  . [START ON 08/26/2013] warfarin  4.5 mg Oral Once per day on Thu  . Warfarin - Pharmacist Dosing Inpatient   Does not apply q1800    OBJECTIVE  Filed Vitals:   08/25/13 0450 08/25/13 0904 08/25/13 0921 08/25/13 0939  BP: 121/84 91/61 101/61 126/83  Pulse: 78 100  88  Temp: 97.9 F (36.6 C)     TempSrc: Oral     Resp: 18     Height:      Weight:      SpO2:    99%    Intake/Output Summary (Last 24 hours) at 08/25/13 1018 Last data filed at 08/25/13 0800  Gross per 24 hour  Intake    720 ml  Output      0 ml  Net    720 ml   Filed Weights   08/19/13 1714  Weight: 146 lb 6.2 oz (66.4 kg)    PHYSICAL EXAM  General: Pleasant, NAD. Neuro: Alert and oriented X 3. Moves all extremities spontaneously. Psych: Normal affect. HEENT:  Normal  Neck: Supple without bruits or JVD. Lungs:  Resp regular and unlabored, CTA. Heart: irregular no s3, s4, or murmurs. Abdomen: Soft, non-tender,  non-distended, BS + x 4.  Extremities: No clubbing, cyanosis or edema. DP/PT/Radials 2+ and equal bilaterally.  Accessory Clinical Findings  CBC  Recent Labs  08/23/13 1520  WBC 7.0  HGB 11.2*  HCT 35.6*  MCV 88.1  PLT 287   Basic Metabolic Panel  Recent Labs  08/23/13 0707  NA 143  K 4.8  CL 103  CO2 30  GLUCOSE 105*  BUN 7  CREATININE 0.58  CALCIUM 9.6    TELE Not on tele    ECG  Atrial fibrillation with HR 108  Echocardiogram  LV EF: 55% - 60%  ------------------------------------------------------------ Indications: Atrial fibrillation - 427.31. 427.81, SA node dysfunction. Chest pain 786.51.  ------------------------------------------------------------ History: PMH: SA node dysfunction. Acquired from the patient and from the patient's chart. Chest pain. Fatigue and orthostatic hypotension. Atrial fibrillation. Risk factors: Hypertension. Dyslipidemia.  ------------------------------------------------------------ Study Conclusions  - Left ventricle: The cavity size was normal. Systolic function was normal. The estimated ejection fraction was in the range of 55% to 60%. Wall motion was normal; there were no regional wall motion abnormalities. - Aortic valve: Moderate thickening and calcification. - Aorta: Ascending  aortic diameter: 26mm (S). - Ascending aorta: The ascending aorta was mildly dilated. - Mitral valve: Calcified annulus. - Left atrium: The atrium was mildly dilated.      Radiology/Studies  Dg Chest 1 View  08/15/2013   CLINICAL DATA:  80 year old female with hip fracture. Preoperative respiratory examination. Fall with left-sided pain.  EXAM: CHEST - 1 VIEW  COMPARISON:  04/06/2013  FINDINGS: Cardiomegaly and left-sided pacemaker again noted.  There is no evidence of focal airspace disease, pulmonary edema, suspicious pulmonary nodule/mass, pleural effusion, or pneumothorax. No acute bony abnormalities are identified.  IMPRESSION:  Cardiomegaly without evidence of active cardiopulmonary disease.   Electronically Signed   By: Hassan Rowan M.D.   On: 08/15/2013 15:56   Dg Chest 2 View  08/23/2013   CLINICAL DATA:  Post hip surgery.  Shortness of breath.  EXAM: CHEST  2 VIEW  COMPARISON:  08/19/2013  FINDINGS: Tortuosity of the thoracic aorta. This projects posteriorly on the lateral view and may be the cause of posterior basilar density on prior chest x-ray. No confluent airspace opacities or effusions. Heart is upper limits normal in size. Left pacer is unchanged. No acute bony abnormality.  IMPRESSION: No active cardiopulmonary disease.   Electronically Signed   By: Rolm Baptise M.D.   On: 08/23/2013 12:53   Dg Chest 2 View  08/19/2013   CLINICAL DATA:  Fever  EXAM: CHEST  2 VIEW  COMPARISON:  Chest radiograph 08/18/2013  FINDINGS: Dual lead pacer apparatus overlies the left hemi thorax, leads are stable in position. Stable enlarged cardiac and mediastinal contours with tortuosity of the thoracic aorta. Heterogeneous opacities demonstrated within the thorax overlying the spine on the lateral view. No pleural effusion or pneumothorax. Mid thoracic spine degenerative change.  IMPRESSION: Heterogeneous opacities overlying the spine on the lateral view may be secondary to atelectasis or infection. Short-term followup radiograph is recommended to ensure resolution.   Electronically Signed   By: Lovey Newcomer M.D.   On: 08/19/2013 20:15   Dg Hip Complete Left  08/15/2013   CLINICAL DATA:  Hip pain.  Fell.  EXAM: LEFT HIP - COMPLETE 2+ VIEW  COMPARISON:  None.  FINDINGS: There is an impacted subcapital LEFT hip fracture. Osteopenia is noted. No pelvic fractures are seen. There is moderate stool burden. Buttock granuloma.  IMPRESSION: Impacted subcapital LEFT hip fracture.   Electronically Signed   By: Rolla Flatten M.D.   On: 08/15/2013 15:56   Dg Pelvis Portable  08/17/2013   CLINICAL DATA:  Postop left total hip arthroplasty.  EXAM: PORTABLE  PELVIS 1-2 VIEWS  COMPARISON:  CT 04/06/2013  FINDINGS: Exam demonstrates mild diffuse decreased bone mineralization. There are mild degenerative changes of the right hip. There is evidence of patient's left total hip arthroplasty which appears adequately located and intact. Remainder of the exam is unchanged.  IMPRESSION: Recent left total hip arthroplasty intact.  Mild degenerative change right hip.   Electronically Signed   By: Marin Olp M.D.   On: 08/17/2013 09:43   Dg Chest Port 1 View  08/18/2013   CLINICAL DATA:  Shortness of breath following recent left hip arthroplasty. Fever.  EXAM: PORTABLE CHEST - 1 VIEW  COMPARISON:  08/15/2013 and 04/06/2013.  FINDINGS: 0739 hr. Left subclavian AV sequential pacemaker leads are unchanged within the right atrium and right ventricle. There is stable mild cardiomegaly and aortic atherosclerosis. There are lower lung volumes with resulting patchy bibasilar opacities, likely atelectasis. In this clinical setting, early pneumonia cannot  be excluded although there is no consolidation. There is no pleural effusion or pneumothorax. The osseous structures appear unchanged.  IMPRESSION: New patchy bibasilar opacities, likely atelectasis.   Electronically Signed   By: Camie Patience M.D.   On: 08/18/2013 08:28    ASSESSMENT AND PLAN  1. atrial fibrillation  - HR and BP stable overnight, no further medication adjustment necessary at this time  - continue BB for rate control, currently on long active metoprolol BID dosing  2. status post pacemaker   3. orthostatic hypotension  - norvasc discontinued to allow blood pressure to run higher  4. status post repair of left hip fracture-management per orthopedics.   5 hypertension  Pending urine culture final result, staph species noted, afebrile  Signed, Almyra Deforest PA-C Pager: [phone]   Personally seen and examined. Agree with above. AFIB chronic stable. May have periods of elevated HR. Overall well  controlled.  OK with BP running mildly elevated to prevent orthostatics.  Will sign off. Please call with questions.  Candee Furbish, MD

## 2013-08-25 NOTE — Progress Notes (Signed)
Social Work Patient ID: [name], female   DOB: August 19, 1927, 80 y.o.   MRN: [id number]   Lennart Pall, LCSW Social Worker Signed  Patient Care Conference Service date: 08/25/2013 9:34 AM  Inpatient RehabilitationTeam Conference and Plan of Care Update Date: 08/24/2013   Time: 2:25 PM     Patient Name: Anna Farrell       Medical Record Number: [id number]   Date of Birth: 21-Jun-1927 Sex: Female         Room/Bed: 4M06C/4M06C-01 Payor Info: Payor: MEDICARE / Plan: MEDICARE PART A AND B / Product Type: *No Product type* /   Admitting Diagnosis: FM FX NO SIP   Admit Date/Time:  08/19/2013  4:38 PM Admission Comments: No comment available   Primary Diagnosis:  <principal problem not specified> Principal Problem: <principal problem not specified>    Patient Active Problem List     Diagnosis  Date Noted   .  Drug-induced constipation  08/19/2013   .  Catheter-associated urinary tract infection  08/19/2013   .  Acute respiratory failure with hypoxia  08/19/2013   .  Possible acute diastolic heart failure  11/05/1312   .  Hip fracture requiring operative repair  08/19/2013   .  Hypokalemia  08/18/2013   .  Acute blood loss anemia  08/18/2013   .  CAP (community acquired pneumonia)  08/18/2013   .  Left displaced femoral neck fracture  08/15/2013   .  Hyperlipidemia  08/15/2013   .  Chest pain  04/18/2013   .  Abdominal pain  04/06/2013   .  Acute cholangitis  04/06/2013   .  SIRS (systemic inflammatory response syndrome)  04/06/2013   .  Common bile duct dilatation  04/06/2013   .  Transaminitis  04/06/2013   .  Cardiac pacemaker in situ  04/06/2013   .  Pancreatitis, acute  04/06/2013   .  Encounter for therapeutic drug monitoring  02/25/2013   .  Asymptomatic PVCs     .  Orthostatic hypotension  12/16/2012   .  Extrinsic asthma, unspecified  02/27/2009   .  DYSLIPIDEMIA  07/09/2007   .  HYPERTENSION  07/09/2007   .  Chronic atrial fibrillation  07/09/2007   .   BRADYCARDIA-TACHYCARDIA SYNDROME  07/09/2007   .  VASOVAGAL SYNCOPE  07/09/2007     Expected Discharge Date: Expected Discharge Date: 08/31/13  Team Members Present: Physician leading conference: Dr. Alger Simons Social Worker Present: Lennart Pall, LCSW Nurse Present: Dorien Chihuahua, RN PT Present: Raylene Everts, PT;Jess Anastasia Fiedler, PT OT Present: Salome Spotted, OT SLP Present: Weston Anna, SLP PPS Coordinator present : Ileana Ladd, PT        Current Status/Progress  Goal  Weekly Team Focus   Medical     left femur fracture, orthostatic hypotension with afib---has been limiting PT participation  improve activity tolerance and endurance  improve bp mgt/stability to allow more therapy participation   Bowel/Bladder     Continent of bowel and bladder; has urgency; wears brief at night for periods of incontinence PVR < 150; LBM 8/17  Remain continent of bowel and bladder with min assist  offer toileting q2h; continue to measure PVR   Swallow/Nutrition/ Hydration     Encouraging po fluids; appetite adequate       ADL's     Min Assist for ADL, supervision for transfers  Mod I for seated BADL, Supervision for transfers and complex dynamic standing balance  Pain management, AE training, endurance,  transfers   Mobility     modA for bed mobility, transfers. upright mobility limited 2/2 orthostasis  mod (I) from w/c level w/ S for short distance ambulation and stairs  upright tolerance, LLE strength, mobility strategies to maintain precautions   Communication            Safety/Cognition/ Behavioral Observations    Calls appropriately for assistance; no unsafe behaviors witnessed  supervision  hourly round   Pain     pain managed well with prn po pain medications  < 4  Assess and treat for pain q shift and prn   Skin     Left hip incision with few steri strips; clean and dry  No skin infection or breakdown with min assist  Assess skin q shift and prn; encourage turning patient in bed to  prevent pressure ulcers.    Rehab Goals Patient on target to meet rehab goals: Yes Rehab Goals Revised: NA *See Care Plan and progress notes for long and short-term goals.    Barriers to Discharge:  bp, pain      Possible Resolutions to Barriers:    bp/HR management, cards consult      Discharge Planning/Teaching Needs:    Home with family to provide any assistance needed.  Plans ot return to her own home with intermittent check-ins from family but more assist could be arranged.   Ongoing    Team Discussion:    Still with some urinary retention.  Appreciated card consult (see progress notes).  Pt very determined with tx and working hard, however, endurance is primary barrier.  Goals of mod i overall and currently min assist.     Revisions to Treatment Plan:    None    Continued Need for Acute Rehabilitation Level of Care: The patient requires daily medical management by a physician with specialized training in physical medicine and rehabilitation for the following conditions: Daily direction of a multidisciplinary physical rehabilitation program to ensure safe treatment while eliciting the highest outcome that is of practical value to the patient.: Yes Daily medical management of patient stability for increased activity during participation in an intensive rehabilitation regime.: Yes Daily analysis of laboratory values and/or radiology reports with any subsequent need for medication adjustment of medical intervention for : Cardiac problems;Post surgical problems  Sadaf Przybysz 08/25/2013, 9:34 AM

## 2013-08-25 NOTE — Progress Notes (Signed)
Pt refused cpap for tonight. Pt knows she can call if she needs any assistance or changes her mind. RT will continue to monitor.

## 2013-08-25 NOTE — Patient Care Conference (Signed)
Inpatient RehabilitationTeam Conference and Plan of Care Update Date: 08/24/2013   Time: 2:25 PM    Patient Name: Anna Farrell      Medical Record Number: [id number]  Date of Birth: 07-09-1927 Sex: Female         Room/Bed: 4M06C/4M06C-01 Payor Info: Payor: MEDICARE / Plan: MEDICARE PART A AND B / Product Type: *No Product type* /    Admitting Diagnosis: FM FX NO SIP  Admit Date/Time:  08/19/2013  4:38 PM Admission Comments: No comment available   Primary Diagnosis:  <principal problem not specified> Principal Problem: <principal problem not specified>  Patient Active Problem List   Diagnosis Date Noted  . Drug-induced constipation 08/19/2013  . Catheter-associated urinary tract infection 08/19/2013  . Acute respiratory failure with hypoxia 08/19/2013  . Possible acute diastolic heart failure 32/44/0102  . Hip fracture requiring operative repair 08/19/2013  . Hypokalemia 08/18/2013  . Acute blood loss anemia 08/18/2013  . CAP (community acquired pneumonia) 08/18/2013  . Left displaced femoral neck fracture 08/15/2013  . Hyperlipidemia 08/15/2013  . Chest pain 04/18/2013  . Abdominal pain 04/06/2013  . Acute cholangitis 04/06/2013  . SIRS (systemic inflammatory response syndrome) 04/06/2013  . Common bile duct dilatation 04/06/2013  . Transaminitis 04/06/2013  . Cardiac pacemaker in situ 04/06/2013  . Pancreatitis, acute 04/06/2013  . Encounter for therapeutic drug monitoring 02/25/2013  . Asymptomatic PVCs   . Orthostatic hypotension 12/16/2012  . Extrinsic asthma, unspecified 02/27/2009  . DYSLIPIDEMIA 07/09/2007  . HYPERTENSION 07/09/2007  . Chronic atrial fibrillation 07/09/2007  . BRADYCARDIA-TACHYCARDIA SYNDROME 07/09/2007  . VASOVAGAL SYNCOPE 07/09/2007    Expected Discharge Date: Expected Discharge Date: 08/31/13  Team Members Present: Physician leading conference: Dr. Alger Simons Social Worker Present: Lennart Pall, LCSW Nurse Present: Dorien Chihuahua, RN PT  Present: Raylene Everts, PT;Jess Anastasia Fiedler, PT OT Present: Salome Spotted, OT SLP Present: Weston Anna, SLP PPS Coordinator present : Ileana Ladd, PT     Current Status/Progress Goal Weekly Team Focus  Medical   left femur fracture, orthostatic hypotension with afib---has been limiting PT participation  improve activity tolerance and endurance  improve bp mgt/stability to allow more therapy participation   Bowel/Bladder   Continent of bowel and bladder; has urgency; wears brief at night for periods of incontinence PVR < 150; LBM 8/17  Remain continent of bowel and bladder with min assist  offer toileting q2h; continue to measure PVR   Swallow/Nutrition/ Hydration   Encouraging po fluids; appetite adequate         ADL's   Min Assist for ADL, supervision for transfers  Mod I for seated BADL, Supervision for transfers and complex dynamic standing balance  Pain management, AE training, endurance, transfers   Mobility   modA for bed mobility, transfers. upright mobility limited 2/2 orthostasis  mod (I) from w/c level w/ S for short distance ambulation and stairs  upright tolerance, LLE strength, mobility strategies to maintain precautions   Communication             Safety/Cognition/ Behavioral Observations  Calls appropriately for assistance; no unsafe behaviors witnessed  supervision  hourly round   Pain   pain managed well with prn po pain medications  < 4  Assess and treat for pain q shift and prn   Skin   Left hip incision with few steri strips; clean and dry  No skin infection or breakdown with min assist  Assess skin q shift and prn; encourage turning patient in bed to prevent pressure ulcers.  Rehab Goals Patient on target to meet rehab goals: Yes Rehab Goals Revised: NA *See Care Plan and progress notes for long and short-term goals.  Barriers to Discharge: bp, pain    Possible Resolutions to Barriers:  bp/HR management, cards consult    Discharge Planning/Teaching  Needs:  Home with family to provide any assistance needed.  Plans ot return to her own home with intermittent check-ins from family but more assist could be arranged.  Ongoing   Team Discussion:  Still with some urinary retention.  Appreciated card consult (see progress notes).  Pt very determined with tx and working hard, however, endurance is primary barrier.  Goals of mod i overall and currently min assist.    Revisions to Treatment Plan:  None   Continued Need for Acute Rehabilitation Level of Care: The patient requires daily medical management by a physician with specialized training in physical medicine and rehabilitation for the following conditions: Daily direction of a multidisciplinary physical rehabilitation program to ensure safe treatment while eliciting the highest outcome that is of practical value to the patient.: Yes Daily medical management of patient stability for increased activity during participation in an intensive rehabilitation regime.: Yes Daily analysis of laboratory values and/or radiology reports with any subsequent need for medication adjustment of medical intervention for : Cardiac problems;Post surgical problems  Lambert Jeanty 08/25/2013, 9:34 AM

## 2013-08-25 NOTE — Progress Notes (Signed)
Occupational Therapy Session Note  Patient Details  Name: [name] MRN: [id number] Date of Birth: April 22, 1927  Today's Date: 08/25/2013 OT Individual Time: 0800-0900 OT Individual Time Calculation (min): 60 min   Short Term Goals: Week 1:  OT Short Term Goal 1 (Week 1): Pt will perform LB bathing with Mod A in order to increase I with self care.  OT Short Term Goal 2 (Week 1): Pt will perform toileting with Min A in order to increase I in self care.  OT Short Term Goal 3 (Week 1): Pt will perform toilet transfer with min A in order to increase I in functional transfers.  OT Short Term Goal 4 (Week 1): Pt will perform shower transfer with Min A in order to increase I with functional transfers. OT Short Term Goal 5 (Week 1): Pt will perform functional dynamic balance task for 5 minutes with Min A for balance in order to increase I with balance.  Skilled Therapeutic Interventions/Progress Updates:    1:1 self care retraining at sink level- pt's choice. Focus on sit to stands, standing balance with and without UE support, use of AE for LB bathing and dressing to increase independence, basic stand pivot transfers with RW steady A to supervision, activity tolerance, maintaining hip precautions with activity. After completed bathign and dressing at sink - transitioned to ADL apartment bathroom and practiced tub bench transfer into tub with min A for management of left LE. Pt reports she may have a tub bench at home in the garage or attic and will ask family to see if they can locate it. Ambulated in and out of bathroom for toileting with steady A to close supervision.   Vitals in sitting at end of session 91/61 with TEDS and abdominal binder on- standing unable to get a reading.  Therapy Documentation Precautions:  Precautions Precautions: Posterior Hip;Fall Precaution Booklet Issued: Yes (comment) Precaution Comments: pt verbalized 3/3 hip precautions.   Restrictions Weight Bearing  Restrictions: Yes LLE Weight Bearing: Weight bearing as tolerated Pain: 4/10- provided rest throughout session prn See FIM for current functional status  Therapy/Group: Individual Therapy  Willeen Cass [facility] 08/25/2013, 8:50 AM

## 2013-08-25 NOTE — Progress Notes (Signed)
Subjective/Complaints: 80 y.o. female with history of SSS, A fib with PPM, orthostatic hypotension, Right knee OA, OSA (new to CPAP); who slipped and fell while in [place] on on 08/15/13 with subsequent displaced left femoral neck fracture. She was transferred to [facility] per family request and was evaluated by Dr. Alain Marion. Chronic coumadin reversed and patient underwent left hip bipolar hemi on 08/17/13. Post op is WBAT and coumadin resumed  Feels better this morning. States she was feeling better by the afternoon yesterday.  Review of Systems - Negative except Left hip pain and urinary retention  Objective: Vital Signs: Blood pressure 121/84, pulse 78, temperature 97.9 F (36.6 C), temperature source Oral, resp. rate 18, height _0  (1.575 m), weight 66.4 kg (146 lb 6.2 oz), SpO2 97.00%. Dg Chest 2 View  08/23/2013   CLINICAL DATA:  Post hip surgery.  Shortness of breath.  EXAM: CHEST  2 VIEW  COMPARISON:  08/19/2013  FINDINGS: Tortuosity of the thoracic aorta. This projects posteriorly on the lateral view and may be the cause of posterior basilar density on prior chest x-ray. No confluent airspace opacities or effusions. Heart is upper limits normal in size. Left pacer is unchanged. No acute bony abnormality.  IMPRESSION: No active cardiopulmonary disease.   Electronically Signed   By: Rolm Baptise M.D.   On: 08/23/2013 12:53   Results for orders placed during the hospital encounter of 08/19/13 (from the past 72 hour(s))  PROTIME-INR     Status: Abnormal   Collection Time    08/22/13  8:39 AM      Result Value Ref Range   Prothrombin Time 27.8 (*) 11.6 - 15.2 seconds   INR 2.59 (*) 0.00 - 5.70  BASIC METABOLIC PANEL     Status: Abnormal   Collection Time    08/23/13  7:07 AM      Result Value Ref Range   Sodium 143  137 - 147 mEq/L   Potassium 4.8  3.7 - 5.3 mEq/L   Chloride 103  96 - 112 mEq/L   CO2 30  19 - 32 mEq/L   Glucose, Bld 105 (*) 70 - 99 mg/dL   BUN 7  6 - 23 mg/dL    Creatinine, Ser 0.58  0.50 - 1.10 mg/dL   Calcium 9.6  8.4 - 10.5 mg/dL   GFR calc non Af Amer 83 (*) >90 mL/min   GFR calc Af Amer >90  >90 mL/min   Comment: (NOTE)     The eGFR has been calculated using the CKD EPI equation.     This calculation has not been validated in all clinical situations.     eGFR's persistently <90 mL/min signify possible Chronic Kidney     Disease.   Anion gap 10  5 - 15  PROTIME-INR     Status: Abnormal   Collection Time    08/23/13  7:07 AM      Result Value Ref Range   Prothrombin Time 26.1 (*) 11.6 - 15.2 seconds   INR 2.39 (*) 0.00 - 1.49  URINE CULTURE     Status: None   Collection Time    08/23/13  2:08 PM      Result Value Ref Range   Specimen Description URINE, CLEAN CATCH     Special Requests NONE     Culture  Setup Time       Value: 08/23/2013 14:27     Performed at [place]  Value: 15,000 COLONIES/ML     Performed at [facility]   Culture       Value: STAPHYLOCOCCUS SPECIES     Note: RIFAMPIN AND GENTAMICIN SHOULD NOT BE USED AS SINGLE DRUGS FOR TREATMENT OF STAPH INFECTIONS.     Performed at [facility]   Report Status PENDING    CBC     Status: Abnormal   Collection Time    08/23/13  3:20 PM      Result Value Ref Range   WBC 7.0  4.0 - 10.5 K/uL   RBC 4.04  3.87 - 5.11 MIL/uL   Hemoglobin 11.2 (*) 12.0 - 15.0 g/dL   HCT 35.6 (*) 36.0 - 46.0 %   MCV 88.1  78.0 - 100.0 fL   MCH 27.7  26.0 - 34.0 pg   MCHC 31.5  30.0 - 36.0 g/dL   RDW 16.2 (*) 11.5 - 15.5 %   Platelets 369  150 - 400 K/uL  PROTIME-INR     Status: Abnormal   Collection Time    08/25/13  6:01 AM      Result Value Ref Range   Prothrombin Time 27.3 (*) 11.6 - 15.2 seconds   INR 2.53 (*) 0.00 - 1.49     HEENT: normal Cardio: irregular, 90's Resp: CTA B/L and unlabored GI: BS positive and NT,ND Extremity:  Pulses positive and trace LLE Edema Skin:   Intact and Wound without sutures or staples Neuro:  Alert/Oriented and Abnormal Motor 5/5 in BUE, 4/5 RLE, trace Left HF, KE, 4- ADF Musc/Skel:  Extremity tender Left lateral thigh Gen NAD   Assessment/Plan: 1. Functional deficits secondary to Left femoral neck fx which require 3+ hours per day of interdisciplinary therapy in a comprehensive inpatient rehab setting. Physiatrist is providing close team supervision and 24 hour management of active medical problems listed below. Physiatrist and rehab team continue to assess barriers to discharge/monitor patient progress toward functional and medical goals.    FIM: FIM - Bathing Bathing Steps Patient Completed: Chest;Right Arm;Left Arm;Abdomen Bathing: 5: Set-up assist to: Obtain items  FIM - Upper Body Dressing/Undressing Upper body dressing/undressing steps patient completed: Thread/unthread right sleeve of pullover shirt/dresss;Thread/unthread left sleeve of pullover shirt/dress;Put head through opening of pull over shirt/dress;Pull shirt over trunk Upper body dressing/undressing: 6: More than reasonable amount of time FIM - Lower Body Dressing/Undressing Lower body dressing/undressing steps patient completed: Thread/unthread right underwear leg;Thread/unthread right pants leg;Pull pants up/down Lower body dressing/undressing: 0: Activity did not occur  FIM - Toileting Toileting steps completed by patient: Adjust clothing prior to toileting;Performs perineal hygiene;Adjust clothing after toileting Toileting: 5: Set-up assist to: Obtain supplies (using reacher, setup with donning binder)  FIM - Radio producer Devices: Environmental consultant;Bedside commode Toilet Transfers: 4-To toilet/BSC: Min A (steadying Pt. > 75%);4-From toilet/BSC: Min A (steadying Pt. > 75%)  FIM - Control and instrumentation engineer Devices: Walker;Arm rests;HOB elevated;Bed rails Bed/Chair Transfer: 3: Supine > Sit: Mod A (lifting assist/Pt. 50-74%/lift 2 legs;4: Bed > Chair or W/C: Min  A (steadying Pt. > 75%);4: Chair or W/C > Bed: Min A (steadying Pt. > 75%)  FIM - Locomotion: Wheelchair Distance: 50 Locomotion: Wheelchair: 2: Travels 50 - 149 ft with supervision, cueing or coaxing FIM - Locomotion: Ambulation Locomotion: Ambulation Assistive Devices: Administrator Ambulation/Gait Assistance: 4: Min assist Locomotion: Ambulation: 2: Travels 50 - 149 ft with minimal assistance (Pt.>75%)  Comprehension Comprehension Mode: Auditory Comprehension: 5-Understands complex 90% of the time/Cues <  10% of the time  Expression Expression Mode: Verbal Expression: 5-Expresses complex 90% of the time/cues < 10% of the time  Social Interaction Social Interaction: 5-Interacts appropriately 90% of the time - Needs monitoring or encouragement for participation or interaction.  Problem Solving Problem Solving: 5-Solves basic 90% of the time/requires cueing < 10% of the time  Memory Memory: 4-Recognizes or recalls 75 - 89% of the time/requires cueing 10 - 24% of the time  Medical Problem List and Plan:  1. Functional deficits secondary to displaced left femoral neck fracture.  2. DVT Prophylaxis/Anticoagulation: Pharmaceutical: Coumadin  3. Pain Management: Will add voltaren gel for right knee. K pad for lumbago. Continue oxycodone prn  4. Mood: Team to provide ego support. LCSW to follow for evaluation and support.  5. Neuropsych: This patient is capable of making decisions on her own behalf.  6. Skin/Wound Care: Routine pressure relief measures. Keep heels elevated .  7. Urinary retention: still with frequency and retention  -ucx neg, re-check pending   -off abx  -continue to check PVR's 8. Constipation:   Increased miralax to bid. Beginning to move bowels better  9. SSS with PPM/ A fib:   -EKG with afib  - coumadin.   -norvasc stopped again by cards  -appreciate cards assist  -metoprolol increased to 19m bid  -continue to push fluids.   -abd binder/THT's 10 Post  op fever: IS. Resolved    11. Hypokalemia: supplemented  .    LOS (Days) 6 A FACE TO FACE EVALUATION WAS PERFORMED  SWARTZ,ZACHARY T 08/25/2013, 8:02 AM

## 2013-08-26 ENCOUNTER — Inpatient Hospital Stay (HOSPITAL_COMMUNITY): Payer: Medicare Other | Admitting: Physical Therapy

## 2013-08-26 ENCOUNTER — Encounter (HOSPITAL_COMMUNITY): Payer: Medicare Other

## 2013-08-26 ENCOUNTER — Inpatient Hospital Stay (HOSPITAL_COMMUNITY): Payer: Medicare Other | Admitting: Occupational Therapy

## 2013-08-26 LAB — URINE CULTURE: Colony Count: 15000

## 2013-08-26 NOTE — Progress Notes (Signed)
Subjective/Complaints: 80 y.o. female with history of SSS, A fib with PPM, orthostatic hypotension, Right knee OA, OSA (new to CPAP); who slipped and fell while in [place] on on 08/15/13 with subsequent displaced left femoral neck fracture. She was transferred to [facility] per family request and was evaluated by Dr. Alain Marion. Chronic coumadin reversed and patient underwent left hip bipolar hemi on 08/17/13. Post op is WBAT and coumadin resumed  Had a good day yesterday. Had some dizziness early, but it was milder.   Review of Systems - Negative except Left hip pain and urinary retention  Objective: Vital Signs: Blood pressure 126/73, pulse 90, temperature 98.4 F (36.9 C), temperature source Oral, resp. rate 18, height 5\' 2"  (1.575 m), weight 66.4 kg (146 lb 6.2 oz), SpO2 100.00%. No results found. Results for orders placed during the hospital encounter of 08/19/13 (from the past 72 hour(s))  URINE CULTURE     Status: None   Collection Time    08/23/13  2:08 PM      Result Value Ref Range   Specimen Description URINE, CLEAN CATCH     Special Requests NONE     Culture  Setup Time       Value: 08/23/2013 14:27     Performed at [place]       Value: 15,000 COLONIES/ML     Performed at [facility]   Culture       Value: STAPHYLOCOCCUS SPECIES     Note: RIFAMPIN AND GENTAMICIN SHOULD NOT BE USED AS SINGLE DRUGS FOR TREATMENT OF STAPH INFECTIONS.     Performed at [facility]   Report Status PENDING    CBC     Status: Abnormal   Collection Time    08/23/13  3:20 PM      Result Value Ref Range   WBC 7.0  4.0 - 10.5 K/uL   RBC 4.04  3.87 - 5.11 MIL/uL   Hemoglobin 11.2 (*) 12.0 - 15.0 g/dL   HCT 35.6 (*) 36.0 - 46.0 %   MCV 88.1  78.0 - 100.0 fL   MCH 27.7  26.0 - 34.0 pg   MCHC 31.5  30.0 - 36.0 g/dL   RDW 16.2 (*) 11.5 - 15.5 %   Platelets 369  150 - 400 K/uL  PROTIME-INR     Status: Abnormal   Collection Time    08/25/13  6:01 AM       Result Value Ref Range   Prothrombin Time 27.3 (*) 11.6 - 15.2 seconds   INR 2.53 (*) 0.00 - 1.49     HEENT: normal Cardio: irregular, 80's to 90 Resp: CTA B/L and unlabored GI: BS positive and NT,ND Extremity:  Pulses positive and trace LLE Edema Skin:   Intact and Wound without sutures or staples Neuro: Alert/Oriented and Abnormal Motor 5/5 in BUE, 4/5 RLE, trace Left HF, KE, 4- ADF Musc/Skel:  Extremity tender Left lateral thigh Gen NAD   Assessment/Plan: 1. Functional deficits secondary to Left femoral neck fx which require 3+ hours per day of interdisciplinary therapy in a comprehensive inpatient rehab setting. Physiatrist is providing close team supervision and 24 hour management of active medical problems listed below. Physiatrist and rehab team continue to assess barriers to discharge/monitor patient progress toward functional and medical goals.  Walked quite a bit with PT yesterday. If CV parameters remain stable, might be able to finish up here a little sooner. Follow for now.  FIM: FIM - Bathing Bathing Steps Patient Completed: Chest;Right Arm;Left Arm;Abdomen;Front perineal area;Buttocks;Right upper leg;Left upper leg;Right lower leg (including foot);Left lower leg (including foot) Bathing: 5: Set-up assist to: Obtain items  FIM - Upper Body Dressing/Undressing Upper body dressing/undressing steps patient completed: Thread/unthread right sleeve of pullover shirt/dresss;Thread/unthread left sleeve of pullover shirt/dress;Put head through opening of pull over shirt/dress;Pull shirt over trunk Upper body dressing/undressing: 5: Set-up assist to: Obtain clothing/put away FIM - Lower Body Dressing/Undressing Lower body dressing/undressing steps patient completed: Thread/unthread right underwear leg;Thread/unthread left underwear leg;Thread/unthread right pants leg;Thread/unthread left pants leg;Pull pants up/down;Don/Doff left shoe;Don/Doff right shoe Lower body  dressing/undressing: 4: Min-Patient completed 75 plus % of tasks  FIM - Toileting Toileting steps completed by patient: Adjust clothing prior to toileting;Performs perineal hygiene;Adjust clothing after toileting Toileting: 5: Supervision: Safety issues/verbal cues  FIM - Air cabin crew Transfers Assistive Devices: Bedside commode;Walker Toilet Transfers: 5-To toilet/BSC: Supervision (verbal cues/safety issues);5-From toilet/BSC: Supervision (verbal cues/safety issues)  FIM - Control and instrumentation engineer Devices: Walker;Arm rests Bed/Chair Transfer: 5: Chair or W/C > Bed: Supervision (verbal cues/safety issues);3: Sit > Supine: Mod A (lifting assist/Pt. 50-74%/lift 2 legs);4: Supine > Sit: Min A (steadying Pt. > 75%/lift 1 leg);5: Bed > Chair or W/C: Supervision (verbal cues/safety issues)  FIM - Locomotion: Wheelchair Distance: 120 Locomotion: Wheelchair: 2: Travels 50 - 149 ft with supervision, cueing or coaxing FIM - Locomotion: Ambulation Locomotion: Ambulation Assistive Devices: Administrator Ambulation/Gait Assistance: 5: Supervision Locomotion: Ambulation: 2: Travels 50 - 149 ft with supervision/safety issues  Comprehension Comprehension Mode: Auditory Comprehension: 5-Understands complex 90% of the time/Cues < 10% of the time  Expression Expression Mode: Verbal Expression: 5-Expresses complex 90% of the time/cues < 10% of the time  Social Interaction Social Interaction: 5-Interacts appropriately 90% of the time - Needs monitoring or encouragement for participation or interaction.  Problem Solving Problem Solving: 5-Solves basic 90% of the time/requires cueing < 10% of the time  Memory Memory: 4-Recognizes or recalls 75 - 89% of the time/requires cueing 10 - 24% of the time  Medical Problem List and Plan:  1. Functional deficits secondary to displaced left femoral neck fracture.  2. DVT Prophylaxis/Anticoagulation: Pharmaceutical:  Coumadin  3. Pain Management: Will add voltaren gel for right knee. K pad for lumbago. Continue oxycodone prn  4. Mood: Team to provide ego support. LCSW to follow for evaluation and support.  5. Neuropsych: This patient is capable of making decisions on her own behalf.  6. Skin/Wound Care: Routine pressure relief measures. Keep heels elevated .  7. Urinary retention: still with frequency and retention  -ucx neg, re-check pending   -off abx  -continue to check PVR's 8. Constipation:   Increased miralax to bid. Beginning to move bowels better  9. SSS with PPM/ A fib:    - coumadin.   -norvasc stopped again by cards  -appreciate cards assist  -metoprolol increased to 50mg  bid  -continue to push fluids.   -abd binder/THT's 10 Post op fever: IS. Resolved    11. Hypokalemia: supplemented  .    LOS (Days) 7 A FACE TO FACE EVALUATION WAS PERFORMED  SWARTZ,ZACHARY T 08/26/2013, 7:47 AM

## 2013-08-26 NOTE — Progress Notes (Signed)
Occupational Therapy Session Note  Patient Details  Name: Anna Farrell MRN: [id number] Date of Birth: 07/03/1927  Today's Date: 08/26/2013 OT Individual Time: 1000-1100 OT Individual Time Calculation (min): 60 min   Short Term Goals: Week 1:  OT Short Term Goal 1 (Week 1): Pt will perform LB bathing with Mod A in order to increase I with self care.  OT Short Term Goal 2 (Week 1): Pt will perform toileting with Min A in order to increase I in self care.  OT Short Term Goal 3 (Week 1): Pt will perform toilet transfer with min A in order to increase I in functional transfers.  OT Short Term Goal 4 (Week 1): Pt will perform shower transfer with Min A in order to increase I with functional transfers. OT Short Term Goal 5 (Week 1): Pt will perform functional dynamic balance task for 5 minutes with Min A for balance in order to increase I with balance.  Skilled Therapeutic Interventions/Progress Updates:    Pt resting in recliner upon arrival and agreeable to participating in BADLs this morning.  Pt requested to use toilet prior to bathing at sink. BP 133/73 seated and 98/69 standing with no Ted hose or abdominal binder.  Pt amb with RW to bathroom before returning to sink for bathing and dressing. Pt completed all BADLs including bathing/dressing and toileting tasks requiring steady A only for LB dressing tasks.  Pt recalls hip precautions and adheres to them throughout session.  Pt uses AE appropriately for LB bathing and dressing tasks.  Focus on activity tolerance, safety awareness, functional amb with RW, dynamic standing balance, and energy conservation.  Therapy Documentation Precautions:  Precautions Precautions: Posterior Hip;Fall Precaution Booklet Issued: Yes (comment) Precaution Comments: pt verbalized 3/3 hip precautions.   Restrictions Weight Bearing Restrictions: Yes LLE Weight Bearing: Weight bearing as tolerated General:   Vital Signs: Therapy Vitals Pulse Rate: 82 BP:  101/63 mmHg Patient Position (if appropriate): Sitting Pain: Pain Assessment Pain Score: 0-No pain  See FIM for current functional status  Therapy/Group: Individual Therapy  Leroy Libman 08/26/2013, 12:07 PM

## 2013-08-26 NOTE — Progress Notes (Signed)
Recreational Therapy Session Note  Patient Details  Name: Anna Farrell MRN: [id number] Date of Birth: 1927/10/27 Today's Date: 08/26/2013 LATE ENTRY from 08/25/2013  Pt participated in animal assisted activity/therapy at bedside with supervision.  [place] 08/26/2013, 8:07 AM

## 2013-08-26 NOTE — Progress Notes (Signed)
Pt. Refuses CPAP at this time. Pt. Was made aware to let RT or RN know anytime during the night if she changes her mind & decides to wear CPAP.

## 2013-08-26 NOTE — Progress Notes (Signed)
Physical Therapy Session Note  Patient Details  Name: Anna Farrell MRN: [id number] Date of Birth: 07-02-1927  Today's Date: 08/26/2013 PT Individual Time: 1400-1500 PT Individual Time Calculation (min): 60 min   Short Term Goals: Week 1:  PT Short Term Goal 1 (Week 1): Pt will demonstrate bed mobility req min A PT Short Term Goal 2 (Week 1): Pt will demonstrate sit to stand req min A with RW PT Short Term Goal 3 (Week 1): Pt will ambulate 25' with RW req SBA PT Short Term Goal 4 (Week 1): Pt will ascend/descend 2 stairs with B rails req CGA PT Short Term Goal 5 (Week 1): Pt will demonstrate w/c propulsion x 100' req SBA with B UEs.   Skilled Therapeutic Interventions/Progress Updates:    Therapeutic Exercise: PT instructs pt in L LE A/AROM exercises and strengthening as needed: heel slides, ankle pumps, bridges, supine hip abduction/adduction, hip ER/IR to neutral x 20 reps each.   Therapeutic Activity: PT instructs pt in supine to sit transfer req SBA and verbal cues for technique - pt begins to roll onto side and PT instructs pt that she needs a pillow between her knees if she plans on rolling on to her side. PT instructs pt in sit to stand from edge of bed with RW req min A and in toilet transfer with RW to [facility] over toilet req SBA for safety. Pt has a bladder accident and PT assists her in changing pants and brief req mod A. Pt req min A to don shoes. PT instructs pt in donning abdominal brace in sitting req cues only. PT instructs pt in sit to supine transfer with leg lifter for L LE req min A.   Gait Training: PT instructs pt in ambulation with RW x 130' req SBA for safety and verbal cues to walk as fast, but as safe as possible, in order to increase gait speed. PT instructs pt in ascending/descending 3 stairs with B rails req SBA: step-to pattern both directions.   Pt is progressing well with PT, but continues to require work on bed mobility and car transfers most significantly. Pt  will benefit from continued L LE ROM and strengthening rehabilitation, as well as improving gait endurance. Pt is variable in her sit to stand ability - demonstrates poor forward weight shift and thus is unsafe when transitioning hands from w/c to RW.    Therapy Documentation Precautions:  Precautions Precautions: Posterior Hip;Fall Precaution Booklet Issued: Yes (comment) Precaution Comments: pt verbalized 3/3 hip precautions.   Restrictions Weight Bearing Restrictions: Yes LLE Weight Bearing: Weight bearing as tolerated       Pain: Pain Assessment Pain Assessment: 0-10 Pain Score: 2  Pain Type: Surgical pain Pain Location: Hip Pain Orientation: Left Pain Onset: On-going Pain Intervention(s): Rest;Repositioned Multiple Pain Sites: Yes 2nd Pain Site Pain Score: 2 Pain Type: Chronic pain Pain Location: Neck Pain Orientation: Posterior Pain Frequency: Occasional Pain Onset: Gradual Pain Intervention(s): Repositioned  See FIM for current functional status  Therapy/Group: Individual Therapy  Deysi Soldo M 08/26/2013, 2:04 PM

## 2013-08-26 NOTE — Progress Notes (Signed)
Occupational Therapy Session Note  Patient Details  Name: Anna Farrell MRN: [id number] Date of Birth: February 27, 1927  Today's Date: 08/26/2013 OT Individual Time: 1105-1205 OT Individual Time Calculation (min): 60 min   Short Term Goals: Week 1:  OT Short Term Goal 1 (Week 1): Pt will perform LB bathing with Mod A in order to increase I with self care.  OT Short Term Goal 2 (Week 1): Pt will perform toileting with Min A in order to increase I in self care.  OT Short Term Goal 3 (Week 1): Pt will perform toilet transfer with min A in order to increase I in functional transfers.  OT Short Term Goal 4 (Week 1): Pt will perform shower transfer with Min A in order to increase I with functional transfers. OT Short Term Goal 5 (Week 1): Pt will perform functional dynamic balance task for 5 minutes with Min A for balance in order to increase I with balance.  Skilled Therapeutic Interventions/Progress Updates:  Patient resting in w/c up arrival.  Engaged in functional mobility and adhering to THP.  Focused session on sit><stands from 4 different type and height surfaces, transitional movements as they relate to THP and use of leg lifter to assist with bed mobility.  Patient reports that during the night she has a hard time getting comfortable and feels like she calls the nursing staff a lot to assist with this task.  She is hopeful that the leg lifter will improve her independence with this task.  Therapy Documentation Precautions:  Precautions Precautions: Posterior Hip;Fall Precaution Booklet Issued: Yes (comment) Precaution Comments: pt verbalized 3/3 hip precautions.   Restrictions Weight Bearing Restrictions: Yes LLE Weight Bearing: Weight bearing as tolerated Pain: 8/10 pain in LLE, rest, repositioned, and activity.  RN notified and medication provided Therapy/Group: Individual Therapy  Kashawn Manzano 08/26/2013, 5:25 PM

## 2013-08-27 ENCOUNTER — Inpatient Hospital Stay (HOSPITAL_COMMUNITY): Payer: Medicare Other | Admitting: *Deleted

## 2013-08-27 ENCOUNTER — Inpatient Hospital Stay (HOSPITAL_COMMUNITY): Payer: Medicare Other | Admitting: Physical Therapy

## 2013-08-27 ENCOUNTER — Inpatient Hospital Stay (HOSPITAL_COMMUNITY): Payer: Medicare Other | Admitting: Occupational Therapy

## 2013-08-27 LAB — PROTIME-INR
INR: 2.43 — ABNORMAL HIGH (ref 0.00–1.49)
Prothrombin Time: 26.4 seconds — ABNORMAL HIGH (ref 11.6–15.2)

## 2013-08-27 NOTE — Progress Notes (Signed)
INITIAL NUTRITION ASSESSMENT  Pt meets criteria for NON-SEVERE (MODERATE) MALNUTRITION in the context of chronic illness as evidenced by energy intake < 75% for >/= 1 month and moderate muscle mass depletion.  DOCUMENTATION CODES Per approved criteria  -Non-severe (moderate) malnutrition in the context of chronic illness   INTERVENTION: Continue Ensure Complete po BID, each supplement provides 350 kcal and 13 grams of protein.  Encourage PO intake.  NUTRITION DIAGNOSIS: Inadequate oral intake related to decreased appetite as evidenced by varied meal completion of mostly 50-70% .   Goal: Pt to meet >/= 90% of their estimated nutrition needs   Monitor:  PO intake, weight trends, labs, I/O's  Reason for Assessment: MST  80 y.o. female  Admitting Dx: Hip fracture requiring operative repair  ASSESSMENT: 80 y.o. female with history of SSS, A fib with PPM, orthostatic hypotension, Right knee OA, OSA (new to CPAP); who slipped and fell while in [place] on on 08/15/13 with subsequent displaced left femoral neck fracture.  Spoke with pt and family during time of visit. Pt reports having a decreased appetite that has been going on over the past 4 months. Pt reports she has been eating less of food at home. Pt reports she used to eat 3 full meals a day, but prior to admission she has gone down to only 2 meals a day. Pt does report that she has had stomach pains earlier during her admission, but reports now that it has improved, however her appetite is still the same.Pt reports she has no weight loss with her usual body weight of 138 lbs.    Pt reports she has been drinking Ensure and would like to continue on the order. Pt and family were educated that continuing to drink Ensure at home would be good to increase the pt's calorie and protein intake, since she has been having a decreased appetite.  Nutrition Focused Physical Exam:  Subcutaneous Fat:  Orbital Region: N/A Upper Arm  Region: WNL Thoracic and Lumbar Region: WNL  Muscle:  Temple Region: Moderate depletion Clavicle Bone Region: Moderate depletion Clavicle and Acromion Bone Region: Moderate depletion Scapular Bone Region: N/A Dorsal Hand: Moderate depletion Patellar Region: WNL Anterior Thigh Region: WNL Posterior Calf Region: WNL  Edema: non-pitting LLE   Height: Ht Readings from Last 1 Encounters:  08/19/13 5\' 2"  (1.575 m)    Weight: Wt Readings from Last 1 Encounters:  08/19/13 146 lb 6.2 oz (66.4 kg)    Ideal Body Weight: 110 lbs  % Ideal Body Weight: 133%  Wt Readings from Last 10 Encounters:  08/19/13 146 lb 6.2 oz (66.4 kg)  08/15/13 134 lb 1.6 oz (60.827 kg)  08/15/13 134 lb 1.6 oz (60.827 kg)  04/28/13 136 lb (61.689 kg)  04/27/13 132 lb (59.875 kg)  04/16/13 136 lb (61.689 kg)  04/08/13 144 lb 13.5 oz (65.7 kg)  04/08/13 144 lb 13.5 oz (65.7 kg)  02/26/13 140 lb (63.504 kg)  12/17/12 137 lb (62.143 kg)    Usual Body Weight: 138 lbs  % Usual Body Weight: 106%  BMI:  Body mass index is 26.77 kg/(m^2).  Estimated Nutritional Needs: Kcal: 1650-1850 Protein: 70-80 grams Fluid: 1.65 - 1.85 L/day  Skin: Left hip incision, non-pitting LLE edema  Diet Order: General  EDUCATION NEEDS: -Education needs addressed   Intake/Output Summary (Last 24 hours) at 08/27/13 0932 Last data filed at 08/27/13 0900  Gross per 24 hour  Intake   1080 ml  Output  0 ml  Net   1080 ml    Last BM: 8/20   Labs:   Recent Labs Lab 08/23/13 0707  NA 143  K 4.8  CL 103  CO2 30  BUN 7  CREATININE 0.58  CALCIUM 9.6  GLUCOSE 105*    CBG (last 3)  No results found for this basename: GLUCAP,  in the last 72 hours  Scheduled Meds: . cholecalciferol  2,000 Units Oral Daily  . feeding supplement (ENSURE COMPLETE)  237 mL Oral BID BM  . fluticasone  2 puff Inhalation BID  . iron polysaccharides  150 mg Oral Q breakfast  . metoprolol succinate  50 mg Oral BID  .  polyethylene glycol  17 g Oral BID  . simvastatin  20 mg Oral q1800  . traMADol  50 mg Oral 4 times per day  . vitamin B-12  100 mcg Oral Weekly  . warfarin  3 mg Oral Once per day on Sun Mon Tue Wed Fri Sat  . warfarin  4.5 mg Oral Once per day on Thu  . Warfarin - Pharmacist Dosing Inpatient   Does not apply q1800    Continuous Infusions:   Past Medical History  Diagnosis Date  . Vasovagal syncope   . Hypertension   . Hyperlipidemia   . Orthostatic hypotension   . Osteoporosis   . Pacemaker 10/11/06    implanted by Dr Leonia Reeves for pauses > 4 seconds with afib  . Iron deficiency anemia   . Colitis 5/13  . Chronic low back pain 2008    crushed vertebrae  . Tachycardia-bradycardia syndrome   . Permanent atrial fibrillation   . Asymptomatic PVCs   . History of colonic polyps   . Extrinsic asthma, unspecified     LOV Dr Joya Gaskins 7/12  . Long term (current) use of anticoagulants   . Cholelithiasis   . Generalized osteoarthrosis, unspecified site   . Pancreatitis     Past Surgical History  Procedure Laterality Date  . Vesicovaginal fistula closure w/ tah  1981  . Pacemaker insertion  10/11/2006    SJM Zephyr XL DR implanted by Dr Leonia Reeves for afib with pause > 4 seconds  . Colonoscopy    . Appendectomy    . Abdominal hysterectomy    . Cholecystectomy  09/17/2011    Procedure: LAPAROSCOPIC CHOLECYSTECTOMY;  Surgeon: Rolm Bookbinder, MD;  Location: [facility];  Service: General;;  . Cardiac catheterization  2009    Normal coronary arteries  . Ercp N/A 04/07/2013    Procedure: ENDOSCOPIC RETROGRADE CHOLANGIOPANCREATOGRAPHY (ERCP);  Surgeon: Jeryl Columbia, MD;  Location: [facility];  Service: Endoscopy;  Laterality: N/A;  . Eus  04/07/2013    Procedure: UPPER ENDOSCOPIC ULTRASOUND (EUS) RADIAL;  Surgeon: Jeryl Columbia, MD;  Location: [facility];  Service: Endoscopy;;  . Hip arthroplasty Left 08/17/2013    Procedure: ARTHROPLASTY BIPOLAR HIP;  Surgeon: Renette Butters, MD;  Location:  [facility];  Service: Orthopedics;  Laterality: Left;    Kallie Locks, MS, Provisional LDN Pager # [phone] After hours/ weekend pager # [phone]

## 2013-08-27 NOTE — Progress Notes (Signed)
Occupational Therapy Weekly Progress Note  Patient Details  Name: Anna Farrell MRN: [id number] Date of Birth: 04-28-1927  Beginning of progress report period: August 21, 2013 End of progress report period: August 27, 2013  Today's Date: 08/27/2013 OT Individual Time:  4098-1191  Skilled Time: 60 min   Patient has met 4 of 5 short term goals. Progressing towards meeting short term goal of standing tolerance with functional activities.   Patient continues to demonstrate the following deficits: decreased I in self care, decreased I in functional transfers, decreased I in functional mobility, B UE strength, increased fatigue and therefore will continue to benefit from skilled OT intervention to enhance overall performance with BADL.  Patient progressing toward long term goals..  Continue plan of care.  OT Short Term Goals Week 2:  OT Short Term Goal 1 (Week 2): STG= LTGs secondary to remainder of LOS  Skilled Therapeutic Interventions/Progress Updates:  Upon entering the room, pt seated in recliner chair with 4 family members present. Pt with 3/10 pain in L hip and 5/10 pain in lower back. Pt rest and repositioning utilized to decrease pain. OT session with focus on patient and family education, ADL training, dynamic standing balance, functional transfers, and energy conservation. Pt ambulated into bathroom with use of RW and supervision. Pt preformed tub transfer with Min A into tub/shower combination. OT demonstrated transfer to pt and caregiver prior to pt performance. OT also discussed tub transfer bench equipment which family states that pt currently owns. OT also discussing areas of concern within the bathroom for fall risk as well as recommendation for placement on nonslip bath mat outside of tub at bench. Pt and family members verbalized understanding. Pt performed bathing with set up A and long handled sponge in order to not break hip precautions. Pt seated on edge of tub bench for  dressing with verbal cues to use long handled reacher with LB dressing. Grooming performed at sink side in room. Caregivers reporting no further questions at this time. They are requesting [facility] OT evaluation after discharge to assess pt safety with ADLs and equipment set up within the home.   Therapy Documentation Precautions:  Precautions Precautions: Posterior Hip;Fall Precaution Booklet Issued: Yes (comment) Precaution Comments: pt verbalized 3/3 hip precautions.   Restrictions Weight Bearing Restrictions: Yes LLE Weight Bearing: Weight bearing as tolerated Pain: Pain Assessment Pain Assessment: 0-10 Pain Score: 3  Pain Type: Surgical pain Pain Location: Hip Pain Orientation: Left Pain Descriptors / Indicators: Aching Pain Frequency: Intermittent Pain Onset: On-going Patients Stated Pain Goal: 2 Pain Intervention(s): Rest;Repositioned Multiple Pain Sites: Yes 2nd Pain Site Pain Score: 5 Pain Type: Chronic pain Pain Location: Back Pain Orientation: Mid;Lower Pain Frequency: Constant Pain Onset: On-going Pain Intervention(s): Rest;Repositioned  Therapy/Group: Individual Therapy  Phineas Semen 08/27/2013, 12:39 PM

## 2013-08-27 NOTE — Progress Notes (Signed)
Physical Therapy Session Note  Patient Details  Name: Anna Farrell MRN: [id number] Date of Birth: Oct 30, 1927  Today's Date: 08/27/2013 PT Individual Time: 1400-1440 PT Individual Time Calculation (min): 40 min   Short Term Goals: Week 2:  PT Short Term Goal 1 (Week 2): STG=LTGs  Skilled Therapeutic Interventions/Progress Updates:    Patient received from nurse tech, sitting EOB. Session focused on functional transfers and ambulation and bed mobility. Functional ambulation to/from bathroom with RW and close supervision, patient incontinent of bladder, requiring change of clothes. Patient requires supervision for several sit<>stands to doff saturated clothing/don new clothes. In bed, block practice sit<>supine, alternating use or nonuse of leg lifter; supervision overall with and without use of leg lifter with increased time. Patient with c/o significant fatigue, asking if she can remain in bed. Encouraged to perform supine therex, however, patient stating she just doesn't "feel so hot." RN notified and patient left supine in bed with all needs within reach.  Therapy Documentation Precautions:  Precautions Precautions: Posterior Hip;Fall Precaution Booklet Issued: Yes (comment) Precaution Comments: pt verbalized 3/3 hip precautions.   Restrictions Weight Bearing Restrictions: Yes LLE Weight Bearing: Weight bearing as tolerated General: PT Amount of Missed Time (min): 20 Minutes PT Missed Treatment Reason: Patient fatigue Pain: Pain Assessment Pain Assessment: No/denies pain Pain Score: 0-No pain Locomotion : Ambulation Ambulation/Gait Assistance: 5: Supervision   See FIM for current functional status  Therapy/Group: Individual Therapy  Lillia Abed. Haile Bosler, PT, DPT 08/27/2013, 2:50 PM

## 2013-08-27 NOTE — Progress Notes (Signed)
Note and chart reviewed.  Pryor Ochoa RD, LDN Pager: [phone] After Hours Pager: [phone]

## 2013-08-27 NOTE — Progress Notes (Signed)
Physical Therapy Weekly Progress Note  Patient Details  Name: Anna Farrell MRN: [id number] Date of Birth: [birth year]  Beginning of progress report period: August 20, 2013 End of progress report period: August 27, 2013  Today's Date: 08/27/2013 PT Individual Time: 1015-1120 PT Individual Time Calculation (min): 65 min   Patient has met 5 of 5 short term goals.    Patient continues to demonstrate the following deficits: limited activity tolerance, difficulty with sit to stand, bed mobility, and car transfers, weakness in L LE, and chronic pain and therefore will continue to benefit from skilled PT intervention to enhance overall performance with activity tolerance, balance, postural control, functional use of  left lower extremity and coordination.  Patient progressing toward long term goals..  Continue plan of care.  PT Short Term Goals Week 1:  PT Short Term Goal 1 (Week 1): Pt will demonstrate bed mobility req min A PT Short Term Goal 1 - Progress (Week 1): Met PT Short Term Goal 2 (Week 1): Pt will demonstrate sit to stand req min A with RW PT Short Term Goal 2 - Progress (Week 1): Met PT Short Term Goal 3 (Week 1): Pt will ambulate 25' with RW req SBA PT Short Term Goal 3 - Progress (Week 1): Met PT Short Term Goal 4 (Week 1): Pt will ascend/descend 2 stairs with B rails req CGA PT Short Term Goal 4 - Progress (Week 1): Met PT Short Term Goal 5 (Week 1): Pt will demonstrate w/c propulsion x 100' req SBA with B UEs.  PT Short Term Goal 5 - Progress (Week 1): Met Week 2:  PT Short Term Goal 1 (Week 2): STG=LTGs  Skilled Therapeutic Interventions/Progress Updates:    Gait Training: PT instructs pt in ambulation with RW x 95' req SBA and verbal cues for large R step length. PT instructs pt in ascending/descending 3 stairs with B rails req SBA for safety - step to pattern.   Therapeutic Activity: PT instructs pt and family in car transfer req SBA with RW and verbal cues for  technique. PT explains to family that they can obtain a car handle and a swivel seat if they choose to make this transfer easier. Pt has a chevy malibu with bucket seats that is low to the ground. PT instructs family to obtain some folded blankets for the seat to level it out and raise the seat height. PT also explains that the passenger seat should be in the back position and backrest fully reclined so that pt can scoot bottom slightly onto the backrest in order for improved leg clearance.  PT instructs pt in sit to supine transfer with leg lifter req min A and increased time for L LE - pt very close to getting LE onto bed without PT assist. PT spoke with family at length about adaptive equipment that can decrease pt's falls risk including a female/no-spill urinal, a tray for walker, a bag for reacher for walker, bedrails for bed, and possible a fully electric bed for home use.   Pt will benefit from continued PT to maximize functional independence, with focus on getting into bed and car transfers, as well as continued L LE ROM and strengthening rehab.   Therapy Documentation Precautions:  Precautions Precautions: Posterior Hip;Fall Precaution Booklet Issued: Yes (comment) Precaution Comments: pt verbalized 3/3 hip precautions.   Restrictions Weight Bearing Restrictions: Yes LLE Weight Bearing: Weight bearing as tolerated  Pain: Pain Assessment Pain Assessment: 0-10 Pain Score: 5  Pain Type:  Surgical pain Pain Location: Hip Pain Orientation: Left Pain Onset: On-going Pain Intervention(s): Rest;Repositioned Multiple Pain Sites: Yes 2nd Pain Site Pain Score: 5 Pain Type: Chronic pain Pain Location: Back Pain Orientation: Mid;Lower Pain Frequency: Constant Pain Onset: On-going Pain Intervention(s): Rest;Repositioned  See FIM for current functional status  Therapy/Group: Individual Therapy  Christinamarie Tall M 08/27/2013, 10:23 AM

## 2013-08-27 NOTE — Progress Notes (Signed)
Social Work Patient ID: Anna Farrell, female   DOB: 12-21-1927, 80 y.o.   MRN: [id number]  Met with pt and children today following famiy ed sessions with therapists.  All pleased with progress pt has made and aware we are targeting d/c on Tues 8/25.  Son and daughter do have questions about whether [facility] SNF would be an option for her "if we just don't feel like she's ready on Tuesday."  Explained that team feel pt could reach a mod i w/c level with supervision for stairs and short distance ambulation which means pt could be safe to be alone at home for several hours at a time.  If these goals are reached, then SNF level of care may not be warranted.  They all understand and are agreed that we will see how pt performs over the week and I will follow up with pt and daughter on Monday.  Laranda Burkemper, LCSW

## 2013-08-27 NOTE — Progress Notes (Signed)
Subjective/Complaints: 80 y.o. female with history of SSS, A fib with PPM, orthostatic hypotension, Right knee OA, OSA (new to CPAP); who slipped and fell while in [place] on on 08/15/13 with subsequent displaced left femoral neck fracture. She was transferred to [facility] per family request and was evaluated by Dr. Alain Marion. Chronic coumadin reversed and patient underwent left hip bipolar hemi on 08/17/13. Post op is WBAT and coumadin resumed  Left hip feeling stronger. Tolerated therapy fairly well yesterday.   Review of Systems - Negative except Left hip pain and urinary retention  Objective: Vital Signs: Blood pressure 114/69, pulse 86, temperature 98.7 F (37.1 C), temperature source Oral, resp. rate 19, height 5\' 2"  (1.575 m), weight 66.4 kg (146 lb 6.2 oz), SpO2 97.00%. No results found. Results for orders placed during the hospital encounter of 08/19/13 (from the past 72 hour(s))  PROTIME-INR     Status: Abnormal   Collection Time    08/25/13  6:01 AM      Result Value Ref Range   Prothrombin Time 27.3 (*) 11.6 - 15.2 seconds   INR 2.53 (*) 0.00 - 1.49  PROTIME-INR     Status: Abnormal   Collection Time    08/27/13  6:26 AM      Result Value Ref Range   Prothrombin Time 26.4 (*) 11.6 - 15.2 seconds   INR 2.43 (*) 0.00 - 1.49     HEENT: normal Cardio: irregular,   90's Resp: CTA B/L and unlabored GI: BS positive and NT,ND Extremity:  Pulses positive and trace LLE Edema Skin:   Intact and Wound without sutures or staples Neuro: Alert/Oriented and Abnormal Motor 5/5 in BUE, 4/5 RLE, trace Left HF, KE, 4- ADF Musc/Skel:    Left lateral thigh less tender Gen NAD   Assessment/Plan: 1. Functional deficits secondary to Left femoral neck fx which require 3+ hours per day of interdisciplinary therapy in a comprehensive inpatient rehab setting. Physiatrist is providing close team supervision and 24 hour management of active medical problems listed below. Physiatrist and rehab  team continue to assess barriers to discharge/monitor patient progress toward functional and medical goals.      FIM: FIM - Bathing Bathing Steps Patient Completed: Chest;Right Arm;Left Arm;Abdomen;Front perineal area;Buttocks;Right upper leg;Left upper leg;Right lower leg (including foot);Left lower leg (including foot) Bathing: 5: Set-up assist to: Obtain items  FIM - Upper Body Dressing/Undressing Upper body dressing/undressing steps patient completed: Thread/unthread right sleeve of pullover shirt/dresss;Thread/unthread left sleeve of pullover shirt/dress;Put head through opening of pull over shirt/dress;Pull shirt over trunk Upper body dressing/undressing: 5: Set-up assist to: Obtain clothing/put away FIM - Lower Body Dressing/Undressing Lower body dressing/undressing steps patient completed: Thread/unthread right underwear leg;Thread/unthread left underwear leg;Thread/unthread right pants leg;Thread/unthread left pants leg;Pull pants up/down;Don/Doff left shoe;Don/Doff right shoe;Don/Doff right sock;Don/Doff left sock;Fasten/unfasten right shoe;Fasten/unfasten left shoe Lower body dressing/undressing: 4: Steadying Assist  FIM - Toileting Toileting steps completed by patient: Adjust clothing prior to toileting;Adjust clothing after toileting;Performs perineal hygiene Toileting: 5: Supervision: Safety issues/verbal cues  FIM - Air cabin crew Transfers Assistive Devices: Bedside commode;Walker Toilet Transfers: 5-To toilet/BSC: Supervision (verbal cues/safety issues);5-From toilet/BSC: Supervision (verbal cues/safety issues)  FIM - Control and instrumentation engineer Devices: Orthosis;Arm rests;Walker Bed/Chair Transfer: 5: Supine > Sit: Supervision (verbal cues/safety issues);4: Sit > Supine: Min A (steadying pt. > 75%/lift 1 leg);4: Bed > Chair or W/C: Min A (steadying Pt. > 75%);4: Chair or W/C > Bed: Min A (steadying Pt. > 75%)  FIM - Locomotion:  Wheelchair Distance: 120 Locomotion: Wheelchair: 0: Activity did not occur FIM - Locomotion: Ambulation Locomotion: Ambulation Assistive Devices: Administrator Ambulation/Gait Assistance: 5: Supervision Locomotion: Ambulation: 2: Travels 3 - 149 ft with supervision/safety issues  Comprehension Comprehension Mode: Auditory Comprehension: 6-Follows complex conversation/direction: With extra time/assistive device  Expression Expression Mode: Verbal Expression: 6-Expresses complex ideas: With extra time/assistive device  Social Interaction Social Interaction: 6-Interacts appropriately with others with medication or extra time (anti-anxiety, antidepressant).  Problem Solving Problem Solving: 5-Solves basic problems: With no assist  Memory Memory: 5-Recognizes or recalls 90% of the time/requires cueing < 10% of the time  Medical Problem List and Plan:  1. Functional deficits secondary to displaced left femoral neck fracture.  2. DVT Prophylaxis/Anticoagulation: Pharmaceutical: Coumadin  3. Pain Management: Will add voltaren gel for right knee. K pad for lumbago. Continue oxycodone prn  4. Mood: Team to provide ego support. LCSW to follow for evaluation and support.  5. Neuropsych: This patient is capable of making decisions on her own behalf.  6. Skin/Wound Care: Routine pressure relief measures. Keep heels elevated .  7. Urinary retention:    -ucx neg, re-check with 15k staph---no treat   -off abx  -encourage double voids 8. Constipation:   Increased miralax to bid. Beginning to move bowels better  9. SSS with PPM/ A fib: pt has been tolerating activity better over the last 2 days   - coumadin.   -norvasc stopped again by cards  -appreciate cards assist  -metoprolol increased to 50mg  bid  -continue to push fluids.   -abd binder/THT's 10 Post op fever: IS. Resolved    11. Hypokalemia: supplemented  .    LOS (Days) 8 A FACE TO FACE EVALUATION WAS  PERFORMED  Eran Mistry T 08/27/2013, 8:01 AM

## 2013-08-27 NOTE — Progress Notes (Signed)
ANTICOAGULATION CONSULT NOTE - Follow Up Consult  Pharmacy Consult for coumadin Indication: atrial fibrillation  Allergies  Allergen Reactions  . Betadine [Povidone Iodine]     burning  . Codeine Nausea And Vomiting    Passed out    Patient Measurements: Height: 5\' 2"  (157.5 cm) Weight: 146 lb 6.2 oz (66.4 kg) IBW/kg (Calculated) : 50.1 Heparin Dosing Weight:   Vital Signs: Temp: 98.7 F (37.1 C) (08/21 0618) Temp src: Oral (08/21 0618) BP: 114/69 mmHg (08/20 2028) Pulse Rate: 86 (08/20 2028)  Labs:  Recent Labs  08/25/13 0601 08/27/13 0626  LABPROT 27.3* 26.4*  INR 2.53* 2.43*    Estimated Creatinine Clearance: 47.6 ml/min (by C-G formula based on Cr of 0.58).   Medications:  Scheduled:  . cholecalciferol  2,000 Units Oral Daily  . feeding supplement (ENSURE COMPLETE)  237 mL Oral BID BM  . fluticasone  2 puff Inhalation BID  . iron polysaccharides  150 mg Oral Q breakfast  . metoprolol succinate  50 mg Oral BID  . polyethylene glycol  17 g Oral BID  . simvastatin  20 mg Oral q1800  . traMADol  50 mg Oral 4 times per day  . vitamin B-12  100 mcg Oral Weekly  . warfarin  3 mg Oral Once per day on Sun Mon Tue Wed Fri Sat  . warfarin  4.5 mg Oral Once per day on Thu  . Warfarin - Pharmacist Dosing Inpatient   Does not apply q1800   Infusions:    Assessment: 80 yo female with hx of afib is currently on therapeutic coumadin.  INR today is 2.43. Goal of Therapy:  INR 2-3 Monitor platelets by anticoagulation protocol: Yes   Plan:  -cont home dosing of 3mg  daily except 4.5mg  on Thursdays -MWF INR   Dhanya Bogle, [name] 08/27/2013,8:03 AM

## 2013-08-28 ENCOUNTER — Inpatient Hospital Stay (HOSPITAL_COMMUNITY): Payer: Medicare Other | Admitting: *Deleted

## 2013-08-28 DIAGNOSIS — Z5189 Encounter for other specified aftercare: Secondary | ICD-10-CM

## 2013-08-28 DIAGNOSIS — I951 Orthostatic hypotension: Secondary | ICD-10-CM

## 2013-08-28 DIAGNOSIS — S72009A Fracture of unspecified part of neck of unspecified femur, initial encounter for closed fracture: Secondary | ICD-10-CM

## 2013-08-28 DIAGNOSIS — I4891 Unspecified atrial fibrillation: Secondary | ICD-10-CM

## 2013-08-28 NOTE — Progress Notes (Signed)
Pt refused CPAP says she doesn't like the mask the hospital has. States she would just wear hers at home. Pt was advised if she changed her mind to inform RT.

## 2013-08-28 NOTE — Progress Notes (Signed)
Occupational Therapy Session Note  Patient Details  Name: Anna Farrell MRN: [id number] Date of Birth: [birth year]  Today's Date: 08/28/2013 OT Individual Time:  - 1615-1700  (45 min)     Short Term Goals: Week 1:  OT Short Term Goal 1 (Week 1): Pt will perform LB bathing with Mod A in order to increase I with self care.  OT Short Term Goal 1 - Progress (Week 1): Met OT Short Term Goal 2 (Week 1): Pt will perform toileting with Min A in order to increase I in self care.  OT Short Term Goal 2 - Progress (Week 1): Met OT Short Term Goal 3 (Week 1): Pt will perform toilet transfer with min A in order to increase I in functional transfers.  OT Short Term Goal 3 - Progress (Week 1): Met OT Short Term Goal 4 (Week 1): Pt will perform shower transfer with Min A in order to increase I with functional transfers. OT Short Term Goal 4 - Progress (Week 1): Met OT Short Term Goal 5 (Week 1): Pt will perform functional dynamic balance task for 5 minutes with Min A for balance in order to increase I with balance. OT Short Term Goal 5 - Progress (Week 1): Partly met  Skilled Therapeutic Interventions/Progress Updates:    Addressed sit to stand , transfer to bed, toileting, using urinal, bed mobility.  Pt. Verbalized 3/3 hip precautions.  Ambulated with RW to bed raised to 27 inches.  Pt got in/out of bed with verbal cues to maintain hip precautions and increased time.  Pt. Used leg lifter and blue dycem to stabilize RLE when getting in bed.  Pt ambulated to bathroom to use female urinal with minimal assist with placement.  Pt. Went back to bed and got in at 27 inch bed height for a 2nd practice with increased time.  Dycem working well to stabilize her Right foot.  Left in bed with all needs in place.    Therapy Documentation Precautions:  Precautions Precautions: Posterior Hip;Fall Precaution Booklet Issued: Yes (comment) Precaution Comments: pt verbalized 3/3 hip precautions.   Restrictions Weight  Bearing Restrictions: No LLE Weight Bearing: Weight bearing as tolerated    Pain: Pain Assessment Pain Assessment: No/denies pain   :      See FIM for current functional status  Therapy/Group: Individual Therapy  Lisa Roca 08/28/2013, 5:11 PM

## 2013-08-28 NOTE — Progress Notes (Signed)
Patient refused to wear the CPAP due to she doesn't like the mask that the hospital supplies. RT advised if she changes her mind that she could call for the CPAP. RT will continue to monitor.

## 2013-08-28 NOTE — Progress Notes (Signed)
Subjective/Complaints: 80 y.o. female with history of SSS, A fib with PPM, orthostatic hypotension, Right knee OA, OSA (new to CPAP); who slipped and fell while in [place] on on 08/15/13 with subsequent displaced left femoral neck fracture. She was transferred to [facility] per family request and was evaluated by Dr. Alain Marion. Chronic coumadin reversed and patient underwent left hip bipolar hemi on 08/17/13. Post op is WBAT and coumadin resumed  No dizziness sitting at EOB  Review of Systems - Negative except Left hip pain and urinary retention  Objective: Vital Signs: Blood pressure 138/80, pulse 76, temperature 98.6 F (37 C), temperature source Oral, resp. rate 18, height 5\' 2"  (1.575 m), weight 66.4 kg (146 lb 6.2 oz), SpO2 98.00%. No results found. Results for orders placed during the hospital encounter of 08/19/13 (from the past 72 hour(s))  PROTIME-INR     Status: Abnormal   Collection Time    08/27/13  6:26 AM      Result Value Ref Range   Prothrombin Time 26.4 (*) 11.6 - 15.2 seconds   INR 2.43 (*) 0.00 - 1.49     HEENT: normal Cardio: irregular,   90's Resp: CTA B/L and unlabored GI: BS positive and NT,ND Extremity:  Pulses positive and trace LLE Edema Skin:   Intact and Wound without sutures or staples Neuro: Alert/Oriented and Abnormal Motor 5/5 in BUE, 4/5 RLE, trace Left HF, KE, 4- ADF Musc/Skel:    Left lateral thigh less tender Gen NAD   Assessment/Plan: 1. Functional deficits secondary to Left femoral neck fx which require 3+ hours per day of interdisciplinary therapy in a comprehensive inpatient rehab setting. Physiatrist is providing close team supervision and 24 hour management of active medical problems listed below. Physiatrist and rehab team continue to assess barriers to discharge/monitor patient progress toward functional and medical goals.      FIM: FIM - Bathing Bathing Steps Patient Completed: Chest;Right Arm;Left Arm;Abdomen;Front perineal  area;Buttocks;Right upper leg;Left upper leg;Right lower leg (including foot);Left lower leg (including foot) Bathing: 5: Set-up assist to: Obtain items  FIM - Upper Body Dressing/Undressing Upper body dressing/undressing steps patient completed: Thread/unthread right sleeve of pullover shirt/dresss;Thread/unthread left sleeve of pullover shirt/dress;Put head through opening of pull over shirt/dress;Pull shirt over trunk Upper body dressing/undressing: 5: Set-up assist to: Obtain clothing/put away FIM - Lower Body Dressing/Undressing Lower body dressing/undressing steps patient completed: Thread/unthread right underwear leg;Thread/unthread left underwear leg;Thread/unthread right pants leg;Thread/unthread left pants leg;Pull pants up/down;Don/Doff left shoe;Don/Doff right shoe;Don/Doff right sock;Don/Doff left sock;Fasten/unfasten right shoe;Fasten/unfasten left shoe Lower body dressing/undressing: 4: Steadying Assist  FIM - Toileting Toileting steps completed by patient: Adjust clothing prior to toileting;Adjust clothing after toileting;Performs perineal hygiene Toileting: 5: Supervision: Safety issues/verbal cues  FIM - Air cabin crew Transfers Assistive Devices: Bedside commode;Walker Toilet Transfers: 5-To toilet/BSC: Supervision (verbal cues/safety issues);5-From toilet/BSC: Supervision (verbal cues/safety issues)  FIM - Engineer, site Assistive Devices: Arm rests;Walker;Orthosis (leg lifter) Bed/Chair Transfer: 5: Supine > Sit: Supervision (verbal cues/safety issues);5: Sit > Supine: Supervision (verbal cues/safety issues);5: Chair or W/C > Bed: Supervision (verbal cues/safety issues);5: Bed > Chair or W/C: Supervision (verbal cues/safety issues)  FIM - Locomotion: Wheelchair Distance: 120 Locomotion: Wheelchair: 0: Activity did not occur FIM - Locomotion: Ambulation Locomotion: Ambulation Assistive Devices: Administrator Ambulation/Gait Assistance:  5: Supervision Locomotion: Ambulation: 1: Travels less than 50 ft with supervision/safety issues  Comprehension Comprehension Mode: Auditory Comprehension: 5-Understands complex 90% of the time/Cues < 10% of the time  Expression Expression Mode:  Verbal Expression: 5-Expresses complex 90% of the time/cues < 10% of the time  Social Interaction Social Interaction: 6-Interacts appropriately with others with medication or extra time (anti-anxiety, antidepressant).  Problem Solving Problem Solving: 5-Solves basic problems: With no assist  Memory Memory: 5-Recognizes or recalls 90% of the time/requires cueing < 10% of the time  Medical Problem List and Plan:  1. Functional deficits secondary to displaced left femoral neck fracture.  2. DVT Prophylaxis/Anticoagulation: Pharmaceutical: Coumadin  3. Pain Management: Will add voltaren gel for right knee. K pad for lumbago. Continue oxycodone prn  4. Mood: Team to provide ego support. LCSW to follow for evaluation and support.  5. Neuropsych: This patient is capable of making decisions on her own behalf.  6. Skin/Wound Care: Routine pressure relief measures. Keep heels elevated .  7. Urinary retention:    -ucx neg, re-check with 15k staph---no treat   -off abx  -encourage double voids 8. Constipation:   Increased miralax to bid. Beginning to move bowels better  9. SSS with PPM/ A fib:    - coumadin.   -norvasc stopped again by cards  -appreciate cards assist  -metoprolol increased to 50mg  bid  -continue to push fluids.   -abd binder/THT's, check orthostatics Q am 10 Post op fever: IS. Resolved    11. Hypokalemia: supplemented  .    LOS (Days) 9 A FACE TO FACE EVALUATION WAS PERFORMED  KIRSTEINS,ANDREW E 08/28/2013, 7:18 AM

## 2013-08-29 ENCOUNTER — Inpatient Hospital Stay (HOSPITAL_COMMUNITY): Payer: Medicare Other | Admitting: Occupational Therapy

## 2013-08-29 NOTE — Progress Notes (Signed)
Occupational Therapy Session Note  Patient Details  Name: Anna Farrell MRN: [id number] Date of Birth: 04-25-1927  Today's Date: 08/29/2013 OT Individual Time:  - 11-1298  60 minutes today   Skilled Therapeutic Interventions/Progress Updates: ADL in w/c at sink and toileting with focus on using AE to stay within L total hip precautions and safety to be aware of changes in orthostatic hyper/hypotension symptoms.   Patient demosntrated safety, adhere and awareness.   Patient overall distant S for all tasks today.   However, she will benefit from a TED hose aide as she may not have help at home to don her TED hose.     Therapy Documentation Precautions:  Precautions Precautions: Posterior Hip;Fall Precaution Booklet Issued: Yes (comment) Precaution Comments: pt verbalized 3/3 hip precautions.   Restrictions Weight Bearing Restrictions: No LLE Weight Bearing: Weight bearing as tolerated  Pain:denied  See FIM for current functional status  Therapy/Group: Individual Therapy  Alfredia Ferguson [facility] 08/29/2013, 3:45 PM

## 2013-08-29 NOTE — Progress Notes (Signed)
Subjective/Complaints: 80 y.o. female with history of SSS, A fib with PPM, orthostatic hypotension, Right knee OA, OSA (new to CPAP); who slipped and fell while in [place] on on 08/15/13 with subsequent displaced left femoral neck fracture. She was transferred to [facility] per family request and was evaluated by Dr. Alain Marion. Chronic coumadin reversed and patient underwent left hip bipolar hemi on 08/17/13. Post op is WBAT and coumadin resumed  Feels weak this am, sit to stand systolic dropped [id number] to 86, no stocking or binder, HR dropped too 89-62  Review of Systems - Negative except Left hip pain and urinary retention  Objective: Vital Signs: Blood pressure 125/91, pulse 65, temperature 97.8 F (36.6 C), temperature source Oral, resp. rate 17, height 5\' 2"  (1.575 m), weight 66.4 kg (146 lb 6.2 oz), SpO2 95.00%. No results found. Results for orders placed during the hospital encounter of 08/19/13 (from the past 72 hour(s))  PROTIME-INR     Status: Abnormal   Collection Time    08/27/13  6:26 AM      Result Value Ref Range   Prothrombin Time 26.4 (*) 11.6 - 15.2 seconds   INR 2.43 (*) 0.00 - 1.49     HEENT: normal Cardio: irregular,   60's Resp: CTA B/L and unlabored GI: BS positive and NT,ND Extremity:  Pulses positive and trace LLE Edema,  Skin:   Intact and Wound without sutures or staples Neuro: Alert/Oriented and Abnormal Motor 5/5 in BUE, 4/5 RLE, trace Left HF, KE, 4- ADF Musc/Skel:    Left lateral thigh mildly tender Gen NAD   Assessment/Plan: 1. Functional deficits secondary to Left femoral neck fx which require 3+ hours per day of interdisciplinary therapy in a comprehensive inpatient rehab setting. Physiatrist is providing close team supervision and 24 hour management of active medical problems listed below. Physiatrist and rehab team continue to assess barriers to discharge/monitor patient progress toward functional and medical goals.      FIM: FIM -  Bathing Bathing Steps Patient Completed: Chest;Right Arm;Left Arm;Abdomen;Front perineal area;Buttocks;Right upper leg;Left upper leg;Right lower leg (including foot);Left lower leg (including foot) Bathing: 5: Set-up assist to: Obtain items  FIM - Upper Body Dressing/Undressing Upper body dressing/undressing steps patient completed: Thread/unthread right sleeve of pullover shirt/dresss;Thread/unthread left sleeve of pullover shirt/dress;Put head through opening of pull over shirt/dress;Pull shirt over trunk Upper body dressing/undressing: 5: Set-up assist to: Obtain clothing/put away FIM - Lower Body Dressing/Undressing Lower body dressing/undressing steps patient completed: Thread/unthread right underwear leg;Thread/unthread left underwear leg;Thread/unthread right pants leg;Thread/unthread left pants leg;Pull pants up/down;Don/Doff left shoe;Don/Doff right shoe;Don/Doff right sock;Don/Doff left sock;Fasten/unfasten right shoe;Fasten/unfasten left shoe Lower body dressing/undressing: 4: Steadying Assist  FIM - Toileting Toileting steps completed by patient: Adjust clothing prior to toileting;Performs perineal hygiene;Adjust clothing after toileting Toileting Assistive Devices: Grab bar or rail for support Toileting: 5: Supervision: Safety issues/verbal cues  FIM - Radio producer Devices: Environmental consultant (practiced with urinal) Toilet Transfers: 4-To toilet/BSC: Min A (steadying Pt. > 75%);4-From toilet/BSC: Min A (steadying Pt. > 75%)  FIM - Bed/Chair Transfer Bed/Chair Transfer Assistive Devices: Copy: 5: Sit > Supine: Supervision (verbal cues/safety issues);5: Bed > Chair or W/C: Supervision (verbal cues/safety issues);5: Chair or W/C > Bed: Supervision (verbal cues/safety issues)  FIM - Locomotion: Wheelchair Distance: 120 Locomotion: Wheelchair: 0: Activity did not occur FIM - Locomotion: Ambulation Locomotion: Ambulation Assistive Devices:  Administrator Ambulation/Gait Assistance: 5: Supervision Locomotion: Ambulation: 1: Travels less than 50 ft with supervision/safety issues  Comprehension Comprehension Mode: Auditory Comprehension: 5-Understands complex 90% of the time/Cues < 10% of the time  Expression Expression Mode: Verbal Expression: 5-Expresses complex 90% of the time/cues < 10% of the time  Social Interaction Social Interaction: 6-Interacts appropriately with others with medication or extra time (anti-anxiety, antidepressant).  Problem Solving Problem Solving: 5-Solves basic problems: With no assist  Memory Memory: 5-Recognizes or recalls 90% of the time/requires cueing < 10% of the time  Medical Problem List and Plan:  1. Functional deficits secondary to displaced left femoral neck fracture.  2. DVT Prophylaxis/Anticoagulation: Pharmaceutical: Coumadin  3. Pain Management: Will add voltaren gel for right knee. K pad for lumbago. Continue oxycodone prn  4. Mood: Team to provide ego support. LCSW to follow for evaluation and support.  5. Neuropsych: This patient is capable of making decisions on her own behalf.  6. Skin/Wound Care: Routine pressure relief measures. Keep heels elevated .  7. Urinary retention:    -ucx neg, re-check with 15k staph---no treat   -off abx  -encourage double voids 8. Constipation:   Increased miralax to bid. Beginning to move bowels better  9. SSS with PPM/ A fib:    - coumadin.   -norvasc stopped again by cards  -appreciate cards assist  -metoprolol increased to 50mg  bid  -continue to push fluids. Recheck BMET  -abd binder/THT's, check orthostatics Q am 10 Post op fever: IS. Resolved    11. Hypokalemia: supplemented  .    LOS (Days) 10 A FACE TO FACE EVALUATION WAS PERFORMED  Anna Farrell 08/29/2013, 6:49 AM

## 2013-08-30 ENCOUNTER — Inpatient Hospital Stay (HOSPITAL_COMMUNITY): Payer: Medicare Other

## 2013-08-30 DIAGNOSIS — Z5189 Encounter for other specified aftercare: Secondary | ICD-10-CM

## 2013-08-30 DIAGNOSIS — I951 Orthostatic hypotension: Secondary | ICD-10-CM

## 2013-08-30 DIAGNOSIS — I4891 Unspecified atrial fibrillation: Secondary | ICD-10-CM

## 2013-08-30 DIAGNOSIS — S72009A Fracture of unspecified part of neck of unspecified femur, initial encounter for closed fracture: Secondary | ICD-10-CM

## 2013-08-30 LAB — BASIC METABOLIC PANEL
Anion gap: 10 (ref 5–15)
BUN: 14 mg/dL (ref 6–23)
CALCIUM: 9.5 mg/dL (ref 8.4–10.5)
CO2: 30 mEq/L (ref 19–32)
CREATININE: 0.67 mg/dL (ref 0.50–1.10)
Chloride: 101 mEq/L (ref 96–112)
GFR, EST NON AFRICAN AMERICAN: 79 mL/min — AB (ref >90)
GLUCOSE: 100 mg/dL — AB (ref 70–99)
Potassium: 3.4 mEq/L — ABNORMAL LOW (ref 3.7–5.3)
Sodium: 141 mEq/L (ref 137–147)

## 2013-08-30 LAB — PROTIME-INR
INR: 2.68 — ABNORMAL HIGH (ref 0.00–1.49)
PROTHROMBIN TIME: 28.5 s — AB (ref 11.6–15.2)

## 2013-08-30 MED ORDER — POTASSIUM CHLORIDE CRYS ER 20 MEQ PO TBCR
20.0000 meq | EXTENDED_RELEASE_TABLET | Freq: Every day | ORAL | Status: DC
  Administered 2013-08-30 – 2013-08-31 (×2): 20 meq via ORAL
  Filled 2013-08-30 (×3): qty 1

## 2013-08-30 NOTE — Progress Notes (Signed)
Occupational Therapy Discharge Summary  Patient Details  Name: Anna Farrell MRN: [id number] Date of Birth: 03-19-1927   Patient has met 10 of 11 long term goals due to improved activity tolerance, improved balance, ability to compensate for deficits and improved attention.  Patient demonstrated competence with use of AE and ability to self-monitor and maintain hip precautions during BADL.   Pt to discharge at overall Modified Independent level and verbalizes excellent support network of family and friends available to assist with complex iADL.  Patient's care partner is independent to provide the necessary physical assistance at discharge to assist with managing modifications to home environment, as needed, to insure safety and reduce stress.   Pt reports awareness of her bed height as being impractical and potentially dangerous at 28" but states family is investigating use of DME or modifications to reduce height per her preferences, pending her return home first.    Reasons goals not met: deferred laundry goal per pt preference  Recommendation:  Patient will benefit from ongoing skilled OT services in home health setting to continue to advance functional skills in the area of iADL.  Equipment: No equipment provided  Reasons for discharge: discharge from hospital  Patient/family agrees with progress made and goals achieved: Yes  OT Discharge Precautions/Restrictions  Precautions Precautions: Posterior Hip;Fall Precaution Comments: pt verbalized 3/3 hip precautions.   Restrictions Weight Bearing Restrictions: Yes LLE Weight Bearing: Weight bearing as tolerated  Vital Signs Therapy Vitals Temp: 98.4 F (36.9 C) Temp src: Oral Pulse Rate: 73 Resp: 18 BP: 135/86 mmHg Patient Position (if appropriate): Lying  Pain Pain Assessment Pain Assessment: No/denies pain Pain Score: Asleep Pain Type: Acute pain Pain Location: Hip Pain Orientation: Left Pain Descriptors / Indicators:  Sore Pain Frequency: Intermittent Pain Onset: With Activity Pain Intervention(s): Medication (See eMAR)  ADL ADL ADL Comments: see FIM  Vision/Perception  Vision- History Baseline Vision/History: Glaucoma;Wears glasses Wears Glasses: Distance only Patient Visual Report: No change from baseline Vision- Assessment Vision Assessment?: No apparent visual deficits Perception Comments: WFL   Cognition Overall Cognitive Status: Within Functional Limits for tasks assessed Arousal/Alertness: Awake/alert Orientation Level: Oriented X4 Attention: Alternating Alternating Attention: Appears intact Memory: Appears intact Awareness: Appears intact Problem Solving: Appears intact Safety/Judgment: Appears intact  Sensation Sensation Light Touch: Appears Intact Stereognosis: Appears Intact Hot/Cold: Appears Intact Proprioception: Appears Intact Coordination Gross Motor Movements are Fluid and Coordinated: Yes Fine Motor Movements are Fluid and Coordinated: Yes  Motor  Motor Motor: Within Functional Limits  Mobility  Bed Mobility Supine to Sit: 6: Modified independent (Device/Increase time) Sit to Supine: 6: Modified independent (Device/Increase time) Transfers Sit to Stand: 6: Modified independent (Device/Increase time);With armrests;From chair/3-in-1;From bed Stand to Sit: 6: Modified independent (Device/Increase time);To bed;With armrests;To chair/3-in-1   Trunk/Postural Assessment  Cervical Assessment Cervical Assessment: Exceptions to [facility] Cervical AROM Overall Cervical AROM: Deficits Overall Cervical AROM Comments: forward head Thoracic Assessment Thoracic Assessment: Exceptions to [facility] Thoracic AROM Overall Thoracic AROM: Deficits;Due to premorid status Overall Thoracic AROM Comments: kyphotic in seated and standing positions Lumbar Assessment Lumbar Assessment: Within Functional Limits Postural Control Postural Control: Deficits on evaluation Postural  Limitations: forward head and kyphotic   Balance Static Sitting Balance Static Sitting - Balance Support: Feet supported;Bilateral upper extremity supported Static Sitting - Level of Assistance: 6: Modified independent (Device/Increase time) Dynamic Sitting Balance Dynamic Sitting - Balance Support: Left upper extremity supported;Feet supported;Right upper extremity supported Dynamic Sitting - Level of Assistance: 6: Modified independent (Device/Increase time) Dynamic Sitting - Balance  Activities: Lateral lean/weight shifting;Forward lean/weight shifting;Reaching for Consulting civil engineer Standing - Balance Support: Bilateral upper extremity supported Static Standing - Level of Assistance: 6: Modified independent (Device/Increase time) Dynamic Standing Balance Dynamic Standing - Balance Support: Bilateral upper extremity supported Dynamic Standing - Level of Assistance: 6: Modified independent (Device/Increase time) Dynamic Standing - Balance Activities: Lateral lean/weight shifting;Forward lean/weight shifting;Reaching for objects  Extremity/Trunk Assessment RUE Assessment RUE Assessment: Within Functional Limits LUE Assessment LUE Assessment: Within Functional Limits  See FIM for current functional status  Hyder 08/31/2013, 7:17 AM

## 2013-08-30 NOTE — Progress Notes (Signed)
Subjective/Complaints: 80 y.o. female with history of SSS, A fib with PPM, orthostatic hypotension, Right knee OA, OSA (new to CPAP); who slipped and fell while in [place] on on 08/15/13 with subsequent displaced left femoral neck fracture. She was transferred to [facility] per family request and was evaluated by Dr. Alain Marion. Chronic coumadin reversed and patient underwent left hip bipolar hemi on 08/17/13. Post op is WBAT and coumadin resumed  Feels good this morning other than some left hip pain. Still having some drops in bp  Review of Systems -   Objective: Vital Signs: Blood pressure 155/78, pulse 84, temperature 98.4 F (36.9 C), temperature source Oral, resp. rate 17, height _0  (1.575 m), weight 66.4 kg (146 lb 6.2 oz), SpO2 97.00%. No results found. Results for orders placed during the hospital encounter of 08/19/13 (from the past 72 hour(s))  PROTIME-INR     Status: Abnormal   Collection Time    08/30/13  6:25 AM      Result Value Ref Range   Prothrombin Time 28.5 (*) 11.6 - 15.2 seconds   INR 2.68 (*) 0.00 - 7.98  BASIC METABOLIC PANEL     Status: Abnormal   Collection Time    08/30/13  6:25 AM      Result Value Ref Range   Sodium 141  137 - 147 mEq/L   Potassium 3.4 (*) 3.7 - 5.3 mEq/L   Chloride 101  96 - 112 mEq/L   CO2 30  19 - 32 mEq/L   Glucose, Bld 100 (*) 70 - 99 mg/dL   BUN 14  6 - 23 mg/dL   Creatinine, Ser 0.67  0.50 - 1.10 mg/dL   Calcium 9.5  8.4 - 10.5 mg/dL   GFR calc non Af Amer 79 (*) >90 mL/min   GFR calc Af Amer >90  >90 mL/min   Comment: (NOTE)     The eGFR has been calculated using the CKD EPI equation.     This calculation has not been validated in all clinical situations.     eGFR's persistently <90 mL/min signify possible Chronic Kidney     Disease.   Anion gap 10  5 - 15     HEENT: normal Cardio: irregular,   60's Resp: CTA B/L and unlabored GI: BS positive and NT,ND Extremity:  Pulses positive and trace LLE Edema,  Skin:   Intact  and Wound without sutures or staples Neuro: Alert/Oriented and Abnormal Motor 5/5 in BUE, 4/5 RLE, trace Left HF, KE, 4- ADF Musc/Skel:    Left lateral thigh mildly tender, left lateral leg a little sore, no bruising or swellin Gen NAD   Assessment/Plan: 1. Functional deficits secondary to Left femoral neck fx which require 3+ hours per day of interdisciplinary therapy in a comprehensive inpatient rehab setting. Physiatrist is providing close team supervision and 24 hour management of active medical problems listed below. Physiatrist and rehab team continue to assess barriers to discharge/monitor patient progress toward functional and medical goals.    Finalize dc planning for tomorrow  FIM: FIM - Bathing Bathing Steps Patient Completed: Chest;Right upper leg;Right Arm;Left upper leg;Left Arm;Abdomen;Left lower leg (including foot);Right lower leg (including foot);Front perineal area;Buttocks Bathing: 5: Supervision: Safety issues/verbal cues  FIM - Upper Body Dressing/Undressing Upper body dressing/undressing steps patient completed: Thread/unthread right bra strap;Hook/unhook bra;Thread/unthread left bra strap;Thread/unthread right sleeve of pullover shirt/dresss;Put head through opening of pull over shirt/dress;Thread/unthread left sleeve of pullover shirt/dress;Pull shirt over trunk Upper body dressing/undressing: 5: Supervision:  Safety issues/verbal cues FIM - Lower Body Dressing/Undressing Lower body dressing/undressing steps patient completed: Thread/unthread right underwear leg;Thread/unthread left pants leg;Don/Doff left sock;Thread/unthread left underwear leg;Pull pants up/down;Pull underwear up/down;Fasten/unfasten pants;Thread/unthread right pants leg;Don/Doff right sock (used reacher and sock aide for donning socks and pants to adhere to Total hip precautions) Lower body dressing/undressing: 5: Supervision: Safety issues/verbal cues  FIM - Toileting Toileting steps completed  by patient: Adjust clothing prior to toileting;Performs perineal hygiene;Adjust clothing after toileting Toileting Assistive Devices: Grab bar or rail for support Toileting: 5: Supervision: Safety issues/verbal cues  FIM - Radio producer Devices: Insurance account manager Transfers: 5-To toilet/BSC: Supervision (verbal cues/safety issues);5-From toilet/BSC: Supervision (verbal cues/safety issues)  FIM - Control and instrumentation engineer Devices: Copy: 5: Supine > Sit: Supervision (verbal cues/safety issues);5: Sit > Supine: Supervision (verbal cues/safety issues);5: Bed > Chair or W/C: Supervision (verbal cues/safety issues)  FIM - Locomotion: Wheelchair Distance: 120 Locomotion: Wheelchair: 0: Activity did not occur FIM - Locomotion: Ambulation Locomotion: Ambulation Assistive Devices: Administrator Ambulation/Gait Assistance: 5: Supervision Locomotion: Ambulation: 1: Travels less than 50 ft with supervision/safety issues  Comprehension Comprehension Mode: Auditory Comprehension: 5-Understands complex 90% of the time/Cues < 10% of the time  Expression Expression Mode: Verbal Expression: 5-Expresses complex 90% of the time/cues < 10% of the time  Social Interaction Social Interaction: 6-Interacts appropriately with others with medication or extra time (anti-anxiety, antidepressant).  Problem Solving Problem Solving: 5-Solves basic problems: With no assist  Memory Memory: 5-Recognizes or recalls 90% of the time/requires cueing < 10% of the time  Medical Problem List and Plan:  1. Functional deficits secondary to displaced left femoral neck fracture.  2. DVT Prophylaxis/Anticoagulation: Pharmaceutical: Coumadin  3. Pain Management:   voltaren gel for right knee. K pad for lumbago. Continue oxycodone prn   -ice prn 4. Mood: Team to provide ego support. LCSW to follow for evaluation and support.  5. Neuropsych: This patient  is capable of making decisions on her own behalf.  6. Skin/Wound Care: Routine pressure relief measures. Keep heels elevated .  7. Urinary retention:    -ucx neg, re-check with 15k staph---no treat   -off abx  -encourage double voids 8. Constipation:   Increased miralax to bid. Beginning to move bowels better  9. SSS with PPM/ A fib: better activity tolerance   - coumadin.   -appreciate cards assist  -metoprolol increased to 84m bid  -continue to push fluids.    -abd binder/THT's,   10 Post op fever: IS. Resolved    11. Hypokalemia: continue to supplement    LOS (Days) 11 A FACE TO FACE EVALUATION WAS PERFORMED  SWARTZ,ZACHARY T 08/30/2013, 7:50 AM

## 2013-08-30 NOTE — Progress Notes (Signed)
Physical Therapy Session Note  Patient Details  Name: Anna Farrell MRN: [id number] Date of Birth: 04/25/1927  Today's Date: 08/30/2013 PT Individual Time: Treatment Session 1: 0900-1000; Treatment Session 2: 2505-3976 PT Individual Time Calculation (min): Treatment Session 1: 60 min; Treatment Session 2: 79min  Short Term Goals: Week 2:  PT Short Term Goal 1 (Week 2): STG=LTGs  Skilled Therapeutic Interventions/Progress Updates:  Treatment Session 1:  1:1. Pt received sitting in w/c, ready for therapy. Focus this session on d/c planning and formal reassessment of functional transfers and mobility. Pt educated on home environment safety, fall prevention and goals of HH PT. Pt able to demonstrate w/c propulsion 155'x1 and 160'x1, ambulation w/ RW 150'x1 in controlled environment, ambulation w/ RW 30'x1 in home environment, t/f sup<>sit in standard bed at 20" height, all t/f sit<>stand from various surfaces. Pt able to demonstrate simulated car t/f and stair negotiation up/down 4 steps w/ B rail at supervision level. While practicing bed mobility on tx mat elevated to 28" height to mimic bed at home, pt became lightheaded stating need to lie down, demonstrating excellent emergent awareness. Pt assisted to supine position for management of symptoms. Orthostatic vitals assessed: -Lying: 129/89 -Sitting: 100/77 -Standing: 68/42 Pt assisted back to supine for recovery. Pt eventually able to t/f sup>sit EOB and SPT bed>w/c w/ RW mod(I). Pt propelled self back to room and positioned in recliner at end of session w/ B LE elevated. Pt left w/ all needs in reach, reported feeling better, RN made aware.   Treatment Session 2:  1:1. Pt received sitting in recliner, ready for therapy. Focus this session on functional endurance, bed mobility and review of HEP. Pt mod(I) for ambulation room>ortho gym and mod(I) for w/c propulsion ortho gym>room. Pt able to demonstrate mod(I) t/f sup<>sit EOB on elevated tx mat  (28") to mimic bed at home. Pt utilizing RW for UE support. Pt reporting that family is going to install bed rails at home and son can make sturdy stool for increased ease to sit on bed if needed. Reviewed supine HEP, pt performed a few exercises of each for overall review and demonstrated understanding. Pt left sitting in w/c at end of session in care of nurse tech. Pt made mod(I) in room, pt verbalized understanding regarding need to call for assist if experiencing s/s of low BP. RN made aware.   Therapy Documentation Precautions:  Precautions Precautions: Posterior Hip;Fall Precaution Booklet Issued: Yes (comment) Precaution Comments: pt verbalized 3/3 hip precautions.   Restrictions Weight Bearing Restrictions: No LLE Weight Bearing: Weight bearing as tolerated Pain: Pain Assessment Pain Assessment: 0-10 Pain Score: 5  Pain Type: Acute pain Pain Location: Hip Pain Orientation: Left Pain Descriptors / Indicators: Aching Pain Onset: With Activity Pain Intervention(s): Repositioned;Rest (RN reported that pt recieved pain medicine prior to session)  See FIM for current functional status  Therapy/Group: Individual Therapy  Gilmore Laroche 08/30/2013, 10:02 AM

## 2013-08-30 NOTE — Progress Notes (Signed)
Occupational Therapy Session Note  Patient Details  Name: Anna Farrell MRN: [id number] Date of Birth: 08/09/1927  Today's Date: 08/30/2013 OT Individual Time: 2505-3976 OT Individual Time Calculation (min): 45 min   Short Term Goals: Week 2:  OT Short Term Goal 1 (Week 2): STG= LTGs secondary to remaninder of LOS  Skilled Therapeutic Interventions/Progress Updates: ADL-retraining with focus on transfers (toilet, bed), bed mobility, sit<>stand, standing tolerance, functional mobility using RW, endurance, and effective use of AE (reacher, sock aid, leg lifter, elastic shoe laces).   Pt received seated in her recliner reporting awareness of planned discharge, 08/31/13.   Pt anticipates possible need for assist with bed mobility and transfer to bed d/t height of bed at 28".   OT educated pt on use of SuperPole (AE device) and portable platform and pt states that her children are also exploring options for her bed (ex: removing box spring and/or installing rails).    Pt reiterates plan to bathe at sink, dress at edge of bed or in chair, and delegate iADL as feasible.    Pt reported need to void urine and ambulated from recliner to toilet with supervision, completed transfer unassisted and managed clothing with min assist to re-affix diaper.   Pt reports recent episode of incontinence as reason for diaper, although she anticipates no further need s/p discharge.    Pt bathed and dressed upper body at sink with only setup assist but claims she is able to manage supplies and spaces independently with extra time provided.    Pt completed ADL unassisted and elected to return to bed at end of session, donning socks and shoes at edge of bed firs, as requested, to demonstrate her proficiency with use of reacher, sock aid, and elastic shoe laces.   Pt recovered to bed using leg lifter with extra time to lift right leg into bed; pt was independent with lifting left leg.   Pt left supine in bed with call light placed  within reach.     Therapy Documentation Precautions:  Precautions Precautions: Posterior Hip;Fall Precaution Booklet Issued: Yes (comment) Precaution Comments: pt verbalized 3/3 hip precautions.   Restrictions Weight Bearing Restrictions: Yes LLE Weight Bearing: Weight bearing as tolerated  Vital Signs: Therapy Vitals Temp: 98.2 F (36.8 C) Temp src: Oral Pulse Rate: 85 Resp: 18 BP: 110/72 mmHg Patient Position (if appropriate): Lying (pt had being lying <54mins) Oxygen Therapy SpO2: 97 % O2 Device: None (Room air)  Pain: No/denies pain  See FIM for current functional status  Therapy/Group: Individual Therapy  [place] 08/30/2013, 3:12 PM

## 2013-08-30 NOTE — Progress Notes (Signed)
Pt does not want to wear Cpap,does not like our masks.

## 2013-08-30 NOTE — Progress Notes (Signed)
ANTICOAGULATION CONSULT NOTE - Follow Up Consult  Pharmacy Consult for coumadin Indication: atrial fibrillation  Allergies  Allergen Reactions  . Betadine [Povidone Iodine]     burning  . Codeine Nausea And Vomiting    Passed out    Patient Measurements: Height: 5\' 2"  (157.5 cm) Weight: 146 lb 6.2 oz (66.4 kg) IBW/kg (Calculated) : 50.1 Heparin Dosing Weight:   Vital Signs: Temp: 98.4 F (36.9 C) (08/24 0546) Temp src: Oral (08/24 0546) BP: 155/78 mmHg (08/24 0546) Pulse Rate: 84 (08/24 0546)  Labs:  Recent Labs  08/30/13 0625  LABPROT 28.5*  INR 2.68*  CREATININE 0.67    Estimated Creatinine Clearance: 47.6 ml/min (by C-G formula based on Cr of 0.67).   Medications:  Scheduled:  . cholecalciferol  2,000 Units Oral Daily  . feeding supplement (ENSURE COMPLETE)  237 mL Oral BID BM  . fluticasone  2 puff Inhalation BID  . iron polysaccharides  150 mg Oral Q breakfast  . metoprolol succinate  50 mg Oral BID  . polyethylene glycol  17 g Oral BID  . potassium chloride  20 mEq Oral Daily  . simvastatin  20 mg Oral q1800  . traMADol  50 mg Oral 4 times per day  . vitamin B-12  100 mcg Oral Weekly  . warfarin  3 mg Oral Once per day on Sun Mon Tue Wed Fri Sat  . warfarin  4.5 mg Oral Once per day on Thu  . Warfarin - Pharmacist Dosing Inpatient   Does not apply q1800   Infusions:    Assessment: 80 yo female with afib is currently on therapeutic coumadin.  INR today is 2.68. Goal of Therapy:  INR 2-3 Monitor platelets by anticoagulation protocol: Yes   Plan:  -cont home dosing of 3mg  daily except 4.5mg  on Thursdays -MWF INR   Nikolas Casher, [name] 08/30/2013,8:08 AM

## 2013-08-30 NOTE — Progress Notes (Signed)
Physical Therapy Discharge Summary  Patient Details  Name: Anna Farrell MRN: [id number] Date of Birth: July 25, 1927  Today's Date: 08/30/2013  Patient has met 9 of 9 long term goals due to improved activity tolerance, improved balance, increased strength and decreased pain.  Patient to discharge at an ambulatory level Modified Independent w/ RW w/ Supervision for stairs, car transfers. Pt demonstrated independence in directing care during functional mobility.   Reasons goals not met: N/A  Recommendation:  Patient will benefit from ongoing skilled PT services in home health setting to continue to advance safe functional mobility, address ongoing impairments in balance, functional ambulation, minimize fall risk, and ensure safe transition to home environment.  Equipment: No equipment provided, pt owned [place] previously.  Reasons for discharge: treatment goals met and discharge from hospital  Patient/family agrees with progress made and goals achieved: Yes  PT Discharge Precautions/Restrictions Precautions Precautions: Posterior Hip;Fall Precaution Comments: pt verbalized 3/3 hip precautions.   Restrictions Weight Bearing Restrictions: No LLE Weight Bearing: Weight bearing as tolerated Vital Signs Therapy Vitals Temp: 98.4 F (36.9 C) Temp src: Oral Pulse Rate: 84 Resp: 17 BP: 155/78 mmHg Patient Position (if appropriate): Orthostatic Vitals Oxygen Therapy SpO2: 94 % Pain Pain Assessment Pain Assessment: 0-10 Pain Score: 5  Pain Type: Acute pain Pain Location: Hip Pain Orientation: Left Pain Descriptors / Indicators: Aching Pain Onset: With Activity Pain Intervention(s): Repositioned;Rest (RN reported that pt recieved pain medicine prior to session) Vision/Perception  See OT D/C Cognition Overall Cognitive Status: Within Functional Limits for tasks assessed Arousal/Alertness: Awake/alert Orientation Level: Oriented X4 Attention: Selective Selective Attention: Appears  intact Memory: Appears intact Awareness: Appears intact Problem Solving: Appears intact Safety/Judgment: Appears intact Sensation Sensation Light Touch: Appears Intact Proprioception: Appears Intact Coordination Gross Motor Movements are Fluid and Coordinated: Yes Fine Motor Movements are Fluid and Coordinated: Yes Motor  Motor Motor: Within Functional Limits  Mobility Bed Mobility Bed Mobility: Supine to Sit;Sit to Supine Supine to Sit: 6: Modified independent (Device/Increase time) Sit to Supine: 6: Modified independent (Device/Increase time) Transfers Transfers: Yes Sit to Stand: 6: Modified independent (Device/Increase time);With armrests;From chair/3-in-1;From bed Stand to Sit: 6: Modified independent (Device/Increase time);To bed;With armrests;To chair/3-in-1 Locomotion  Ambulation Ambulation: Yes Ambulation/Gait Assistance: 6: Modified independent (Device/Increase time) Ambulation Distance (Feet): 175 Feet Assistive device: Rolling walker Gait Gait: Yes Gait Pattern: Impaired Gait Pattern: Step-to pattern;Step-through pattern;Antalgic;Decreased stance time - right;Decreased step length - right;Decreased dorsiflexion - right;Decreased weight shift to right Gait velocity: decreased Stairs / Additional Locomotion Stairs: Yes Stairs Assistance: 5: Supervision Stairs Assistance Details: Verbal cues for gait pattern Stair Management Technique: Two rails;Forwards;Step to pattern Number of Stairs: 4 Wheelchair Mobility Wheelchair Mobility: Yes Wheelchair Assistance: 6: Modified independent (Device/Increase time) Environmental health practitioner: Both upper extremities Wheelchair Parts Management: Independent Distance: 160'  Trunk/Postural Assessment  Cervical Assessment Cervical Assessment: Exceptions to [facility] Cervical AROM Overall Cervical AROM: Deficits Overall Cervical AROM Comments: forward head Thoracic Assessment Thoracic Assessment: Exceptions to [facility] Thoracic  AROM Overall Thoracic AROM: Deficits;Due to premorid status Overall Thoracic AROM Comments: kyphotic in seated and standing positions Lumbar Assessment Lumbar Assessment: Within Functional Limits Postural Control Postural Control: Deficits on evaluation Postural Limitations: forward head and kyphotic  Balance Balance Balance Assessed: Yes Static Sitting Balance Static Sitting - Balance Support: Feet supported;Bilateral upper extremity supported Static Sitting - Level of Assistance: 6: Modified independent (Device/Increase time) Dynamic Sitting Balance Dynamic Sitting - Balance Support: Left upper extremity supported;Feet supported;Right upper extremity supported Dynamic Sitting - Level of Assistance: 6:  Modified independent (Device/Increase time) Dynamic Sitting - Balance Activities: Lateral lean/weight shifting;Forward lean/weight shifting;Reaching for objects Static Standing Balance Static Standing - Balance Support: Bilateral upper extremity supported Static Standing - Level of Assistance: 6: Modified independent (Device/Increase time) Dynamic Standing Balance Dynamic Standing - Balance Support: Bilateral upper extremity supported Dynamic Standing - Balance Activities: Lateral lean/weight shifting;Forward lean/weight shifting;Reaching for objects Extremity Assessment  RUE Assessment RUE Assessment: Within Functional Limits LUE Assessment LUE Assessment: Within Functional Limits RLE Assessment RLE Assessment: Exceptions to [facility] RLE Strength RLE Overall Strength: Deficits;Due to premorbid status RLE Overall Strength Comments: Grossly 3+ to 4/5 - pt mainly physcially inactive at home due to chronic LBP LLE Strength Left Hip Flexion: 3-/5 Left Knee Flexion: 4/5 Left Knee Extension: 4/5 (4-/5) Left Ankle Dorsiflexion: 4/5 (4+/5)  See FIM for current functional status  Gilmore Laroche, PT, DPT Rada Hay, PT, DPT 08/30/2013, 5:19 PM

## 2013-08-31 DIAGNOSIS — I951 Orthostatic hypotension: Secondary | ICD-10-CM

## 2013-08-31 DIAGNOSIS — S72009A Fracture of unspecified part of neck of unspecified femur, initial encounter for closed fracture: Secondary | ICD-10-CM

## 2013-08-31 DIAGNOSIS — Z5189 Encounter for other specified aftercare: Secondary | ICD-10-CM

## 2013-08-31 DIAGNOSIS — I4891 Unspecified atrial fibrillation: Secondary | ICD-10-CM

## 2013-08-31 MED ORDER — ACETAMINOPHEN 325 MG PO TABS: 325.0000 mg | ORAL_TABLET | Freq: Four times a day (QID) | ORAL | Status: DC | PRN

## 2013-08-31 MED ORDER — TRAMADOL HCL 50 MG PO TABS
50.0000 mg | ORAL_TABLET | Freq: Four times a day (QID) | ORAL | Status: DC
Start: 2013-08-31 — End: 2014-03-01

## 2013-08-31 MED ORDER — POTASSIUM CHLORIDE CRYS ER 20 MEQ PO TBCR: 20.0000 meq | EXTENDED_RELEASE_TABLET | Freq: Every day | ORAL | Status: DC

## 2013-08-31 MED ORDER — OXYCODONE HCL 5 MG PO TABS: 5.0000 mg | ORAL_TABLET | Freq: Four times a day (QID) | ORAL | Status: DC | PRN

## 2013-08-31 MED ORDER — METOPROLOL SUCCINATE ER 50 MG PO TB24: 50.0000 mg | ORAL_TABLET | Freq: Two times a day (BID) | ORAL | Status: DC

## 2013-08-31 MED ORDER — POLYSACCHARIDE IRON COMPLEX 150 MG PO CAPS: 150.0000 mg | ORAL_CAPSULE | Freq: Every day | ORAL | Status: DC

## 2013-08-31 NOTE — Discharge Instructions (Signed)
Inpatient Rehab Discharge Instructions  MARDIE KELLEN Discharge date and time: 08/31/13   Activities/Precautions/ Functional Status: Activity: activity as tolerated Diet: cardiac diet Wound Care: keep wound clean and dry Functional status:  ___ No restrictions     ___ Walk up steps independently _X__ 24/7 supervision/assistance   ___ Walk up steps with assistance ___ Intermittent supervision/assistance  ___ Bathe/dress independently _X__ Walk with walker    _X__ Bathe/dress with assistance ___ Walk Independently    ___ Shower independently _X__ Walk with assistance    ___ Shower with assistance _X__ No alcohol     ___ Return to work/school ________    COMMUNITY REFERRALS UPON DISCHARGE:    Home Health:   PT     OT     RN    CNA (bath aide)                      Agency: [place] Phone: [phone]        Special Instructions: 1. Use abdominal binder when out of bed. Loosen when in chair and remove when in bed. 2. Wear support stocking every morning and remove at bedtime. This is to help with blood pressure as well as control swelling in legs.    My questions have been answered and I understand these instructions. I will adhere to these goals and the provided educational materials after my discharge from the hospital.  Patient/Caregiver Signature _______________________________ Date __________  Clinician Signature _______________________________________ Date __________  Please bring this form and your medication list with you to all your follow-up doctor's appointments.

## 2013-08-31 NOTE — Progress Notes (Signed)
Pt. Got d/c instructions and prescriptions,follow up appointments.Pt. Ready to go home with family.

## 2013-08-31 NOTE — Progress Notes (Signed)
Subjective/Complaints: 80 y.o. female with history of SSS, A fib with PPM, orthostatic hypotension, Right knee OA, OSA (new to CPAP); who slipped and fell while in [place] on on 08/15/13 with subsequent displaced left femoral neck fracture. She was transferred to [facility] per family request and was evaluated by Dr. Alain Marion. Chronic coumadin reversed and patient underwent left hip bipolar hemi on 08/17/13. Post op is WBAT and coumadin resumed  Had a good night. Excited to go today. Mod I in the room.  Review of Systems -   Objective: Vital Signs: Blood pressure 135/86, pulse 73, temperature 98.4 F (36.9 C), temperature source Oral, resp. rate 18, height $RemoveBe'5\' 2"'BsQEcjzzo$  (1.575 m), weight 66.4 kg (146 lb 6.2 oz), SpO2 96.00%. No results found. Results for orders placed during the hospital encounter of 08/19/13 (from the past 72 hour(s))  PROTIME-INR     Status: Abnormal   Collection Time    08/30/13  6:25 AM      Result Value Ref Range   Prothrombin Time 28.5 (*) 11.6 - 15.2 seconds   INR 2.68 (*) 0.00 - 2.02  BASIC METABOLIC PANEL     Status: Abnormal   Collection Time    08/30/13  6:25 AM      Result Value Ref Range   Sodium 141  137 - 147 mEq/L   Potassium 3.4 (*) 3.7 - 5.3 mEq/L   Chloride 101  96 - 112 mEq/L   CO2 30  19 - 32 mEq/L   Glucose, Bld 100 (*) 70 - 99 mg/dL   BUN 14  6 - 23 mg/dL   Creatinine, Ser 0.67  0.50 - 1.10 mg/dL   Calcium 9.5  8.4 - 10.5 mg/dL   GFR calc non Af Amer 79 (*) >90 mL/min   GFR calc Af Amer >90  >90 mL/min   Comment: (NOTE)     The eGFR has been calculated using the CKD EPI equation.     This calculation has not been validated in all clinical situations.     eGFR's persistently <90 mL/min signify possible Chronic Kidney     Disease.   Anion gap 10  5 - 15     HEENT: normal Cardio: irregular,   60's Resp: CTA B/L and unlabored GI: BS positive and NT,ND Extremity:  Pulses positive and trace LLE Edema,  Skin:   Intact and Wound without sutures  or staples Neuro: Alert/Oriented and Abnormal Motor 5/5 in BUE, 4/5 RLE, trace Left HF, KE, 4- ADF Musc/Skel:    Left lateral thigh mildly tender, left lateral leg a little sore, no bruising or swellin Gen NAD   Assessment/Plan: 1. Functional deficits secondary to Left femoral neck fx which require 3+ hours per day of interdisciplinary therapy in a comprehensive inpatient rehab setting. Physiatrist is providing close team supervision and 24 hour management of active medical problems listed below. Physiatrist and rehab team continue to assess barriers to discharge/monitor patient progress toward functional and medical goals.    Dc home today  FIM: FIM - Bathing Bathing Steps Patient Completed: Chest;Right Arm;Left Arm;Abdomen;Front perineal area;Buttocks;Right upper leg;Left upper leg;Right lower leg (including foot);Left lower leg (including foot) Bathing: 6: Assistive device (Comment) (LH sponge, sitting/standing at sink)  FIM - Upper Body Dressing/Undressing Upper body dressing/undressing steps patient completed: Thread/unthread right bra strap;Thread/unthread left bra strap;Hook/unhook bra;Thread/unthread right sleeve of pullover shirt/dresss;Thread/unthread left sleeve of pullover shirt/dress;Put head through opening of pull over shirt/dress;Pull shirt over trunk Upper body dressing/undressing: 6: More than  reasonable amount of time FIM - Lower Body Dressing/Undressing Lower body dressing/undressing steps patient completed: Thread/unthread right underwear leg;Thread/unthread left underwear leg;Pull underwear up/down;Thread/unthread right pants leg;Thread/unthread left pants leg;Pull pants up/down;Don/Doff right sock;Don/Doff left sock;Don/Doff right shoe;Fasten/unfasten right shoe;Don/Doff left shoe;Fasten/unfasten left shoe Lower body dressing/undressing: 6: Assistive device (Comment) (reacher, sock aid, elastic shoe laces)  FIM - Toileting Toileting steps completed by patient: Adjust  clothing prior to toileting;Performs perineal hygiene;Adjust clothing after toileting Toileting Assistive Devices: Grab bar or rail for support Toileting: 6: More than reasonable amount of time  FIM - Radio producer Devices: Nurse, learning disability Transfers: 6-To toilet/ BSC;6-From toilet/BSC  FIM - Control and instrumentation engineer Devices: Environmental consultant;Arm rests Bed/Chair Transfer: 6: Sit > Supine: No assist;6: Bed > Chair or W/C: No assist;6: Chair or W/C > Bed: No assist;6: Supine > Sit: No assist  FIM - Locomotion: Wheelchair Distance: 160' Locomotion: Wheelchair: 6: Travels 150 ft or more, turns around, maneuvers to table, bed or toilet, negotiates 3% grade: maneuvers on rugs and over door sills independently FIM - Locomotion: Ambulation Locomotion: Ambulation Assistive Devices: Administrator Ambulation/Gait Assistance: 6: Modified independent (Device/Increase time) Locomotion: Ambulation: 6: Travels 150 ft or more with assistive device/no helper  Comprehension Comprehension Mode: Auditory Comprehension: 5-Understands complex 90% of the time/Cues < 10% of the time  Expression Expression Mode: Verbal Expression: 5-Expresses complex 90% of the time/cues < 10% of the time  Social Interaction Social Interaction: 6-Interacts appropriately with others with medication or extra time (anti-anxiety, antidepressant).  Problem Solving Problem Solving: 5-Solves basic problems: With no assist  Memory Memory: 5-Recognizes or recalls 90% of the time/requires cueing < 10% of the time  Medical Problem List and Plan:  1. Functional deficits secondary to displaced left femoral neck fracture.  2. DVT Prophylaxis/Anticoagulation: Pharmaceutical: Coumadin  3. Pain Management:   voltaren gel for right knee. K pad for lumbago. Continue oxycodone prn   -ice prn 4. Mood: Team to provide ego support. LCSW to follow for evaluation and support.  5.  Neuropsych: This patient is capable of making decisions on her own behalf.  6. Skin/Wound Care: Routine pressure relief measures. Keep heels elevated .  7. Urinary retention:    -ucx neg, re-check with 15k staph---no treat   -off abx    8. Constipation:   Increased miralax to bid. Beginning to move bowels better  9. SSS with PPM/ A fib: better activity tolerance   - coumadin.   -appreciate cards assist  -metoprolol increased to $RemoveBefo'50mg'jnpiTCghhlP$  bid  -continue to push fluids.    -abd binder/THT's,   10 Post op fever: IS. Resolved    11. Hypokalemia: continue to supplement    LOS (Days) 12 A FACE TO FACE EVALUATION WAS PERFORMED  Norelle Runnion T 08/31/2013, 7:45 AM

## 2013-08-31 NOTE — Progress Notes (Signed)
Social Work  Discharge Note  The overall goal for the admission was met for:   Discharge location: Yes - home with family providing intermittent, daily assist  Length of Stay: Yes - 12 days  Discharge activity level: Yes - modified independent/ supervision  Home/community participation: Yes  Services provided included: MD, RD, PT, OT, RN, TR, Pharmacy and SW  Financial Services: Medicare and Private Insurance: [place]  Follow-up services arranged: Home Health: RN, PT, OT, bath aide via [facility] and Patient/Family has no preference for HH/DME agencies  Comments (or additional information):  Patient/Family verbalized understanding of follow-up arrangements: Yes  Individual responsible for coordination of the follow-up plan: patient  Confirmed correct DME delivered: NA    Gordana Kewley

## 2013-09-01 ENCOUNTER — Ambulatory Visit (INDEPENDENT_AMBULATORY_CARE_PROVIDER_SITE_OTHER): Payer: Medicare Other | Admitting: Pharmacist

## 2013-09-01 DIAGNOSIS — I255 Ischemic cardiomyopathy: Secondary | ICD-10-CM | POA: Diagnosis not present

## 2013-09-01 DIAGNOSIS — Z471 Aftercare following joint replacement surgery: Secondary | ICD-10-CM | POA: Diagnosis not present

## 2013-09-01 DIAGNOSIS — Z5181 Encounter for therapeutic drug level monitoring: Secondary | ICD-10-CM

## 2013-09-01 DIAGNOSIS — I482 Chronic atrial fibrillation, unspecified: Secondary | ICD-10-CM

## 2013-09-01 DIAGNOSIS — Z96642 Presence of left artificial hip joint: Secondary | ICD-10-CM | POA: Diagnosis not present

## 2013-09-01 DIAGNOSIS — Z7901 Long term (current) use of anticoagulants: Secondary | ICD-10-CM | POA: Diagnosis not present

## 2013-09-01 DIAGNOSIS — I4891 Unspecified atrial fibrillation: Secondary | ICD-10-CM

## 2013-09-01 DIAGNOSIS — D519 Vitamin B12 deficiency anemia, unspecified: Secondary | ICD-10-CM | POA: Diagnosis not present

## 2013-09-01 DIAGNOSIS — I2510 Atherosclerotic heart disease of native coronary artery without angina pectoris: Secondary | ICD-10-CM | POA: Diagnosis not present

## 2013-09-01 DIAGNOSIS — S72002D Fracture of unspecified part of neck of left femur, subsequent encounter for closed fracture with routine healing: Secondary | ICD-10-CM | POA: Diagnosis not present

## 2013-09-01 LAB — POCT INR: INR: 2.2

## 2013-09-02 ENCOUNTER — Encounter: Payer: Self-pay | Admitting: Cardiology

## 2013-09-02 NOTE — Discharge Summary (Signed)
Physician Discharge Summary  Patient ID: Anna Farrell MRN: [id number] DOB/AGE: 1927/08/12 80 y.o.  Admit date: 08/19/2013 Discharge date: 08/31/2013  Discharge Diagnoses:  Principal Problem:   Hip fracture requiring operative repair Active Problems:   HYPERTENSION   Chronic atrial fibrillation   BRADYCARDIA-TACHYCARDIA SYNDROME   Orthostatic hypotension   Asymptomatic PVCs   Cardiac pacemaker in situ   Hyperlipidemia   Acute blood loss anemia   Possible acute diastolic heart failure   Discharged Condition: Stable.    Labs:  Basic Metabolic Panel:  Recent Labs Lab 08/30/13 0625  NA 141  K 3.4*  CL 101  CO2 30  GLUCOSE 100*  BUN 14  CREATININE 0.67  CALCIUM 9.5    CBC: CBC Latest Ref Rng 08/23/2013 08/20/2013 08/19/2013  WBC 4.0 - 10.5 K/uL 7.0 6.2 8.0  Hemoglobin 12.0 - 15.0 g/dL 11.2(L) 9.3(L) 9.6(L)  Hematocrit 36.0 - 46.0 % 35.6(L) 28.7(L) 29.5(L)  Platelets 150 - 400 K/uL 369 163 146(L)     CBG: No results found for this basename: GLUCAP,  in the last 168 hours  Brief HPI:   Anna Farrell is a 80 y.o. female with history of SSS, A fib with PPM, orthostatic hypotension, Right knee OA, OSA (new to CPAP); who slipped and fell while in [place] on on 08/15/13 with subsequent displaced left femoral neck fracture. She was transferred to [facility] per family request and was evaluated by Dr. Alain Marion. Chronic coumadin reversed and patient underwent left hip bipolar hemi on 08/17/13. Post op is WBAT and coumadin resumed.  She has had post op fever of 101 and was placed on levaquin for question PNA v/s UTI. Therapy ongoing and CIR recommended by MD and rehab team.     Hospital Course: Anna Farrell was admitted to rehab 08/19/2013 for inpatient therapies to consist of PT, ST and OT at least three hours five days a week. Past admission physiatrist, therapy team and rehab RN have worked together to provide customized collaborative inpatient rehab.  She effervesced with  negative work up therefore Levaquin was discontinued. She has had problems with severe orthostatic changes as well as tachycardia therefore cardiology was contacted for input. They felt tachycardia likely pain mediated and Toprol was increased to 50 mg bid. Norvasc was discontinued with recommendations to let BP trend higher.  TEDs as well as abdominal binder were used to support blood pressure. CBC shows ABLA to be resolving and no transfusion needs indicated. Patient's symptoms had resolved with improvement in activity level and blood pressures were stable at discharge. She has made good gains and was modified independent at discharge. She will continue to receive [place], [place], bath aide and Ethan by [place] past discharge.    Rehab course: During patient's stay in rehab weekly team conferences were held to monitor patient's progress, set goals and discuss barriers to discharge. Patient has had improvement in activity tolerance, balance, postural control, as well as ability to compensate for deficits. She is independent with use of AE and is able to maintain hip precautions with ADL tasks. She is able to use leg loop to lift RLE on and off the bed.  She is able to ambulate 160 feet with RW in supervised setting at independent level and  requires supervision with stair and car transfers. She is independently able to direct family for care needed.    Disposition: 01-Home or Self Care  Diet: Regular.   Special Instructions: 1. Use abdominal binder when  out of bed. Loosen when in chair and remove when in bed. 2. Wear support stocking every morning and remove at bedtime. This is to help with blood pressure as well as control swelling in legs.      Medication List    STOP taking these medications       HYDROcodone-acetaminophen 5-325 MG per tablet  Commonly known as:  NORCO     levofloxacin 750 MG tablet  Commonly known as:  LEVAQUIN     lidocaine 5 %  Commonly known as:  LIDODERM       TAKE these medications       acetaminophen 325 MG tablet  Commonly known as:  TYLENOL  Take 1-2 tablets (325-650 mg total) by mouth every 6 (six) hours as needed for mild pain.     beclomethasone 40 MCG/ACT inhaler  Commonly known as:  QVAR  Inhale 2 puffs into the lungs 2 (two) times daily as needed (breathing, shortness of breath).     docusate sodium 100 MG capsule  Commonly known as:  COLACE  Take 1 capsule (100 mg total) by mouth 2 (two) times daily. Continue this while taking narcotics to help with bowel movements     iron polysaccharides 150 MG capsule  Commonly known as:  NIFEREX  Take 1 capsule (150 mg total) by mouth daily with breakfast.     metoprolol succinate 50 MG 24 hr tablet  Commonly known as:  TOPROL-XL  Take 1 tablet (50 mg total) by mouth 2 (two) times daily. Note increase in dose     nitroGLYCERIN 0.4 MG SL tablet  Commonly known as:  NITROSTAT  Place 1 tablet (0.4 mg total) under the tongue every 5 (five) minutes as needed for chest pain.     oxyCODONE 5 MG immediate release tablet--Rx # 30 pills   Commonly known as:  Oxy IR/ROXICODONE  Take 1 tablet (5 mg total) by mouth every 6 (six) hours as needed for severe pain.     polyethylene glycol packet  Commonly known as:  MIRALAX / GLYCOLAX  Take 17 g by mouth daily.     potassium chloride SA 20 MEQ tablet  Commonly known as:  K-DUR,KLOR-CON  Take 1 tablet (20 mEq total) by mouth daily.     pravastatin 40 MG tablet  Commonly known as:  PRAVACHOL  Take 1 tablet (40 mg total) by mouth daily. Once daily     traMADol 50 MG tablet  Commonly known as:  ULTRAM  Take 1 tablet (50 mg total) by mouth every 6 (six) hours. For pain. Taper to three times a day in a few days. Then further to twice a day. Then as needed.     TRAVATAN Z 0.004 % Soln ophthalmic solution  Generic drug:  Travoprost (BAK Free)  Place 1 drop into both eyes daily.     vitamin B-12 100 MCG tablet  Commonly known as:  CYANOCOBALAMIN   Take 100 mcg by mouth once a week.     Vitamin D3 1000 UNITS Caps  Take 2 capsules by mouth daily.     warfarin 3 MG tablet  Commonly known as:  COUMADIN  1 tablet daily except 1.5 tablets on Thursdays or as directed by coumadin clinic           Follow-up Information   Follow up with [name], MD On 09/10/2013. (@ 11:45 am  (arrive at 11:30 am))    Specialty:  Family Medicine   Contact information:  [address] [place] [address] [phone]       Follow up with Edmonia Lynch, D, MD. Call today. (for post op appointment)    Specialty:  Orthopedic Surgery   Contact information:   [address] [place] [address] [phone]       Call Sueanne Margarita, MD. (for follow up appointment and for any symptoms of dizziness or cardiac issues. )    Specialty:  Cardiology   Contact information:   [address] [place] [address] [phone]       Signed: Bary Leriche 09/02/2013, 2:50 PM

## 2013-09-03 DIAGNOSIS — D519 Vitamin B12 deficiency anemia, unspecified: Secondary | ICD-10-CM | POA: Diagnosis not present

## 2013-09-03 DIAGNOSIS — I255 Ischemic cardiomyopathy: Secondary | ICD-10-CM | POA: Diagnosis not present

## 2013-09-03 DIAGNOSIS — Z96642 Presence of left artificial hip joint: Secondary | ICD-10-CM | POA: Diagnosis not present

## 2013-09-03 DIAGNOSIS — S72002D Fracture of unspecified part of neck of left femur, subsequent encounter for closed fracture with routine healing: Secondary | ICD-10-CM | POA: Diagnosis not present

## 2013-09-03 DIAGNOSIS — I482 Chronic atrial fibrillation: Secondary | ICD-10-CM | POA: Diagnosis not present

## 2013-09-03 DIAGNOSIS — Z471 Aftercare following joint replacement surgery: Secondary | ICD-10-CM | POA: Diagnosis not present

## 2013-09-06 DIAGNOSIS — Z96642 Presence of left artificial hip joint: Secondary | ICD-10-CM | POA: Diagnosis not present

## 2013-09-06 DIAGNOSIS — Z471 Aftercare following joint replacement surgery: Secondary | ICD-10-CM | POA: Diagnosis not present

## 2013-09-06 DIAGNOSIS — S72002D Fracture of unspecified part of neck of left femur, subsequent encounter for closed fracture with routine healing: Secondary | ICD-10-CM | POA: Diagnosis not present

## 2013-09-06 DIAGNOSIS — I482 Chronic atrial fibrillation: Secondary | ICD-10-CM | POA: Diagnosis not present

## 2013-09-06 DIAGNOSIS — I255 Ischemic cardiomyopathy: Secondary | ICD-10-CM | POA: Diagnosis not present

## 2013-09-06 DIAGNOSIS — D519 Vitamin B12 deficiency anemia, unspecified: Secondary | ICD-10-CM | POA: Diagnosis not present

## 2013-09-07 DIAGNOSIS — I255 Ischemic cardiomyopathy: Secondary | ICD-10-CM | POA: Diagnosis not present

## 2013-09-07 DIAGNOSIS — D519 Vitamin B12 deficiency anemia, unspecified: Secondary | ICD-10-CM | POA: Diagnosis not present

## 2013-09-07 DIAGNOSIS — Z96642 Presence of left artificial hip joint: Secondary | ICD-10-CM | POA: Diagnosis not present

## 2013-09-07 DIAGNOSIS — S72002D Fracture of unspecified part of neck of left femur, subsequent encounter for closed fracture with routine healing: Secondary | ICD-10-CM | POA: Diagnosis not present

## 2013-09-07 DIAGNOSIS — Z471 Aftercare following joint replacement surgery: Secondary | ICD-10-CM | POA: Diagnosis not present

## 2013-09-07 DIAGNOSIS — I482 Chronic atrial fibrillation: Secondary | ICD-10-CM | POA: Diagnosis not present

## 2013-09-08 ENCOUNTER — Ambulatory Visit (INDEPENDENT_AMBULATORY_CARE_PROVIDER_SITE_OTHER): Payer: Medicare Other | Admitting: Pharmacist

## 2013-09-08 DIAGNOSIS — S72002D Fracture of unspecified part of neck of left femur, subsequent encounter for closed fracture with routine healing: Secondary | ICD-10-CM | POA: Diagnosis not present

## 2013-09-08 DIAGNOSIS — Z96642 Presence of left artificial hip joint: Secondary | ICD-10-CM | POA: Diagnosis not present

## 2013-09-08 DIAGNOSIS — Z4789 Encounter for other orthopedic aftercare: Secondary | ICD-10-CM | POA: Diagnosis not present

## 2013-09-08 DIAGNOSIS — I482 Chronic atrial fibrillation, unspecified: Secondary | ICD-10-CM

## 2013-09-08 DIAGNOSIS — D518 Other vitamin B12 deficiency anemias: Secondary | ICD-10-CM | POA: Diagnosis not present

## 2013-09-08 DIAGNOSIS — I2589 Other forms of chronic ischemic heart disease: Secondary | ICD-10-CM | POA: Diagnosis not present

## 2013-09-08 DIAGNOSIS — I4891 Unspecified atrial fibrillation: Secondary | ICD-10-CM

## 2013-09-08 DIAGNOSIS — I2510 Atherosclerotic heart disease of native coronary artery without angina pectoris: Secondary | ICD-10-CM | POA: Diagnosis not present

## 2013-09-08 DIAGNOSIS — Z471 Aftercare following joint replacement surgery: Secondary | ICD-10-CM

## 2013-09-08 DIAGNOSIS — D519 Vitamin B12 deficiency anemia, unspecified: Secondary | ICD-10-CM | POA: Diagnosis not present

## 2013-09-08 DIAGNOSIS — Z96649 Presence of unspecified artificial hip joint: Secondary | ICD-10-CM | POA: Diagnosis not present

## 2013-09-08 DIAGNOSIS — Z5181 Encounter for therapeutic drug level monitoring: Secondary | ICD-10-CM

## 2013-09-08 DIAGNOSIS — I255 Ischemic cardiomyopathy: Secondary | ICD-10-CM | POA: Diagnosis not present

## 2013-09-08 DIAGNOSIS — Z7901 Long term (current) use of anticoagulants: Secondary | ICD-10-CM | POA: Diagnosis not present

## 2013-09-08 DIAGNOSIS — S72009D Fracture of unspecified part of neck of unspecified femur, subsequent encounter for closed fracture with routine healing: Secondary | ICD-10-CM | POA: Diagnosis not present

## 2013-09-08 LAB — POCT INR: INR: 2.4

## 2013-09-09 DIAGNOSIS — D519 Vitamin B12 deficiency anemia, unspecified: Secondary | ICD-10-CM | POA: Diagnosis not present

## 2013-09-09 DIAGNOSIS — Z471 Aftercare following joint replacement surgery: Secondary | ICD-10-CM | POA: Diagnosis not present

## 2013-09-09 DIAGNOSIS — I482 Chronic atrial fibrillation: Secondary | ICD-10-CM | POA: Diagnosis not present

## 2013-09-09 DIAGNOSIS — Z96642 Presence of left artificial hip joint: Secondary | ICD-10-CM | POA: Diagnosis not present

## 2013-09-09 DIAGNOSIS — I255 Ischemic cardiomyopathy: Secondary | ICD-10-CM | POA: Diagnosis not present

## 2013-09-09 DIAGNOSIS — S72002D Fracture of unspecified part of neck of left femur, subsequent encounter for closed fracture with routine healing: Secondary | ICD-10-CM | POA: Diagnosis not present

## 2013-09-10 DIAGNOSIS — I4891 Unspecified atrial fibrillation: Secondary | ICD-10-CM | POA: Diagnosis not present

## 2013-09-10 DIAGNOSIS — I482 Chronic atrial fibrillation: Secondary | ICD-10-CM | POA: Diagnosis not present

## 2013-09-10 DIAGNOSIS — Z23 Encounter for immunization: Secondary | ICD-10-CM | POA: Diagnosis not present

## 2013-09-10 DIAGNOSIS — Z96642 Presence of left artificial hip joint: Secondary | ICD-10-CM | POA: Diagnosis not present

## 2013-09-10 DIAGNOSIS — I255 Ischemic cardiomyopathy: Secondary | ICD-10-CM | POA: Diagnosis not present

## 2013-09-10 DIAGNOSIS — S72002D Fracture of unspecified part of neck of left femur, subsequent encounter for closed fracture with routine healing: Secondary | ICD-10-CM | POA: Diagnosis not present

## 2013-09-10 DIAGNOSIS — Z471 Aftercare following joint replacement surgery: Secondary | ICD-10-CM | POA: Diagnosis not present

## 2013-09-10 DIAGNOSIS — D519 Vitamin B12 deficiency anemia, unspecified: Secondary | ICD-10-CM | POA: Diagnosis not present

## 2013-09-10 DIAGNOSIS — I119 Hypertensive heart disease without heart failure: Secondary | ICD-10-CM | POA: Diagnosis not present

## 2013-09-10 DIAGNOSIS — S72009A Fracture of unspecified part of neck of unspecified femur, initial encounter for closed fracture: Secondary | ICD-10-CM | POA: Diagnosis not present

## 2013-09-14 DIAGNOSIS — I255 Ischemic cardiomyopathy: Secondary | ICD-10-CM | POA: Diagnosis not present

## 2013-09-14 DIAGNOSIS — D519 Vitamin B12 deficiency anemia, unspecified: Secondary | ICD-10-CM | POA: Diagnosis not present

## 2013-09-14 DIAGNOSIS — Z96642 Presence of left artificial hip joint: Secondary | ICD-10-CM | POA: Diagnosis not present

## 2013-09-14 DIAGNOSIS — Z471 Aftercare following joint replacement surgery: Secondary | ICD-10-CM | POA: Diagnosis not present

## 2013-09-14 DIAGNOSIS — S72002D Fracture of unspecified part of neck of left femur, subsequent encounter for closed fracture with routine healing: Secondary | ICD-10-CM | POA: Diagnosis not present

## 2013-09-14 DIAGNOSIS — I482 Chronic atrial fibrillation: Secondary | ICD-10-CM | POA: Diagnosis not present

## 2013-09-15 ENCOUNTER — Telehealth: Payer: Self-pay

## 2013-09-15 ENCOUNTER — Ambulatory Visit (INDEPENDENT_AMBULATORY_CARE_PROVIDER_SITE_OTHER): Payer: Medicare Other | Admitting: Interventional Cardiology

## 2013-09-15 DIAGNOSIS — I4891 Unspecified atrial fibrillation: Secondary | ICD-10-CM

## 2013-09-15 DIAGNOSIS — I482 Chronic atrial fibrillation, unspecified: Secondary | ICD-10-CM

## 2013-09-15 DIAGNOSIS — Z96642 Presence of left artificial hip joint: Secondary | ICD-10-CM | POA: Diagnosis not present

## 2013-09-15 DIAGNOSIS — Z471 Aftercare following joint replacement surgery: Secondary | ICD-10-CM | POA: Diagnosis not present

## 2013-09-15 DIAGNOSIS — Z5181 Encounter for therapeutic drug level monitoring: Secondary | ICD-10-CM

## 2013-09-15 DIAGNOSIS — I255 Ischemic cardiomyopathy: Secondary | ICD-10-CM | POA: Diagnosis not present

## 2013-09-15 DIAGNOSIS — D519 Vitamin B12 deficiency anemia, unspecified: Secondary | ICD-10-CM | POA: Diagnosis not present

## 2013-09-15 DIAGNOSIS — S72002D Fracture of unspecified part of neck of left femur, subsequent encounter for closed fracture with routine healing: Secondary | ICD-10-CM | POA: Diagnosis not present

## 2013-09-15 LAB — POCT INR: INR: 2.4

## 2013-09-15 NOTE — Telephone Encounter (Signed)
[facility]ie with [facility] called to get verbal to continue care for patient.  Verbal given.  Advised if any further needed patient needs to be seen.

## 2013-09-16 DIAGNOSIS — S72002D Fracture of unspecified part of neck of left femur, subsequent encounter for closed fracture with routine healing: Secondary | ICD-10-CM | POA: Diagnosis not present

## 2013-09-16 DIAGNOSIS — Z471 Aftercare following joint replacement surgery: Secondary | ICD-10-CM | POA: Diagnosis not present

## 2013-09-16 DIAGNOSIS — I255 Ischemic cardiomyopathy: Secondary | ICD-10-CM | POA: Diagnosis not present

## 2013-09-16 DIAGNOSIS — I482 Chronic atrial fibrillation: Secondary | ICD-10-CM | POA: Diagnosis not present

## 2013-09-16 DIAGNOSIS — Z96642 Presence of left artificial hip joint: Secondary | ICD-10-CM | POA: Diagnosis not present

## 2013-09-16 DIAGNOSIS — D519 Vitamin B12 deficiency anemia, unspecified: Secondary | ICD-10-CM | POA: Diagnosis not present

## 2013-09-17 DIAGNOSIS — Z96642 Presence of left artificial hip joint: Secondary | ICD-10-CM | POA: Diagnosis not present

## 2013-09-17 DIAGNOSIS — I482 Chronic atrial fibrillation: Secondary | ICD-10-CM | POA: Diagnosis not present

## 2013-09-17 DIAGNOSIS — I255 Ischemic cardiomyopathy: Secondary | ICD-10-CM | POA: Diagnosis not present

## 2013-09-17 DIAGNOSIS — S72002D Fracture of unspecified part of neck of left femur, subsequent encounter for closed fracture with routine healing: Secondary | ICD-10-CM | POA: Diagnosis not present

## 2013-09-17 DIAGNOSIS — Z471 Aftercare following joint replacement surgery: Secondary | ICD-10-CM | POA: Diagnosis not present

## 2013-09-17 DIAGNOSIS — D519 Vitamin B12 deficiency anemia, unspecified: Secondary | ICD-10-CM | POA: Diagnosis not present

## 2013-09-20 DIAGNOSIS — I482 Chronic atrial fibrillation: Secondary | ICD-10-CM | POA: Diagnosis not present

## 2013-09-20 DIAGNOSIS — I255 Ischemic cardiomyopathy: Secondary | ICD-10-CM | POA: Diagnosis not present

## 2013-09-20 DIAGNOSIS — Z96642 Presence of left artificial hip joint: Secondary | ICD-10-CM | POA: Diagnosis not present

## 2013-09-20 DIAGNOSIS — S72002D Fracture of unspecified part of neck of left femur, subsequent encounter for closed fracture with routine healing: Secondary | ICD-10-CM | POA: Diagnosis not present

## 2013-09-20 DIAGNOSIS — Z471 Aftercare following joint replacement surgery: Secondary | ICD-10-CM | POA: Diagnosis not present

## 2013-09-20 DIAGNOSIS — D519 Vitamin B12 deficiency anemia, unspecified: Secondary | ICD-10-CM | POA: Diagnosis not present

## 2013-09-21 DIAGNOSIS — Z96642 Presence of left artificial hip joint: Secondary | ICD-10-CM | POA: Diagnosis not present

## 2013-09-21 DIAGNOSIS — Z471 Aftercare following joint replacement surgery: Secondary | ICD-10-CM | POA: Diagnosis not present

## 2013-09-21 DIAGNOSIS — I482 Chronic atrial fibrillation: Secondary | ICD-10-CM | POA: Diagnosis not present

## 2013-09-21 DIAGNOSIS — S72002D Fracture of unspecified part of neck of left femur, subsequent encounter for closed fracture with routine healing: Secondary | ICD-10-CM | POA: Diagnosis not present

## 2013-09-21 DIAGNOSIS — I255 Ischemic cardiomyopathy: Secondary | ICD-10-CM | POA: Diagnosis not present

## 2013-09-21 DIAGNOSIS — D519 Vitamin B12 deficiency anemia, unspecified: Secondary | ICD-10-CM | POA: Diagnosis not present

## 2013-09-22 DIAGNOSIS — I482 Chronic atrial fibrillation: Secondary | ICD-10-CM | POA: Diagnosis not present

## 2013-09-22 DIAGNOSIS — D519 Vitamin B12 deficiency anemia, unspecified: Secondary | ICD-10-CM | POA: Diagnosis not present

## 2013-09-22 DIAGNOSIS — Z471 Aftercare following joint replacement surgery: Secondary | ICD-10-CM | POA: Diagnosis not present

## 2013-09-22 DIAGNOSIS — Z96642 Presence of left artificial hip joint: Secondary | ICD-10-CM | POA: Diagnosis not present

## 2013-09-22 DIAGNOSIS — S72002D Fracture of unspecified part of neck of left femur, subsequent encounter for closed fracture with routine healing: Secondary | ICD-10-CM | POA: Diagnosis not present

## 2013-09-22 DIAGNOSIS — I255 Ischemic cardiomyopathy: Secondary | ICD-10-CM | POA: Diagnosis not present

## 2013-09-23 ENCOUNTER — Ambulatory Visit: Payer: Medicare Other | Admitting: Cardiology

## 2013-09-23 DIAGNOSIS — D519 Vitamin B12 deficiency anemia, unspecified: Secondary | ICD-10-CM | POA: Diagnosis not present

## 2013-09-23 DIAGNOSIS — I482 Chronic atrial fibrillation: Secondary | ICD-10-CM | POA: Diagnosis not present

## 2013-09-23 DIAGNOSIS — Z471 Aftercare following joint replacement surgery: Secondary | ICD-10-CM | POA: Diagnosis not present

## 2013-09-23 DIAGNOSIS — I255 Ischemic cardiomyopathy: Secondary | ICD-10-CM | POA: Diagnosis not present

## 2013-09-23 DIAGNOSIS — Z96642 Presence of left artificial hip joint: Secondary | ICD-10-CM | POA: Diagnosis not present

## 2013-09-23 DIAGNOSIS — S72002D Fracture of unspecified part of neck of left femur, subsequent encounter for closed fracture with routine healing: Secondary | ICD-10-CM | POA: Diagnosis not present

## 2013-09-28 DIAGNOSIS — Z96642 Presence of left artificial hip joint: Secondary | ICD-10-CM | POA: Diagnosis not present

## 2013-09-28 DIAGNOSIS — I482 Chronic atrial fibrillation: Secondary | ICD-10-CM | POA: Diagnosis not present

## 2013-09-28 DIAGNOSIS — D519 Vitamin B12 deficiency anemia, unspecified: Secondary | ICD-10-CM | POA: Diagnosis not present

## 2013-09-28 DIAGNOSIS — S72002D Fracture of unspecified part of neck of left femur, subsequent encounter for closed fracture with routine healing: Secondary | ICD-10-CM | POA: Diagnosis not present

## 2013-09-28 DIAGNOSIS — I255 Ischemic cardiomyopathy: Secondary | ICD-10-CM | POA: Diagnosis not present

## 2013-09-28 DIAGNOSIS — Z471 Aftercare following joint replacement surgery: Secondary | ICD-10-CM | POA: Diagnosis not present

## 2013-09-29 ENCOUNTER — Ambulatory Visit (INDEPENDENT_AMBULATORY_CARE_PROVIDER_SITE_OTHER): Payer: Medicare Other | Admitting: Interventional Cardiology

## 2013-09-29 DIAGNOSIS — S72002D Fracture of unspecified part of neck of left femur, subsequent encounter for closed fracture with routine healing: Secondary | ICD-10-CM | POA: Diagnosis not present

## 2013-09-29 DIAGNOSIS — Z471 Aftercare following joint replacement surgery: Secondary | ICD-10-CM | POA: Diagnosis not present

## 2013-09-29 DIAGNOSIS — I4891 Unspecified atrial fibrillation: Secondary | ICD-10-CM

## 2013-09-29 DIAGNOSIS — Z96642 Presence of left artificial hip joint: Secondary | ICD-10-CM | POA: Diagnosis not present

## 2013-09-29 DIAGNOSIS — Z5181 Encounter for therapeutic drug level monitoring: Secondary | ICD-10-CM

## 2013-09-29 DIAGNOSIS — I482 Chronic atrial fibrillation, unspecified: Secondary | ICD-10-CM

## 2013-09-29 DIAGNOSIS — I255 Ischemic cardiomyopathy: Secondary | ICD-10-CM | POA: Diagnosis not present

## 2013-09-29 DIAGNOSIS — D519 Vitamin B12 deficiency anemia, unspecified: Secondary | ICD-10-CM | POA: Diagnosis not present

## 2013-09-29 LAB — POCT INR: INR: 3.3

## 2013-09-30 DIAGNOSIS — I255 Ischemic cardiomyopathy: Secondary | ICD-10-CM | POA: Diagnosis not present

## 2013-09-30 DIAGNOSIS — S72002D Fracture of unspecified part of neck of left femur, subsequent encounter for closed fracture with routine healing: Secondary | ICD-10-CM | POA: Diagnosis not present

## 2013-09-30 DIAGNOSIS — D519 Vitamin B12 deficiency anemia, unspecified: Secondary | ICD-10-CM | POA: Diagnosis not present

## 2013-09-30 DIAGNOSIS — I482 Chronic atrial fibrillation: Secondary | ICD-10-CM | POA: Diagnosis not present

## 2013-09-30 DIAGNOSIS — Z96642 Presence of left artificial hip joint: Secondary | ICD-10-CM | POA: Diagnosis not present

## 2013-09-30 DIAGNOSIS — Z471 Aftercare following joint replacement surgery: Secondary | ICD-10-CM | POA: Diagnosis not present

## 2013-10-05 DIAGNOSIS — S72002D Fracture of unspecified part of neck of left femur, subsequent encounter for closed fracture with routine healing: Secondary | ICD-10-CM | POA: Diagnosis not present

## 2013-10-05 DIAGNOSIS — I482 Chronic atrial fibrillation: Secondary | ICD-10-CM | POA: Diagnosis not present

## 2013-10-05 DIAGNOSIS — Z471 Aftercare following joint replacement surgery: Secondary | ICD-10-CM | POA: Diagnosis not present

## 2013-10-05 DIAGNOSIS — Z96642 Presence of left artificial hip joint: Secondary | ICD-10-CM | POA: Diagnosis not present

## 2013-10-05 DIAGNOSIS — I255 Ischemic cardiomyopathy: Secondary | ICD-10-CM | POA: Diagnosis not present

## 2013-10-05 DIAGNOSIS — D519 Vitamin B12 deficiency anemia, unspecified: Secondary | ICD-10-CM | POA: Diagnosis not present

## 2013-10-07 ENCOUNTER — Encounter: Payer: Self-pay | Admitting: Cardiology

## 2013-10-12 ENCOUNTER — Encounter: Payer: Self-pay | Admitting: Cardiology

## 2013-10-12 ENCOUNTER — Ambulatory Visit (INDEPENDENT_AMBULATORY_CARE_PROVIDER_SITE_OTHER): Payer: Medicare Other | Admitting: Cardiology

## 2013-10-12 VITALS — BP 116/70 | HR 106 | Ht 62.0 in | Wt 132.0 lb

## 2013-10-12 DIAGNOSIS — I951 Orthostatic hypotension: Secondary | ICD-10-CM

## 2013-10-12 DIAGNOSIS — I482 Chronic atrial fibrillation, unspecified: Secondary | ICD-10-CM

## 2013-10-12 DIAGNOSIS — Z950 Presence of cardiac pacemaker: Secondary | ICD-10-CM | POA: Diagnosis not present

## 2013-10-12 DIAGNOSIS — I493 Ventricular premature depolarization: Secondary | ICD-10-CM | POA: Diagnosis not present

## 2013-10-12 DIAGNOSIS — R0602 Shortness of breath: Secondary | ICD-10-CM | POA: Diagnosis not present

## 2013-10-12 DIAGNOSIS — I495 Sick sinus syndrome: Secondary | ICD-10-CM

## 2013-10-12 DIAGNOSIS — G4733 Obstructive sleep apnea (adult) (pediatric): Secondary | ICD-10-CM | POA: Insufficient documentation

## 2013-10-12 NOTE — Patient Instructions (Signed)
Your physician recommends that you continue on your current medications as directed. Please refer to the Current Medication list given to you today.  Lab today: Bnp  We will get another sleep download in 4 weeks  Your physician wants you to follow-up in: 6 monthsl receive a reminder letter in the mail two months in advance. If you don't receive a letter, please call our office to schedule the follow-up appointment.

## 2013-10-12 NOTE — Progress Notes (Signed)
[address], [address], [place]  [address] Phone: [phone] Fax:  [phone]  Date:  10/12/2013   ID:  Anna Farrell, DOB 1927/10/09, MRN [id number]  PCP:  [name], MD  Cardiologist:  Fransico Him, MD    History of Present Illness: Anna Farrell is a 80 y.o. female with a history of chronic atrial fibrillation, orthostatic hypotension, PPM and asymptomatic PVC's who presents today for followup. She is doing well. She denies any LE edema, palpitations. She was noted on a prior hospitalization to have episodes of apnea and underwent nocturnal PSG showing severe OSA with AHI 25/hr.  She underwent titration and was titrated on BiPAP to 10/6cm h2O.  She now presents for followup.  She is doing well with her device.  She tolerates the nasal pillow mask and feels the pressure is adequate.  Since I saw her last she fell and broke her hip and had surgical repair.  She denies any chest pain, LE edema, dizziness, palpitations or syncope.  She has some SOB when she gets up in the am but that goes away after her inhaler.    Wt Readings from Last 3 Encounters:  10/12/13 132 lb (59.875 kg)  08/19/13 146 lb 6.2 oz (66.4 kg)  08/15/13 134 lb 1.6 oz (60.827 kg)     Past Medical History  Diagnosis Date  . Vasovagal syncope   . Hypertension   . Hyperlipidemia   . Orthostatic hypotension   . Osteoporosis   . Pacemaker 10/11/06    implanted by Dr Leonia Reeves for pauses > 4 seconds with afib  . Iron deficiency anemia   . Colitis 5/13  . Chronic low back pain 2008    crushed vertebrae  . Tachycardia-bradycardia syndrome   . Permanent atrial fibrillation   . Asymptomatic PVCs   . History of colonic polyps   . Extrinsic asthma, unspecified     LOV Dr Joya Gaskins 7/12  . Long term (current) use of anticoagulants   . Cholelithiasis   . Generalized osteoarthrosis, unspecified site   . Pancreatitis     Current Outpatient Prescriptions  Medication Sig Dispense Refill  . acetaminophen  (TYLENOL) 325 MG tablet Take 1-2 tablets (325-650 mg total) by mouth every 6 (six) hours as needed for mild pain.      . beclomethasone (QVAR) 40 MCG/ACT inhaler Inhale 2 puffs into the lungs 2 (two) times daily as needed (breathing, shortness of breath).      . Cholecalciferol (VITAMIN D3) 1000 UNITS CAPS Take 2 capsules by mouth daily.       Marland Kitchen docusate sodium (COLACE) 100 MG capsule Take 1 capsule (100 mg total) by mouth 2 (two) times daily. Continue this while taking narcotics to help with bowel movements  30 capsule  1  . iron polysaccharides (NIFEREX) 150 MG capsule Take 1 capsule (150 mg total) by mouth daily with breakfast.  30 capsule  1  . metoprolol succinate (TOPROL-XL) 50 MG 24 hr tablet Take 1 tablet (50 mg total) by mouth 2 (two) times daily. Note increase in dose  60 tablet  1  . nitroGLYCERIN (NITROSTAT) 0.4 MG SL tablet Place 1 tablet (0.4 mg total) under the tongue every 5 (five) minutes as needed for chest pain.  90 tablet  3  . oxyCODONE (OXY IR/ROXICODONE) 5 MG immediate release tablet Take 1 tablet (5 mg total) by mouth every 6 (six) hours as needed for severe pain.  30 tablet  0  . polyethylene glycol (  MIRALAX / GLYCOLAX) packet Take 17 g by mouth daily.  14 each  0  . potassium chloride SA (K-DUR,KLOR-CON) 20 MEQ tablet Take 1 tablet (20 mEq total) by mouth daily.  30 tablet  0  . pravastatin (PRAVACHOL) 40 MG tablet Take 1 tablet (40 mg total) by mouth daily. Once daily  90 tablet  1  . traMADol (ULTRAM) 50 MG tablet Take 1 tablet (50 mg total) by mouth every 6 (six) hours. For pain. Taper to three times a day in a few days. Then further to twice a day. Then as needed.  90 tablet  0  . TRAVATAN Z 0.004 % SOLN ophthalmic solution Place 1 drop into both eyes daily.      . vitamin B-12 (CYANOCOBALAMIN) 100 MCG tablet Take 100 mcg by mouth once a week.      . warfarin (COUMADIN) 3 MG tablet 1 tablet daily except 1.5 tablets on Thursdays or as directed by coumadin clinic  105 tablet   1   No current facility-administered medications for this visit.    Allergies:    Allergies  Allergen Reactions  . Betadine [Povidone Iodine]     burning  . Codeine Nausea And Vomiting    Passed out    Social History:  The patient  reports that she has never smoked. She has never used smokeless tobacco. She reports that she does not drink alcohol or use illicit drugs.   Family History:  The patient's family history includes Hypertension in her mother.   ROS:  Please see the history of present illness.      All other systems reviewed and negative.   PHYSICAL EXAM: VS:  BP 116/70  Pulse 111  Ht 5\' 2"  (1.575 m)  Wt 132 lb (59.875 kg)  BMI 24.14 kg/m2 Well nourished, well developed, in no acute distress HEENT: normal Neck: no JVD Cardiac:  normal S1, S2; irregularly irregular; no murmur Lungs:  clear to auscultation bilaterally, no wheezing, rhonchi or rales Abd: soft, nontender, no hepatomegaly Ext: no edema Skin: warm and dry Neuro:  CNs 2-12 intact, no focal abnormalities noted    ASSESSMENT AND PLAN:  1. Severe OSA on BiPAP at 10/6cm H2O and tolerating well but last download showed poor compliance and increased AHI at 26/hr.  She does not sleep on her back and uses a chin strap.  She has a lot of problems with RLS.  She gets up a lot at night to go to the bathroom and is not sure if she is turning the machine off when she takes the mask off.  She will make a point of doing this for the next month and then we will check a d/l in 4 weeks 2. Chronic atrial fibrillation - on coumadin.  Rate controlled - continue warfarin/Toprol 3. Orthostatic hypotension - resolved 4. PVC's - asymptomatic 5. S/P PPM for SSS 6. Chronic SOB improve with inhalers - check BNP today.  She appears euvolemic on exam  Followup with me in 6 months  Signed, Fransico Him, MD [facility] 10/12/2013 3:22 PM

## 2013-10-13 LAB — BRAIN NATRIURETIC PEPTIDE: PRO B NATRI PEPTIDE: 127 pg/mL — AB (ref 0.0–100.0)

## 2013-10-14 ENCOUNTER — Ambulatory Visit (INDEPENDENT_AMBULATORY_CARE_PROVIDER_SITE_OTHER): Payer: Medicare Other | Admitting: Internal Medicine

## 2013-10-14 ENCOUNTER — Telehealth: Payer: Self-pay

## 2013-10-14 DIAGNOSIS — S72002D Fracture of unspecified part of neck of left femur, subsequent encounter for closed fracture with routine healing: Secondary | ICD-10-CM | POA: Diagnosis not present

## 2013-10-14 DIAGNOSIS — I482 Chronic atrial fibrillation, unspecified: Secondary | ICD-10-CM

## 2013-10-14 DIAGNOSIS — D519 Vitamin B12 deficiency anemia, unspecified: Secondary | ICD-10-CM | POA: Diagnosis not present

## 2013-10-14 DIAGNOSIS — Z5181 Encounter for therapeutic drug level monitoring: Secondary | ICD-10-CM

## 2013-10-14 DIAGNOSIS — Z471 Aftercare following joint replacement surgery: Secondary | ICD-10-CM | POA: Diagnosis not present

## 2013-10-14 DIAGNOSIS — Z79899 Other long term (current) drug therapy: Secondary | ICD-10-CM

## 2013-10-14 DIAGNOSIS — Z96642 Presence of left artificial hip joint: Secondary | ICD-10-CM | POA: Diagnosis not present

## 2013-10-14 DIAGNOSIS — I255 Ischemic cardiomyopathy: Secondary | ICD-10-CM | POA: Diagnosis not present

## 2013-10-14 LAB — POCT INR: INR: 1.9

## 2013-10-14 NOTE — Telephone Encounter (Signed)
Patient was given potassium in the hospital, but is currently not on any. Needs to know what to do

## 2013-10-14 NOTE — Telephone Encounter (Signed)
Message copied by Brett Canales on Thu Oct 14, 2013  9:29 AM ------      Message from: Fransico Him R      Created: Thu Oct 14, 2013  8:45 AM       BNP essentially normal.  Please find out if patient is still on potassium and if so find out who put her on it ------

## 2013-10-14 NOTE — Telephone Encounter (Signed)
Please see fi they can run a BMET on her labs from Tuesday

## 2013-10-14 NOTE — Telephone Encounter (Signed)
Pt thinks her last dose of potassium was about 2 weeks ago.

## 2013-10-14 NOTE — Telephone Encounter (Signed)
When was her last dose of potassium

## 2013-10-14 NOTE — Telephone Encounter (Signed)
Bmet can not be added to BNP. Do you want pt to come in for BMet?

## 2013-10-14 NOTE — Telephone Encounter (Signed)
yes

## 2013-10-15 ENCOUNTER — Encounter: Payer: Self-pay | Admitting: Cardiology

## 2013-10-15 NOTE — Telephone Encounter (Signed)
Pt notified. Pt will try and come on Monday 10/18/13 for Bmet.

## 2013-10-15 NOTE — Telephone Encounter (Signed)
No I want to make sure her potassium is ok

## 2013-10-15 NOTE — Telephone Encounter (Signed)
Pt is still having problems getting around and she has a coumadin clinic appointment on 10/22. Can she wait to have labs drawn until then?

## 2013-10-18 ENCOUNTER — Other Ambulatory Visit (INDEPENDENT_AMBULATORY_CARE_PROVIDER_SITE_OTHER): Payer: Medicare Other | Admitting: *Deleted

## 2013-10-18 DIAGNOSIS — Z79899 Other long term (current) drug therapy: Secondary | ICD-10-CM | POA: Diagnosis not present

## 2013-10-18 LAB — BASIC METABOLIC PANEL
BUN: 15 mg/dL (ref 6–23)
CO2: 29 mEq/L (ref 19–32)
Calcium: 9.4 mg/dL (ref 8.4–10.5)
Chloride: 102 mEq/L (ref 96–112)
Creatinine, Ser: 0.8 mg/dL (ref 0.4–1.2)
GFR: 74.8 mL/min (ref >60.00)
Glucose, Bld: 97 mg/dL (ref 70–99)
Potassium: 3.8 mEq/L (ref 3.5–5.1)
Sodium: 137 mEq/L (ref 135–145)

## 2013-10-21 DIAGNOSIS — I482 Chronic atrial fibrillation: Secondary | ICD-10-CM | POA: Diagnosis not present

## 2013-10-21 DIAGNOSIS — D519 Vitamin B12 deficiency anemia, unspecified: Secondary | ICD-10-CM | POA: Diagnosis not present

## 2013-10-21 DIAGNOSIS — Z96642 Presence of left artificial hip joint: Secondary | ICD-10-CM | POA: Diagnosis not present

## 2013-10-21 DIAGNOSIS — Z471 Aftercare following joint replacement surgery: Secondary | ICD-10-CM | POA: Diagnosis not present

## 2013-10-21 DIAGNOSIS — I255 Ischemic cardiomyopathy: Secondary | ICD-10-CM | POA: Diagnosis not present

## 2013-10-21 DIAGNOSIS — S72002D Fracture of unspecified part of neck of left femur, subsequent encounter for closed fracture with routine healing: Secondary | ICD-10-CM | POA: Diagnosis not present

## 2013-10-26 DIAGNOSIS — M810 Age-related osteoporosis without current pathological fracture: Secondary | ICD-10-CM | POA: Diagnosis not present

## 2013-10-28 ENCOUNTER — Ambulatory Visit (INDEPENDENT_AMBULATORY_CARE_PROVIDER_SITE_OTHER): Payer: Medicare Other | Admitting: Pharmacist Clinician (PhC)/ Clinical Pharmacy Specialist

## 2013-10-28 DIAGNOSIS — Z5181 Encounter for therapeutic drug level monitoring: Secondary | ICD-10-CM

## 2013-10-28 DIAGNOSIS — I482 Chronic atrial fibrillation, unspecified: Secondary | ICD-10-CM

## 2013-10-28 LAB — POCT INR: INR: 2.1

## 2013-11-18 ENCOUNTER — Encounter: Payer: Self-pay | Admitting: Cardiology

## 2013-11-18 ENCOUNTER — Ambulatory Visit (INDEPENDENT_AMBULATORY_CARE_PROVIDER_SITE_OTHER): Payer: Medicare Other | Admitting: Pharmacist

## 2013-11-18 DIAGNOSIS — I482 Chronic atrial fibrillation, unspecified: Secondary | ICD-10-CM

## 2013-11-18 DIAGNOSIS — Z5181 Encounter for therapeutic drug level monitoring: Secondary | ICD-10-CM

## 2013-11-18 LAB — POCT INR: INR: 1.9

## 2013-11-24 DIAGNOSIS — I119 Hypertensive heart disease without heart failure: Secondary | ICD-10-CM | POA: Diagnosis not present

## 2013-11-24 DIAGNOSIS — R51 Headache: Secondary | ICD-10-CM | POA: Diagnosis not present

## 2013-12-09 ENCOUNTER — Ambulatory Visit (INDEPENDENT_AMBULATORY_CARE_PROVIDER_SITE_OTHER): Payer: Medicare Other | Admitting: *Deleted

## 2013-12-09 DIAGNOSIS — Z5181 Encounter for therapeutic drug level monitoring: Secondary | ICD-10-CM | POA: Diagnosis not present

## 2013-12-09 DIAGNOSIS — I482 Chronic atrial fibrillation, unspecified: Secondary | ICD-10-CM

## 2013-12-09 LAB — POCT INR: INR: 2.2

## 2014-01-05 ENCOUNTER — Other Ambulatory Visit: Payer: Self-pay | Admitting: *Deleted

## 2014-01-05 MED ORDER — WARFARIN SODIUM 3 MG PO TABS: ORAL_TABLET | ORAL | Status: DC

## 2014-01-06 ENCOUNTER — Ambulatory Visit (INDEPENDENT_AMBULATORY_CARE_PROVIDER_SITE_OTHER): Payer: Medicare Other | Admitting: Pharmacist

## 2014-01-06 ENCOUNTER — Other Ambulatory Visit: Payer: Self-pay | Admitting: Pharmacist

## 2014-01-06 DIAGNOSIS — I482 Chronic atrial fibrillation, unspecified: Secondary | ICD-10-CM

## 2014-01-06 DIAGNOSIS — Z5181 Encounter for therapeutic drug level monitoring: Secondary | ICD-10-CM | POA: Diagnosis not present

## 2014-01-06 LAB — POCT INR: INR: 2.8

## 2014-01-06 MED ORDER — WARFARIN SODIUM 3 MG PO TABS: ORAL_TABLET | ORAL | Status: DC

## 2014-01-10 ENCOUNTER — Telehealth: Payer: Self-pay | Admitting: Cardiology

## 2014-01-10 NOTE — Telephone Encounter (Signed)
Walk in pt form " Disability pla-card" dropped off [place] back Tuesday 1/5 will give to her then.

## 2014-01-18 DIAGNOSIS — H40023 Open angle with borderline findings, high risk, bilateral: Secondary | ICD-10-CM | POA: Diagnosis not present

## 2014-01-18 DIAGNOSIS — H2513 Age-related nuclear cataract, bilateral: Secondary | ICD-10-CM | POA: Diagnosis not present

## 2014-01-26 ENCOUNTER — Encounter: Payer: Self-pay | Admitting: Cardiology

## 2014-01-30 ENCOUNTER — Inpatient Hospital Stay (HOSPITAL_COMMUNITY)
Admission: EM | Admit: 2014-01-30 | Discharge: 2014-02-01 | DRG: 690 | Disposition: A | Discharge status: Home / Self Care | Payer: Medicare Other | Attending: Internal Medicine | Admitting: Internal Medicine

## 2014-01-30 ENCOUNTER — Encounter (HOSPITAL_COMMUNITY): Payer: Self-pay | Admitting: *Deleted

## 2014-01-30 ENCOUNTER — Emergency Department (HOSPITAL_COMMUNITY): Payer: Medicare Other

## 2014-01-30 DIAGNOSIS — Z9049 Acquired absence of other specified parts of digestive tract: Secondary | ICD-10-CM | POA: Diagnosis present

## 2014-01-30 DIAGNOSIS — K921 Melena: Secondary | ICD-10-CM | POA: Diagnosis not present

## 2014-01-30 DIAGNOSIS — G4733 Obstructive sleep apnea (adult) (pediatric): Secondary | ICD-10-CM | POA: Diagnosis present

## 2014-01-30 DIAGNOSIS — R9431 Abnormal electrocardiogram [ECG] [EKG]: Secondary | ICD-10-CM | POA: Diagnosis not present

## 2014-01-30 DIAGNOSIS — I10 Essential (primary) hypertension: Secondary | ICD-10-CM | POA: Diagnosis present

## 2014-01-30 DIAGNOSIS — Z86010 Personal history of colonic polyps: Secondary | ICD-10-CM | POA: Diagnosis not present

## 2014-01-30 DIAGNOSIS — M810 Age-related osteoporosis without current pathological fracture: Secondary | ICD-10-CM | POA: Diagnosis present

## 2014-01-30 DIAGNOSIS — R1030 Lower abdominal pain, unspecified: Secondary | ICD-10-CM

## 2014-01-30 DIAGNOSIS — Z90710 Acquired absence of both cervix and uterus: Secondary | ICD-10-CM | POA: Diagnosis not present

## 2014-01-30 DIAGNOSIS — Z7901 Long term (current) use of anticoagulants: Secondary | ICD-10-CM

## 2014-01-30 DIAGNOSIS — I482 Chronic atrial fibrillation, unspecified: Secondary | ICD-10-CM | POA: Diagnosis present

## 2014-01-30 DIAGNOSIS — Z950 Presence of cardiac pacemaker: Secondary | ICD-10-CM | POA: Diagnosis not present

## 2014-01-30 DIAGNOSIS — R195 Other fecal abnormalities: Secondary | ICD-10-CM

## 2014-01-30 DIAGNOSIS — E860 Dehydration: Secondary | ICD-10-CM | POA: Diagnosis present

## 2014-01-30 DIAGNOSIS — E785 Hyperlipidemia, unspecified: Secondary | ICD-10-CM | POA: Diagnosis present

## 2014-01-30 DIAGNOSIS — R1110 Vomiting, unspecified: Secondary | ICD-10-CM | POA: Diagnosis present

## 2014-01-30 DIAGNOSIS — R112 Nausea with vomiting, unspecified: Secondary | ICD-10-CM

## 2014-01-30 DIAGNOSIS — E876 Hypokalemia: Secondary | ICD-10-CM | POA: Diagnosis present

## 2014-01-30 DIAGNOSIS — N390 Urinary tract infection, site not specified: Secondary | ICD-10-CM | POA: Diagnosis not present

## 2014-01-30 DIAGNOSIS — B9689 Other specified bacterial agents as the cause of diseases classified elsewhere: Secondary | ICD-10-CM | POA: Diagnosis not present

## 2014-01-30 DIAGNOSIS — R197 Diarrhea, unspecified: Secondary | ICD-10-CM

## 2014-01-30 DIAGNOSIS — Z79899 Other long term (current) drug therapy: Secondary | ICD-10-CM | POA: Diagnosis not present

## 2014-01-30 DIAGNOSIS — R109 Unspecified abdominal pain: Secondary | ICD-10-CM | POA: Diagnosis not present

## 2014-01-30 LAB — URINALYSIS, ROUTINE W REFLEX MICROSCOPIC
Bilirubin Urine: NEGATIVE
Glucose, UA: NEGATIVE mg/dL
HGB URINE DIPSTICK: NEGATIVE
Ketones, ur: NEGATIVE mg/dL
LEUKOCYTES UA: NEGATIVE
Nitrite: POSITIVE — AB
PROTEIN: 30 mg/dL — AB
Specific Gravity, Urine: 1.013 (ref 1.005–1.030)
Urobilinogen, UA: 1 mg/dL (ref 0.0–1.0)
pH: 7 (ref 5.0–8.0)

## 2014-01-30 LAB — I-STAT CG4 LACTIC ACID, ED
LACTIC ACID, VENOUS: 2.31 mmol/L — AB (ref 0.5–2.0)
Lactic Acid, Venous: 3.2 mmol/L (ref 0.5–2.0)

## 2014-01-30 LAB — POC OCCULT BLOOD, ED: Fecal Occult Bld: POSITIVE — AB

## 2014-01-30 LAB — COMPREHENSIVE METABOLIC PANEL
ALK PHOS: 126 U/L — AB (ref 39–117)
ALT: 16 U/L (ref 0–35)
AST: 25 U/L (ref 0–37)
Albumin: 3.8 g/dL (ref 3.5–5.2)
Anion gap: 9 (ref 5–15)
BUN: 14 mg/dL (ref 6–23)
CALCIUM: 9.5 mg/dL (ref 8.4–10.5)
CHLORIDE: 101 mmol/L (ref 96–112)
CO2: 26 mmol/L (ref 19–32)
CREATININE: 0.83 mg/dL (ref 0.50–1.10)
GFR calc Af Amer: 73 mL/min — ABNORMAL LOW (ref >90)
GFR, EST NON AFRICAN AMERICAN: 63 mL/min — AB (ref >90)
GLUCOSE: 154 mg/dL — AB (ref 70–99)
POTASSIUM: 3.4 mmol/L — AB (ref 3.5–5.1)
Sodium: 136 mmol/L (ref 135–145)
TOTAL PROTEIN: 7.1 g/dL (ref 6.0–8.3)
Total Bilirubin: 0.8 mg/dL (ref 0.3–1.2)

## 2014-01-30 LAB — CBC
HCT: 43.7 % (ref 36.0–46.0)
HEMOGLOBIN: 13.5 g/dL (ref 12.0–15.0)
MCH: 25.5 pg — ABNORMAL LOW (ref 26.0–34.0)
MCHC: 30.9 g/dL (ref 30.0–36.0)
MCV: 82.6 fL (ref 78.0–100.0)
Platelets: 279 10*3/uL (ref 150–400)
RBC: 5.29 MIL/uL — AB (ref 3.87–5.11)
RDW: 16.4 % — AB (ref 11.5–15.5)
WBC: 10.7 10*3/uL — ABNORMAL HIGH (ref 4.0–10.5)

## 2014-01-30 LAB — CBC WITH DIFFERENTIAL/PLATELET
BASOS PCT: 0 % (ref 0–1)
Basophils Absolute: 0 10*3/uL (ref 0.0–0.1)
EOS PCT: 1 % (ref 0–5)
Eosinophils Absolute: 0.1 10*3/uL (ref 0.0–0.7)
HCT: 46.8 % — ABNORMAL HIGH (ref 36.0–46.0)
Hemoglobin: 14.8 g/dL (ref 12.0–15.0)
Lymphocytes Relative: 7 % — ABNORMAL LOW (ref 12–46)
Lymphs Abs: 0.8 10*3/uL (ref 0.7–4.0)
MCH: 25.9 pg — ABNORMAL LOW (ref 26.0–34.0)
MCHC: 31.6 g/dL (ref 30.0–36.0)
MCV: 82 fL (ref 78.0–100.0)
MONO ABS: 0.8 10*3/uL (ref 0.1–1.0)
MONOS PCT: 7 % (ref 3–12)
NEUTROS ABS: 9.3 10*3/uL — AB (ref 1.7–7.7)
NEUTROS PCT: 85 % — AB (ref 43–77)
Platelets: 261 10*3/uL (ref 150–400)
RBC: 5.71 MIL/uL — AB (ref 3.87–5.11)
RDW: 16.5 % — AB (ref 11.5–15.5)
WBC: 10.9 10*3/uL — ABNORMAL HIGH (ref 4.0–10.5)

## 2014-01-30 LAB — URINE MICROSCOPIC-ADD ON

## 2014-01-30 LAB — TROPONIN I

## 2014-01-30 LAB — PROTIME-INR
INR: 2.03 — AB (ref 0.00–1.49)
PROTHROMBIN TIME: 23.1 s — AB (ref 11.6–15.2)

## 2014-01-30 LAB — LIPASE, BLOOD: LIPASE: 25 U/L (ref 11–59)

## 2014-01-30 LAB — TYPE AND SCREEN
ABO/RH(D): O POS
Antibody Screen: NEGATIVE

## 2014-01-30 LAB — CK: Total CK: 44 U/L (ref 7–177)

## 2014-01-30 MED ORDER — METOPROLOL SUCCINATE ER 50 MG PO TB24
50.0000 mg | ORAL_TABLET | Freq: Two times a day (BID) | ORAL | Status: DC
  Administered 2014-01-30 – 2014-02-01 (×4): 50 mg via ORAL
  Filled 2014-01-30 (×4): qty 1

## 2014-01-30 MED ORDER — ONDANSETRON HCL 4 MG/2ML IJ SOLN
4.0000 mg | Freq: Once | INTRAMUSCULAR | Status: AC
Start: 2014-01-30 — End: 2014-01-30
  Administered 2014-01-30: 4 mg via INTRAVENOUS
  Filled 2014-01-30: qty 2

## 2014-01-30 MED ORDER — HYDROCODONE-ACETAMINOPHEN 5-325 MG PO TABS: 1.0000 | ORAL_TABLET | ORAL | Status: DC | PRN

## 2014-01-30 MED ORDER — ACETAMINOPHEN 650 MG RE SUPP: 650.0000 mg | Freq: Four times a day (QID) | RECTAL | Status: DC | PRN

## 2014-01-30 MED ORDER — ACETAMINOPHEN 325 MG PO TABS: 650.0000 mg | ORAL_TABLET | Freq: Four times a day (QID) | ORAL | Status: DC | PRN

## 2014-01-30 MED ORDER — SODIUM CHLORIDE 0.9 % IV BOLUS (SEPSIS)
1000.0000 mL | INTRAVENOUS | Status: AC
  Administered 2014-01-30: 1000 mL via INTRAVENOUS

## 2014-01-30 MED ORDER — POTASSIUM CHLORIDE CRYS ER 20 MEQ PO TBCR
40.0000 meq | EXTENDED_RELEASE_TABLET | Freq: Once | ORAL | Status: AC
  Administered 2014-01-30: 40 meq via ORAL
  Filled 2014-01-30: qty 2

## 2014-01-30 MED ORDER — SODIUM CHLORIDE 0.9 % IV BOLUS (SEPSIS)
500.0000 mL | Freq: Once | INTRAVENOUS | Status: DC
  Administered 2014-01-30: 500 mL via INTRAVENOUS

## 2014-01-30 MED ORDER — LISINOPRIL 10 MG PO TABS
10.0000 mg | ORAL_TABLET | Freq: Every day | ORAL | Status: DC
  Administered 2014-01-31 – 2014-02-01 (×2): 10 mg via ORAL
  Filled 2014-01-30 (×2): qty 1

## 2014-01-30 MED ORDER — DIPHENHYDRAMINE HCL 50 MG/ML IJ SOLN
12.5000 mg | Freq: Once | INTRAMUSCULAR | Status: AC
  Administered 2014-01-30: 12.5 mg via INTRAVENOUS
  Filled 2014-01-30: qty 1

## 2014-01-30 MED ORDER — FLUTICASONE PROPIONATE HFA 44 MCG/ACT IN AERO
1.0000 | INHALATION_SPRAY | Freq: Two times a day (BID) | RESPIRATORY_TRACT | Status: DC
  Administered 2014-01-31 – 2014-02-01 (×3): 1 via RESPIRATORY_TRACT
  Filled 2014-01-30 (×2): qty 10.6

## 2014-01-30 MED ORDER — POTASSIUM CHLORIDE CRYS ER 20 MEQ PO TBCR: 40.0000 meq | EXTENDED_RELEASE_TABLET | Freq: Once | ORAL | Status: DC

## 2014-01-30 MED ORDER — IOHEXOL 300 MG/ML  SOLN
50.0000 mL | Freq: Once | INTRAMUSCULAR | Status: AC | PRN
  Administered 2014-01-30: 50 mL via ORAL

## 2014-01-30 MED ORDER — PRAVASTATIN SODIUM 40 MG PO TABS
40.0000 mg | ORAL_TABLET | Freq: Every day | ORAL | Status: DC
  Administered 2014-01-30 – 2014-02-01 (×3): 40 mg via ORAL
  Filled 2014-01-30 (×3): qty 1

## 2014-01-30 MED ORDER — LATANOPROST 0.005 % OP SOLN
1.0000 [drp] | Freq: Every day | OPHTHALMIC | Status: DC
  Filled 2014-01-30: qty 2.5

## 2014-01-30 MED ORDER — CIPROFLOXACIN IN D5W 400 MG/200ML IV SOLN
400.0000 mg | Freq: Two times a day (BID) | INTRAVENOUS | Status: DC
  Administered 2014-01-30 – 2014-02-01 (×4): 400 mg via INTRAVENOUS
  Filled 2014-01-30 (×4): qty 200

## 2014-01-30 MED ORDER — TRAMADOL HCL 50 MG PO TABS
100.0000 mg | ORAL_TABLET | Freq: Two times a day (BID) | ORAL | Status: DC
  Administered 2014-01-30 – 2014-02-01 (×4): 100 mg via ORAL
  Filled 2014-01-30 (×4): qty 2

## 2014-01-30 MED ORDER — ONDANSETRON HCL 4 MG/2ML IJ SOLN
4.0000 mg | Freq: Four times a day (QID) | INTRAMUSCULAR | Status: DC | PRN
  Administered 2014-01-31: 4 mg via INTRAVENOUS
  Filled 2014-01-30: qty 2

## 2014-01-30 MED ORDER — CEFTRIAXONE SODIUM 1 G IJ SOLR
1.0000 g | Freq: Once | INTRAMUSCULAR | Status: AC
  Administered 2014-01-30: 1 g via INTRAVENOUS
  Filled 2014-01-30: qty 10

## 2014-01-30 MED ORDER — ONDANSETRON HCL 4 MG PO TABS: 4.0000 mg | ORAL_TABLET | Freq: Four times a day (QID) | ORAL | Status: DC | PRN

## 2014-01-30 MED ORDER — ALBUTEROL SULFATE (2.5 MG/3ML) 0.083% IN NEBU: 2.5000 mg | INHALATION_SOLUTION | RESPIRATORY_TRACT | Status: DC | PRN

## 2014-01-30 MED ORDER — SODIUM CHLORIDE 0.9 % IV SOLN: INTRAVENOUS | Status: DC

## 2014-01-30 MED ORDER — CIPROFLOXACIN IN D5W 400 MG/200ML IV SOLN
400.0000 mg | Freq: Two times a day (BID) | INTRAVENOUS | Status: DC
  Filled 2014-01-30: qty 200

## 2014-01-30 MED ORDER — SODIUM CHLORIDE 0.9 % IJ SOLN: 3.0000 mL | Freq: Two times a day (BID) | INTRAMUSCULAR | Status: DC

## 2014-01-30 MED ORDER — IOHEXOL 300 MG/ML  SOLN: 100.0000 mL | Freq: Once | INTRAMUSCULAR | Status: AC | PRN

## 2014-01-30 MED ORDER — ROPINIROLE HCL 0.25 MG PO TABS
0.2500 mg | ORAL_TABLET | Freq: Every day | ORAL | Status: DC
  Administered 2014-01-30 – 2014-01-31 (×2): 0.25 mg via ORAL
  Filled 2014-01-30 (×3): qty 1

## 2014-01-30 MED ORDER — SODIUM CHLORIDE 0.9 % IV SOLN: INTRAVENOUS | Status: AC

## 2014-01-30 NOTE — ED Notes (Signed)
Floor called and was told the bed assigned was not clean and we would have to wait till clean, they will call us back,Charge nurse [place] stated...klj

## 2014-01-30 NOTE — Progress Notes (Signed)
Pt refused CPAP for tonight.  Pt wears nasal pillows and when I informed the Pt that we do not carry nasal pillows and instead use a nasal mask, she stated that she would hold off for tonight.  Pt states she may likely be discharged tomorrow. Pt stated that if she is not discharged in the morning then she will bring her CPAP from home to use.

## 2014-01-30 NOTE — ED Notes (Signed)
Per EMS, pt has had lower abdominal pain since 8AM this morning. Pt also complains of n/v/d "for the past few hours". Pt states her urine has a strong odor. Pt states she had pancreatitis in April.

## 2014-01-30 NOTE — ED Notes (Signed)
Bed: PE16 Expected date: 01/30/14 Expected time: 1:55 PM Means of arrival:  Comments: Abd pain

## 2014-01-30 NOTE — ED Provider Notes (Signed)
CSN: [id number]     Arrival date & time 01/30/14  1403 History   First MD Initiated Contact with Patient 01/30/14 1502     Chief Complaint  Patient presents with  . Abdominal Pain  . Nausea  . Emesis  . Diarrhea     (Consider location/radiation/quality/duration/timing/severity/associated sxs/prior Treatment) Patient is a 81 y.o. female presenting with abdominal pain, vomiting, and diarrhea. The history is provided by the patient.  Abdominal Pain Pain location: lower abd. Pain quality: aching   Pain radiates to:  Does not radiate Pain severity:  Moderate Onset quality:  Sudden Timing:  Intermittent Progression:  Unchanged Chronicity:  New Context comment:  Spontaneously Relieved by:  Nothing Worsened by:  Nothing tried Ineffective treatments:  None tried Associated symptoms: diarrhea, nausea and vomiting   Associated symptoms: no chest pain, no cough, no dysuria, no fatigue, no fever, no hematuria and no shortness of breath   Emesis Associated symptoms: abdominal pain and diarrhea   Associated symptoms: no headaches   Diarrhea Associated symptoms: abdominal pain and vomiting   Associated symptoms: no fever and no headaches     Past Medical History  Diagnosis Date  . Vasovagal syncope   . Hypertension   . Hyperlipidemia   . Orthostatic hypotension   . Osteoporosis   . Pacemaker 10/11/06    implanted by Dr Leonia Reeves for pauses > 4 seconds with afib  . Iron deficiency anemia   . Colitis 5/13  . Chronic low back pain 2008    crushed vertebrae  . Tachycardia-bradycardia syndrome   . Permanent atrial fibrillation   . Asymptomatic PVCs   . History of colonic polyps   . Extrinsic asthma, unspecified     LOV Dr Joya Gaskins 7/12  . Long term (current) use of anticoagulants   . Cholelithiasis   . Generalized osteoarthrosis, unspecified site   . Pancreatitis    Past Surgical History  Procedure Laterality Date  . Vesicovaginal fistula closure w/ tah  1981  . Pacemaker  insertion  10/11/2006    SJM Zephyr XL DR implanted by Dr Leonia Reeves for afib with pause > 4 seconds  . Colonoscopy    . Appendectomy    . Abdominal hysterectomy    . Cholecystectomy  09/17/2011    Procedure: LAPAROSCOPIC CHOLECYSTECTOMY;  Surgeon: Rolm Bookbinder, MD;  Location: [facility];  Service: General;;  . Cardiac catheterization  2009    Normal coronary arteries  . Ercp N/A 04/07/2013    Procedure: ENDOSCOPIC RETROGRADE CHOLANGIOPANCREATOGRAPHY (ERCP);  Surgeon: Jeryl Columbia, MD;  Location: [facility];  Service: Endoscopy;  Laterality: N/A;  . Eus  04/07/2013    Procedure: UPPER ENDOSCOPIC ULTRASOUND (EUS) RADIAL;  Surgeon: Jeryl Columbia, MD;  Location: [facility];  Service: Endoscopy;;  . Hip arthroplasty Left 08/17/2013    Procedure: ARTHROPLASTY BIPOLAR HIP;  Surgeon: Renette Butters, MD;  Location: [facility];  Service: Orthopedics;  Laterality: Left;   Family History  Problem Relation Age of Onset  . Hypertension Mother    History  Substance Use Topics  . Smoking status: Never Smoker   . Smokeless tobacco: Never Used  . Alcohol Use: No   OB History    No data available     Review of Systems  Constitutional: Negative for fever and fatigue.  HENT: Negative for congestion and drooling.   Eyes: Negative for pain.  Respiratory: Negative for cough and shortness of breath.   Cardiovascular: Negative for chest pain.  Gastrointestinal: Positive for nausea, vomiting,  abdominal pain and diarrhea.  Genitourinary: Negative for dysuria and hematuria.  Musculoskeletal: Negative for back pain, gait problem and neck pain.  Skin: Negative for color change.  Neurological: Negative for dizziness and headaches.  Hematological: Negative for adenopathy.  Psychiatric/Behavioral: Negative for behavioral problems.  All other systems reviewed and are negative.     Allergies  Betadine and Codeine  Home Medications   Prior to Admission medications   Medication Sig Start Date End Date  Taking? Authorizing Provider  beclomethasone (QVAR) 40 MCG/ACT inhaler Inhale 2 puffs into the lungs 2 (two) times daily as needed (breathing, shortness of breath).   Yes Historical Provider, MD  Cholecalciferol (VITAMIN D3) 1000 UNITS CAPS Take 2 capsules by mouth daily.     Historical Provider, MD  iron polysaccharides (NIFEREX) 150 MG capsule Take 1 capsule (150 mg total) by mouth daily with breakfast. 08/31/13   Ivan Anchors Love, PA-C  lisinopril (PRINIVIL,ZESTRIL) 10 MG tablet Take 10 mg by mouth daily.    Historical Provider, MD  metoprolol succinate (TOPROL-XL) 50 MG 24 hr tablet Take 1 tablet (50 mg total) by mouth 2 (two) times daily. Note increase in dose 08/31/13   Ivan Anchors Love, PA-C  nitroGLYCERIN (NITROSTAT) 0.4 MG SL tablet Place 1 tablet (0.4 mg total) under the tongue every 5 (five) minutes as needed for chest pain. 04/16/13   Thompson Grayer, MD  pravastatin (PRAVACHOL) 40 MG tablet Take 1 tablet (40 mg total) by mouth daily. Once daily 07/08/13   Sueanne Margarita, MD  rOPINIRole (REQUIP) 0.25 MG tablet Take 0.25 mg by mouth at bedtime. Takes 1-2 tablets    Historical Provider, MD  traMADol (ULTRAM) 50 MG tablet Take 1 tablet (50 mg total) by mouth every 6 (six) hours. For pain. Taper to three times a day in a few days. Then further to twice a day. Then as needed. 08/31/13   Ivan Anchors Love, PA-C  TRAVATAN Z 0.004 % SOLN ophthalmic solution Place 1 drop into both eyes daily. 01/04/13   Historical Provider, MD  vitamin B-12 (CYANOCOBALAMIN) 100 MCG tablet Take 100 mcg by mouth once a week.    Historical Provider, MD  warfarin (COUMADIN) 3 MG tablet 1 tablet daily except 1.5 tablets on Thursdays or as directed by coumadin clinic 01/06/14   Sueanne Margarita, MD   BP 184/89 mmHg  Pulse 90  Temp(Src) 97.7 F (36.5 C) (Oral)  Resp 17  SpO2 96% Physical Exam  Constitutional: She is oriented to person, place, and time. She appears well-developed and well-nourished.  HENT:  Head: Normocephalic.   Mouth/Throat: Oropharynx is clear and moist. No oropharyngeal exudate.  Eyes: Conjunctivae and EOM are normal. Pupils are equal, round, and reactive to light.  Neck: Normal range of motion. Neck supple.  Cardiovascular: Normal rate, regular rhythm, normal heart sounds and intact distal pulses.  Exam reveals no gallop and no friction rub.   No murmur heard. Pulmonary/Chest: Effort normal and breath sounds normal. No respiratory distress. She has no wheezes.  Abdominal: Soft. Bowel sounds are normal. There is no tenderness. There is no rebound and no guarding.  Genitourinary:  Normal-appearing external rectum. Brown stool which was Hemoccult positive. No obvious blood in the stool.  Musculoskeletal: Normal range of motion. She exhibits no edema or tenderness.  Neurological: She is alert and oriented to person, place, and time.  Skin: Skin is warm and dry.  Psychiatric: She has a normal mood and affect. Her behavior is normal.  Nursing note  and vitals reviewed.   ED Course  Procedures (including critical care time) Labs Review Labs Reviewed  CBC WITH DIFFERENTIAL/PLATELET - Abnormal; Notable for the following:    WBC 10.9 (*)    RBC 5.71 (*)    HCT 46.8 (*)    MCH 25.9 (*)    RDW 16.5 (*)    Neutrophils Relative % 85 (*)    Neutro Abs 9.3 (*)    Lymphocytes Relative 7 (*)    All other components within normal limits  COMPREHENSIVE METABOLIC PANEL - Abnormal; Notable for the following:    Potassium 3.4 (*)    Glucose, Bld 154 (*)    Alkaline Phosphatase 126 (*)    GFR calc non Af Amer 63 (*)    GFR calc Af Amer 73 (*)    All other components within normal limits  URINALYSIS, ROUTINE W REFLEX MICROSCOPIC - Abnormal; Notable for the following:    APPearance CLOUDY (*)    Protein, ur 30 (*)    Nitrite POSITIVE (*)    All other components within normal limits  URINE MICROSCOPIC-ADD ON - Abnormal; Notable for the following:    Bacteria, UA MANY (*)    All other components within  normal limits  PROTIME-INR - Abnormal; Notable for the following:    Prothrombin Time 23.1 (*)    INR 2.03 (*)    All other components within normal limits  COMPREHENSIVE METABOLIC PANEL - Abnormal; Notable for the following:    Potassium 3.2 (*)    Glucose, Bld 126 (*)    Albumin 3.3 (*)    GFR calc non Af Amer 77 (*)    GFR calc Af Amer 90 (*)    All other components within normal limits  CBC - Abnormal; Notable for the following:    WBC 11.8 (*)    RBC 5.21 (*)    MCH 25.7 (*)    RDW 16.4 (*)    All other components within normal limits  CBC - Abnormal; Notable for the following:    WBC 10.7 (*)    RBC 5.29 (*)    MCH 25.5 (*)    RDW 16.4 (*)    All other components within normal limits  HEMOGLOBIN A1C - Abnormal; Notable for the following:    Hgb A1c MFr Bld 6.0 (*)    Mean Plasma Glucose 126 (*)    All other components within normal limits  CBC - Abnormal; Notable for the following:    WBC 10.8 (*)    MCH 25.8 (*)    RDW 16.5 (*)    All other components within normal limits  PROTIME-INR - Abnormal; Notable for the following:    Prothrombin Time 26.6 (*)    INR 2.42 (*)    All other components within normal limits  I-STAT CG4 LACTIC ACID, ED - Abnormal; Notable for the following:    Lactic Acid, Venous 3.20 (*)    All other components within normal limits  POC OCCULT BLOOD, ED - Abnormal; Notable for the following:    Fecal Occult Bld POSITIVE (*)    All other components within normal limits  I-STAT CG4 LACTIC ACID, ED - Abnormal; Notable for the following:    Lactic Acid, Venous 2.31 (*)    All other components within normal limits  URINE CULTURE  STOOL CULTURE  LIPASE, BLOOD  TROPONIN I  TROPONIN I  TROPONIN I  MAGNESIUM  PHOSPHORUS  TSH  LACTIC ACID, PLASMA  CK  OCCULT  BLOOD X 1 CARD TO LAB, STOOL  FECAL LACTOFERRIN  CBC  BASIC METABOLIC PANEL  TYPE AND SCREEN    Imaging Review Ct Abdomen Pelvis Wo Contrast  01/30/2014   CLINICAL DATA:  pt has  had lower abdominal pain since 8AM this morning. Pt also complains of n/v/d "for the past few hours". Pt states her urine has a strong odor. Pt states she had pancreatitis in April 2015. Hx of gallstones, pancreatitis, vaginal fistula repair. Hx of appy, hysterectomy, ERCP  EXAM: CT ABDOMEN AND PELVIS WITHOUT CONTRAST  TECHNIQUE: Multidetector CT imaging of the abdomen and pelvis was performed following the standard protocol without IV contrast.  COMPARISON:  04/06/2013  FINDINGS: Colon mostly decompressed. There is no bowel wall thickening or mesenteric inflammation.  Minor reticular scarring at the lung bases.  Liver and spleen are unremarkable. Gallbladder surgically absent. Chronic dilation of the common bile duct with distal tapering. No pancreatic mass or inflammation. No adrenal masses.  Normal kidneys, ureters and bladder.  Uterus surgically absent.  No pelvic masses.  No pathologically enlarged lymph nodes.  No ascites.  There is tortuosity and atherosclerotic calcification throughout the abdominal aorta. No aneurysm.  Left hip prosthesis well-seated and aligned. Moderate compression fracture of L1 treated with vertebroplasty. Bones are diffusely demineralized. No osteoblastic or osteolytic lesions.  IMPRESSION: 1. No acute findings. 2. No findings to explain this patient's symptoms. 3. Chronic findings include changes from a cholecystectomy and hysterectomy, atherosclerotic changes throughout the aorta, changes from a left hip arthroplasty and vertebroplasty of a fracture of L1.   Electronically Signed   By: Lajean Manes M.D.   On: 01/30/2014 17:20     EKG Interpretation   Date/Time:  Sunday January 30 2014 15:39:58 EST Ventricular Rate:  87 PR Interval:    QRS Duration: 88 QT Interval:  378 QTC Calculation: 455 R Axis:   5 Text Interpretation:  Atrial fibrillation LVH with secondary  repolarization abnormality changes noted Confirmed by Necola Bluestein  MD,  Jasyn Mey ([id number]) on 01/30/2014 3:46:41  PM      MDM   Final diagnoses:  Lower abdominal pain  UTI (lower urinary tract infection)  Occult blood positive stool  Nausea and vomiting, vomiting of unspecified type  Diarrhea    3:49 PM 81 y.o. female w hx of afib on coumadin, pancreatitis, pacemaker who presents with nonspecific lower abdominal pain which began early this morning. She states that she's not felt well for the last few days and began having waves of abdominal pain, nausea, vomiting, and diarrhea this morning. Her family believes they saw a scant amount of bright red blood in her stool. She notes that her abdominal pain has resolved for the moment. She does feel slightly nauseous. She is afebrile and vital signs are unremarkable here. We'll get screening labs and imaging. Hemoccult positive with brown stool.  Will admit for continued sx control.   Pamella Pert, MD 01/31/14 2213

## 2014-01-30 NOTE — H&P (Signed)
PCP: [name], MD  [facility]  Chief Complaint:  Abdominal pain  HPI: Anna Farrell is a 81 y.o. female   has a past medical history of Vasovagal syncope; Hypertension; Hyperlipidemia; Orthostatic hypotension; Osteoporosis; Pacemaker (10/11/06); Iron deficiency anemia; Colitis (5/13); Chronic low back pain (2008); Tachycardia-bradycardia syndrome; Permanent atrial fibrillation; Asymptomatic PVCs; History of colonic polyps; Extrinsic asthma, unspecified; Long term (current) use of anticoagulants; Cholelithiasis; Generalized osteoarthrosis, unspecified site; and Pancreatitis.   Presented with  She has not felt well for 2-3 days, today she developed nausea, vomiting, and lower abdominal pain. She had few bouts of watery diarrhea. Family noted some loose stools with a streak of bright red blood. She is therapeutic on coumadin for hx of A.fib.  She had an episode of Pancreatitis in April. CT scan was unremarkable. Lipase wnl UA showed many bacteria. Patient was given Rocephin and developed itching. In ER she was noted to have elevated lactic acid 2.3 She feels light-headed when she stands up. ECG noted to have ST depressions inlateral leads,  No chest pain or shortnes of breath. Troponin not obtained will order Has hx of OSA on CPAP.   Hospitalist was called for admission for  UTI , dehydration, abnormal ECG  Review of Systems:    Pertinent positives include: fatigue, abdominal pain, nausea, vomiting, diarrhea,  Constitutional:  No weight loss, night sweats, Fevers, chills,  weight loss  HEENT:  No headaches, Difficulty swallowing,Tooth/dental problems,Sore throat,  No sneezing, itching, ear ache, nasal congestion, post nasal drip,  Cardio-vascular:  No chest pain, Orthopnea, PND, anasarca, dizziness, palpitations.no Bilateral lower extremity swelling  GI:  No heartburn, indigestion change in bowel habits, loss of appetite, melena, blood in stool,  hematemesis Resp:  no shortness of breath at rest. No dyspnea on exertion, No excess mucus, no productive cough, No non-productive cough, No coughing up of blood.No change in color of mucus.No wheezing. Skin:  no rash or lesions. No jaundice GU:  no dysuria, change in color of urine, no urgency or frequency. No straining to urinate.  No flank pain.  Musculoskeletal:  No joint pain or no joint swelling. No decreased range of motion. No back pain.  Psych:  No change in mood or affect. No depression or anxiety. No memory loss.  Neuro: no localizing neurological complaints, no tingling, no weakness, no double vision, no gait abnormality, no slurred speech, no confusion  Otherwise ROS are negative except for above, 10 systems were reviewed  Past Medical History: Past Medical History  Diagnosis Date  . Vasovagal syncope   . Hypertension   . Hyperlipidemia   . Orthostatic hypotension   . Osteoporosis   . Pacemaker 10/11/06    implanted by Dr Leonia Reeves for pauses > 4 seconds with afib  . Iron deficiency anemia   . Colitis 5/13  . Chronic low back pain 2008    crushed vertebrae  . Tachycardia-bradycardia syndrome   . Permanent atrial fibrillation   . Asymptomatic PVCs   . History of colonic polyps   . Extrinsic asthma, unspecified     LOV Dr Joya Gaskins 7/12  . Long term (current) use of anticoagulants   . Cholelithiasis   . Generalized osteoarthrosis, unspecified site   . Pancreatitis    Past Surgical History  Procedure Laterality Date  . Vesicovaginal fistula closure w/ tah  1981  . Pacemaker insertion  10/11/2006    SJM Zephyr XL DR implanted by Dr Leonia Reeves for afib with pause > 4 seconds  .  Colonoscopy    . Appendectomy    . Abdominal hysterectomy    . Cholecystectomy  09/17/2011    Procedure: LAPAROSCOPIC CHOLECYSTECTOMY;  Surgeon: Rolm Bookbinder, MD;  Location: [facility];  Service: General;;  . Cardiac catheterization  2009    Normal coronary arteries  . Ercp N/A 04/07/2013     Procedure: ENDOSCOPIC RETROGRADE CHOLANGIOPANCREATOGRAPHY (ERCP);  Surgeon: Jeryl Columbia, MD;  Location: [facility];  Service: Endoscopy;  Laterality: N/A;  . Eus  04/07/2013    Procedure: UPPER ENDOSCOPIC ULTRASOUND (EUS) RADIAL;  Surgeon: Jeryl Columbia, MD;  Location: [facility];  Service: Endoscopy;;  . Hip arthroplasty Left 08/17/2013    Procedure: ARTHROPLASTY BIPOLAR HIP;  Surgeon: Renette Butters, MD;  Location: [facility];  Service: Orthopedics;  Laterality: Left;     Medications: Prior to Admission medications   Medication Sig Start Date End Date Taking? Authorizing Provider  beclomethasone (QVAR) 40 MCG/ACT inhaler Inhale 2 puffs into the lungs 2 (two) times daily as needed (breathing, shortness of breath).   Yes Historical Provider, MD  Cholecalciferol (VITAMIN D3) 1000 UNITS CAPS Take 2 capsules by mouth daily.    Yes Historical Provider, MD  lisinopril (PRINIVIL,ZESTRIL) 10 MG tablet Take 10 mg by mouth daily.   Yes Historical Provider, MD  metoprolol succinate (TOPROL-XL) 50 MG 24 hr tablet Take 1 tablet (50 mg total) by mouth 2 (two) times daily. Note increase in dose 08/31/13  Yes Ivan Anchors Love, PA-C  nitroGLYCERIN (NITROSTAT) 0.4 MG SL tablet Place 1 tablet (0.4 mg total) under the tongue every 5 (five) minutes as needed for chest pain. 04/16/13  Yes Thompson Grayer, MD  pravastatin (PRAVACHOL) 40 MG tablet Take 1 tablet (40 mg total) by mouth daily. Once daily 07/08/13  Yes Sueanne Margarita, MD  rOPINIRole (REQUIP) 0.25 MG tablet Take 0.25 mg by mouth at bedtime. Takes 1-2 tablets   Yes Historical Provider, MD  traMADol (ULTRAM) 50 MG tablet Take 1 tablet (50 mg total) by mouth every 6 (six) hours. For pain. Taper to three times a day in a few days. Then further to twice a day. Then as needed. Patient taking differently: Take 100 mg by mouth 2 (two) times daily.  08/31/13  Yes Pamela S Love, PA-C  TRAVATAN Z 0.004 % SOLN ophthalmic solution Place 1 drop into both eyes daily. 01/04/13  Yes  Historical Provider, MD  vitamin B-12 (CYANOCOBALAMIN) 100 MCG tablet Take 100 mcg by mouth once a week.   Yes Historical Provider, MD  warfarin (COUMADIN) 3 MG tablet 1 tablet daily except 1.5 tablets on Thursdays or as directed by coumadin clinic 01/06/14  Yes Sueanne Margarita, MD  iron polysaccharides (NIFEREX) 150 MG capsule Take 1 capsule (150 mg total) by mouth daily with breakfast. Patient not taking: Reported on 01/30/2014 08/31/13   Bary Leriche, PA-C    Allergies:   Allergies  Allergen Reactions  . Betadine [Povidone Iodine]     burning  . Codeine Nausea And Vomiting    Passed out  . Rocephin [Ceftriaxone Sodium In Dextrose] Itching    Social History:  Ambulatory  cane,   Lives at home  With family     reports that she has never smoked. She has never used smokeless tobacco. She reports that she does not drink alcohol or use illicit drugs.    Family History: family history includes Hypertension in her mother; Pancreatitis in her mother.    Physical Exam: Patient Vitals for the past  24 hrs:  BP Temp Temp src Pulse Resp SpO2  01/30/14 1450 184/89 mmHg 97.7 F (36.5 C) Oral 90 17 96 %    1. General:  in No Acute distress 2. Psychological: Alert and  Oriented 3. Head/ENT:    Dry Mucous Membranes                          Head Non traumatic, neck supple                          Normal  Dentition 4. SKIN:   decreased Skin turgor,  Skin clean Dry and intact no rash 5. Heart: Regular rate and rhythm no Murmur, Rub or gallop 6. Lungs: no wheezes or crackles  Somewhat distant 7. Abdomen: Soft, non-tender, Non distended 8. Lower extremities: no clubbing, cyanosis, or edema 9. Neurologically Grossly intact, moving all 4 extremities equally 10. MSK: Normal range of motion Hemoccult positive  body mass index is unknown because there is no weight on file.   Labs on Admission:   Results for orders placed or performed during the hospital encounter of 01/30/14 (from the  past 24 hour(s))  CBC with Differential     Status: Abnormal   Collection Time: 01/30/14  2:15 PM  Result Value Ref Range   WBC 10.9 (H) 4.0 - 10.5 K/uL   RBC 5.71 (H) 3.87 - 5.11 MIL/uL   Hemoglobin 14.8 12.0 - 15.0 g/dL   HCT 46.8 (H) 36.0 - 46.0 %   MCV 82.0 78.0 - 100.0 fL   MCH 25.9 (L) 26.0 - 34.0 pg   MCHC 31.6 30.0 - 36.0 g/dL   RDW 16.5 (H) 11.5 - 15.5 %   Platelets 261 150 - 400 K/uL   Neutrophils Relative % 85 (H) 43 - 77 %   Neutro Abs 9.3 (H) 1.7 - 7.7 K/uL   Lymphocytes Relative 7 (L) 12 - 46 %   Lymphs Abs 0.8 0.7 - 4.0 K/uL   Monocytes Relative 7 3 - 12 %   Monocytes Absolute 0.8 0.1 - 1.0 K/uL   Eosinophils Relative 1 0 - 5 %   Eosinophils Absolute 0.1 0.0 - 0.7 K/uL   Basophils Relative 0 0 - 1 %   Basophils Absolute 0.0 0.0 - 0.1 K/uL  Comprehensive metabolic panel     Status: Abnormal   Collection Time: 01/30/14  2:15 PM  Result Value Ref Range   Sodium 136 135 - 145 mmol/L   Potassium 3.4 (L) 3.5 - 5.1 mmol/L   Chloride 101 96 - 112 mmol/L   CO2 26 19 - 32 mmol/L   Glucose, Bld 154 (H) 70 - 99 mg/dL   BUN 14 6 - 23 mg/dL   Creatinine, Ser 0.83 0.50 - 1.10 mg/dL   Calcium 9.5 8.4 - 10.5 mg/dL   Total Protein 7.1 6.0 - 8.3 g/dL   Albumin 3.8 3.5 - 5.2 g/dL   AST 25 0 - 37 U/L   ALT 16 0 - 35 U/L   Alkaline Phosphatase 126 (H) 39 - 117 U/L   Total Bilirubin 0.8 0.3 - 1.2 mg/dL   GFR calc non Af Amer 63 (L) >90 mL/min   GFR calc Af Amer 73 (L) >90 mL/min   Anion gap 9 5 - 15  Lipase, blood     Status: None   Collection Time: 01/30/14  2:15 PM  Result Value Ref Range  Lipase 25 11 - 59 U/L  Urinalysis, Routine w reflex microscopic     Status: Abnormal   Collection Time: 01/30/14  2:21 PM  Result Value Ref Range   Color, Urine YELLOW YELLOW   APPearance CLOUDY (A) CLEAR   Specific Gravity, Urine 1.013 1.005 - 1.030   pH 7.0 5.0 - 8.0   Glucose, UA NEGATIVE NEGATIVE mg/dL   Hgb urine dipstick NEGATIVE NEGATIVE   Bilirubin Urine NEGATIVE NEGATIVE    Ketones, ur NEGATIVE NEGATIVE mg/dL   Protein, ur 30 (A) NEGATIVE mg/dL   Urobilinogen, UA 1.0 0.0 - 1.0 mg/dL   Nitrite POSITIVE (A) NEGATIVE   Leukocytes, UA NEGATIVE NEGATIVE  Urine microscopic-add on     Status: Abnormal   Collection Time: 01/30/14  2:21 PM  Result Value Ref Range   Squamous Epithelial / LPF RARE RARE   WBC, UA 0-2 <3 WBC/hpf   Bacteria, UA MANY (A) RARE   Urine-Other AMORPHOUS URATES/PHOSPHATES   POC occult blood, ED     Status: Abnormal   Collection Time: 01/30/14  3:25 PM  Result Value Ref Range   Fecal Occult Bld POSITIVE (A) NEGATIVE  I-Stat CG4 Lactic Acid, ED     Status: Abnormal   Collection Time: 01/30/14  3:30 PM  Result Value Ref Range   Lactic Acid, Venous 3.20 (HH) 0.5 - 2.0 mmol/L   Comment NOTIFIED PHYSICIAN   Protime-INR     Status: Abnormal   Collection Time: 01/30/14  3:56 PM  Result Value Ref Range   Prothrombin Time 23.1 (H) 11.6 - 15.2 seconds   INR 2.03 (H) 0.00 - 1.49  I-Stat CG4 Lactic Acid, ED     Status: Abnormal   Collection Time: 01/30/14  4:07 PM  Result Value Ref Range   Lactic Acid, Venous 2.31 (HH) 0.5 - 2.0 mmol/L   Comment NOTIFIED PHYSICIAN     UA Nitrite positive Many bacteria  No results found for: HGBA1C  CrCl cannot be calculated (Unknown ideal weight.).  BNP (last 3 results)  Recent Labs  10/12/13 1609  PROBNP 127.0*    Other results:  I have pearsonaly reviewed this: ECG REPORT  Rate: 87  Rhythm: a.fib ST&T Change:ST depression   There were no vitals filed for this visit.   Cultures:    Component Value Date/Time   SDES URINE, CLEAN CATCH 08/23/2013 1408   SPECREQUEST NONE 08/23/2013 1408   CULT  08/23/2013 1408    STAPHYLOCOCCUS SPECIES (COAGULASE NEGATIVE) Note: RIFAMPIN AND GENTAMICIN SHOULD NOT BE USED AS SINGLE DRUGS FOR TREATMENT OF STAPH INFECTIONS. Performed at [place] 08/26/2013 FINAL 08/23/2013 1408     Radiological Exams on Admission: Ct Abdomen  Pelvis Wo Contrast  01/30/2014   CLINICAL DATA:  pt has had lower abdominal pain since 8AM this morning. Pt also complains of n/v/d "for the past few hours". Pt states her urine has a strong odor. Pt states she had pancreatitis in April 2015. Hx of gallstones, pancreatitis, vaginal fistula repair. Hx of appy, hysterectomy, ERCP  EXAM: CT ABDOMEN AND PELVIS WITHOUT CONTRAST  TECHNIQUE: Multidetector CT imaging of the abdomen and pelvis was performed following the standard protocol without IV contrast.  COMPARISON:  04/06/2013  FINDINGS: Colon mostly decompressed. There is no bowel wall thickening or mesenteric inflammation.  Minor reticular scarring at the lung bases.  Liver and spleen are unremarkable. Gallbladder surgically absent. Chronic dilation of the common bile duct with distal tapering. No pancreatic mass or inflammation.  No adrenal masses.  Normal kidneys, ureters and bladder.  Uterus surgically absent.  No pelvic masses.  No pathologically enlarged lymph nodes.  No ascites.  There is tortuosity and atherosclerotic calcification throughout the abdominal aorta. No aneurysm.  Left hip prosthesis well-seated and aligned. Moderate compression fracture of L1 treated with vertebroplasty. Bones are diffusely demineralized. No osteoblastic or osteolytic lesions.  IMPRESSION: 1. No acute findings. 2. No findings to explain this patient's symptoms. 3. Chronic findings include changes from a cholecystectomy and hysterectomy, atherosclerotic changes throughout the aorta, changes from a left hip arthroplasty and vertebroplasty of a fracture of L1.   Electronically Signed   By: Lajean Manes M.D.   On: 01/30/2014 17:20    Chart has been reviewed  Assessment/Plan   81 year old female with history of atrial fibrillation, status post pacemaker on chronic anticoagulation presents with nausea vomiting slight diarrhea with very small amount of bright red blood mixed with stool, stable hemoglobin UA worrisome for  urinary tract infection. EKG with nonspecific changes with no chest pain.  Present on Admission:  . Vomiting/diarrhea - mild possible gastroenteritis CT scan of abdomen unremarkable continue to monitor sent stool cultures  . UTI (lower urinary tract infection) - await results of urine culture so far cover with Cipro  . Dehydration - give IV fluids patient has history of orthostasis were checked prior to discharge  . Cardiac pacemaker in situ - stable  . Chronic atrial fibrillation - chronic, rate control continue metoprolol. Hold Coumadin given bright red blood in stool, for now until it is cleared to extent of a problem  . Essential hypertension - continue home medication  . Hypokalemia - will replace  . OSA (obstructive sleep apnea) Joya Gaskins for CPap  . Blood in the stool - very mild the setting of recent diarrhea, patient is on Coumadin therapeutic INR. We will monitor CBC current hemoglobin stable, clear diet CT scan did not show any evidence of colitis, if this persists to become significant will have GI consult  . Abnormal ECG - no chest pain or shortness of breath. We'll obtain serial EKGs cycle cardiac enzymes monitored in telemetry patient is therapeutic on Coumadin   Prophylaxis: SCD hold coumadin Protonix  CODE STATUS:  FULL CODE    Other plan as per orders.  I have spent a total of 55 min on this admission  Akio Hudnall 01/30/2014, 6:50 PM  [facility]  Pager [phone]   after 2 AM please page floor coverage PA If 7AM-7PM, please contact the day team taking care of the patient  [email or web address]  Password [email or web address]

## 2014-01-31 LAB — TSH: TSH: 3.673 u[IU]/mL (ref 0.350–4.500)

## 2014-01-31 LAB — COMPREHENSIVE METABOLIC PANEL
ALBUMIN: 3.3 g/dL — AB (ref 3.5–5.2)
ALK PHOS: 97 U/L (ref 39–117)
ALT: 13 U/L (ref 0–35)
ANION GAP: 7 (ref 5–15)
AST: 22 U/L (ref 0–37)
BILIRUBIN TOTAL: 0.9 mg/dL (ref 0.3–1.2)
BUN: 13 mg/dL (ref 6–23)
CO2: 26 mmol/L (ref 19–32)
Calcium: 8.8 mg/dL (ref 8.4–10.5)
Chloride: 102 mmol/L (ref 96–112)
Creatinine, Ser: 0.7 mg/dL (ref 0.50–1.10)
GFR calc Af Amer: 90 mL/min — ABNORMAL LOW (ref >90)
GFR calc non Af Amer: 77 mL/min — ABNORMAL LOW (ref >90)
Glucose, Bld: 126 mg/dL — ABNORMAL HIGH (ref 70–99)
Potassium: 3.2 mmol/L — ABNORMAL LOW (ref 3.5–5.1)
Sodium: 135 mmol/L (ref 135–145)
TOTAL PROTEIN: 6.3 g/dL (ref 6.0–8.3)

## 2014-01-31 LAB — CBC
HCT: 40.3 % (ref 36.0–46.0)
HEMATOCRIT: 42.7 % (ref 36.0–46.0)
HEMOGLOBIN: 13.4 g/dL (ref 12.0–15.0)
Hemoglobin: 12.7 g/dL (ref 12.0–15.0)
MCH: 25.7 pg — ABNORMAL LOW (ref 26.0–34.0)
MCH: 25.8 pg — ABNORMAL LOW (ref 26.0–34.0)
MCHC: 31.4 g/dL (ref 30.0–36.0)
MCHC: 31.5 g/dL (ref 30.0–36.0)
MCV: 82 fL (ref 78.0–100.0)
MCV: 81.7 fL (ref 78.0–100.0)
Platelets: 251 10*3/uL (ref 150–400)
Platelets: 234 10*3/uL (ref 150–400)
RBC: 5.21 MIL/uL — AB (ref 3.87–5.11)
RBC: 4.93 MIL/uL (ref 3.87–5.11)
RDW: 16.4 % — AB (ref 11.5–15.5)
RDW: 16.5 % — ABNORMAL HIGH (ref 11.5–15.5)
WBC: 11.8 10*3/uL — ABNORMAL HIGH (ref 4.0–10.5)
WBC: 10.8 10*3/uL — AB (ref 4.0–10.5)

## 2014-01-31 LAB — TROPONIN I: Troponin I: <0.03 ng/mL (ref <0.031)

## 2014-01-31 LAB — PROTIME-INR
INR: 2.42 — ABNORMAL HIGH (ref 0.00–1.49)
PROTHROMBIN TIME: 26.6 s — AB (ref 11.6–15.2)

## 2014-01-31 LAB — HEMOGLOBIN A1C
HEMOGLOBIN A1C: 6 % — AB (ref <5.7)
Mean Plasma Glucose: 126 mg/dL — ABNORMAL HIGH (ref <117)

## 2014-01-31 LAB — LACTIC ACID, PLASMA: Lactic Acid, Venous: 1.1 mmol/L (ref 0.5–2.0)

## 2014-01-31 LAB — MAGNESIUM: Magnesium: 1.9 mg/dL (ref 1.5–2.5)

## 2014-01-31 LAB — PHOSPHORUS: Phosphorus: 3 mg/dL (ref 2.3–4.6)

## 2014-01-31 MED ORDER — KETOROLAC TROMETHAMINE 30 MG/ML IJ SOLN: 15.0000 mg | Freq: Four times a day (QID) | INTRAMUSCULAR | Status: DC | PRN

## 2014-01-31 MED ORDER — KETOROLAC TROMETHAMINE 30 MG/ML IJ SOLN: 30.0000 mg | Freq: Four times a day (QID) | INTRAMUSCULAR | Status: DC | PRN

## 2014-01-31 MED ORDER — WARFARIN - PHARMACIST DOSING INPATIENT: Freq: Every day | Status: DC

## 2014-01-31 MED ORDER — POTASSIUM CHLORIDE CRYS ER 20 MEQ PO TBCR
40.0000 meq | EXTENDED_RELEASE_TABLET | Freq: Once | ORAL | Status: AC
  Administered 2014-01-31: 40 meq via ORAL
  Filled 2014-01-31: qty 2

## 2014-01-31 MED ORDER — SODIUM CHLORIDE 0.9 % IV SOLN
INTRAVENOUS | Status: AC
  Administered 2014-01-31: 10:00:00 via INTRAVENOUS

## 2014-01-31 NOTE — Progress Notes (Signed)
ANTICOAGULATION CONSULT NOTE - Initial Consult  Pharmacy Consult for Warfarin Indication: atrial fibrillation  Allergies  Allergen Reactions  . Betadine [Povidone Iodine]     burning  . Codeine Nausea And Vomiting    Passed out  . Rocephin [Ceftriaxone Sodium In Dextrose] Itching    Patient Measurements: Height: 5\' 2"  (157.5 cm) Weight: 130 lb 11.7 oz (59.3 kg) IBW/kg (Calculated) : 50.1  Vital Signs: Temp: 97.9 F (36.6 C) (01/25 0538) Temp Source: Oral (01/25 0538) BP: 132/95 mmHg (01/25 0543) Pulse Rate: 105 (01/25 0543)  Labs:  Recent Labs  01/30/14 1415 01/30/14 1556 01/30/14 1940 01/30/14 2000 01/31/14 0140 01/31/14 0214 01/31/14 0735 01/31/14 0736  HGB 14.8  --   --  13.5  --  13.4  --  12.7  HCT 46.8*  --   --  43.7  --  42.7  --  40.3  PLT 261  --   --  279  --  251  --  234  LABPROT  --  23.1*  --   --   --   --   --   --   INR  --  2.03*  --   --   --   --   --   --   CREATININE 0.83  --   --   --   --  0.70  --   --   CKTOTAL  --   --  44  --   --   --   --   --   TROPONINI  --   --  <0.03  --  <0.03  --  <0.03  --     Estimated Creatinine Clearance: 41.4 mL/min (by C-G formula based on Cr of 0.7).   Medical History: Past Medical History  Diagnosis Date  . Vasovagal syncope   . Hypertension   . Hyperlipidemia   . Orthostatic hypotension   . Osteoporosis   . Pacemaker 10/11/06    implanted by Dr Leonia Reeves for pauses > 4 seconds with afib  . Iron deficiency anemia   . Colitis 5/13  . Chronic low back pain 2008    crushed vertebrae  . Tachycardia-bradycardia syndrome   . Permanent atrial fibrillation   . Asymptomatic PVCs   . History of colonic polyps   . Extrinsic asthma, unspecified     LOV Dr Joya Gaskins 7/12  . Long term (current) use of anticoagulants   . Cholelithiasis   . Generalized osteoarthrosis, unspecified site   . Pancreatitis     Medications:  Scheduled:  . ciprofloxacin  400 mg Intravenous Q12H  . fluticasone  1 puff  Inhalation BID  . latanoprost  1 drop Both Eyes QHS  . lisinopril  10 mg Oral Daily  . metoprolol succinate  50 mg Oral BID  . pravastatin  40 mg Oral Daily  . rOPINIRole  0.25 mg Oral QHS  . sodium chloride  3 mL Intravenous Q12H  . traMADol  100 mg Oral BID    Assessment: 73 yoF admitted 1/24 with abdominal pain, N/V/D notable for a streak of bright red blood in stool.  PMH includes atrial fibrillation on chronic warfarin.  Reported home warfarin dose is 3mg  daily except 4.5mg  on Thursdays (last dose 1/23).  INR 2.03 is therapeutic on admission.  Pharmacy is consulted to resume warfarin dosing 1/25.  Today, 01/31/2014:   INR 2.42, therapeutic and increased despite holding warfarin 1/24  CBC: Hgb 12.7, Plt 234  Rn noted 1/25 0340  AM that pt had smear of reddish stool, but not enough to collect.  FOB pending.  MD does not report any other bleeding or complications.  Diet: regular  Drug-drug interactions: Cipro  CT (1/24) negative for acute findings.   Goal of Therapy:  INR 2-3 Monitor platelets by anticoagulation protocol: Yes   Plan:   Hold warfarin tonight d/t rising INR, drug interactions, and decreasing Hgb.  Daily INR  Gretta Arab PharmD, BCPS Pager [phone] 01/31/2014 1:12 PM

## 2014-01-31 NOTE — Progress Notes (Signed)
Pt refused CPAP for tonight. Pt states the nasal mask provided by the hospital is uncomfortable. Pt was encouraged to bring home nasal pillows if she is not discharged tomorrow. RT will continue to monitor as needed.

## 2014-01-31 NOTE — Progress Notes (Signed)
Patient ID: Anna Farrell, female   DOB: 1927/01/28, 81 y.o.   MRN: [id number]  [facility] PROGRESS NOTE  Anna Farrell PYP:[id number] DOB: Jan 12, 1927 DOA: 01/30/2014 PCP: [name], MD   Brief narrative:    81 y.o. female with HTN, HLD, IDA, colitis in 2013, a-fib requiring Coumadin, presented with 2-3 days duration of progressively worsening lower quadrants abdominal pain, constant and throbbing, 7/10 in severity, non radiating, associated with nausea and poor oral intake, watery diarrhea. UA suggestive of UTI.   Assessment/Plan:    Active Problems:   Abd pain with nausea  - UA suggestive of UTI - continue Cipro day #2 - follow up on urine culture    Essential hypertension - reasonable inpatient control  - continue Lisinopril and Metoprolol    HLD - continue statin    Chronic atrial fibrillation - rate controlled - continue Coumadin per pharmacy    Cardiac pacemaker in situ   Hypokalemia - supplemented, repeat BMP in AM   OSA (obstructive sleep apnea)   Blood in the stool - Hg stable, will ask for FOBT   Abnormal ECG - denies chest pain this AM - repeat ECG if pt develops chest pain  - monitor on tele for now  Code Status: Full.  Family Communication:  plan of care discussed with the patient Disposition Plan: Home when stable.   IV access:  Peripheral IV  Procedures and diagnostic studies:    Ct Abdomen Pelvis Wo Contrast  01/30/2014   No acute findings. 2. No findings to explain this patient's symptoms. 3. Chronic findings include changes from a cholecystectomy and hysterectomy, atherosclerotic changes throughout the aorta, changes from a left hip arthroplasty and vertebroplasty of a fracture of L1.     Medical Consultants:  None   Other Consultants:  None  IAnti-Infectives:   Cipro 1/24 -->  Faye Ramsay, MD  [facility] Pager [phone]  If 7PM-7AM, please contact night-coverage [email or web address] Password [facility] 01/31/2014, 12:41 PM   LOS: 1 day    HPI/Subjective: No events overnight.   Objective: Filed Vitals:   01/31/14 8338 01/31/14 0541 01/31/14 0543 01/31/14 0835  BP: 151/94 150/95 132/95   Pulse: 80 86 105   Temp: 97.9 F (36.6 C)     TempSrc: Oral     Resp: 20  16   Height:      Weight:      SpO2: 98%  97% 97%    Intake/Output Summary (Last 24 hours) at 01/31/14 1241 Last data filed at 01/31/14 0851  Gross per 24 hour  Intake   1160 ml  Output    800 ml  Net    360 ml    Exam:   General:  Pt is alert, follows commands appropriately, not in acute distress  Cardiovascular: Regular rate and rhythm, no rubs, no gallops  Respiratory: Clear to auscultation bilaterally, no wheezing, no crackles, no rhonchi  Abdomen: Soft, tender in lower abd quadrants, non distended, bowel sounds present, no guarding  Extremities: No edema, pulses DP and PT palpable bilaterally  Neuro: Grossly nonfocal  Data Reviewed: Basic Metabolic Panel:  Recent Labs Lab 01/30/14 1415 01/31/14 0214  NA 136 135  K 3.4* 3.2*  CL 101 102  CO2 26 26  GLUCOSE 154* 126*  BUN 14 13  CREATININE 0.83 0.70  CALCIUM 9.5 8.8  MG  --  1.9  PHOS  --  3.0   Liver Function Tests:  Recent Labs Lab 01/30/14 1415 01/31/14 0214  AST 25  22  ALT 16 13  ALKPHOS 126* 97  BILITOT 0.8 0.9  PROT 7.1 6.3  ALBUMIN 3.8 3.3*    Recent Labs Lab 01/30/14 1415  LIPASE 25   CBC:  Recent Labs Lab 01/30/14 1415 01/30/14 2000 01/31/14 0214 01/31/14 0736  WBC 10.9* 10.7* 11.8* 10.8*  NEUTROABS 9.3*  --   --   --   HGB 14.8 13.5 13.4 12.7  HCT 46.8* 43.7 42.7 40.3  MCV 82.0 82.6 82.0 81.7  PLT 261 279 251 234   Cardiac Enzymes:  Recent Labs Lab 01/30/14 1940 01/31/14 0140 01/31/14 0735  CKTOTAL 44  --   --   TROPONINI <0.03 <0.03 <0.03   Scheduled Meds: . ciprofloxacin  400 mg Intravenous Q12H  . fluticasone  1 puff Inhalation BID  . latanoprost  1 drop Both Eyes QHS  . lisinopril  10 mg Oral Daily  . metoprolol  succinate  50 mg Oral BID  . pravastatin  40 mg Oral Daily  . rOPINIRole  0.25 mg Oral QHS  . sodium chloride  3 mL Intravenous Q12H  . traMADol  100 mg Oral BID   Continuous Infusions: . sodium chloride 75 mL/hr at 01/31/14 1013

## 2014-01-31 NOTE — Progress Notes (Signed)
Pt had smear of reddish stool. Not enough to collect specimen

## 2014-01-31 NOTE — Progress Notes (Signed)
CARE MANAGEMENT NOTE 01/31/2014  Patient:  Anna Farrell, Anna Farrell   Account Number:  [id number]  Date Initiated:  01/31/2014  Documentation initiated by:  Karl Bales  Subjective/Objective Assessment:   Pt admitted with cco UTI, N, V     Action/Plan:   from home   Anticipated DC Date:  02/02/2014   Anticipated DC Plan:  [place]  CM consult      Choice offered to / List presented to:             Status of service:  In process, will continue to follow Medicare Important Message given?   (If response is "NO", the following Medicare IM given date fields will be blank) Date Medicare IM given:   Medicare IM given by:   Date Additional Medicare IM given:   Additional Medicare IM given by:    Discharge Disposition:    Per UR Regulation:  Reviewed for med. necessity/level of care/duration of stay  If discussed at [place] of Stay Meetings, dates discussed:    Comments:  01/31/14 MMcGibboney, RN, BSN Chart reviewed.

## 2014-02-01 LAB — CBC
HEMATOCRIT: 36.5 % (ref 36.0–46.0)
HEMOGLOBIN: 11.1 g/dL — AB (ref 12.0–15.0)
MCH: 25.5 pg — AB (ref 26.0–34.0)
MCHC: 30.4 g/dL (ref 30.0–36.0)
MCV: 83.9 fL (ref 78.0–100.0)
PLATELETS: 218 10*3/uL (ref 150–400)
RBC: 4.35 MIL/uL (ref 3.87–5.11)
RDW: 16.8 % — ABNORMAL HIGH (ref 11.5–15.5)
WBC: 11.2 10*3/uL — AB (ref 4.0–10.5)

## 2014-02-01 LAB — BASIC METABOLIC PANEL
Anion gap: 4 — ABNORMAL LOW (ref 5–15)
BUN: 10 mg/dL (ref 6–23)
CALCIUM: 8.6 mg/dL (ref 8.4–10.5)
CHLORIDE: 106 mmol/L (ref 96–112)
CO2: 27 mmol/L (ref 19–32)
Creatinine, Ser: 0.72 mg/dL (ref 0.50–1.10)
GFR calc Af Amer: 89 mL/min — ABNORMAL LOW (ref >90)
GFR calc non Af Amer: 77 mL/min — ABNORMAL LOW (ref >90)
Glucose, Bld: 118 mg/dL — ABNORMAL HIGH (ref 70–99)
Potassium: 3.9 mmol/L (ref 3.5–5.1)
Sodium: 137 mmol/L (ref 135–145)

## 2014-02-01 LAB — PROTIME-INR
INR: 1.82 — ABNORMAL HIGH (ref 0.00–1.49)
Prothrombin Time: 21.3 seconds — ABNORMAL HIGH (ref 11.6–15.2)

## 2014-02-01 MED ORDER — CIPROFLOXACIN HCL 500 MG PO TABS: 500.0000 mg | ORAL_TABLET | Freq: Two times a day (BID) | ORAL | Status: DC

## 2014-02-01 MED ORDER — WARFARIN SODIUM 3 MG PO TABS: ORAL_TABLET | ORAL | Status: DC

## 2014-02-01 MED ORDER — WARFARIN SODIUM 3 MG PO TABS
3.0000 mg | ORAL_TABLET | Freq: Once | ORAL | Status: DC
  Filled 2014-02-01: qty 1

## 2014-02-01 NOTE — Discharge Instructions (Signed)

## 2014-02-01 NOTE — Discharge Summary (Signed)
Physician Discharge Summary  Anna Farrell:[id number] DOB: 1927-12-30 DOA: 01/30/2014  PCP: [name], MD  Admit date: 01/30/2014 Discharge date: 02/01/2014  Recommendations for Outpatient Follow-up:  1. Pt will need to follow up with PCP in 2-3 weeks post discharge 2. Please obtain BMP to evaluate electrolytes and kidney function 3. Please also check CBC to evaluate Hg and Hct levels   Discharge Diagnoses:  Active Problems:   Essential hypertension   Chronic atrial fibrillation   Cardiac pacemaker in situ   Hypokalemia   OSA (obstructive sleep apnea)   Vomiting   UTI (lower urinary tract infection)   Dehydration   Blood in the stool   Abnormal ECG    Discharge Condition: Stable  Diet recommendation: Heart healthy diet discussed in details     Brief narrative:    81 y.o. female with HTN, HLD, IDA, colitis in 2013, a-fib requiring Coumadin, presented with 2-3 days duration of progressively worsening lower quadrants abdominal pain, constant and throbbing, 7/10 in severity, non radiating, associated with nausea and poor oral intake, watery diarrhea. UA suggestive of UTI.   Assessment/Plan:    Active Problems:  Abd pain with nausea  - UA suggestive of UTI - continue Cipro day #3 and will also need to continue upon discharge to complete therapy   Essential hypertension - reasonable inpatient control  - continue Lisinopril and Metoprolol   HLD - continue statin   Chronic atrial fibrillation - rate controlled - continue Coumadin, pt made aware of need for PT/INR check   Cardiac pacemaker in situ  Hypokalemia - supplemented  OSA (obstructive sleep apnea)  Blood in the stool - Hg stable  Abnormal ECG - denies chest pain this AM  Code Status: Full.  Family Communication: plan of care discussed with the patient Disposition Plan: Home   IV access:  Peripheral IV  Procedures and diagnostic studies:   Ct Abdomen Pelvis Wo  Contrast 01/30/2014 No acute findings. 2. No findings to explain this patient's symptoms. 3. Chronic findings include changes from a cholecystectomy and hysterectomy, atherosclerotic changes throughout the aorta, changes from a left hip arthroplasty and vertebroplasty of a fracture of L1.   Medical Consultants:  None   Other Consultants:  None  IAnti-Infectives:   Cipro 1/24 -->   Discharge Exam: Filed Vitals:   02/01/14 0736  BP: 151/93  Pulse: 85  Temp: 98.2 F (36.8 C)  Resp: 18   Filed Vitals:   01/31/14 2034 01/31/14 2128 02/01/14 0736 02/01/14 0858  BP:  159/95 151/93   Pulse:  85 85   Temp:  97.8 F (36.6 C) 98.2 F (36.8 C)   TempSrc:  Oral Oral   Resp:  18 18   Height:      Weight:      SpO2: 97% 98% 98% 94%    General: Pt is alert, follows commands appropriately, not in acute distress Cardiovascular: Regular rate and rhythm, no rubs, no gallops Respiratory: Clear to auscultation bilaterally, no wheezing, no crackles, no rhonchi Abdominal: Soft, non tender, non distended, bowel sounds +, no guarding  Discharge Instructions     Medication List    TAKE these medications        beclomethasone 40 MCG/ACT inhaler  Commonly known as:  QVAR  Inhale 2 puffs into the lungs 2 (two) times daily as needed (breathing, shortness of breath).     ciprofloxacin 500 MG tablet  Commonly known as:  CIPRO  Take 1 tablet (500 mg total) by mouth  2 (two) times daily.     iron polysaccharides 150 MG capsule  Commonly known as:  NIFEREX  Take 1 capsule (150 mg total) by mouth daily with breakfast.     lisinopril 10 MG tablet  Commonly known as:  PRINIVIL,ZESTRIL  Take 10 mg by mouth daily.     metoprolol succinate 50 MG 24 hr tablet  Commonly known as:  TOPROL-XL  Take 1 tablet (50 mg total) by mouth 2 (two) times daily. Note increase in dose     nitroGLYCERIN 0.4 MG SL tablet  Commonly known as:  NITROSTAT  Place 1 tablet (0.4 mg total) under the  tongue every 5 (five) minutes as needed for chest pain.     pravastatin 40 MG tablet  Commonly known as:  PRAVACHOL  Take 1 tablet (40 mg total) by mouth daily. Once daily     rOPINIRole 0.25 MG tablet  Commonly known as:  REQUIP  Take 0.25 mg by mouth at bedtime. Takes 1-2 tablets     traMADol 50 MG tablet  Commonly known as:  ULTRAM  Take 1 tablet (50 mg total) by mouth every 6 (six) hours. For pain. Taper to three times a day in a few days. Then further to twice a day. Then as needed.     TRAVATAN Z 0.004 % Soln ophthalmic solution  Generic drug:  Travoprost (BAK Free)  Place 1 drop into both eyes daily.     vitamin B-12 100 MCG tablet  Commonly known as:  CYANOCOBALAMIN  Take 100 mcg by mouth once a week.     Vitamin D3 1000 UNITS Caps  Take 2 capsules by mouth daily.     warfarin 3 MG tablet  Commonly known as:  COUMADIN  Take 1.5 mg tablet daily, have PT/INR checked on Thursday January 28th, 2016            Follow-up Information    Follow up with [name], MD.   Specialty:  Family Medicine   Contact information:   [address] [place] [address] [phone]        The results of significant diagnostics from this hospitalization (including imaging, microbiology, ancillary and laboratory) are listed below for reference.     Microbiology: Recent Results (from the past 240 hour(s))  Urine culture     Status: None (Preliminary result)   Collection Time: 01/30/14  3:22 PM  Result Value Ref Range Status   Specimen Description URINE, CLEAN CATCH  Final   Special Requests NONE  Final   Colony Count   Final    >=100,000 COLONIES/ML Performed at [facility]    Culture   Final    [place] Performed at [facility]    Report Status PENDING  Incomplete     Labs: Basic Metabolic Panel:  Recent Labs Lab 01/30/14 1415 01/31/14 0214 02/01/14 0530  NA 136 135 137  K 3.4* 3.2* 3.9  CL 101 102 106   CO2 26 26 27   GLUCOSE 154* 126* 118*  BUN 14 13 10   CREATININE 0.83 0.70 0.72  CALCIUM 9.5 8.8 8.6  MG  --  1.9  --   PHOS  --  3.0  --    Liver Function Tests:  Recent Labs Lab 01/30/14 1415 01/31/14 0214  AST 25 22  ALT 16 13  ALKPHOS 126* 97  BILITOT 0.8 0.9  PROT 7.1 6.3  ALBUMIN 3.8 3.3*    Recent Labs Lab 01/30/14  1415  LIPASE 25   No results for input(s): AMMONIA in the last 168 hours. CBC:  Recent Labs Lab 01/30/14 1415 01/30/14 2000 01/31/14 0214 01/31/14 0736 02/01/14 0530  WBC 10.9* 10.7* 11.8* 10.8* 11.2*  NEUTROABS 9.3*  --   --   --   --   HGB 14.8 13.5 13.4 12.7 11.1*  HCT 46.8* 43.7 42.7 40.3 36.5  MCV 82.0 82.6 82.0 81.7 83.9  PLT 261 279 251 234 218   Cardiac Enzymes:  Recent Labs Lab 01/30/14 1940 01/31/14 0140 01/31/14 0735  CKTOTAL 44  --   --   TROPONINI <0.03 <0.03 <0.03   BNP: BNP (last 3 results)  Recent Labs  10/12/13 1609  PROBNP 127.0*   CBG: No results for input(s): GLUCAP in the last 168 hours.   SIGNED: Time coordinating discharge: Over 30 minutes  Faye Ramsay, MD  [facility] 02/01/2014, 10:16 AM Pager [phone]  If 7PM-7AM, please contact night-coverage [email or web address] Password [email or web address]

## 2014-02-01 NOTE — Progress Notes (Addendum)
ANTICOAGULATION CONSULT NOTE - Initial Consult  Pharmacy Consult for Warfarin Indication: atrial fibrillation  Allergies  Allergen Reactions  . Betadine [Povidone Iodine]     burning  . Codeine Nausea And Vomiting    Passed out  . Rocephin [Ceftriaxone Sodium In Dextrose] Itching    Patient Measurements: Height: 5\' 2"  (157.5 cm) Weight: 130 lb 11.7 oz (59.3 kg) IBW/kg (Calculated) : 50.1  Vital Signs: Temp: 98.2 F (36.8 C) (01/26 0736) Temp Source: Oral (01/26 0736) BP: 151/93 mmHg (01/26 0736) Pulse Rate: 85 (01/26 0736)  Labs:  Recent Labs  01/30/14 1415 01/30/14 1556 01/30/14 1940  01/31/14 0140 01/31/14 0214 01/31/14 0735 01/31/14 0736 01/31/14 1407 02/01/14 0530  HGB 14.8  --   --   < >  --  13.4  --  12.7  --  11.1*  HCT 46.8*  --   --   < >  --  42.7  --  40.3  --  36.5  PLT 261  --   --   < >  --  251  --  234  --  218  LABPROT  --  23.1*  --   --   --   --   --   --  26.6*  --   INR  --  2.03*  --   --   --   --   --   --  2.42*  --   CREATININE 0.83  --   --   --   --  0.70  --   --   --  0.72  CKTOTAL  --   --  44  --   --   --   --   --   --   --   TROPONINI  --   --  <0.03  --  <0.03  --  <0.03  --   --   --   < > = values in this interval not displayed.  Estimated Creatinine Clearance: 41.4 mL/min (by C-G formula based on Cr of 0.72).   Medications:  Scheduled:  . ciprofloxacin  400 mg Intravenous Q12H  . fluticasone  1 puff Inhalation BID  . latanoprost  1 drop Both Eyes QHS  . lisinopril  10 mg Oral Daily  . metoprolol succinate  50 mg Oral BID  . pravastatin  40 mg Oral Daily  . rOPINIRole  0.25 mg Oral QHS  . sodium chloride  3 mL Intravenous Q12H  . traMADol  100 mg Oral BID  . Warfarin - Pharmacist Dosing Inpatient   Does not apply q1800    Assessment: Anna Farrell admitted 1/24 with abdominal pain, N/V/D notable for a streak of bright red blood in stool.  PMH includes atrial fibrillation on chronic warfarin.  Reported home warfarin  dose is 3mg  daily except 4.5mg  on Thursdays (last dose 1/23).  INR 2.03 is therapeutic on admission.  Pharmacy is consulted to resume warfarin dosing 1/25.  Today, 02/01/2014:   INR 1.82, decreased to subtherapeutic.  CBC: Hgb continued decrease to 11.1, Plt 218.  Rn noted 1/25 0340 AM that pt had smear of reddish stool, but not enough to collect.  FOB pending.  MD does not report any other bleeding or complications.  Diet: regular  Drug-drug interactions: Cipro  CT abdomen (1/24) negative for acute findings.   Goal of Therapy:  INR 2-3 Monitor platelets by anticoagulation protocol: Yes   Plan:   Warfarin 3 mg PO today at 1800 if  not discharged.   For discharge:  recommend resuming warfarin 1.5 mg daily (due to strong interaction with CIPRO) and recheck INR on Friday, 1/29.  Gretta Arab PharmD, BCPS Pager [phone] 02/01/2014 8:42 AM

## 2014-02-02 LAB — FECAL LACTOFERRIN, QUANT: Fecal Lactoferrin: POSITIVE

## 2014-02-02 LAB — URINE CULTURE

## 2014-02-03 ENCOUNTER — Ambulatory Visit (INDEPENDENT_AMBULATORY_CARE_PROVIDER_SITE_OTHER): Payer: Medicare Other | Admitting: *Deleted

## 2014-02-03 DIAGNOSIS — I482 Chronic atrial fibrillation, unspecified: Secondary | ICD-10-CM

## 2014-02-03 DIAGNOSIS — Z5181 Encounter for therapeutic drug level monitoring: Secondary | ICD-10-CM

## 2014-02-03 LAB — POCT INR: INR: 1.3

## 2014-02-05 LAB — STOOL CULTURE: Special Requests: NORMAL

## 2014-02-11 DIAGNOSIS — N390 Urinary tract infection, site not specified: Secondary | ICD-10-CM | POA: Diagnosis not present

## 2014-02-11 DIAGNOSIS — I119 Hypertensive heart disease without heart failure: Secondary | ICD-10-CM | POA: Diagnosis not present

## 2014-02-11 DIAGNOSIS — R197 Diarrhea, unspecified: Secondary | ICD-10-CM | POA: Diagnosis not present

## 2014-02-11 DIAGNOSIS — M150 Primary generalized (osteo)arthritis: Secondary | ICD-10-CM | POA: Diagnosis not present

## 2014-02-11 DIAGNOSIS — I482 Chronic atrial fibrillation: Secondary | ICD-10-CM | POA: Diagnosis not present

## 2014-02-11 DIAGNOSIS — D72829 Elevated white blood cell count, unspecified: Secondary | ICD-10-CM | POA: Diagnosis not present

## 2014-02-11 DIAGNOSIS — E860 Dehydration: Secondary | ICD-10-CM | POA: Diagnosis not present

## 2014-02-11 DIAGNOSIS — D509 Iron deficiency anemia, unspecified: Secondary | ICD-10-CM | POA: Diagnosis not present

## 2014-02-11 DIAGNOSIS — J4530 Mild persistent asthma, uncomplicated: Secondary | ICD-10-CM | POA: Diagnosis not present

## 2014-02-11 DIAGNOSIS — Z7901 Long term (current) use of anticoagulants: Secondary | ICD-10-CM | POA: Diagnosis not present

## 2014-02-11 DIAGNOSIS — G4733 Obstructive sleep apnea (adult) (pediatric): Secondary | ICD-10-CM | POA: Diagnosis not present

## 2014-02-14 ENCOUNTER — Ambulatory Visit (INDEPENDENT_AMBULATORY_CARE_PROVIDER_SITE_OTHER): Payer: Medicare Other

## 2014-02-14 DIAGNOSIS — I482 Chronic atrial fibrillation, unspecified: Secondary | ICD-10-CM

## 2014-02-14 DIAGNOSIS — Z5181 Encounter for therapeutic drug level monitoring: Secondary | ICD-10-CM | POA: Diagnosis not present

## 2014-02-14 LAB — POCT INR: INR: 3

## 2014-02-24 DIAGNOSIS — I119 Hypertensive heart disease without heart failure: Secondary | ICD-10-CM | POA: Diagnosis not present

## 2014-02-24 DIAGNOSIS — M549 Dorsalgia, unspecified: Secondary | ICD-10-CM | POA: Diagnosis not present

## 2014-02-24 DIAGNOSIS — I482 Chronic atrial fibrillation: Secondary | ICD-10-CM | POA: Diagnosis not present

## 2014-03-01 ENCOUNTER — Emergency Department (HOSPITAL_COMMUNITY): Payer: Medicare Other

## 2014-03-01 ENCOUNTER — Ambulatory Visit (INDEPENDENT_AMBULATORY_CARE_PROVIDER_SITE_OTHER): Payer: Medicare Other | Admitting: *Deleted

## 2014-03-01 ENCOUNTER — Encounter (HOSPITAL_COMMUNITY): Payer: Self-pay | Admitting: Emergency Medicine

## 2014-03-01 ENCOUNTER — Emergency Department (HOSPITAL_COMMUNITY)
Admission: EM | Admit: 2014-03-01 | Discharge: 2014-03-01 | Disposition: A | Discharge status: Home / Self Care | Payer: Medicare Other | Attending: Emergency Medicine | Admitting: Emergency Medicine

## 2014-03-01 ENCOUNTER — Other Ambulatory Visit: Payer: Self-pay

## 2014-03-01 ENCOUNTER — Ambulatory Visit (INDEPENDENT_AMBULATORY_CARE_PROVIDER_SITE_OTHER): Payer: Medicare Other | Admitting: Physician Assistant

## 2014-03-01 ENCOUNTER — Encounter: Payer: Self-pay | Admitting: Physician Assistant

## 2014-03-01 VITALS — BP 160/102 | HR 82 | Ht 62.0 in

## 2014-03-01 DIAGNOSIS — I10 Essential (primary) hypertension: Secondary | ICD-10-CM

## 2014-03-01 DIAGNOSIS — I482 Chronic atrial fibrillation, unspecified: Secondary | ICD-10-CM

## 2014-03-01 DIAGNOSIS — R0789 Other chest pain: Secondary | ICD-10-CM

## 2014-03-01 DIAGNOSIS — R079 Chest pain, unspecified: Secondary | ICD-10-CM | POA: Diagnosis not present

## 2014-03-01 DIAGNOSIS — Z7951 Long term (current) use of inhaled steroids: Secondary | ICD-10-CM | POA: Insufficient documentation

## 2014-03-01 DIAGNOSIS — Z8619 Personal history of other infectious and parasitic diseases: Secondary | ICD-10-CM | POA: Diagnosis not present

## 2014-03-01 DIAGNOSIS — Z950 Presence of cardiac pacemaker: Secondary | ICD-10-CM

## 2014-03-01 DIAGNOSIS — G8929 Other chronic pain: Secondary | ICD-10-CM | POA: Diagnosis not present

## 2014-03-01 DIAGNOSIS — Z7901 Long term (current) use of anticoagulants: Secondary | ICD-10-CM | POA: Diagnosis not present

## 2014-03-01 DIAGNOSIS — J9691 Respiratory failure, unspecified with hypoxia: Secondary | ICD-10-CM | POA: Diagnosis not present

## 2014-03-01 DIAGNOSIS — K921 Melena: Secondary | ICD-10-CM

## 2014-03-01 DIAGNOSIS — Z5181 Encounter for therapeutic drug level monitoring: Secondary | ICD-10-CM | POA: Diagnosis not present

## 2014-03-01 DIAGNOSIS — J45909 Unspecified asthma, uncomplicated: Secondary | ICD-10-CM | POA: Insufficient documentation

## 2014-03-01 DIAGNOSIS — Z862 Personal history of diseases of the blood and blood-forming organs and certain disorders involving the immune mechanism: Secondary | ICD-10-CM | POA: Insufficient documentation

## 2014-03-01 DIAGNOSIS — E785 Hyperlipidemia, unspecified: Secondary | ICD-10-CM | POA: Insufficient documentation

## 2014-03-01 DIAGNOSIS — Z86010 Personal history of colonic polyps: Secondary | ICD-10-CM | POA: Diagnosis not present

## 2014-03-01 DIAGNOSIS — G4733 Obstructive sleep apnea (adult) (pediatric): Secondary | ICD-10-CM

## 2014-03-01 DIAGNOSIS — Z79899 Other long term (current) drug therapy: Secondary | ICD-10-CM | POA: Insufficient documentation

## 2014-03-01 DIAGNOSIS — I4891 Unspecified atrial fibrillation: Secondary | ICD-10-CM | POA: Diagnosis not present

## 2014-03-01 LAB — CBC
HEMATOCRIT: 39.5 % (ref 36.0–46.0)
HEMOGLOBIN: 12.3 g/dL (ref 12.0–15.0)
MCH: 25.2 pg — ABNORMAL LOW (ref 26.0–34.0)
MCHC: 31.1 g/dL (ref 30.0–36.0)
MCV: 80.8 fL (ref 78.0–100.0)
Platelets: 240 10*3/uL (ref 150–400)
RBC: 4.89 MIL/uL (ref 3.87–5.11)
RDW: 16 % — ABNORMAL HIGH (ref 11.5–15.5)
WBC: 6 10*3/uL (ref 4.0–10.5)

## 2014-03-01 LAB — COMPREHENSIVE METABOLIC PANEL
ALT: 13 U/L (ref 0–35)
AST: 17 U/L (ref 0–37)
Albumin: 3.4 g/dL — ABNORMAL LOW (ref 3.5–5.2)
Alkaline Phosphatase: 75 U/L (ref 39–117)
Anion gap: 6 (ref 5–15)
BUN: 17 mg/dL (ref 6–23)
CALCIUM: 9.3 mg/dL (ref 8.4–10.5)
CO2: 27 mmol/L (ref 19–32)
Chloride: 104 mmol/L (ref 96–112)
Creatinine, Ser: 0.84 mg/dL (ref 0.50–1.10)
GFR calc Af Amer: 72 mL/min — ABNORMAL LOW (ref >90)
GFR, EST NON AFRICAN AMERICAN: 62 mL/min — AB (ref >90)
GLUCOSE: 99 mg/dL (ref 70–99)
Potassium: 3.8 mmol/L (ref 3.5–5.1)
SODIUM: 137 mmol/L (ref 135–145)
Total Bilirubin: 0.7 mg/dL (ref 0.3–1.2)
Total Protein: 5.9 g/dL — ABNORMAL LOW (ref 6.0–8.3)

## 2014-03-01 LAB — I-STAT TROPONIN, ED: Troponin i, poc: 0 ng/mL (ref 0.00–0.08)

## 2014-03-01 LAB — POCT INR: INR: 2.8

## 2014-03-01 NOTE — Patient Instructions (Signed)
YOU HAVE BEEN SENT TO [facility] ED

## 2014-03-01 NOTE — ED Notes (Signed)
EDP at bedside  

## 2014-03-01 NOTE — Discharge Instructions (Signed)

## 2014-03-01 NOTE — ED Provider Notes (Signed)
CSN: [id number]     Arrival date & time 03/01/14  1820 History   First MD Initiated Contact with Patient 03/01/14 1831     Chief Complaint  Patient presents with  . Chest Pain     (Consider location/radiation/quality/duration/timing/severity/associated sxs/prior Treatment) HPI The patient had just left her doctor's office getting her blood work done for Coumadin monitoring and was sitting in a chair waiting for her son to come back, when she developed central chest pain that felt very uncomfortable. She reports she also felt "unwell" with the pain. She denies however that she was short of breath or sweaty. She also denies that she was nauseated. This pain lasted approximately 30 minutes. She was given aspirin at her doctor's office prior to coming to the emergency department. At this time the pain is completely resolved and she is asymptomatic. Past Medical History  Diagnosis Date  . Vasovagal syncope   . Hypertension   . Hyperlipidemia   . Orthostatic hypotension   . Osteoporosis   . Pacemaker 10/11/06    implanted by Dr Leonia Reeves for pauses > 4 seconds with afib  . Iron deficiency anemia   . Colitis 5/13  . Chronic low back pain 2008    crushed vertebrae  . Tachycardia-bradycardia syndrome   . Permanent atrial fibrillation   . Asymptomatic PVCs   . History of colonic polyps   . Extrinsic asthma, unspecified     LOV Dr Joya Gaskins 7/12  . Long term (current) use of anticoagulants   . Cholelithiasis   . Generalized osteoarthrosis, unspecified site   . Pancreatitis    Past Surgical History  Procedure Laterality Date  . Vesicovaginal fistula closure w/ tah  1981  . Pacemaker insertion  10/11/2006    SJM Zephyr XL DR implanted by Dr Leonia Reeves for afib with pause > 4 seconds  . Colonoscopy    . Appendectomy    . Abdominal hysterectomy    . Cholecystectomy  09/17/2011    Procedure: LAPAROSCOPIC CHOLECYSTECTOMY;  Surgeon: Rolm Bookbinder, MD;  Location: [facility];  Service: General;;  .  Cardiac catheterization  2009    Normal coronary arteries  . Ercp N/A 04/07/2013    Procedure: ENDOSCOPIC RETROGRADE CHOLANGIOPANCREATOGRAPHY (ERCP);  Surgeon: Jeryl Columbia, MD;  Location: [facility];  Service: Endoscopy;  Laterality: N/A;  . Eus  04/07/2013    Procedure: UPPER ENDOSCOPIC ULTRASOUND (EUS) RADIAL;  Surgeon: Jeryl Columbia, MD;  Location: [facility];  Service: Endoscopy;;  . Hip arthroplasty Left 08/17/2013    Procedure: ARTHROPLASTY BIPOLAR HIP;  Surgeon: Renette Butters, MD;  Location: [facility];  Service: Orthopedics;  Laterality: Left;   Family History  Problem Relation Age of Onset  . Hypertension Mother   . Pancreatitis Mother    History  Substance Use Topics  . Smoking status: Never Smoker   . Smokeless tobacco: Never Used  . Alcohol Use: No   OB History    No data available     Review of Systems  10 Systems reviewed and are negative for acute change except as noted in the HPI.   Allergies  Betadine; Codeine; and Rocephin  Home Medications   Prior to Admission medications   Medication Sig Start Date End Date Taking? Authorizing Provider  beclomethasone (QVAR) 40 MCG/ACT inhaler Inhale 2 puffs into the lungs 2 (two) times daily as needed (breathing, shortness of breath).   Yes Historical Provider, MD  Cholecalciferol (VITAMIN D3) 1000 UNITS CAPS Take 2 capsules by mouth daily.  Yes Historical Provider, MD  lisinopril (PRINIVIL,ZESTRIL) 20 MG tablet Take 20 mg by mouth daily.   Yes Historical Provider, MD  metoprolol succinate (TOPROL-XL) 50 MG 24 hr tablet Take 1 tablet (50 mg total) by mouth 2 (two) times daily. Note increase in dose 08/31/13  Yes Ivan Anchors Love, PA-C  nitrofurantoin, macrocrystal-monohydrate, (MACROBID) 100 MG capsule 2 (two) times daily. 02/24/14  Yes Historical Provider, MD  nitroGLYCERIN (NITROSTAT) 0.4 MG SL tablet Place 1 tablet (0.4 mg total) under the tongue every 5 (five) minutes as needed for chest pain. 04/16/13  Yes Thompson Grayer, MD   pravastatin (PRAVACHOL) 40 MG tablet Take 1 tablet (40 mg total) by mouth daily. Once daily 07/08/13  Yes Sueanne Margarita, MD  rOPINIRole (REQUIP) 0.25 MG tablet Take 0.25 mg by mouth at bedtime. Takes 1-2 tablets   Yes Historical Provider, MD  traMADol (ULTRAM) 50 MG tablet Take 100 mg by mouth 2 (two) times daily.   Yes Historical Provider, MD  TRAVATAN Z 0.004 % SOLN ophthalmic solution Place 1 drop into both eyes daily. 01/04/13  Yes Historical Provider, MD  vitamin B-12 (CYANOCOBALAMIN) 100 MCG tablet Take 100 mcg by mouth once a week.   Yes Historical Provider, MD  warfarin (COUMADIN) 3 MG tablet Take 1.5 mg tablet daily, have PT/INR checked on Thursday January 28th, 2016 Patient taking differently: 3-4.5 mg. Take 3 mg on all days except, take 4.5 on Thursday ONLY 02/01/14  Yes Theodis Blaze, MD   BP 136/84 mmHg  Pulse 84  Temp(Src) 98.3 F (36.8 C) (Oral)  Resp 16  Ht 5\' 2"  (1.575 m)  Wt 130 lb (58.968 kg)  BMI 23.77 kg/m2  SpO2 96% Physical Exam  Constitutional: She is oriented to person, place, and time. She appears well-developed and well-nourished.  HENT:  Head: Normocephalic and atraumatic.  Eyes: EOM are normal. Pupils are equal, round, and reactive to light.  Neck: Neck supple.  Cardiovascular: Normal rate, normal heart sounds and intact distal pulses.   Heart is irregularly irregular  Pulmonary/Chest: Effort normal and breath sounds normal.  Abdominal: Soft. Bowel sounds are normal. She exhibits no distension. There is no tenderness.  Musculoskeletal: Normal range of motion. She exhibits no edema.  Neurological: She is alert and oriented to person, place, and time. She has normal strength. Coordination normal. GCS eye subscore is 4. GCS verbal subscore is 5. GCS motor subscore is 6.  Skin: Skin is warm, dry and intact.  Psychiatric: She has a normal mood and affect.    ED Course  Procedures (including critical care time) Labs Review Labs Reviewed  CBC - Abnormal;  Notable for the following:    MCH 25.2 (*)    RDW 16.0 (*)    All other components within normal limits  COMPREHENSIVE METABOLIC PANEL - Abnormal; Notable for the following:    Total Protein 5.9 (*)    Albumin 3.4 (*)    GFR calc non Af Amer 62 (*)    GFR calc Af Amer 72 (*)    All other components within normal limits  I-STAT TROPOININ, ED    Imaging Review Dg Chest 2 View  03/01/2014   CLINICAL DATA:  Chest pain for 1 day, history of hypertension, atrial fibrillation, respiratory failure with hypoxia.  EXAM: CHEST  2 VIEW  COMPARISON:  Chest radiograph August 23, 2013 and CT of the abdomen and pelvis January 30, 2014  FINDINGS: The cardiac silhouette is mild to moderately enlarged, unchanged. Tortuous calcified  aorta. No pleural effusion or focal consolidation. Lead LEFT cardiac pacemaker in situ. Increased lung volumes with flattened hemidiaphragms can be seen with COPD. No pneumothorax.  Osteopenia, status post approximate L1 vertebral bodies cement augmentation. Moderate L2 compression fracture, unchanged.  IMPRESSION: No active cardiopulmonary disease.   Electronically Signed   By: Elon Alas   On: 03/01/2014 21:36     EKG Interpretation None      MDM   Final diagnoses:  Other chest pain   Patient has had no recurrence of chest pain since the initial episode. Cardiac enzymes are negative. Patient has chronic H of fibrillation. She is on cardiac medications and anticoagulated with Coumadin. She was at her cardiologist's office when the symptoms started today. At this point is that she is safe for discharge and follow-up with her cardiologist for further diagnostic tests if indicated.    Charlesetta Shanks, MD 03/02/14 [phone]

## 2014-03-01 NOTE — ED Notes (Signed)
Pt denies chest pain. NAD noted. Pt given discharge instructions. Pt dioscharged by whweelchair

## 2014-03-01 NOTE — Progress Notes (Signed)
Cardiology Office Note   Date:  03/01/2014   ID:  ROYALTI SCHAUF, DOB [birth year], MRN [id number]  PCP:  [name], MD  Cardiologist:  Dr. Fransico Him   Electrophysiologist:  Dr. Thompson Grayer   Chief Complaint  Patient presents with  . Chest Pain     History of Present Illness: Anna Farrell is a 81 y.o. female with a hx of chronic AFib, orthostatic hypotension, OSA, HTN, HL, bradycardia s/p PPM.  Last seen by Dr. Fransico Him 10/2013.  Admitted 01/2014 with a UTI. Patient seen in Coumadin clinic today. After leaving the office, she developed chest pain and came back. She was added on to my schedule for further evaluation.   She has not felt well since this past weekend.  She notes weakness and lightheadedness.  She started to have substernal chest tightness while getting off the elevator downstairs. This radiated up to her neck and she was somewhat short of breath.  She felt like it lasted about 30 minutes.  She did not take anything.  Symptoms are resolved now. She had another episode 2 weeks ago that only lasted 10 minutes.  She denies exertional chest pain.  She has chronic DOE.  She denies significant change recently.  She denies orthopnea, PND, edema.  She denies syncope.   Studies/Reports Reviewed Today:  Echo 05/2013 Study Conclusions  - Left ventricle: The cavity size was normal. Systolic function was normal. The estimated ejection fraction was in the range of 55% to 60%. Wall motion was normal; there were no regional wall motion abnormalities. - Aortic valve: Moderate thickening and calcification. - Aorta: Ascending aortic diameter: 60mm (S). - Ascending aorta: The ascending aorta was mildly dilated. - Mitral valve: Calcified annulus. - Left atrium: The atrium was mildly dilated.  Myoview 4/15 Overall Impression: Abnormal Myoview with a suggestion of a  moderate sized fixed septal defect, however the nature the defect as such it does not necessary follow a  strict pperfusion pattern. The lack of gated imaging to evaluate wall motion decreases the  effectiveness of interpretation of infarct versus artifact (image capturing artifact); LOW RISK Study.  Cardiac cath 06/2007 Normal coronary arteries    Past Medical History  Diagnosis Date  . Vasovagal syncope   . Hypertension   . Hyperlipidemia   . Orthostatic hypotension   . Osteoporosis   . Pacemaker 10/11/06    implanted by Dr Leonia Reeves for pauses > 4 seconds with afib  . Iron deficiency anemia   . Colitis 5/13  . Chronic low back pain 2008    crushed vertebrae  . Tachycardia-bradycardia syndrome   . Permanent atrial fibrillation   . Asymptomatic PVCs   . History of colonic polyps   . Extrinsic asthma, unspecified     LOV Dr Joya Gaskins 7/12  . Long term (current) use of anticoagulants   . Cholelithiasis   . Generalized osteoarthrosis, unspecified site   . Pancreatitis     Past Surgical History  Procedure Laterality Date  . Vesicovaginal fistula closure w/ tah  1981  . Pacemaker insertion  10/11/2006    SJM Zephyr XL DR implanted by Dr Leonia Reeves for afib with pause > 4 seconds  . Colonoscopy    . Appendectomy    . Abdominal hysterectomy    . Cholecystectomy  09/17/2011    Procedure: LAPAROSCOPIC CHOLECYSTECTOMY;  Surgeon: Rolm Bookbinder, MD;  Location: [facility];  Service: General;;  . Cardiac catheterization  2009    Normal coronary arteries  .  Ercp N/A 04/07/2013    Procedure: ENDOSCOPIC RETROGRADE CHOLANGIOPANCREATOGRAPHY (ERCP);  Surgeon: Jeryl Columbia, MD;  Location: [facility];  Service: Endoscopy;  Laterality: N/A;  . Eus  04/07/2013    Procedure: UPPER ENDOSCOPIC ULTRASOUND (EUS) RADIAL;  Surgeon: Jeryl Columbia, MD;  Location: [facility];  Service: Endoscopy;;  . Hip arthroplasty Left 08/17/2013    Procedure: ARTHROPLASTY BIPOLAR HIP;  Surgeon: Renette Butters, MD;  Location: [facility];  Service: Orthopedics;  Laterality: Left;     Current Outpatient Prescriptions    Medication Sig Dispense Refill  . beclomethasone (QVAR) 40 MCG/ACT inhaler Inhale 2 puffs into the lungs 2 (two) times daily as needed (breathing, shortness of breath).    . Cholecalciferol (VITAMIN D3) 1000 UNITS CAPS Take 2 capsules by mouth daily.     Marland Kitchen lisinopril (PRINIVIL,ZESTRIL) 20 MG tablet Take 20 mg by mouth daily.    . metoprolol succinate (TOPROL-XL) 50 MG 24 hr tablet Take 1 tablet (50 mg total) by mouth 2 (two) times daily. Note increase in dose 60 tablet 1  . nitrofurantoin, macrocrystal-monohydrate, (MACROBID) 100 MG capsule 2 (two) times daily.  0  . nitroGLYCERIN (NITROSTAT) 0.4 MG SL tablet Place 1 tablet (0.4 mg total) under the tongue every 5 (five) minutes as needed for chest pain. 90 tablet 3  . pravastatin (PRAVACHOL) 40 MG tablet Take 1 tablet (40 mg total) by mouth daily. Once daily 90 tablet 1  . rOPINIRole (REQUIP) 0.25 MG tablet Take 0.25 mg by mouth at bedtime. Takes 1-2 tablets    . traMADol (ULTRAM) 50 MG tablet Take 100 mg by mouth 2 (two) times daily.    . TRAVATAN Z 0.004 % SOLN ophthalmic solution Place 1 drop into both eyes daily.    . vitamin B-12 (CYANOCOBALAMIN) 100 MCG tablet Take 100 mcg by mouth once a week.    . warfarin (COUMADIN) 3 MG tablet Take 1.5 mg tablet daily, have PT/INR checked on Thursday January 28th, 2016 30 tablet 1   No current facility-administered medications for this visit.    Allergies:   Betadine; Codeine; and Rocephin    Social History:  The patient  reports that she has never smoked. She has never used smokeless tobacco. She reports that she does not drink alcohol or use illicit drugs.   Family History:  The patient's family history includes Hypertension in her mother; Pancreatitis in her mother.    ROS:   Please see the history of present illness.   Review of Systems  Constitution: Negative for chills and fever.  Respiratory: Positive for cough. Negative for wheezing.        Non-prod cough  Gastrointestinal: Positive  for diarrhea and hematochezia. Negative for melena and vomiting.       BRBPR noted during recent hospital stay.  No recurrence.  Genitourinary: Negative for dysuria and hematuria.       Recent UTI symptoms resolved.  All other systems reviewed and are negative.     PHYSICAL EXAM: VS:  BP 160/102 mmHg  Pulse 82  Ht 5\' 2"  (1.575 m)  SpO2 99%    Wt Readings from Last 3 Encounters:  01/30/14 130 lb 11.7 oz (59.3 kg)  10/12/13 132 lb (59.875 kg)  04/28/13 136 lb (61.689 kg)     GEN: Well nourished, well developed, in no acute distress HEENT: normal Neck: no JVD, no masses Cardiac:  Normal S1/S2, irregularly irregular rhythm; no murmur, no rubs or gallops, trace edema  Respiratory:  clear to  auscultation bilaterally, no wheezing, rhonchi or rales. GI: soft, nontender, nondistended, + BS MS: no deformity or atrophy Skin: warm and dry  Neuro:  CNs II-XII intact, Strength and sensation are intact Psych: Normal affect   EKG:  EKG is ordered today.  It demonstrates:   AFib, HR 82, NSSTTW changes, no change from prior tracing.   Recent Labs: 10/12/2013: Pro B Natriuretic peptide (BNP) 127.0* 01/31/2014: ALT 13; Magnesium 1.9; TSH 3.673 02/01/2014: BUN 10; Creatinine 0.72; Hemoglobin 11.1*; Platelets 218; Potassium 3.9; Sodium 137    Lipid Panel No results found for: CHOL, TRIG, HDL, CHOLHDL, VLDL, LDLCALC, LDLDIRECT    ASSESSMENT AND PLAN:  1.  Chest Pain:  She had prolonged chest pain today for 30 minutes with radiation to her neck.  ECG is without acute change.  BP is quite elevated.  She tells me that her BP often fluctuates like this.  She had a normal heart cath in 2009 and a low risk myoview in 04/2013.  She is therapeutic on Warfarin.  The likelihood that she has had a PE or MI is low.  However, her symptoms are concerning for angina.  Reviewed with Dr. Fransico Him who also saw the patient.    -  Send to the ED to have serial Troponins per ED protocol.  If she rules out and  has no further chest pain, anticipate that she could be DC to home tonight.    -  If she rules out and has no further CP, would anticipate that she would not need any further cardiac testing as she had a low risk myoview < 12 months ago. 2.  Chronic Atrial Fibrillation:  Rate controlled. Continue coumadin. 3.  HTN:  BP elevated today.  PCP recently adjusted medications.  She may need further titration of medications.  Suspect current elevation partly related to chest pain.   4.  OSA:  Continue CPAP. 5.  SSS s/p Pacer:  FU with EP as planned. 6.  Hematochezia:  No recurrence.  FU with PCP for evaluation.   Current medicines are reviewed at length with the patient today.  The patient does not have concerns regarding medicines.  The following changes have been made:  As above.   Labs/ tests ordered today include:   Orders Placed This Encounter  Procedures  . EKG 12-Lead    Disposition:   FU with Dr. Fransico Him as planned. Patient sent to ED today.   Signed, Versie Starks, MHS 03/01/2014 4:47 PM    [facility] [place], [place], [place]  [address] Phone: [phone]; Fax: [phone]

## 2014-03-01 NOTE — ED Notes (Signed)
Per EMS, pt comes from MD office across the street with c/o chest pain. Pt was getting blood work done and left the office and had onset of central chest pain that lasted approx 30 mins. Pt given 324mg  PO aspirin. Pt denies shortness of breath or any other complaints. Pt denies chest pain at this time. Pt A&OX4, NAD noted. VSS. AFib on 12 lead. Hx of AFib and HTN.

## 2014-03-02 ENCOUNTER — Ambulatory Visit: Payer: Self-pay | Admitting: Cardiology

## 2014-03-02 DIAGNOSIS — I482 Chronic atrial fibrillation, unspecified: Secondary | ICD-10-CM

## 2014-03-02 DIAGNOSIS — Z5181 Encounter for therapeutic drug level monitoring: Secondary | ICD-10-CM

## 2014-03-04 DIAGNOSIS — Z7901 Long term (current) use of anticoagulants: Secondary | ICD-10-CM | POA: Diagnosis not present

## 2014-03-04 DIAGNOSIS — D72829 Elevated white blood cell count, unspecified: Secondary | ICD-10-CM | POA: Diagnosis not present

## 2014-03-04 DIAGNOSIS — R197 Diarrhea, unspecified: Secondary | ICD-10-CM | POA: Diagnosis not present

## 2014-03-04 DIAGNOSIS — I482 Chronic atrial fibrillation: Secondary | ICD-10-CM | POA: Diagnosis not present

## 2014-03-04 DIAGNOSIS — M150 Primary generalized (osteo)arthritis: Secondary | ICD-10-CM | POA: Diagnosis not present

## 2014-03-04 DIAGNOSIS — G4733 Obstructive sleep apnea (adult) (pediatric): Secondary | ICD-10-CM | POA: Diagnosis not present

## 2014-03-04 DIAGNOSIS — I119 Hypertensive heart disease without heart failure: Secondary | ICD-10-CM | POA: Diagnosis not present

## 2014-03-04 DIAGNOSIS — J4530 Mild persistent asthma, uncomplicated: Secondary | ICD-10-CM | POA: Diagnosis not present

## 2014-03-04 DIAGNOSIS — E860 Dehydration: Secondary | ICD-10-CM | POA: Diagnosis not present

## 2014-03-04 DIAGNOSIS — D509 Iron deficiency anemia, unspecified: Secondary | ICD-10-CM | POA: Diagnosis not present

## 2014-03-04 DIAGNOSIS — N390 Urinary tract infection, site not specified: Secondary | ICD-10-CM | POA: Diagnosis not present

## 2014-03-18 ENCOUNTER — Encounter: Payer: Self-pay | Admitting: Physician Assistant

## 2014-03-23 DIAGNOSIS — Z7901 Long term (current) use of anticoagulants: Secondary | ICD-10-CM | POA: Diagnosis not present

## 2014-04-15 DIAGNOSIS — M549 Dorsalgia, unspecified: Secondary | ICD-10-CM | POA: Diagnosis not present

## 2014-04-15 DIAGNOSIS — J4530 Mild persistent asthma, uncomplicated: Secondary | ICD-10-CM | POA: Diagnosis not present

## 2014-04-15 DIAGNOSIS — R8299 Other abnormal findings in urine: Secondary | ICD-10-CM | POA: Diagnosis not present

## 2014-04-15 DIAGNOSIS — D509 Iron deficiency anemia, unspecified: Secondary | ICD-10-CM | POA: Diagnosis not present

## 2014-04-15 DIAGNOSIS — I119 Hypertensive heart disease without heart failure: Secondary | ICD-10-CM | POA: Diagnosis not present

## 2014-04-15 DIAGNOSIS — I482 Chronic atrial fibrillation: Secondary | ICD-10-CM | POA: Diagnosis not present

## 2014-04-18 ENCOUNTER — Encounter: Payer: Medicare Other | Admitting: Internal Medicine

## 2014-04-19 ENCOUNTER — Ambulatory Visit: Payer: Medicare Other | Admitting: Cardiology

## 2014-04-20 ENCOUNTER — Telehealth: Payer: Self-pay | Admitting: Cardiology

## 2014-04-20 NOTE — Telephone Encounter (Signed)
Spoke with pt and she states that she fell several years ago and has had back pain since then. Pt states that Dr. Brigitte Pulse suggested that she use a tens machine for the pain. Pt has a pacemaker and wanted to know if it would be ok for her to use the tens machine? Informed pt that Dr. Radford Pax was out of the office but that I would route her this information for review and advisement.

## 2014-04-20 NOTE — Telephone Encounter (Signed)
Please route to EP for recommendations

## 2014-04-20 NOTE — Telephone Encounter (Signed)
New message      Pt has back pain.  She also has a pacemaker. Can she try a "tens" patch for pain?

## 2014-04-21 NOTE — Telephone Encounter (Signed)
Thought its probably ok, she could have sensing issues while wearing the TENS unit.  We could have her come to the device clinic and let us interrogate the device while wearing the TENS unit if necessary.

## 2014-04-21 NOTE — Telephone Encounter (Signed)
To Dr. Rayann Heman.

## 2014-04-22 NOTE — Telephone Encounter (Signed)
Please schedule pt on device clinic schedule. Routing to [employer].

## 2014-04-22 NOTE — Telephone Encounter (Signed)
Per Dr. Radford Pax, patient to have device check with TENS unit.   Called patient several times, line busy.  Will try again later.

## 2014-05-04 DIAGNOSIS — N390 Urinary tract infection, site not specified: Secondary | ICD-10-CM | POA: Diagnosis not present

## 2014-05-13 DIAGNOSIS — Z7901 Long term (current) use of anticoagulants: Secondary | ICD-10-CM | POA: Diagnosis not present

## 2014-05-20 DIAGNOSIS — Z7901 Long term (current) use of anticoagulants: Secondary | ICD-10-CM | POA: Diagnosis not present

## 2014-05-21 NOTE — Progress Notes (Signed)
Cardiology Office Note   Date:  05/23/2014   ID:  Anna Farrell, DOB May 13, 1927, MRN [id number]  PCP:  Anna Neer, MD    Chief Complaint  Patient presents with  . Atrial Fibrillation  . Follow-up    sleep Apnea  . Shortness of Breath      History of Present Illness: Anna Farrell is a 81 y.o. female with a history of chronic atrial fibrillation, orthostatic hypotension, PPM, asymptomatic PVC's and severe OSA who presents today for followup. She is doing well. She denies any LE edema, palpitations.  She now presents for followup. She is doing well with her device. She tolerates the nasal pillow mask and feels the pressure is adequate.  She denies any chest pain, LE edema, palpitations or syncope. She has some SOB when she gets up in the am but that goes away after her inhaler.  She rarely has some dizziness with changes in position.    Past Medical History  Diagnosis Date  . Vasovagal syncope   . Hypertension   . Hyperlipidemia   . Orthostatic hypotension   . Osteoporosis   . Pacemaker 10/11/06    implanted by Dr Leonia Reeves for pauses > 4 seconds with afib  . Iron deficiency anemia   . Colitis 5/13  . Chronic low back pain 2008    crushed vertebrae  . Tachycardia-bradycardia syndrome   . Permanent atrial fibrillation   . Asymptomatic PVCs   . History of colonic polyps   . Extrinsic asthma, unspecified     LOV Dr Joya Gaskins 7/12  . Long term (current) use of anticoagulants   . Cholelithiasis   . Generalized osteoarthrosis, unspecified site   . Pancreatitis     Past Surgical History  Procedure Laterality Date  . Vesicovaginal fistula closure w/ tah  1981  . Pacemaker insertion  10/11/2006    SJM Zephyr XL DR implanted by Dr Leonia Reeves for afib with pause > 4 seconds  . Colonoscopy    . Appendectomy    . Abdominal hysterectomy    . Cholecystectomy  09/17/2011    Procedure: LAPAROSCOPIC CHOLECYSTECTOMY;  Surgeon: Rolm Bookbinder, MD;  Location: [facility];  Service:  General;;  . Cardiac catheterization  2009    Normal coronary arteries  . Ercp N/A 04/07/2013    Procedure: ENDOSCOPIC RETROGRADE CHOLANGIOPANCREATOGRAPHY (ERCP);  Surgeon: Jeryl Columbia, MD;  Location: [facility];  Service: Endoscopy;  Laterality: N/A;  . Eus  04/07/2013    Procedure: UPPER ENDOSCOPIC ULTRASOUND (EUS) RADIAL;  Surgeon: Jeryl Columbia, MD;  Location: [facility];  Service: Endoscopy;;  . Hip arthroplasty Left 08/17/2013    Procedure: ARTHROPLASTY BIPOLAR HIP;  Surgeon: Renette Butters, MD;  Location: [facility];  Service: Orthopedics;  Laterality: Left;     Current Outpatient Prescriptions  Medication Sig Dispense Refill  . beclomethasone (QVAR) 40 MCG/ACT inhaler Inhale 2 puffs into the lungs 2 (two) times daily as needed (breathing, shortness of breath).    . Cholecalciferol (VITAMIN D3) 1000 UNITS CAPS Take 2 capsules by mouth daily.     Marland Kitchen lisinopril (PRINIVIL,ZESTRIL) 20 MG tablet Take 20 mg by mouth daily.    . metoprolol succinate (TOPROL-XL) 50 MG 24 hr tablet Take 1 tablet (50 mg total) by mouth 2 (two) times daily. Note increase in dose 60 tablet 1  . nitroGLYCERIN (NITROSTAT) 0.4 MG SL tablet Place 1 tablet (0.4 mg total) under the tongue every 5 (five) minutes as needed for chest pain. 90 tablet  3  . pravastatin (PRAVACHOL) 40 MG tablet Take 1 tablet (40 mg total) by mouth daily. Once daily 90 tablet 1  . rOPINIRole (REQUIP) 0.25 MG tablet Take 0.25 mg by mouth at bedtime. Takes 1-2 tablets    . traMADol (ULTRAM) 50 MG tablet Take 100 mg by mouth 2 (two) times daily.    . TRAVATAN Z 0.004 % SOLN ophthalmic solution Place 1 drop into both eyes daily.    . vitamin B-12 (CYANOCOBALAMIN) 100 MCG tablet Take 100 mcg by mouth once a week.    . warfarin (COUMADIN) 3 MG tablet Take 1.5 mg tablet daily, have PT/INR checked on Thursday January 28th, 2016 (Patient taking differently: 3-4.5 mg. Take 3 mg on all days except, take 4.5 on Thursday ONLY) 30 tablet 1   No current  facility-administered medications for this visit.    Allergies:   Betadine; Codeine; and Rocephin    Social History:  The patient  reports that she has never smoked. She has never used smokeless tobacco. She reports that she does not drink alcohol or use illicit drugs.   Family History:  The patient's family history includes Hypertension in her mother; Pancreatitis in her mother.    ROS:  Please see the history of present illness.   Otherwise, review of systems are positive for none.   All other systems are reviewed and negative.    PHYSICAL EXAM: VS:  BP 130/90 mmHg  Pulse 75  Ht 5\' 2"  (1.575 m)  Wt 140 lb 6.4 oz (63.685 kg)  BMI 25.67 kg/m2  SpO2 97% , BMI Body mass index is 25.67 kg/(m^2). GEN: Well nourished, well developed, in no acute distress HEENT: normal Neck: no JVD, carotid bruits, or masses Cardiac: RRR; no murmurs, rubs, or gallops,no edema  Respiratory:  clear to auscultation bilaterally, normal work of breathing GI: soft, nontender, nondistended, + BS MS: no deformity or atrophy Skin: warm and dry, no rash Neuro:  Strength and sensation are intact Psych: euthymic mood, full affect   EKG:  EKG is not ordered today.    Recent Labs: 10/12/2013: Pro B Natriuretic peptide (BNP) 127.0* 01/31/2014: Magnesium 1.9; TSH 3.673 03/01/2014: ALT 13; BUN 17; Creatinine 0.84; Hemoglobin 12.3; Platelets 240; Potassium 3.8; Sodium 137    Lipid Panel No results found for: CHOL, TRIG, HDL, CHOLHDL, VLDL, LDLCALC, LDLDIRECT    Wt Readings from Last 3 Encounters:  05/23/14 140 lb 6.4 oz (63.685 kg)  03/01/14 130 lb (58.968 kg)  01/30/14 130 lb 11.7 oz (59.3 kg)    ASSESSMENT AND PLAN:  1. Severe OSA on BiPAP at 10/6cm H2O and tolerating well.  He d/l today showed an AHI of 31.6/hr on BiPAP at 10/6cm H2O.  She is waking up with a dry mouth and is not using her chin strap.  I have recommended that she start using the chin strap and then I will get a d/l in 4 weeks.    2. Chronic atrial fibrillation - on coumadin. Rate controlled - continue warfarin/Toprol 3. Orthostatic hypotension - resolved 4. PVC's - asymptomatic 5. S/P PPM for SSS 6. Chronic SOB improved with inhalers      Current medicines are reviewed at length with the patient today.  The patient does not have concerns regarding medicines.  The following changes have been made:  no change  Labs/ tests ordered today include: see above assessment and plan No orders of the defined types were placed in this encounter.     Disposition:  FU with me in 6 months   Signed, Sueanne Margarita, MD  05/23/2014 9:07 AM    [facility] [place], [place], [place]  [address] Phone: [phone]; Fax: [phone]

## 2014-05-23 ENCOUNTER — Ambulatory Visit (INDEPENDENT_AMBULATORY_CARE_PROVIDER_SITE_OTHER): Payer: Medicare Other | Admitting: Internal Medicine

## 2014-05-23 ENCOUNTER — Encounter: Payer: Self-pay | Admitting: Cardiology

## 2014-05-23 ENCOUNTER — Ambulatory Visit (INDEPENDENT_AMBULATORY_CARE_PROVIDER_SITE_OTHER): Payer: Medicare Other | Admitting: Cardiology

## 2014-05-23 ENCOUNTER — Encounter: Payer: Self-pay | Admitting: Internal Medicine

## 2014-05-23 VITALS — BP 130/90 | HR 75 | Ht 62.0 in | Wt 140.4 lb

## 2014-05-23 DIAGNOSIS — I5031 Acute diastolic (congestive) heart failure: Secondary | ICD-10-CM

## 2014-05-23 DIAGNOSIS — I10 Essential (primary) hypertension: Secondary | ICD-10-CM

## 2014-05-23 DIAGNOSIS — I482 Chronic atrial fibrillation, unspecified: Secondary | ICD-10-CM

## 2014-05-23 DIAGNOSIS — I495 Sick sinus syndrome: Secondary | ICD-10-CM

## 2014-05-23 DIAGNOSIS — G4733 Obstructive sleep apnea (adult) (pediatric): Secondary | ICD-10-CM

## 2014-05-23 DIAGNOSIS — I951 Orthostatic hypotension: Secondary | ICD-10-CM | POA: Diagnosis not present

## 2014-05-23 DIAGNOSIS — Z950 Presence of cardiac pacemaker: Secondary | ICD-10-CM

## 2014-05-23 LAB — CUP PACEART INCLINIC DEVICE CHECK
Brady Statistic RV Percent Paced: 8.1 %
Lead Channel Impedance Value: 394 Ohm
Lead Channel Pacing Threshold Pulse Width: 0.5 ms
Lead Channel Sensing Intrinsic Amplitude: 4.5 mV
Lead Channel Setting Pacing Amplitude: 2.5 V
Lead Channel Setting Sensing Sensitivity: 2 mV
MDC IDC MSMT BATTERY IMPEDANCE: 2000 Ohm
MDC IDC MSMT BATTERY VOLTAGE: 2.76 V
MDC IDC MSMT LEADCHNL RV PACING THRESHOLD AMPLITUDE: 1 V
MDC IDC PG SERIAL: 1908400
MDC IDC SESS DTM: 20160516101341
MDC IDC SET LEADCHNL RV PACING PULSEWIDTH: 0.5 ms
Pulse Gen Model: 5826

## 2014-05-23 NOTE — Progress Notes (Signed)
Electrophysiology Office Note   Date:  05/23/2014   ID:  Anna Farrell, DOB [birth year], MRN [id number]  PCP:  [name], MD  Cardiologist:  Dr Radford Pax Primary Electrophysiologist: Thompson Grayer, MD    Chief Complaint  Patient presents with  . Bradycardia     History of Present Illness: Anna Farrell is a 81 y.o. female who presents today for electrophysiology evaluation.  She is doing reasonably well.  She has leg weakness and occasional dizziness which limit mobility.  SOB is stable. Today, she denies symptoms of palpitations, chest pain, , orthopnea, PND, lower extremity edema, claudication, presyncope, syncope, bleeding, or neurologic sequela. The patient is tolerating medications without difficulties and is otherwise without complaint today.    Past Medical History  Diagnosis Date  . Vasovagal syncope   . Hypertension   . Hyperlipidemia   . Orthostatic hypotension   . Osteoporosis   . Pacemaker 10/11/06    implanted by Dr Leonia Reeves for pauses > 4 seconds with afib  . Iron deficiency anemia   . Colitis 5/13  . Chronic low back pain 2008    crushed vertebrae  . Tachycardia-bradycardia syndrome   . Permanent atrial fibrillation   . Asymptomatic PVCs   . History of colonic polyps   . Extrinsic asthma, unspecified     LOV Dr Joya Gaskins 7/12  . Long term (current) use of anticoagulants   . Cholelithiasis   . Generalized osteoarthrosis, unspecified site   . Pancreatitis    Past Surgical History  Procedure Laterality Date  . Vesicovaginal fistula closure w/ tah  1981  . Pacemaker insertion  10/11/2006    SJM Zephyr XL DR implanted by Dr Leonia Reeves for afib with pause > 4 seconds  . Colonoscopy    . Appendectomy    . Abdominal hysterectomy    . Cholecystectomy  09/17/2011    Procedure: LAPAROSCOPIC CHOLECYSTECTOMY;  Surgeon: Rolm Bookbinder, MD;  Location: [facility];  Service: General;;  . Cardiac catheterization  2009    Normal coronary arteries  . Ercp N/A 04/07/2013   Procedure: ENDOSCOPIC RETROGRADE CHOLANGIOPANCREATOGRAPHY (ERCP);  Surgeon: Jeryl Columbia, MD;  Location: [facility];  Service: Endoscopy;  Laterality: N/A;  . Eus  04/07/2013    Procedure: UPPER ENDOSCOPIC ULTRASOUND (EUS) RADIAL;  Surgeon: Jeryl Columbia, MD;  Location: [facility];  Service: Endoscopy;;  . Hip arthroplasty Left 08/17/2013    Procedure: ARTHROPLASTY BIPOLAR HIP;  Surgeon: Renette Butters, MD;  Location: [facility];  Service: Orthopedics;  Laterality: Left;     Current Outpatient Prescriptions  Medication Sig Dispense Refill  . beclomethasone (QVAR) 40 MCG/ACT inhaler Inhale 2 puffs into the lungs 2 (two) times daily as needed (breathing, shortness of breath).    . Cholecalciferol (VITAMIN D3) 1000 UNITS CAPS Take 2 capsules by mouth daily.     Marland Kitchen lisinopril (PRINIVIL,ZESTRIL) 20 MG tablet Take 20 mg by mouth daily.    . metoprolol succinate (TOPROL-XL) 50 MG 24 hr tablet Take 1 tablet (50 mg total) by mouth 2 (two) times daily. Note increase in dose 60 tablet 1  . nitroGLYCERIN (NITROSTAT) 0.4 MG SL tablet Place 1 tablet (0.4 mg total) under the tongue every 5 (five) minutes as needed for chest pain. (Patient taking differently: Place 0.4 mg under the tongue every 5 (five) minutes as needed for chest pain (MAX 3 TABLETS). ) 90 tablet 3  . pravastatin (PRAVACHOL) 40 MG tablet Take 1 tablet (40 mg total) by mouth daily. Once daily 90  tablet 1  . rOPINIRole (REQUIP) 0.25 MG tablet Take 0.25 mg by mouth at bedtime. Takes 1-2 tablets    . traMADol (ULTRAM) 50 MG tablet Take 100 mg by mouth 2 (two) times daily.    . TRAVATAN Z 0.004 % SOLN ophthalmic solution Place 1 drop into both eyes daily.    . vitamin B-12 (CYANOCOBALAMIN) 100 MCG tablet Take 100 mcg by mouth once a week.    . warfarin (COUMADIN) 3 MG tablet Take 1.5 mg tablet daily, have PT/INR checked on Thursday January 28th, 2016 (Patient taking differently: 3-4.5 mg. Take 3 mg on all days except, take 4.5 on Thursday ONLY) 30  tablet 1   No current facility-administered medications for this visit.    Allergies:   Codeine; Betadine; and Rocephin   Social History:  The patient  reports that she has never smoked. She has never used smokeless tobacco. She reports that she does not drink alcohol or use illicit drugs.   Family History:  The patient's family history includes Cancer in her mother; Hypertension in her mother; Pancreatitis in her mother.    ROS:  Please see the history of present illness.   All other systems are reviewed and negative.    PHYSICAL EXAM: VS:  BP 130/90 mmHg  Pulse 75  Ht 5\' 2"  (1.575 m)  Wt 140 lb 6.4 oz (63.685 kg)  BMI 25.67 kg/m2 , BMI Body mass index is 25.67 kg/(m^2). GEN: elderly, in no acute distress HEENT: normal Neck: no JVD, carotid bruits, or masses Cardiac: iRRR; no murmurs, rubs, or gallops,no edema  Respiratory:  clear to auscultation bilaterally, normal work of breathing GI: soft, nontender, nondistended, + BS MS: no deformity or atrophy Skin: warm and dry, device pocket is well healed Neuro:  Strength and sensation are intact Psych: euthymic mood, full affect  Device interrogation is reviewed today in detail.  See PaceArt for details.   Recent Labs: 10/12/2013: Pro B Natriuretic peptide (BNP) 127.0* 01/31/2014: Magnesium 1.9; TSH 3.673 03/01/2014: ALT 13; BUN 17; Creatinine 0.84; Hemoglobin 12.3; Platelets 240; Potassium 3.8; Sodium 137    Lipid Panel  No results found for: CHOL, TRIG, HDL, CHOLHDL, VLDL, LDLCALC, LDLDIRECT   Wt Readings from Last 3 Encounters:  05/23/14 140 lb 6.4 oz (63.685 kg)  05/23/14 140 lb 6.4 oz (63.685 kg)  03/01/14 130 lb (58.968 kg)      Other studies Reviewed: Additional studies/ records that were reviewed today include: Dr Landis Gandy notes    ASSESSMENT AND PLAN:  1.  Tachy/brady syndrome Normal pacemaker function See Pace Art report No changes today Chronically low R waves, not dependant. No indication for lead  revision, particularly given her advanced age.   2. Permanent afib Rate controlled chads2vasc score is at least 4.  Continue long term anticoagulation  3. HTN Stable No change required today  Follow-up with EP NP in 1 year, follow-up with Dr Radford Pax as scheduled  Current medicines are reviewed at length with the patient today.   The patient does not have concerns regarding her medicines.  The following changes were made today:  none  Signed, Thompson Grayer, MD  05/23/2014 10:47 AM     [facility] [address] [place] [address] [phone] (office) [phone] (fax)

## 2014-05-23 NOTE — Patient Instructions (Signed)
Medication Instructions:  Your physician recommends that you continue on your current medications as directed. Please refer to the Current Medication list given to you today.   Labwork: None  Testing/Procedures: None  Follow-Up: Your physician wants you to follow-up in: 6 months with Dr. Radford Pax. You will receive a reminder letter in the mail two months in advance. If you don't receive a letter, please call our office to schedule the follow-up appointment.   Any Other Special Instructions Will Be Listed Below (If Applicable). Please start wearing your chin strap. We will get a download in a month and call you with instructions.

## 2014-05-23 NOTE — Patient Instructions (Signed)
Medication Instructions:  Your physician recommends that you continue on your current medications as directed. Please refer to the Current Medication list given to you today.   Labwork: NONE  Testing/Procedures: NONE  Follow-Up: Your physician wants you to follow-up in: 12 months with Chanetta Marshall, NP You will receive a reminder letter in the mail two months in advance. If you don't receive a letter, please call our office to schedule the follow-up appointment.   Any Other Special Instructions Will Be Listed Below (If Applicable).

## 2014-05-27 ENCOUNTER — Encounter: Payer: Self-pay | Admitting: Internal Medicine

## 2014-06-14 ENCOUNTER — Encounter: Payer: Self-pay | Admitting: Cardiology

## 2014-06-20 DIAGNOSIS — Z7901 Long term (current) use of anticoagulants: Secondary | ICD-10-CM | POA: Diagnosis not present

## 2014-06-20 DIAGNOSIS — Z5181 Encounter for therapeutic drug level monitoring: Secondary | ICD-10-CM | POA: Diagnosis not present

## 2014-06-27 ENCOUNTER — Encounter: Payer: Self-pay | Admitting: Cardiology

## 2014-06-28 ENCOUNTER — Other Ambulatory Visit: Payer: Self-pay | Admitting: *Deleted

## 2014-06-28 DIAGNOSIS — G4733 Obstructive sleep apnea (adult) (pediatric): Secondary | ICD-10-CM

## 2014-07-18 DIAGNOSIS — J45901 Unspecified asthma with (acute) exacerbation: Secondary | ICD-10-CM | POA: Diagnosis not present

## 2014-07-18 DIAGNOSIS — I482 Chronic atrial fibrillation: Secondary | ICD-10-CM | POA: Diagnosis not present

## 2014-07-18 DIAGNOSIS — I119 Hypertensive heart disease without heart failure: Secondary | ICD-10-CM | POA: Diagnosis not present

## 2014-07-18 DIAGNOSIS — Z7901 Long term (current) use of anticoagulants: Secondary | ICD-10-CM | POA: Diagnosis not present

## 2014-07-18 DIAGNOSIS — M549 Dorsalgia, unspecified: Secondary | ICD-10-CM | POA: Diagnosis not present

## 2014-07-18 DIAGNOSIS — D509 Iron deficiency anemia, unspecified: Secondary | ICD-10-CM | POA: Diagnosis not present

## 2014-07-18 DIAGNOSIS — E780 Pure hypercholesterolemia: Secondary | ICD-10-CM | POA: Diagnosis not present

## 2014-08-02 DIAGNOSIS — B349 Viral infection, unspecified: Secondary | ICD-10-CM | POA: Diagnosis not present

## 2014-09-09 DIAGNOSIS — Z7901 Long term (current) use of anticoagulants: Secondary | ICD-10-CM | POA: Diagnosis not present

## 2014-09-23 DIAGNOSIS — Z23 Encounter for immunization: Secondary | ICD-10-CM | POA: Diagnosis not present

## 2014-09-23 DIAGNOSIS — Z7901 Long term (current) use of anticoagulants: Secondary | ICD-10-CM | POA: Diagnosis not present

## 2014-10-11 DIAGNOSIS — K219 Gastro-esophageal reflux disease without esophagitis: Secondary | ICD-10-CM | POA: Diagnosis not present

## 2014-10-20 DIAGNOSIS — Z7901 Long term (current) use of anticoagulants: Secondary | ICD-10-CM | POA: Diagnosis not present

## 2014-10-28 DIAGNOSIS — J4530 Mild persistent asthma, uncomplicated: Secondary | ICD-10-CM | POA: Diagnosis not present

## 2014-10-28 DIAGNOSIS — Z7901 Long term (current) use of anticoagulants: Secondary | ICD-10-CM | POA: Diagnosis not present

## 2014-10-28 DIAGNOSIS — M810 Age-related osteoporosis without current pathological fracture: Secondary | ICD-10-CM | POA: Diagnosis not present

## 2014-10-28 DIAGNOSIS — E7800 Pure hypercholesterolemia, unspecified: Secondary | ICD-10-CM | POA: Diagnosis not present

## 2014-10-28 DIAGNOSIS — Z23 Encounter for immunization: Secondary | ICD-10-CM | POA: Diagnosis not present

## 2014-10-28 DIAGNOSIS — I119 Hypertensive heart disease without heart failure: Secondary | ICD-10-CM | POA: Diagnosis not present

## 2014-10-28 DIAGNOSIS — D509 Iron deficiency anemia, unspecified: Secondary | ICD-10-CM | POA: Diagnosis not present

## 2014-10-28 DIAGNOSIS — I482 Chronic atrial fibrillation: Secondary | ICD-10-CM | POA: Diagnosis not present

## 2014-10-28 DIAGNOSIS — M150 Primary generalized (osteo)arthritis: Secondary | ICD-10-CM | POA: Diagnosis not present

## 2014-10-28 DIAGNOSIS — R7309 Other abnormal glucose: Secondary | ICD-10-CM | POA: Diagnosis not present

## 2014-10-28 DIAGNOSIS — M549 Dorsalgia, unspecified: Secondary | ICD-10-CM | POA: Diagnosis not present

## 2014-10-28 DIAGNOSIS — Z0000 Encounter for general adult medical examination without abnormal findings: Secondary | ICD-10-CM | POA: Diagnosis not present

## 2014-11-18 DIAGNOSIS — Z7901 Long term (current) use of anticoagulants: Secondary | ICD-10-CM | POA: Diagnosis not present

## 2014-11-23 ENCOUNTER — Ambulatory Visit: Payer: Medicare Other | Admitting: Cardiology

## 2014-12-16 DIAGNOSIS — Z7901 Long term (current) use of anticoagulants: Secondary | ICD-10-CM | POA: Diagnosis not present

## 2015-01-13 DIAGNOSIS — Z7901 Long term (current) use of anticoagulants: Secondary | ICD-10-CM | POA: Diagnosis not present

## 2015-01-18 ENCOUNTER — Ambulatory Visit (INDEPENDENT_AMBULATORY_CARE_PROVIDER_SITE_OTHER): Payer: Medicare Other | Admitting: *Deleted

## 2015-01-18 ENCOUNTER — Ambulatory Visit (INDEPENDENT_AMBULATORY_CARE_PROVIDER_SITE_OTHER): Payer: Medicare Other | Admitting: Cardiology

## 2015-01-18 ENCOUNTER — Encounter: Payer: Self-pay | Admitting: Cardiology

## 2015-01-18 VITALS — BP 152/100 | HR 72 | Ht 62.0 in | Wt 141.4 lb

## 2015-01-18 DIAGNOSIS — I951 Orthostatic hypotension: Secondary | ICD-10-CM | POA: Diagnosis not present

## 2015-01-18 DIAGNOSIS — I495 Sick sinus syndrome: Secondary | ICD-10-CM

## 2015-01-18 DIAGNOSIS — I482 Chronic atrial fibrillation, unspecified: Secondary | ICD-10-CM

## 2015-01-18 DIAGNOSIS — Z950 Presence of cardiac pacemaker: Secondary | ICD-10-CM | POA: Diagnosis not present

## 2015-01-18 DIAGNOSIS — I493 Ventricular premature depolarization: Secondary | ICD-10-CM

## 2015-01-18 DIAGNOSIS — G4733 Obstructive sleep apnea (adult) (pediatric): Secondary | ICD-10-CM

## 2015-01-18 DIAGNOSIS — I10 Essential (primary) hypertension: Secondary | ICD-10-CM

## 2015-01-18 LAB — CUP PACEART INCLINIC DEVICE CHECK
Battery Impedance: 2300 Ohm
Battery Voltage: 2.76 V
Brady Statistic RV Percent Paced: 6.7 %
Date Time Interrogation Session: 20170111155136
Implantable Lead Implant Date: 20080804
Implantable Lead Location: 753860
Lead Channel Impedance Value: 350 Ohm
Lead Channel Sensing Intrinsic Amplitude: 4.2 mV
Lead Channel Setting Pacing Amplitude: 2.5 V
Lead Channel Setting Pacing Pulse Width: 0.5 ms
Lead Channel Setting Sensing Sensitivity: 2 mV
MDC IDC LEAD IMPLANT DT: 20080804
MDC IDC LEAD LOCATION: 753859
MDC IDC MSMT LEADCHNL RV PACING THRESHOLD AMPLITUDE: 1 V
MDC IDC MSMT LEADCHNL RV PACING THRESHOLD PULSEWIDTH: 0.5 ms
Pulse Gen Model: 5826
Pulse Gen Serial Number: 1908400

## 2015-01-18 NOTE — Progress Notes (Signed)
Pacemaker check in clinic. Normal device function. Threshold, sensing, impedance consistent with previous measurements. Device programmed to maximize longevity. No high ventricular rates noted. Device programmed at appropriate safety margins. Histogram distribution appropriate for patient activity level. Device programmed to optimize intrinsic conduction. Estimated longevity 4.25-6.5 years. ROV with JA in May.

## 2015-01-18 NOTE — Patient Instructions (Signed)
Medication Instructions:  Your physician recommends that you continue on your current medications as directed. Please refer to the Current Medication list given to you today.   Labwork: Noen  Testing/Procedures: None  Follow-Up: Your physician wants you to follow-up in: 6 months with Dr. Radford Pax. You will receive a reminder letter in the mail two months in advance. If you don't receive a letter, please call our office to schedule the follow-up appointment.   Any Other Special Instructions Will Be Listed Below (If Applicable). Please check your BLOOD PRESSURE daily for one week and call with results.    If you need a refill on your cardiac medications before your next appointment, please call your pharmacy.

## 2015-01-18 NOTE — Progress Notes (Signed)
Cardiology Office Note   Date:  01/18/2015   ID:  Anna Farrell, DOB 04-30-1927, MRN [id number]  PCP:  [name], MD    Chief Complaint  Patient presents with  . Sleep Apnea  . Atrial Fibrillation      History of Present Illness: Anna Farrell is a 82 y.o. female with a history of chronic atrial fibrillation, orthostatic hypotension, PPM, asymptomatic PVC's and severe OSA who presents today for followup. She is doing well. She denies any LE edema, palpitations.  She now presents for followup. She is doing well with her device. She tolerates the nasal pillow mask and feels the pressure is adequate.She has started using the chin strap but not every night.  She feels tired in the am and feels weak.  Once she is up she does ok.  She does nap some during the day.  Some nights she does not sleep well but is not sure why but thinks she is going to sleep too late and then has to sleep late in the am. She denies any chest pain, LE edema, palpitations or syncope. She has some SOB but it has not been very bad lately. She rarely has some dizziness with changes in position.    Past Medical History  Diagnosis Date  . Vasovagal syncope   . Hypertension   . Hyperlipidemia   . Orthostatic hypotension   . Osteoporosis   . Pacemaker 10/11/06    implanted by Dr Leonia Reeves for pauses > 4 seconds with afib  . Iron deficiency anemia   . Colitis 5/13  . Chronic low back pain 2008    crushed vertebrae  . Tachycardia-bradycardia syndrome ([place])   . Permanent atrial fibrillation ([place])   . Asymptomatic PVCs   . History of colonic polyps   . Extrinsic asthma, unspecified     LOV Dr Joya Gaskins 7/12  . Long term (current) use of anticoagulants   . Cholelithiasis   . Generalized osteoarthrosis, unspecified site   . Pancreatitis     Past Surgical History  Procedure Laterality Date  . Vesicovaginal fistula closure w/ tah  1981  . Pacemaker insertion  10/11/2006    SJM Zephyr XL  DR implanted by Dr Leonia Reeves for afib with pause > 4 seconds  . Colonoscopy    . Appendectomy    . Abdominal hysterectomy    . Cholecystectomy  09/17/2011    Procedure: LAPAROSCOPIC CHOLECYSTECTOMY;  Surgeon: Rolm Bookbinder, MD;  Location: [facility];  Service: General;;  . Cardiac catheterization  2009    Normal coronary arteries  . Ercp N/A 04/07/2013    Procedure: ENDOSCOPIC RETROGRADE CHOLANGIOPANCREATOGRAPHY (ERCP);  Surgeon: Jeryl Columbia, MD;  Location: [facility];  Service: Endoscopy;  Laterality: N/A;  . Eus  04/07/2013    Procedure: UPPER ENDOSCOPIC ULTRASOUND (EUS) RADIAL;  Surgeon: Jeryl Columbia, MD;  Location: [facility];  Service: Endoscopy;;  . Hip arthroplasty Left 08/17/2013    Procedure: ARTHROPLASTY BIPOLAR HIP;  Surgeon: Renette Butters, MD;  Location: [facility];  Service: Orthopedics;  Laterality: Left;     Current Outpatient Prescriptions  Medication Sig Dispense Refill  . beclomethasone (QVAR) 40 MCG/ACT inhaler Inhale 2 puffs into the lungs 2 (two) times daily as needed (breathing, shortness of breath).    . Cholecalciferol (VITAMIN D3) 1000 UNITS CAPS Take 2 capsules by mouth daily.     Marland Kitchen lisinopril (PRINIVIL,ZESTRIL) 20 MG  tablet Take 20 mg by mouth daily.    . metoprolol succinate (TOPROL-XL) 50 MG 24 hr tablet Take 1 tablet (50 mg total) by mouth 2 (two) times daily. Note increase in dose 60 tablet 1  . nitroGLYCERIN (NITROSTAT) 0.4 MG SL tablet Place 1 tablet (0.4 mg total) under the tongue every 5 (five) minutes as needed for chest pain. (Patient taking differently: Place 0.4 mg under the tongue every 5 (five) minutes as needed for chest pain (MAX 3 TABLETS). ) 90 tablet 3  . pravastatin (PRAVACHOL) 40 MG tablet Take 1 tablet (40 mg total) by mouth daily. Once daily 90 tablet 1  . rOPINIRole (REQUIP) 0.25 MG tablet Take 0.25 mg by mouth at bedtime. Takes 1-2 tablets    . traMADol (ULTRAM) 50 MG tablet Take 100 mg by mouth 2 (two) times daily.    . TRAVATAN Z 0.004 %  SOLN ophthalmic solution Place 1 drop into both eyes daily.    Marland Kitchen warfarin (COUMADIN) 3 MG tablet Take 1.5 mg tablet daily, have PT/INR checked on Thursday January 28th, 2016 (Patient taking differently: 3-4.5 mg. Take 3 mg on all days except, take 4.5 on Thursday ONLY) 30 tablet 1   No current facility-administered medications for this visit.    Allergies:   Codeine; Betadine; and Rocephin    Social History:  The patient  reports that she has never smoked. She has never used smokeless tobacco. She reports that she does not drink alcohol or use illicit drugs.   Family History:  The patient's family history includes Cancer in her mother; Hypertension in her mother; Pancreatitis in her mother.    ROS:  Please see the history of present illness.   Otherwise, review of systems are positive for none.   All other systems are reviewed and negative.    PHYSICAL EXAM: VS:  BP 152/100 mmHg  Pulse 72  Ht 5\' 2"  (1.575 m)  Wt 141 lb 6.4 oz (64.139 kg)  BMI 25.86 kg/m2 , BMI Body mass index is 25.86 kg/(m^2). GEN: Well nourished, well developed, in no acute distress HEENT: normal Neck: no JVD, carotid bruits, or masses Cardiac: irregularly irregular; no murmurs, rubs, or gallops,no edema.   Respiratory:  clear to auscultation bilaterally, normal work of breathing GI: soft, nontender, nondistended, + BS MS: no deformity or atrophy Skin: warm and dry, no rash Neuro:  Strength and sensation are intact Psych: euthymic mood, full affect   EKG:  EKG is not ordered today.    Recent Labs: 01/31/2014: Magnesium 1.9; TSH 3.673 03/01/2014: ALT 13; BUN 17; Creatinine, Ser 0.84; Hemoglobin 12.3; Platelets 240; Potassium 3.8; Sodium 137    Lipid Panel No results found for: CHOL, TRIG, HDL, CHOLHDL, VLDL, LDLCALC, LDLDIRECT    Wt Readings from Last 3 Encounters:  01/18/15 141 lb 6.4 oz (64.139 kg)  05/23/14 140 lb 6.4 oz (63.685 kg)  05/23/14 140 lb 6.4 oz (63.685 kg)    ASSESSMENT AND  PLAN:  1. Severe OSA on BiPAP at 10/6cm H2O and tolerating well. Her d/l today showed an AHI of 31.4/hr on BiPAP at 10/6cm H2O.She is only 39% compliant in using more than 4 hours nightly.   Shes not using her chin strap consistently. I have recommended that she start using the chin strap and then I will set her up for a 2 week autotitration from 5 to 18cm H2O with a pressure support of 63mmHg.   2. Chronic atrial fibrillation - on coumadin. Rate controlled - continue  warfarin/Toprol 3. Orthostatic hypotension - resolved 4. PVC's - asymptomatic 5. S/P PPM for SSS 6. Chronic SOB improved with inhalers 7. HTN - poorly controlled today but at home - I have asked her to check her BP daily for a week and call with the results.  COntinue ACE I and BB   Current medicines are reviewed at length with the patient today.  The patient does not have concerns regarding medicines.  The following changes have been made:  no change  Labs/ tests ordered today: See above Assessment and Plan No orders of the defined types were placed in this encounter.     Disposition:   FU with me in 6 months  Signed, Sueanne Margarita, MD  01/18/2015 3:09 PM    [facility] [place], [place], [place]  [address] Phone: [phone]; Fax: [phone]

## 2015-01-23 ENCOUNTER — Other Ambulatory Visit: Payer: Self-pay

## 2015-01-23 DIAGNOSIS — G4733 Obstructive sleep apnea (adult) (pediatric): Secondary | ICD-10-CM

## 2015-01-24 ENCOUNTER — Encounter: Payer: Self-pay | Admitting: Cardiology

## 2015-01-25 ENCOUNTER — Encounter: Payer: Self-pay | Admitting: Internal Medicine

## 2015-01-30 DIAGNOSIS — Z7901 Long term (current) use of anticoagulants: Secondary | ICD-10-CM | POA: Diagnosis not present

## 2015-02-24 DIAGNOSIS — Z7901 Long term (current) use of anticoagulants: Secondary | ICD-10-CM | POA: Diagnosis not present

## 2015-03-24 DIAGNOSIS — Z7901 Long term (current) use of anticoagulants: Secondary | ICD-10-CM | POA: Diagnosis not present

## 2015-03-31 DIAGNOSIS — H2513 Age-related nuclear cataract, bilateral: Secondary | ICD-10-CM | POA: Diagnosis not present

## 2015-04-05 DIAGNOSIS — Z7901 Long term (current) use of anticoagulants: Secondary | ICD-10-CM | POA: Diagnosis not present

## 2015-04-25 DIAGNOSIS — I482 Chronic atrial fibrillation: Secondary | ICD-10-CM | POA: Diagnosis not present

## 2015-04-25 DIAGNOSIS — R7309 Other abnormal glucose: Secondary | ICD-10-CM | POA: Diagnosis not present

## 2015-04-25 DIAGNOSIS — J4530 Mild persistent asthma, uncomplicated: Secondary | ICD-10-CM | POA: Diagnosis not present

## 2015-04-25 DIAGNOSIS — I119 Hypertensive heart disease without heart failure: Secondary | ICD-10-CM | POA: Diagnosis not present

## 2015-04-25 DIAGNOSIS — E7800 Pure hypercholesterolemia, unspecified: Secondary | ICD-10-CM | POA: Diagnosis not present

## 2015-05-02 ENCOUNTER — Encounter: Payer: Self-pay | Admitting: Cardiology

## 2015-05-26 DIAGNOSIS — Z7901 Long term (current) use of anticoagulants: Secondary | ICD-10-CM | POA: Diagnosis not present

## 2015-06-07 DIAGNOSIS — H5203 Hypermetropia, bilateral: Secondary | ICD-10-CM | POA: Diagnosis not present

## 2015-06-07 DIAGNOSIS — H2513 Age-related nuclear cataract, bilateral: Secondary | ICD-10-CM | POA: Diagnosis not present

## 2015-06-07 DIAGNOSIS — H401413 Capsular glaucoma with pseudoexfoliation of lens, right eye, severe stage: Secondary | ICD-10-CM | POA: Diagnosis not present

## 2015-06-15 DIAGNOSIS — H2511 Age-related nuclear cataract, right eye: Secondary | ICD-10-CM | POA: Diagnosis not present

## 2015-06-15 DIAGNOSIS — H25811 Combined forms of age-related cataract, right eye: Secondary | ICD-10-CM | POA: Diagnosis not present

## 2015-06-23 DIAGNOSIS — Z7901 Long term (current) use of anticoagulants: Secondary | ICD-10-CM | POA: Diagnosis not present

## 2015-07-21 DIAGNOSIS — Z7901 Long term (current) use of anticoagulants: Secondary | ICD-10-CM | POA: Diagnosis not present

## 2015-08-10 DIAGNOSIS — H25812 Combined forms of age-related cataract, left eye: Secondary | ICD-10-CM | POA: Diagnosis not present

## 2015-08-10 DIAGNOSIS — H2512 Age-related nuclear cataract, left eye: Secondary | ICD-10-CM | POA: Diagnosis not present

## 2015-08-18 DIAGNOSIS — Z7901 Long term (current) use of anticoagulants: Secondary | ICD-10-CM | POA: Diagnosis not present

## 2015-09-29 DIAGNOSIS — Z23 Encounter for immunization: Secondary | ICD-10-CM | POA: Diagnosis not present

## 2015-09-29 DIAGNOSIS — R7309 Other abnormal glucose: Secondary | ICD-10-CM | POA: Diagnosis not present

## 2015-09-29 DIAGNOSIS — I482 Chronic atrial fibrillation: Secondary | ICD-10-CM | POA: Diagnosis not present

## 2015-09-29 DIAGNOSIS — I119 Hypertensive heart disease without heart failure: Secondary | ICD-10-CM | POA: Diagnosis not present

## 2015-10-06 DIAGNOSIS — Z7901 Long term (current) use of anticoagulants: Secondary | ICD-10-CM | POA: Diagnosis not present

## 2015-10-13 DIAGNOSIS — Z7901 Long term (current) use of anticoagulants: Secondary | ICD-10-CM | POA: Diagnosis not present

## 2015-11-13 IMAGING — CT CT ABD-PELV W/ CM
1 of 3 series · 14 of 32 positions shown, 19 images · IV contrast (100 ML OMNI 300)
Comparison: CT ABD/PELVIS W CM dated 06/03/2011

CLINICAL DATA: Nausea, vomiting, history of cholecystectomy,
hysterectomy, appendectomy, vesiculovaginal fistula closure.

EXAM:
CT ABDOMEN AND PELVIS WITH CONTRAST
TECHNIQUE: Multidetector CT imaging of the abdomen and pelvis was performed
using the standard protocol following bolus administration of
intravenous contrast.
CONTRAST:  100mL OMNIPAQUE IOHEXOL 300 MG/ML  SOLN

[Series 2: abd/pel with · axial · 0.68mm/px · z∈[+1170,+1530]mm · 14 of 82 slices shown, 19 images]
[im 5/82  soft-tissue]
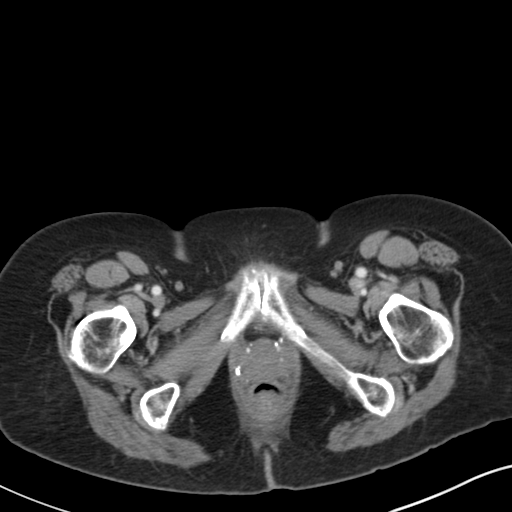
[im 5/82  bone]
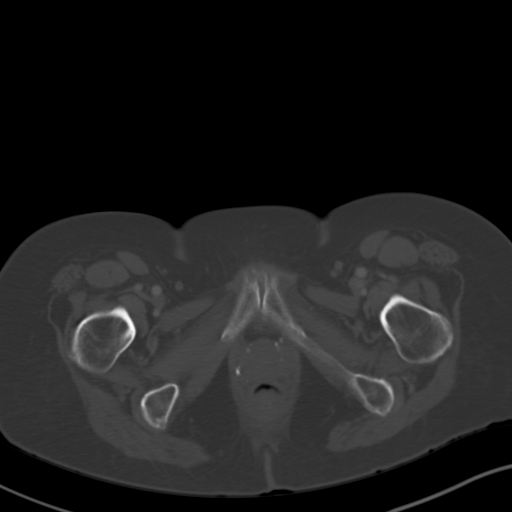
[im 13/82  soft-tissue]
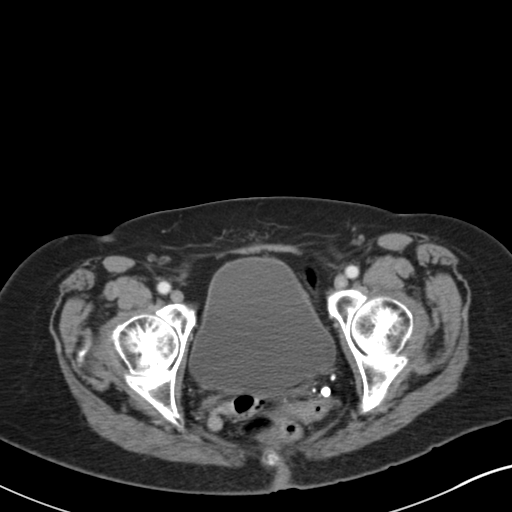
[im 17/82  soft-tissue]
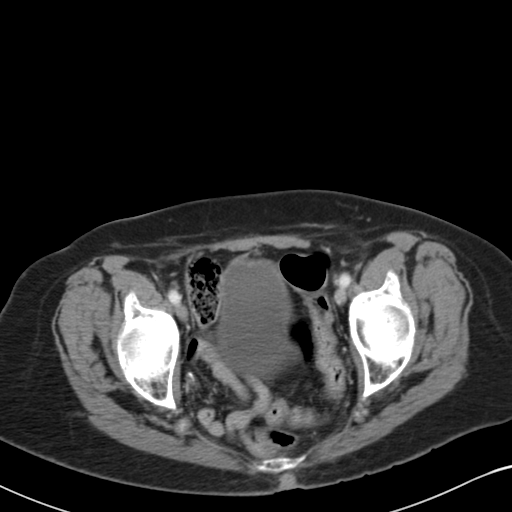
[im 25/82  soft-tissue]
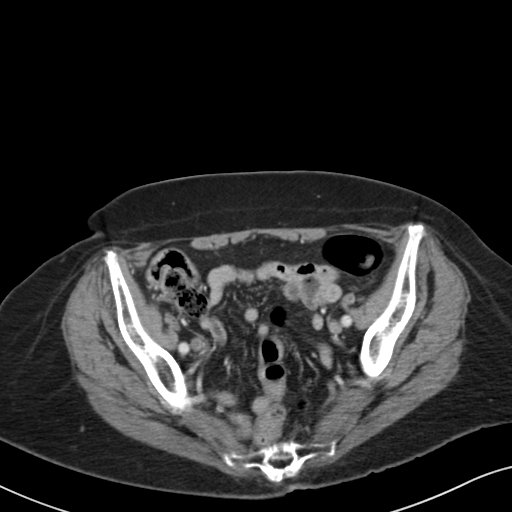
[im 29/82  soft-tissue]
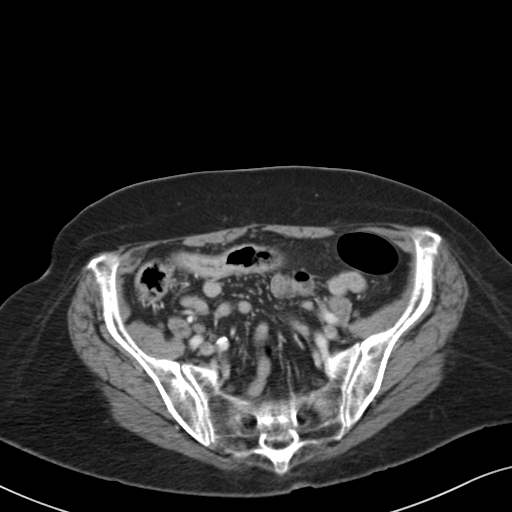
[im 37/82  soft-tissue]
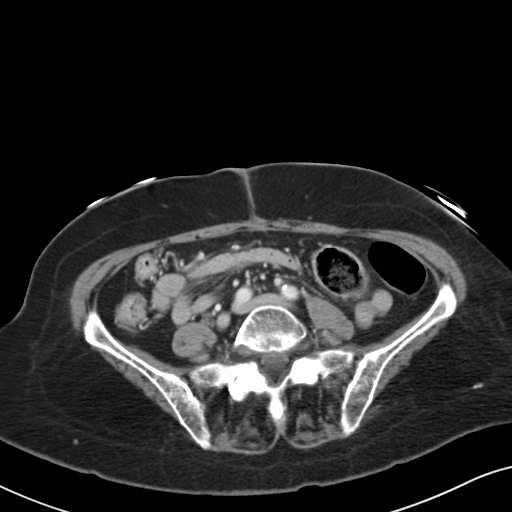
[im 41/82  soft-tissue]
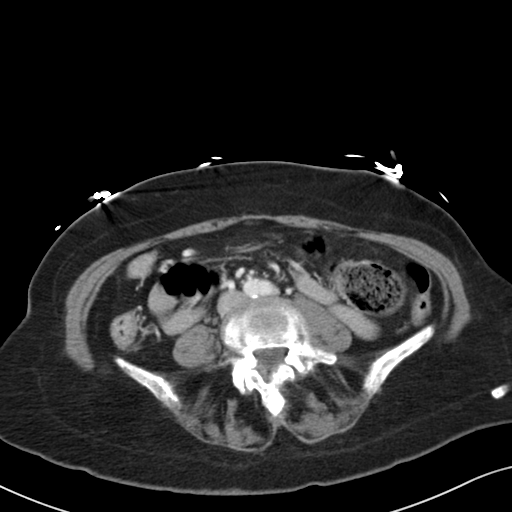
[im 45/82  soft-tissue]
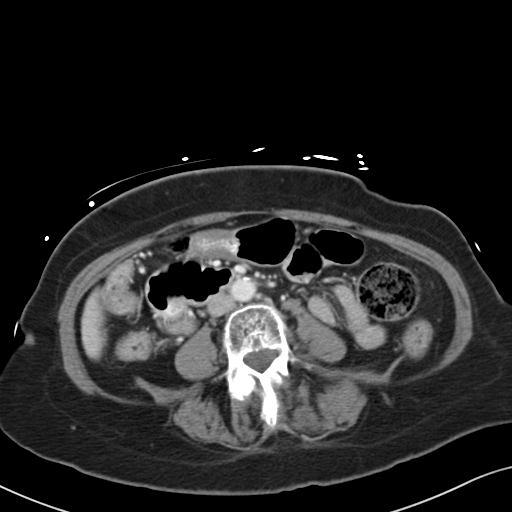
[im 53/82  soft-tissue]
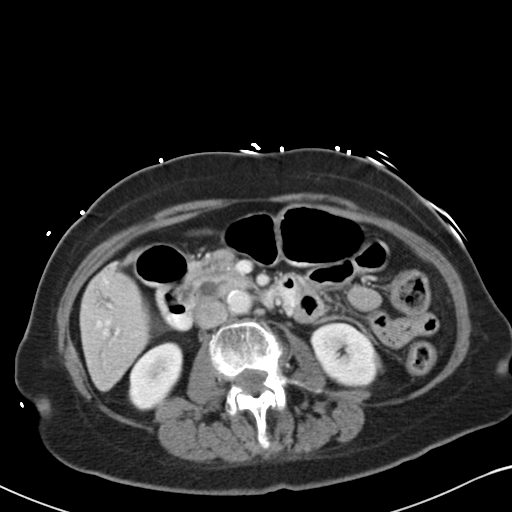
[im 53/82  bone]
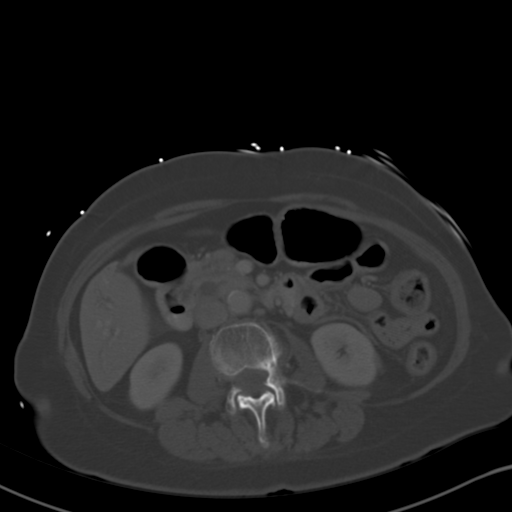
[im 57/82  soft-tissue]
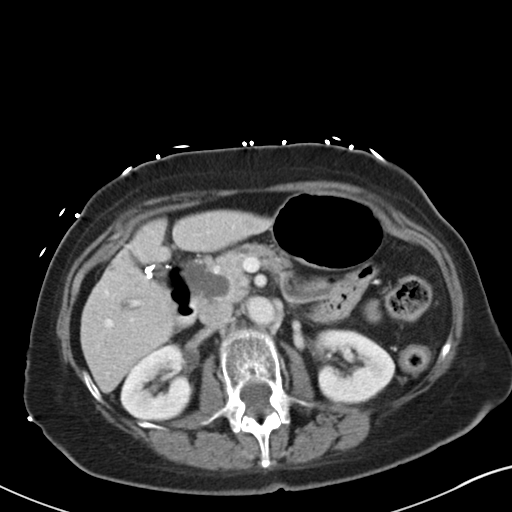
[im 65/82  soft-tissue]
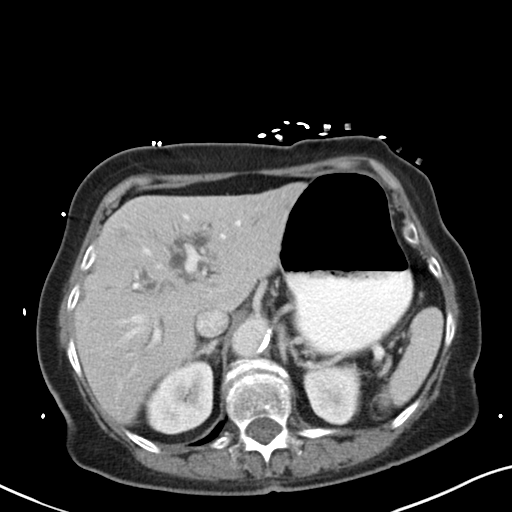
[im 65/82  lung]
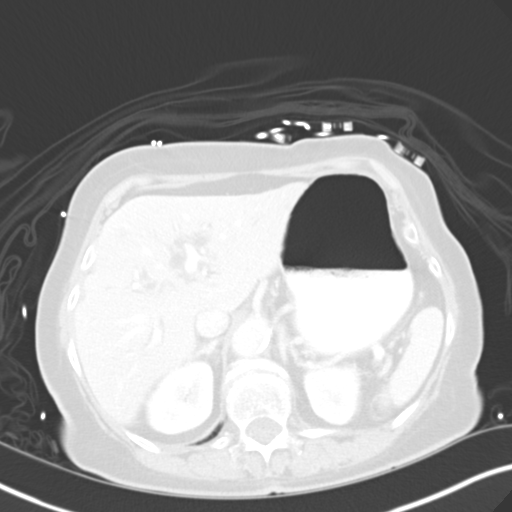
[im 69/82  soft-tissue]
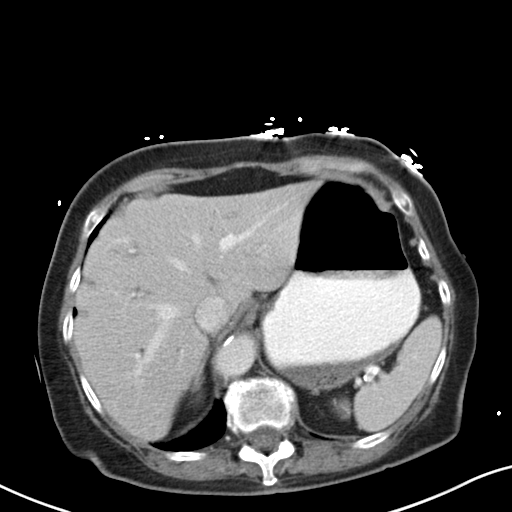
[im 69/82  lung]
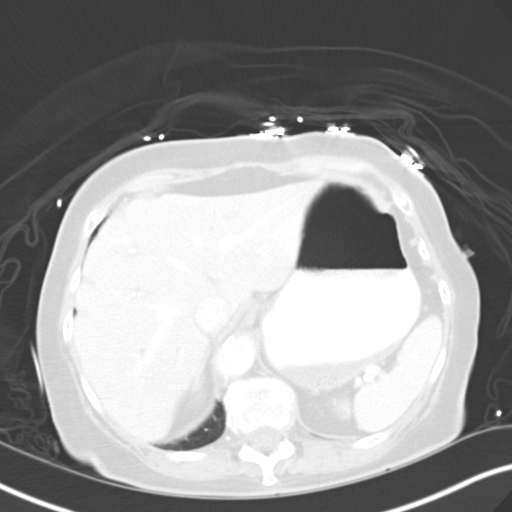
[im 73/82  lung]
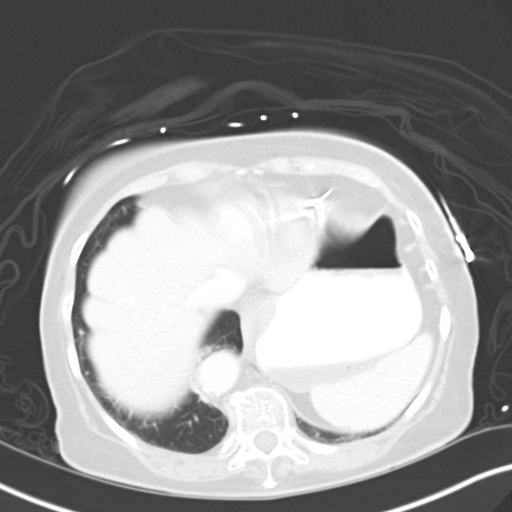
[im 77/82  soft-tissue]
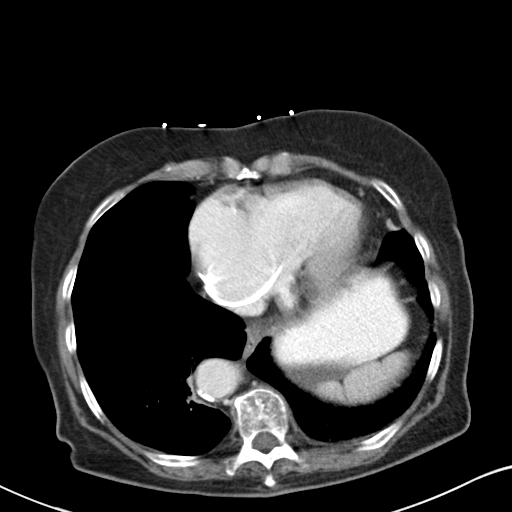
[im 77/82  lung]
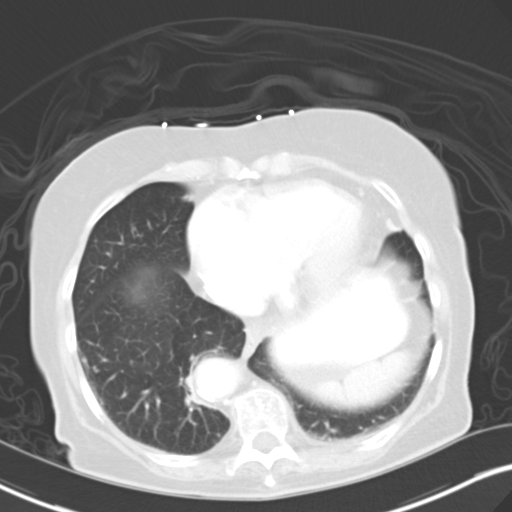

[14 of 32 positions shown; findings below may reference images not displayed]

FINDINGS: Included view of the lung bases are clear. The heart appears mildly
enlarged, cardiac pacer wires in place, pericardium is unremarkable.

Status post cholecystectomy. Mild intrahepatic biliary dilatation,
Common bile duct is 15 mm. No CT findings of choledocholithiasis.
The liver is otherwise unremarkable.

Spleen, pancreas, adrenal glands are nonsuspicious.

Layering debris within the stomach which is moderately distended
with contrast and air. Small and large bowel are normal in course
and caliber. Mild multi segmental colonic wall thickening may be
accentuated by under distention. Ptotic transverse colon with mild
amount of retained large bowel stool. No intraperitoneal free fluid
nor free air.

Kidneys are unremarkable, delayed imaging demonstrates prompt
symmetric excretion of contrast into the proximal urinary collecting
system. Aortoiliac vessels are normal course and caliber with mild
calcific atherosclerosis. Urinary bladder is well distended
unremarkable. Status post hysterectomy.

Laparotomy scar. Patient is osteopenic and scoliotic. L1 burst
fracture, status post cement augmentation.
IMPRESSION: Status post interval cholecystectomy. Mild intra and extrahepatic
biliary dilatation, the degree of intrahepatic biliary dilatation is
increased from prior CT. No CT findings of choledocholithiasis.

Mild colonic wall thickening which may be over estimated by under
distention under can reflect mild colitis. No bowel obstruction.

  By: Felner Ceola

## 2015-11-14 DIAGNOSIS — E7800 Pure hypercholesterolemia, unspecified: Secondary | ICD-10-CM | POA: Diagnosis not present

## 2015-11-14 DIAGNOSIS — M810 Age-related osteoporosis without current pathological fracture: Secondary | ICD-10-CM | POA: Diagnosis not present

## 2015-11-14 DIAGNOSIS — Z0000 Encounter for general adult medical examination without abnormal findings: Secondary | ICD-10-CM | POA: Diagnosis not present

## 2015-11-14 DIAGNOSIS — R7309 Other abnormal glucose: Secondary | ICD-10-CM | POA: Diagnosis not present

## 2015-11-14 DIAGNOSIS — I482 Chronic atrial fibrillation: Secondary | ICD-10-CM | POA: Diagnosis not present

## 2015-11-14 DIAGNOSIS — G4733 Obstructive sleep apnea (adult) (pediatric): Secondary | ICD-10-CM | POA: Diagnosis not present

## 2015-11-14 DIAGNOSIS — D509 Iron deficiency anemia, unspecified: Secondary | ICD-10-CM | POA: Diagnosis not present

## 2015-11-14 DIAGNOSIS — M549 Dorsalgia, unspecified: Secondary | ICD-10-CM | POA: Diagnosis not present

## 2015-11-14 DIAGNOSIS — K219 Gastro-esophageal reflux disease without esophagitis: Secondary | ICD-10-CM | POA: Diagnosis not present

## 2015-11-14 DIAGNOSIS — J4530 Mild persistent asthma, uncomplicated: Secondary | ICD-10-CM | POA: Diagnosis not present

## 2015-11-14 DIAGNOSIS — M150 Primary generalized (osteo)arthritis: Secondary | ICD-10-CM | POA: Diagnosis not present

## 2015-11-14 DIAGNOSIS — I119 Hypertensive heart disease without heart failure: Secondary | ICD-10-CM | POA: Diagnosis not present

## 2015-12-15 DIAGNOSIS — Z7901 Long term (current) use of anticoagulants: Secondary | ICD-10-CM | POA: Diagnosis not present

## 2016-01-12 DIAGNOSIS — Z7901 Long term (current) use of anticoagulants: Secondary | ICD-10-CM | POA: Diagnosis not present

## 2016-01-12 DIAGNOSIS — H401413 Capsular glaucoma with pseudoexfoliation of lens, right eye, severe stage: Secondary | ICD-10-CM | POA: Diagnosis not present

## 2016-02-23 DIAGNOSIS — Z7901 Long term (current) use of anticoagulants: Secondary | ICD-10-CM | POA: Diagnosis not present

## 2016-03-22 DIAGNOSIS — Z7901 Long term (current) use of anticoagulants: Secondary | ICD-10-CM | POA: Diagnosis not present

## 2016-03-27 IMAGING — CR DG CHEST 2V
2 series · 2 of 2 positions shown · non-contrast
Comparison: Chest radiograph 08/18/2013

CLINICAL DATA: Fever

EXAM:
CHEST  2 VIEW

[w chest lat]
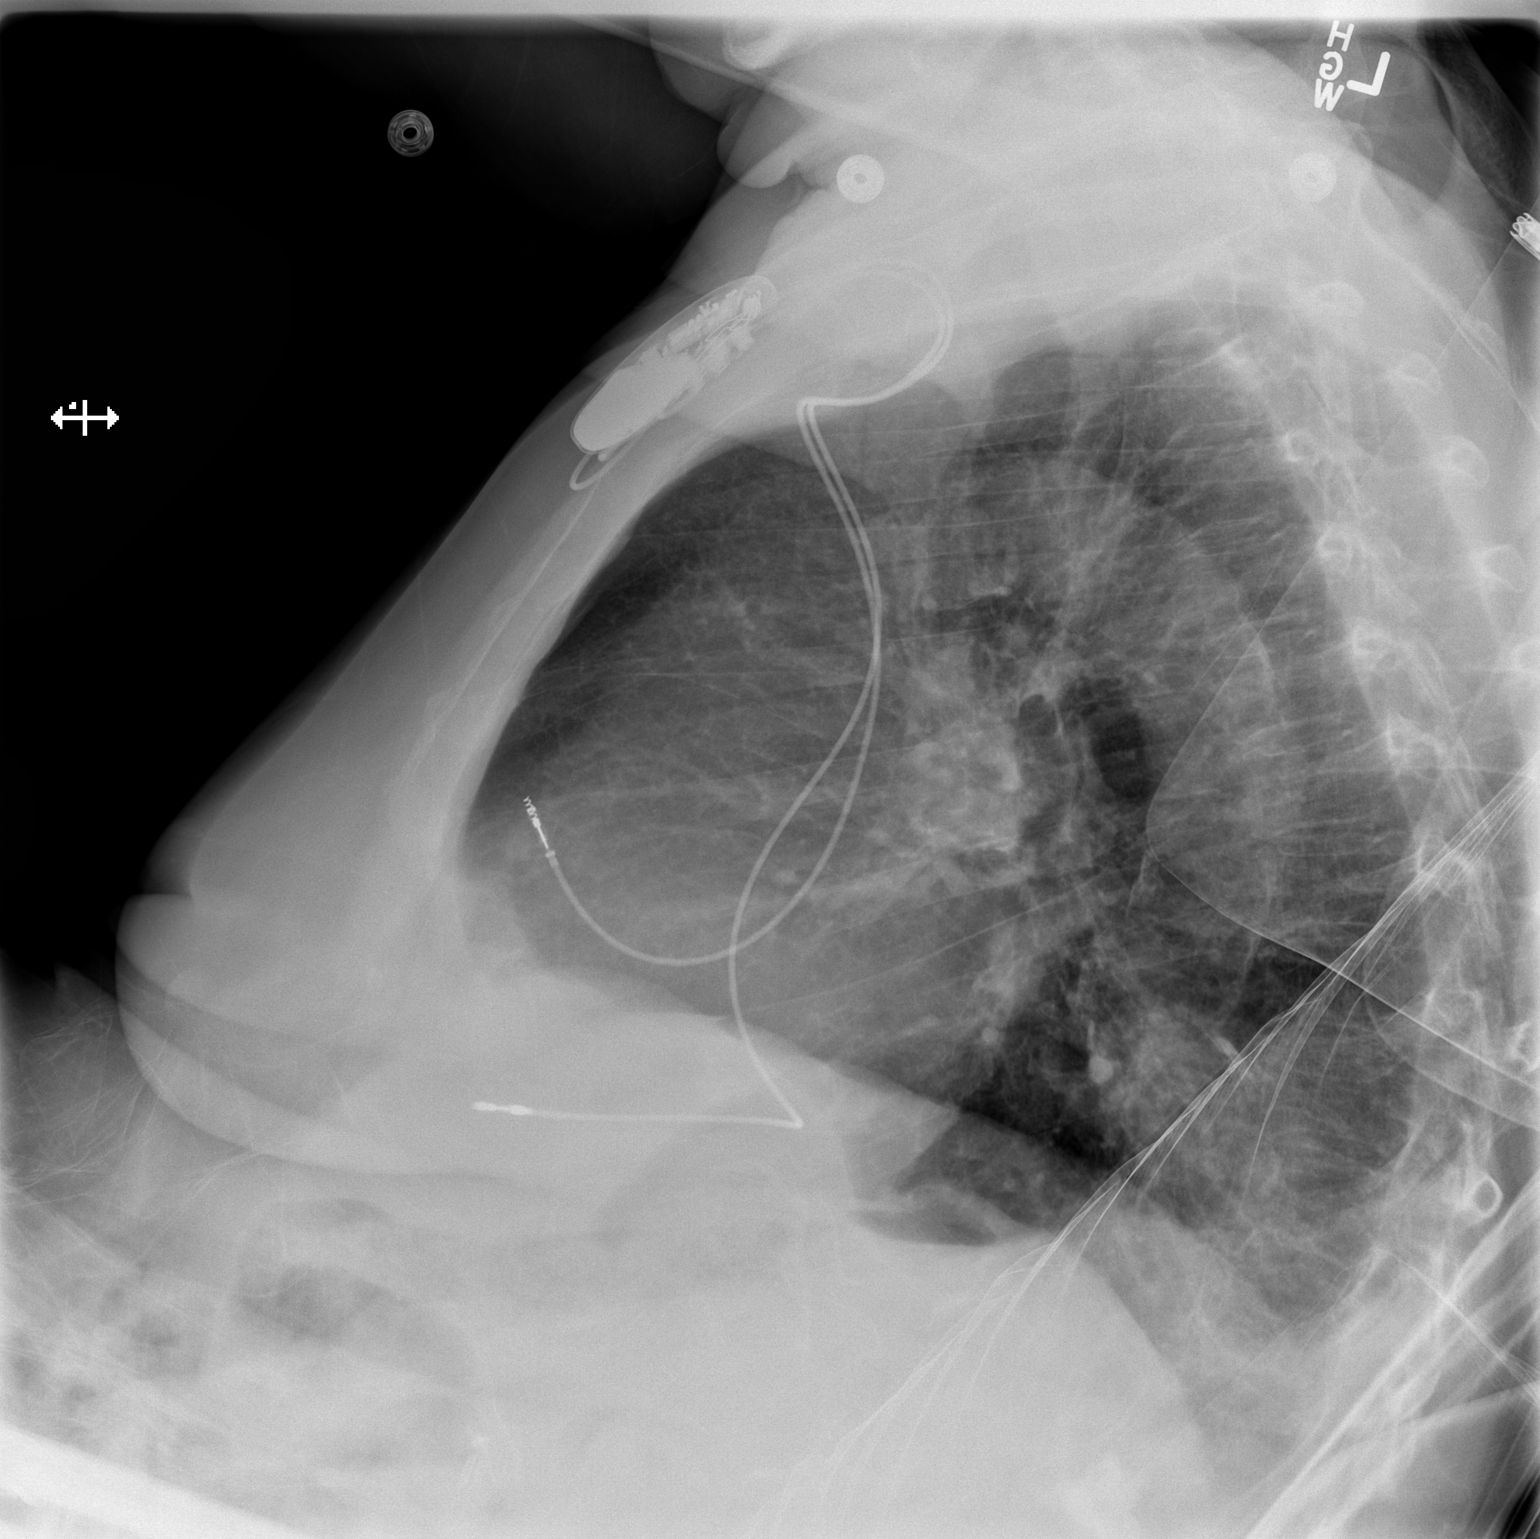

[x chest ap]
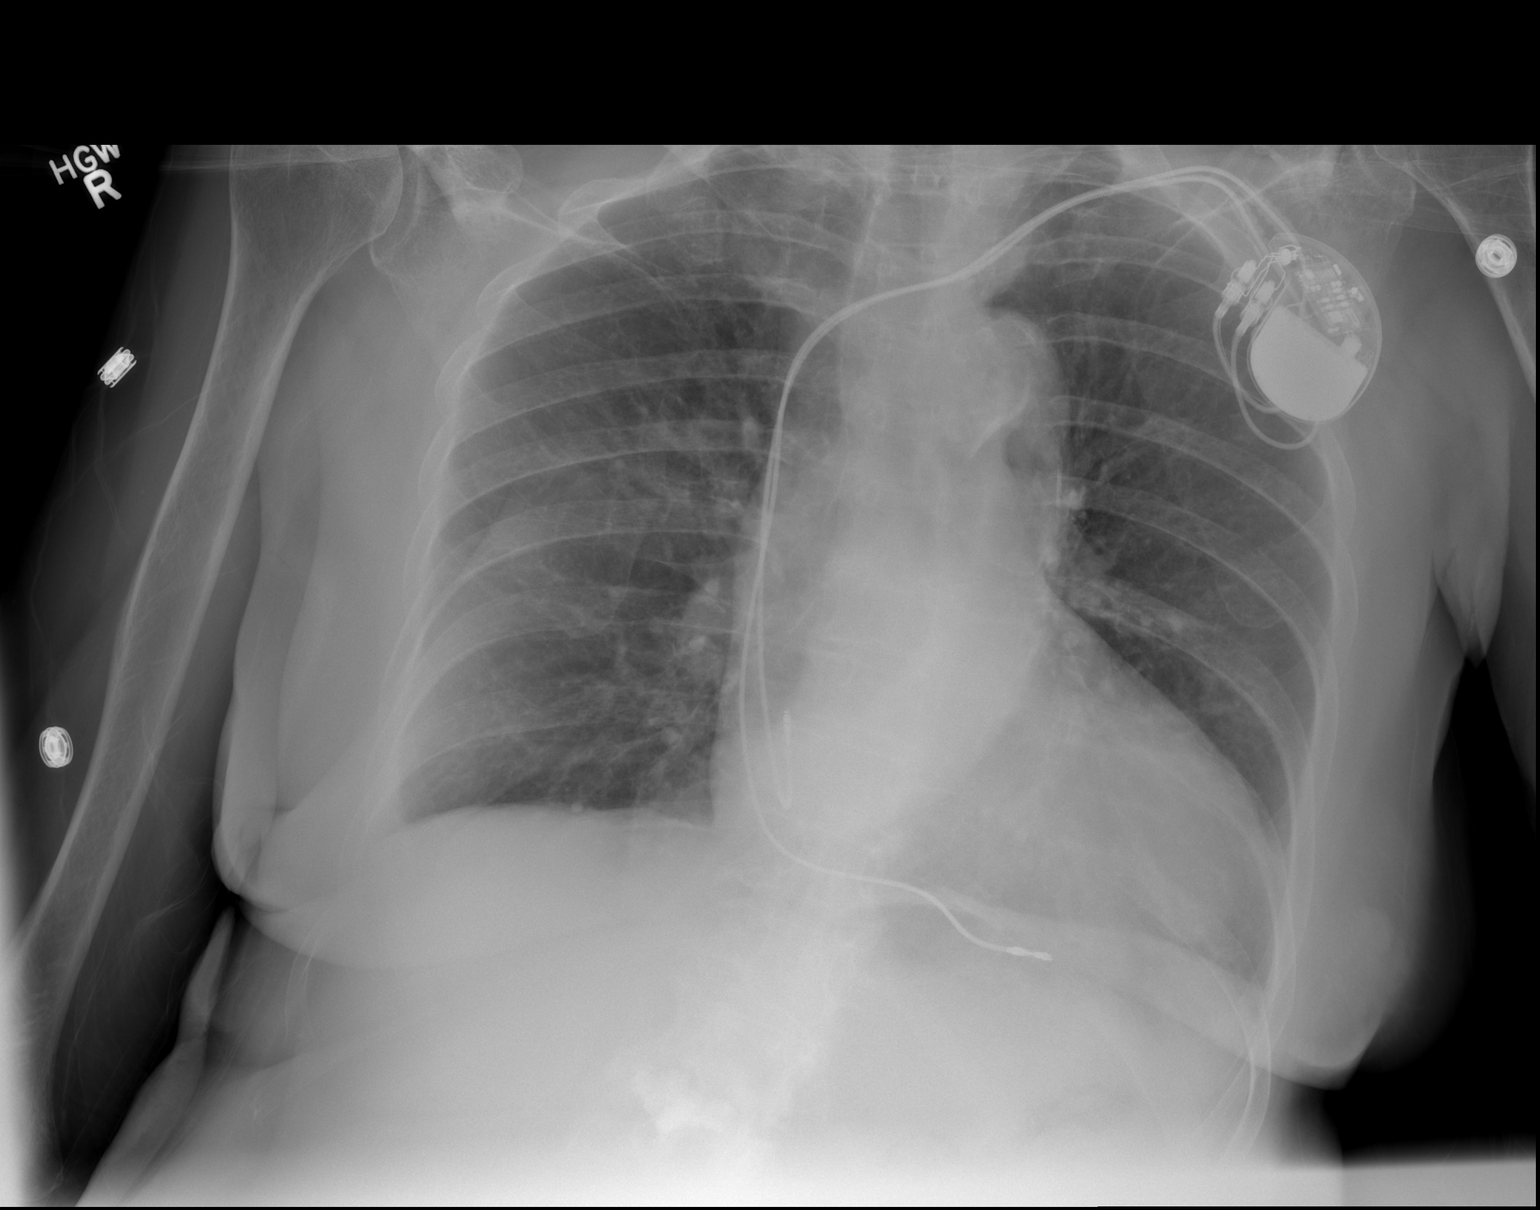

[2 of 2 positions shown; findings below may reference images not displayed]

FINDINGS: Dual lead pacer apparatus overlies the left hemi thorax, leads are
stable in position. Stable enlarged cardiac and mediastinal contours
with tortuosity of the thoracic aorta. Heterogeneous opacities
demonstrated within the thorax overlying the spine on the lateral
view. No pleural effusion or pneumothorax. Mid thoracic spine
degenerative change.
IMPRESSION: Heterogeneous opacities overlying the spine on the lateral view may
be secondary to atelectasis or infection. Short-term followup
radiograph is recommended to ensure resolution.

## 2016-04-19 DIAGNOSIS — Z7901 Long term (current) use of anticoagulants: Secondary | ICD-10-CM | POA: Diagnosis not present

## 2016-04-26 DIAGNOSIS — Z7901 Long term (current) use of anticoagulants: Secondary | ICD-10-CM | POA: Diagnosis not present

## 2016-05-14 DIAGNOSIS — I482 Chronic atrial fibrillation: Secondary | ICD-10-CM | POA: Diagnosis not present

## 2016-05-14 DIAGNOSIS — G2581 Restless legs syndrome: Secondary | ICD-10-CM | POA: Diagnosis not present

## 2016-05-14 DIAGNOSIS — R7309 Other abnormal glucose: Secondary | ICD-10-CM | POA: Diagnosis not present

## 2016-05-14 DIAGNOSIS — Z7902 Long term (current) use of antithrombotics/antiplatelets: Secondary | ICD-10-CM | POA: Diagnosis not present

## 2016-05-14 DIAGNOSIS — I119 Hypertensive heart disease without heart failure: Secondary | ICD-10-CM | POA: Diagnosis not present

## 2016-05-14 DIAGNOSIS — J4530 Mild persistent asthma, uncomplicated: Secondary | ICD-10-CM | POA: Diagnosis not present

## 2016-05-14 DIAGNOSIS — M549 Dorsalgia, unspecified: Secondary | ICD-10-CM | POA: Diagnosis not present

## 2016-05-14 DIAGNOSIS — E7800 Pure hypercholesterolemia, unspecified: Secondary | ICD-10-CM | POA: Diagnosis not present

## 2016-06-14 DIAGNOSIS — I119 Hypertensive heart disease without heart failure: Secondary | ICD-10-CM | POA: Diagnosis not present

## 2016-06-14 DIAGNOSIS — Z7901 Long term (current) use of anticoagulants: Secondary | ICD-10-CM | POA: Diagnosis not present

## 2016-06-21 DIAGNOSIS — Z7901 Long term (current) use of anticoagulants: Secondary | ICD-10-CM | POA: Diagnosis not present

## 2016-06-28 DIAGNOSIS — Z7901 Long term (current) use of anticoagulants: Secondary | ICD-10-CM | POA: Diagnosis not present

## 2016-07-12 DIAGNOSIS — H40012 Open angle with borderline findings, low risk, left eye: Secondary | ICD-10-CM | POA: Diagnosis not present

## 2016-07-12 DIAGNOSIS — H401413 Capsular glaucoma with pseudoexfoliation of lens, right eye, severe stage: Secondary | ICD-10-CM | POA: Diagnosis not present

## 2016-07-12 DIAGNOSIS — D3132 Benign neoplasm of left choroid: Secondary | ICD-10-CM | POA: Diagnosis not present

## 2016-07-12 DIAGNOSIS — H3500 Unspecified background retinopathy: Secondary | ICD-10-CM | POA: Diagnosis not present

## 2016-07-16 DIAGNOSIS — I482 Chronic atrial fibrillation: Secondary | ICD-10-CM | POA: Diagnosis not present

## 2016-07-16 DIAGNOSIS — G2581 Restless legs syndrome: Secondary | ICD-10-CM | POA: Diagnosis not present

## 2016-07-16 DIAGNOSIS — H3500 Unspecified background retinopathy: Secondary | ICD-10-CM | POA: Diagnosis not present

## 2016-07-17 DIAGNOSIS — D3132 Benign neoplasm of left choroid: Secondary | ICD-10-CM | POA: Diagnosis not present

## 2016-07-17 DIAGNOSIS — H35033 Hypertensive retinopathy, bilateral: Secondary | ICD-10-CM | POA: Diagnosis not present

## 2016-07-17 DIAGNOSIS — H348312 Tributary (branch) retinal vein occlusion, right eye, stable: Secondary | ICD-10-CM | POA: Diagnosis not present

## 2016-08-06 DIAGNOSIS — Z7901 Long term (current) use of anticoagulants: Secondary | ICD-10-CM | POA: Diagnosis not present

## 2016-08-06 DIAGNOSIS — I119 Hypertensive heart disease without heart failure: Secondary | ICD-10-CM | POA: Diagnosis not present

## 2016-08-15 ENCOUNTER — Telehealth: Payer: Self-pay

## 2016-08-15 DIAGNOSIS — I10 Essential (primary) hypertension: Secondary | ICD-10-CM

## 2016-08-15 NOTE — Telephone Encounter (Signed)
Letter received from [facility] and was reviewed by Dr. Radford Pax.  Per Dr. Radford Pax, patient to wear 24 hour BP monitor. Patient also needs to schedule overdue 6 month follow-up.  Left message to call back.

## 2016-08-20 DIAGNOSIS — Z7901 Long term (current) use of anticoagulants: Secondary | ICD-10-CM | POA: Diagnosis not present

## 2016-08-20 NOTE — Telephone Encounter (Signed)
24 hour BP monitor ordered for scheduling. She wishes to see Dr. Radford Pax and Dr. Rayann Heman on the same day for follow-up. Informed her [facility] will call to arrange BP monitor and follow-up appointments. She was grateful for call and agrees with treatment plan.

## 2016-08-21 NOTE — Telephone Encounter (Signed)
BP monitor has been scheduled 8/28. OV with Dr. Rayann Heman 10/1. OV with Dr. Radford Pax 11/13.

## 2016-09-03 ENCOUNTER — Ambulatory Visit (INDEPENDENT_AMBULATORY_CARE_PROVIDER_SITE_OTHER): Payer: Medicare Other

## 2016-09-03 ENCOUNTER — Encounter: Payer: Self-pay | Admitting: *Deleted

## 2016-09-03 DIAGNOSIS — I10 Essential (primary) hypertension: Secondary | ICD-10-CM | POA: Diagnosis not present

## 2016-09-03 NOTE — Progress Notes (Signed)
Patient ID: Anna Farrell, female   DOB: [birth year], 83 y.o.   MRN: [id number] 24 hour ambulatory blood pressure monitor applied to patient using standard adult cuff.

## 2016-09-06 DIAGNOSIS — Z7901 Long term (current) use of anticoagulants: Secondary | ICD-10-CM | POA: Diagnosis not present

## 2016-09-20 DIAGNOSIS — Z7901 Long term (current) use of anticoagulants: Secondary | ICD-10-CM | POA: Diagnosis not present

## 2016-10-04 DIAGNOSIS — Z23 Encounter for immunization: Secondary | ICD-10-CM | POA: Diagnosis not present

## 2016-10-04 DIAGNOSIS — Z7901 Long term (current) use of anticoagulants: Secondary | ICD-10-CM | POA: Diagnosis not present

## 2016-10-07 ENCOUNTER — Ambulatory Visit (INDEPENDENT_AMBULATORY_CARE_PROVIDER_SITE_OTHER): Payer: Medicare Other | Admitting: Internal Medicine

## 2016-10-07 ENCOUNTER — Encounter: Payer: Self-pay | Admitting: Internal Medicine

## 2016-10-07 VITALS — BP 110/88 | HR 78 | Ht 62.0 in | Wt 132.8 lb

## 2016-10-07 DIAGNOSIS — I482 Chronic atrial fibrillation, unspecified: Secondary | ICD-10-CM

## 2016-10-07 DIAGNOSIS — I495 Sick sinus syndrome: Secondary | ICD-10-CM | POA: Diagnosis not present

## 2016-10-07 DIAGNOSIS — I10 Essential (primary) hypertension: Secondary | ICD-10-CM

## 2016-10-07 NOTE — Progress Notes (Signed)
PCP: [name], MD Primary Cardiologist:  Dr Radford Pax Primary EP:  Dr Rayann Heman  Anna Farrell is a 83 y.o. female who presents today for routine electrophysiology followup.  Since last being seen in our clinic, the patient reports doing reasonably well.  She has had nausea and vomiting intermittently for the past month.  Today, she denies symptoms of palpitations, chest pain, shortness of breath,  lower extremity edema, dizziness, presyncope, or syncope.  The patient is otherwise without complaint today.   Past Medical History:  Diagnosis Date  . Asymptomatic PVCs   . Cholelithiasis   . Chronic low back pain 2008   crushed vertebrae  . Colitis 5/13  . Extrinsic asthma, unspecified    LOV Dr Joya Gaskins 7/12  . Generalized osteoarthrosis, unspecified site   . History of colonic polyps   . Hyperlipidemia   . Hypertension   . Iron deficiency anemia   . Long term (current) use of anticoagulants   . Orthostatic hypotension   . Osteoporosis   . Pacemaker 10/11/06   implanted by Dr Leonia Reeves for pauses > 4 seconds with afib  . Pancreatitis   . Permanent atrial fibrillation ([place])   . Tachycardia-bradycardia syndrome ([place])   . Vasovagal syncope    Past Surgical History:  Procedure Laterality Date  . ABDOMINAL HYSTERECTOMY    . APPENDECTOMY    . CARDIAC CATHETERIZATION  2009   Normal coronary arteries  . CHOLECYSTECTOMY  09/17/2011   Procedure: LAPAROSCOPIC CHOLECYSTECTOMY;  Surgeon: Rolm Bookbinder, MD;  Location: [facility];  Service: General;;  . COLONOSCOPY    . ERCP N/A 04/07/2013   Procedure: ENDOSCOPIC RETROGRADE CHOLANGIOPANCREATOGRAPHY (ERCP);  Surgeon: Jeryl Columbia, MD;  Location: [facility];  Service: Endoscopy;  Laterality: N/A;  . EUS  04/07/2013   Procedure: UPPER ENDOSCOPIC ULTRASOUND (EUS) RADIAL;  Surgeon: Jeryl Columbia, MD;  Location: [facility];  Service: Endoscopy;;  . HIP ARTHROPLASTY Left 08/17/2013   Procedure: ARTHROPLASTY BIPOLAR HIP;  Surgeon: Renette Butters,  MD;  Location: [facility];  Service: Orthopedics;  Laterality: Left;  . PACEMAKER INSERTION  10/11/2006   SJM Zephyr XL DR implanted by Dr Leonia Reeves for afib with pause > 4 seconds  . VESICOVAGINAL FISTULA CLOSURE W/ TAH  1981    ROS- all systems are reviewed and negative except as per HPI above  Current Outpatient Prescriptions  Medication Sig Dispense Refill  . ALPRAZolam (XANAX) 0.25 MG tablet Take 1 tablet by mouth at bedtime.  0  . beclomethasone (QVAR) 40 MCG/ACT inhaler Inhale 2 puffs into the lungs 2 (two) times daily as needed (breathing, shortness of breath).    Marland Kitchen BREO ELLIPTA 100-25 MCG/INH AEPB Take as directed  1  . metoprolol succinate (TOPROL-XL) 50 MG 24 hr tablet Take 50 mg by mouth daily. Take with or immediately following a meal. As directed. Patient is unsure of dosing schedule.    . nitroGLYCERIN (NITROSTAT) 0.4 MG SL tablet Place 0.4 mg under the tongue every 5 (five) minutes as needed for chest pain.    . pravastatin (PRAVACHOL) 40 MG tablet Take 1 tablet (40 mg total) by mouth daily. Once daily 90 tablet 1  . traMADol (ULTRAM) 50 MG tablet Take 100 mg by mouth 2 (two) times daily.    Marland Kitchen warfarin (COUMADIN) 3 MG tablet Take as directed by the coumadin clinic.    Marland Kitchen amLODipine (NORVASC) 5 MG tablet Take 5 mg by mouth as directed.  1  . lisinopril (PRINIVIL,ZESTRIL) 20 MG  tablet Take 20 mg by mouth daily.     No current facility-administered medications for this visit.     Physical Exam: Vitals:   10/07/16 1442  BP: 110/88  Pulse: 78  SpO2: 97%  Weight: 132 lb 12.8 oz (60.2 kg)  Height: 5\' 2"  (1.575 m)    GEN- The patient is elderly and frail appearing, alert and oriented x 3 today.   Head- normocephalic, atraumatic Eyes-  Sclera clear, conjunctiva pink Ears- hearing intact Oropharynx- clear Lungs- Clear to ausculation bilaterally, normal work of breathing Chest- pacemaker pocket is well healed Heart- irregular rate and rhythm, no murmurs, rubs or gallops, PMI not  laterally displaced GI- soft, NT, ND, + BS Extremities- no clubbing, cyanosis, or edema  Pacemaker interrogation- reviewed in detail today,  See PACEART report  ekg today reveals rate controlled afib, nonspecific ST/T changes   Assessment and Plan:  1. Symptomatic bradycardia Normal pacemaker function See Pace Art report No changes today  2. Permanent afib Rate controlled chads2vasc score is 4  3. HTN Stable No change required today  Return to see EP NP every year Follow-up with Dr Radford Pax as scheduled  Thompson Grayer MD, [facility] 10/07/2016 3:07 PM

## 2016-10-07 NOTE — Patient Instructions (Signed)
Medication Instructions:  Your physician recommends that you continue on your current medications as directed. Please refer to the Current Medication list given to you today.    Labwork: None ordered   Testing/Procedures: None ordered'  Follow-Up: Your physician wants you to follow-up in: 12 months with Amber Seiler, NP. You will receive a reminder letter in the mail two months in advance. If you don't receive a letter, please call our office to schedule the follow-up appointment.   Any Other Special Instructions Will Be Listed Below (If Applicable).     If you need a refill on your cardiac medications before your next appointment, please call your pharmacy.   

## 2016-10-24 DIAGNOSIS — Z7901 Long term (current) use of anticoagulants: Secondary | ICD-10-CM | POA: Diagnosis not present

## 2016-10-29 DIAGNOSIS — H43813 Vitreous degeneration, bilateral: Secondary | ICD-10-CM | POA: Diagnosis not present

## 2016-10-29 DIAGNOSIS — D3132 Benign neoplasm of left choroid: Secondary | ICD-10-CM | POA: Diagnosis not present

## 2016-10-29 DIAGNOSIS — H348312 Tributary (branch) retinal vein occlusion, right eye, stable: Secondary | ICD-10-CM | POA: Diagnosis not present

## 2016-11-07 DIAGNOSIS — Z7901 Long term (current) use of anticoagulants: Secondary | ICD-10-CM | POA: Diagnosis not present

## 2016-11-19 ENCOUNTER — Encounter: Payer: Self-pay | Admitting: Cardiology

## 2016-11-19 ENCOUNTER — Ambulatory Visit (INDEPENDENT_AMBULATORY_CARE_PROVIDER_SITE_OTHER): Payer: Medicare Other | Admitting: Cardiology

## 2016-11-19 VITALS — BP 110/62 | HR 65 | Ht 62.0 in | Wt 137.4 lb

## 2016-11-19 DIAGNOSIS — I951 Orthostatic hypotension: Secondary | ICD-10-CM | POA: Diagnosis not present

## 2016-11-19 DIAGNOSIS — I482 Chronic atrial fibrillation, unspecified: Secondary | ICD-10-CM

## 2016-11-19 DIAGNOSIS — I10 Essential (primary) hypertension: Secondary | ICD-10-CM

## 2016-11-19 DIAGNOSIS — I495 Sick sinus syndrome: Secondary | ICD-10-CM

## 2016-11-19 DIAGNOSIS — G4733 Obstructive sleep apnea (adult) (pediatric): Secondary | ICD-10-CM | POA: Diagnosis not present

## 2016-11-19 NOTE — Progress Notes (Signed)
Cardiology Office Note:    Date:  11/19/2016   ID:  Anna Farrell, DOB Nov 07, 1927, MRN [id number]  PCP:  [name], MD  Cardiologist:  Fransico Him, MD   Referring MD: [name], MD   Chief Complaint  Patient presents with  . Atrial Fibrillation  . Hypertension  . Sleep Apnea    History of Present Illness:    Anna Farrell is a 83 y.o. female with a hx of chronic atrial fibrillation, orthostatic hypotension, PPM, asymptomatic PVC's and severe OSA .  She is here today for followup and is doing well.  She denies any chest pain or pressure, SOB, DOE, PND, orthopnea, LE edema, dizziness, palpitations or syncope. She is compliant with her meds and is tolerating meds with no SE.  She is doing well with her CPAP device and thinks that she has gotten used to it.  She tolerates the mask and feels the pressure is adequate.  Since going on CPAP she feels rested in the am and has no significant daytime sleepiness.  She denies any significant mouth or nasal dryness or nasal congestion.  She does not think that he snores.     Past Medical History:  Diagnosis Date  . Asymptomatic PVCs   . Cholelithiasis   . Chronic low back pain 2008   crushed vertebrae  . Colitis 5/13  . Extrinsic asthma, unspecified    LOV Dr Joya Gaskins 7/12  . Generalized osteoarthrosis, unspecified site   . History of colonic polyps   . Hyperlipidemia   . Hypertension   . Iron deficiency anemia   . Long term (current) use of anticoagulants   . Orthostatic hypotension   . Osteoporosis   . Pacemaker 10/11/06   implanted by Dr Leonia Reeves for pauses > 4 seconds with afib  . Pancreatitis   . Permanent atrial fibrillation ([place])   . Tachycardia-bradycardia syndrome ([place])   . Vasovagal syncope     Past Surgical History:  Procedure Laterality Date  . ABDOMINAL HYSTERECTOMY    . APPENDECTOMY    . CARDIAC CATHETERIZATION  2009   Normal coronary arteries  . COLONOSCOPY    . PACEMAKER INSERTION  10/11/2006   SJM  Zephyr XL DR implanted by Dr Leonia Reeves for afib with pause > 4 seconds  . VESICOVAGINAL FISTULA CLOSURE W/ TAH  1981    Current Medications: Current Meds  Medication Sig  . ALPRAZolam (XANAX) 0.25 MG tablet Take 1 tablet by mouth at bedtime.  Marland Kitchen amLODipine (NORVASC) 5 MG tablet Take 5 mg by mouth as directed.  . beclomethasone (QVAR) 40 MCG/ACT inhaler Inhale 2 puffs into the lungs 2 (two) times daily as needed (breathing, shortness of breath).  Marland Kitchen BREO ELLIPTA 100-25 MCG/INH AEPB Take as directed  . lisinopril (PRINIVIL,ZESTRIL) 20 MG tablet Take 20 mg by mouth daily.  . metoprolol succinate (TOPROL-XL) 100 MG 24 hr tablet Take 100 mg daily by mouth. Take with or immediately following a meal.  . nitroGLYCERIN (NITROSTAT) 0.4 MG SL tablet Place 0.4 mg under the tongue every 5 (five) minutes as needed for chest pain.  . pravastatin (PRAVACHOL) 40 MG tablet Take 1 tablet (40 mg total) by mouth daily. Once daily  . traMADol (ULTRAM) 50 MG tablet Take 100 mg by mouth 2 (two) times daily.  Marland Kitchen warfarin (COUMADIN) 3 MG tablet Take as directed by the coumadin clinic.     Allergies:   Codeine; Betadine [povidone iodine]; and Rocephin [ceftriaxone sodium in dextrose]   Social History  Socioeconomic History  . Marital status: Widowed    Spouse name: None  . Number of children: None  . Years of education: None  . Highest education level: None  Social Needs  . Financial resource strain: None  . Food insecurity - worry: None  . Food insecurity - inability: None  . Transportation needs - medical: None  . Transportation needs - non-medical: None  Occupational History  . Occupation: Waitress    Comment: [employer]  Tobacco Use  . Smoking status: Never Smoker  . Smokeless tobacco: Never Used  Substance and Sexual Activity  . Alcohol use: No  . Drug use: No  . Sexual activity: No  Other Topics Concern  . None  Social History Narrative  . None     Family History: The patient's family  history includes Cancer in her mother; Hypertension in her mother; Pancreatitis in her mother.  ROS:   Please see the history of present illness.    ROS  All other systems reviewed and negative.   EKGs/Labs/Other Studies Reviewed:    The following studies were reviewed today: CPAp download  EKG:  EKG is not ordered today.    Recent Labs: No results found for requested labs within last 8760 hours.   Recent Lipid Panel No results found for: CHOL, TRIG, HDL, CHOLHDL, VLDL, LDLCALC, LDLDIRECT  Physical Exam:    VS:  BP 110/62   Pulse 65   Ht 5\' 2"  (1.575 m)   Wt 137 lb 6.4 oz (62.3 kg)   SpO2 98%   BMI 25.13 kg/m     Wt Readings from Last 3 Encounters:  11/19/16 137 lb 6.4 oz (62.3 kg)  10/07/16 132 lb 12.8 oz (60.2 kg)  01/18/15 141 lb 6.4 oz (64.1 kg)     GEN:  Well nourished, well developed in no acute distress HEENT: Normal NECK: No JVD; No carotid bruits LYMPHATICS: No lymphadenopathy CARDIAC: irregularly irregular, no murmurs, rubs, gallops RESPIRATORY:  Clear to auscultation without rales, wheezing or rhonchi  ABDOMEN: Soft, non-tender, non-distended MUSCULOSKELETAL:  No edema; No deformity  SKIN: Warm and dry NEUROLOGIC:  Alert and oriented x 3 PSYCHIATRIC:  Normal affect   ASSESSMENT:    1. Chronic atrial fibrillation ([place])   2. Essential hypertension   3. BRADYCARDIA-TACHYCARDIA SYNDROME   4. Orthostatic hypotension   5. OSA (obstructive sleep apnea)    PLAN:    In order of problems listed above:  1.  Chronic atrial fibrillation - she is well rate controlled on exam today.  She will continue on Toprol XL 50mg  daily and warfarin.   2.  HTN - BP well controlled on exam.  She will continue on amlodipine 5mg  daily, Toprol XL 50mg  daily and Lisinopril 20mg  daily.   3.  Tachy-brady syndrome s/p PPM - followed in device clinic  4.  Orthostatic hypotension - well controlled with minimal sx. Continue compression hose.     Medication Adjustments/Labs  and Tests Ordered: Current medicines are reviewed at length with the patient today.  Concerns regarding medicines are outlined above.  No orders of the defined types were placed in this encounter.  No orders of the defined types were placed in this encounter.   Signed, Fransico Him, MD  11/19/2016 2:23 PM    [place]

## 2016-11-19 NOTE — Patient Instructions (Signed)

## 2016-11-22 ENCOUNTER — Other Ambulatory Visit: Payer: Self-pay | Admitting: Family Medicine

## 2016-11-22 DIAGNOSIS — J4530 Mild persistent asthma, uncomplicated: Secondary | ICD-10-CM | POA: Diagnosis not present

## 2016-11-22 DIAGNOSIS — K219 Gastro-esophageal reflux disease without esophagitis: Secondary | ICD-10-CM | POA: Diagnosis not present

## 2016-11-22 DIAGNOSIS — I482 Chronic atrial fibrillation: Secondary | ICD-10-CM | POA: Diagnosis not present

## 2016-11-22 DIAGNOSIS — R7309 Other abnormal glucose: Secondary | ICD-10-CM | POA: Diagnosis not present

## 2016-11-22 DIAGNOSIS — R1114 Bilious vomiting: Secondary | ICD-10-CM

## 2016-11-22 DIAGNOSIS — M549 Dorsalgia, unspecified: Secondary | ICD-10-CM | POA: Diagnosis not present

## 2016-11-22 DIAGNOSIS — E7800 Pure hypercholesterolemia, unspecified: Secondary | ICD-10-CM | POA: Diagnosis not present

## 2016-11-22 DIAGNOSIS — M810 Age-related osteoporosis without current pathological fracture: Secondary | ICD-10-CM | POA: Diagnosis not present

## 2016-11-22 DIAGNOSIS — Z0000 Encounter for general adult medical examination without abnormal findings: Secondary | ICD-10-CM | POA: Diagnosis not present

## 2016-11-22 DIAGNOSIS — G4733 Obstructive sleep apnea (adult) (pediatric): Secondary | ICD-10-CM | POA: Diagnosis not present

## 2016-11-22 DIAGNOSIS — Z7902 Long term (current) use of antithrombotics/antiplatelets: Secondary | ICD-10-CM | POA: Diagnosis not present

## 2016-11-22 DIAGNOSIS — I119 Hypertensive heart disease without heart failure: Secondary | ICD-10-CM | POA: Diagnosis not present

## 2016-11-22 DIAGNOSIS — D509 Iron deficiency anemia, unspecified: Secondary | ICD-10-CM | POA: Diagnosis not present

## 2016-11-25 ENCOUNTER — Ambulatory Visit
Admission: RE | Admit: 2016-11-25 | Discharge: 2016-11-25 | Disposition: A | Discharge status: Home / Self Care | Payer: Medicare Other | Source: Ambulatory Visit | Attending: Family Medicine | Admitting: Family Medicine

## 2016-11-25 DIAGNOSIS — R112 Nausea with vomiting, unspecified: Secondary | ICD-10-CM | POA: Diagnosis not present

## 2016-11-25 DIAGNOSIS — R1114 Bilious vomiting: Secondary | ICD-10-CM

## 2016-12-27 DIAGNOSIS — Z7901 Long term (current) use of anticoagulants: Secondary | ICD-10-CM | POA: Diagnosis not present

## 2017-01-17 DIAGNOSIS — H5340 Unspecified visual field defects: Secondary | ICD-10-CM | POA: Diagnosis not present

## 2017-01-17 DIAGNOSIS — H401413 Capsular glaucoma with pseudoexfoliation of lens, right eye, severe stage: Secondary | ICD-10-CM | POA: Diagnosis not present

## 2017-01-17 DIAGNOSIS — H401421 Capsular glaucoma with pseudoexfoliation of lens, left eye, mild stage: Secondary | ICD-10-CM | POA: Diagnosis not present

## 2017-01-22 DIAGNOSIS — Z7901 Long term (current) use of anticoagulants: Secondary | ICD-10-CM | POA: Diagnosis not present

## 2017-02-20 DIAGNOSIS — Z7901 Long term (current) use of anticoagulants: Secondary | ICD-10-CM | POA: Diagnosis not present

## 2017-03-20 DIAGNOSIS — Z7901 Long term (current) use of anticoagulants: Secondary | ICD-10-CM | POA: Diagnosis not present

## 2017-04-17 DIAGNOSIS — Z7901 Long term (current) use of anticoagulants: Secondary | ICD-10-CM | POA: Diagnosis not present

## 2017-06-10 ENCOUNTER — Other Ambulatory Visit: Payer: Self-pay

## 2017-06-10 ENCOUNTER — Emergency Department (HOSPITAL_COMMUNITY): Payer: Medicare Other

## 2017-06-10 ENCOUNTER — Emergency Department (HOSPITAL_COMMUNITY)
Admission: EM | Admit: 2017-06-10 | Discharge: 2017-06-10 | Disposition: A | Discharge status: Home / Self Care | Payer: Medicare Other | Attending: Emergency Medicine | Admitting: Emergency Medicine

## 2017-06-10 ENCOUNTER — Encounter (HOSPITAL_COMMUNITY): Payer: Self-pay | Admitting: Emergency Medicine

## 2017-06-10 DIAGNOSIS — R079 Chest pain, unspecified: Secondary | ICD-10-CM | POA: Diagnosis present

## 2017-06-10 DIAGNOSIS — Z5321 Procedure and treatment not carried out due to patient leaving prior to being seen by health care provider: Secondary | ICD-10-CM | POA: Insufficient documentation

## 2017-06-10 DIAGNOSIS — Z7901 Long term (current) use of anticoagulants: Secondary | ICD-10-CM | POA: Diagnosis not present

## 2017-06-10 DIAGNOSIS — R0602 Shortness of breath: Secondary | ICD-10-CM | POA: Diagnosis not present

## 2017-06-10 LAB — CBC
HCT: 44 % (ref 36.0–46.0)
Hemoglobin: 13.8 g/dL (ref 12.0–15.0)
MCH: 26 pg (ref 26.0–34.0)
MCHC: 31.4 g/dL (ref 30.0–36.0)
MCV: 82.9 fL (ref 78.0–100.0)
PLATELETS: 287 10*3/uL (ref 150–400)
RBC: 5.31 MIL/uL — ABNORMAL HIGH (ref 3.87–5.11)
RDW: 15.5 % (ref 11.5–15.5)
WBC: 6.7 10*3/uL (ref 4.0–10.5)

## 2017-06-10 LAB — I-STAT TROPONIN, ED: Troponin i, poc: 0 ng/mL (ref 0.00–0.08)

## 2017-06-10 LAB — BASIC METABOLIC PANEL
Anion gap: 9 (ref 5–15)
BUN: 16 mg/dL (ref 6–20)
CHLORIDE: 103 mmol/L (ref 101–111)
CO2: 26 mmol/L (ref 22–32)
CREATININE: 0.97 mg/dL (ref 0.44–1.00)
Calcium: 9.6 mg/dL (ref 8.9–10.3)
GFR calc Af Amer: 59 mL/min — ABNORMAL LOW (ref >60)
GFR, EST NON AFRICAN AMERICAN: 51 mL/min — AB (ref >60)
Glucose, Bld: 125 mg/dL — ABNORMAL HIGH (ref 65–99)
Potassium: 4 mmol/L (ref 3.5–5.1)
SODIUM: 138 mmol/L (ref 135–145)

## 2017-06-10 NOTE — ED Notes (Signed)
Pt's daughter came to desk to let us know she is leaving, she is planning to call the cardiologist tomorrow. RN encouraged her to stay. No change in condition, pt left.

## 2017-06-10 NOTE — ED Triage Notes (Addendum)
Pt has been having CP/SOB off and on for a few weeks and feeling weak. Pt has fallen a few times at home as well. Pt went to MD today and was found to be in Afib HR 130. Pt has hx of afib, but concerned about higher rate.

## 2017-06-11 ENCOUNTER — Telehealth: Payer: Self-pay | Admitting: Cardiology

## 2017-06-11 NOTE — Telephone Encounter (Signed)
I spoke with pt's daughter, Juliann Pulse (dpr on file) I explained to her that Dr. Radford Pax recommends that pt go to ER due elevated HR. Pt is reluctant to go the ER. I explained the importance of being evaluated and treated medically to prevent in further heart damage. She states she will check pt's HR again and if pt's HR is still elevated above 120 she will call EMS. I encouraged her to still take her mother to the ER due to symptoms of sob and lightheadedness as well. She stated understanding and thankful for the call

## 2017-06-11 NOTE — Telephone Encounter (Signed)
Follow up   Pt's daughter is calling, requesting a call back from the nurse about the issue on pt's afib. Please call [name]

## 2017-06-11 NOTE — Telephone Encounter (Signed)
New message    Patients daughter calling with concerns about afib. Went to ED on 06/10/17 [facility] after triage.    1. Are you having CP right now? NO  2. Are you experiencing any other symptoms (ex. SOB, nausea, vomiting, sweating)? SOB  3. How long have you been experiencing CP? 3 WEEKS  4. Is your CP continuous or coming and going? COMING AND GOING  5. Have you taken Nitroglycerin? NO ?

## 2017-06-11 NOTE — Telephone Encounter (Signed)
I spoke with patient. She states Dr. Brigitte Pulse sent her to ER yesterday during her office visit due to worsen afib symptoms. Pt states she has been feeling lightheaded and sob for the past few weeks, 2 weeks ago she had a fall in the kitchen. She states she felt lightheaded and didn't have time to make it back to the chair. Pt went to ER, EKG showed HR132, troponins negative, chest x-ray negative. Pt states they sent her back to waiting room and after being there for 3 hours waiting she decided to leave. Pt states she is compliant with all meds. Per Dr. Radford Pax, pt needs to go back to ER. Pt was home alone. I instructed pt to call EMS immediately to be evaluated. She stated understanding and thankful for the call

## 2017-06-11 NOTE — Telephone Encounter (Signed)
Left message to call back  

## 2017-06-25 DIAGNOSIS — D509 Iron deficiency anemia, unspecified: Secondary | ICD-10-CM | POA: Diagnosis not present

## 2017-06-25 DIAGNOSIS — M549 Dorsalgia, unspecified: Secondary | ICD-10-CM | POA: Diagnosis not present

## 2017-06-25 DIAGNOSIS — G2581 Restless legs syndrome: Secondary | ICD-10-CM | POA: Diagnosis not present

## 2017-06-25 DIAGNOSIS — R7309 Other abnormal glucose: Secondary | ICD-10-CM | POA: Diagnosis not present

## 2017-06-25 DIAGNOSIS — E7800 Pure hypercholesterolemia, unspecified: Secondary | ICD-10-CM | POA: Diagnosis not present

## 2017-06-25 DIAGNOSIS — G4733 Obstructive sleep apnea (adult) (pediatric): Secondary | ICD-10-CM | POA: Diagnosis not present

## 2017-06-25 DIAGNOSIS — Z7901 Long term (current) use of anticoagulants: Secondary | ICD-10-CM | POA: Diagnosis not present

## 2017-06-25 DIAGNOSIS — R5383 Other fatigue: Secondary | ICD-10-CM | POA: Diagnosis not present

## 2017-06-25 DIAGNOSIS — J4530 Mild persistent asthma, uncomplicated: Secondary | ICD-10-CM | POA: Diagnosis not present

## 2017-06-25 DIAGNOSIS — I482 Chronic atrial fibrillation: Secondary | ICD-10-CM | POA: Diagnosis not present

## 2017-06-25 DIAGNOSIS — I119 Hypertensive heart disease without heart failure: Secondary | ICD-10-CM | POA: Diagnosis not present

## 2017-07-18 DIAGNOSIS — H52203 Unspecified astigmatism, bilateral: Secondary | ICD-10-CM | POA: Diagnosis not present

## 2017-07-18 DIAGNOSIS — H401413 Capsular glaucoma with pseudoexfoliation of lens, right eye, severe stage: Secondary | ICD-10-CM | POA: Diagnosis not present

## 2017-07-18 DIAGNOSIS — D3132 Benign neoplasm of left choroid: Secondary | ICD-10-CM | POA: Diagnosis not present

## 2017-07-18 DIAGNOSIS — H40012 Open angle with borderline findings, low risk, left eye: Secondary | ICD-10-CM | POA: Diagnosis not present

## 2017-07-22 DIAGNOSIS — Z7901 Long term (current) use of anticoagulants: Secondary | ICD-10-CM | POA: Diagnosis not present

## 2017-08-05 DIAGNOSIS — Z7901 Long term (current) use of anticoagulants: Secondary | ICD-10-CM | POA: Diagnosis not present

## 2017-08-05 DIAGNOSIS — E039 Hypothyroidism, unspecified: Secondary | ICD-10-CM | POA: Diagnosis not present

## 2017-08-19 DIAGNOSIS — Z7901 Long term (current) use of anticoagulants: Secondary | ICD-10-CM | POA: Diagnosis not present

## 2017-08-19 DIAGNOSIS — R8290 Unspecified abnormal findings in urine: Secondary | ICD-10-CM | POA: Diagnosis not present

## 2017-09-02 DIAGNOSIS — Z7901 Long term (current) use of anticoagulants: Secondary | ICD-10-CM | POA: Diagnosis not present

## 2017-09-16 DIAGNOSIS — R001 Bradycardia, unspecified: Secondary | ICD-10-CM | POA: Diagnosis not present

## 2017-09-16 DIAGNOSIS — I10 Essential (primary) hypertension: Secondary | ICD-10-CM | POA: Diagnosis not present

## 2017-09-30 DIAGNOSIS — R8290 Unspecified abnormal findings in urine: Secondary | ICD-10-CM | POA: Diagnosis not present

## 2017-09-30 DIAGNOSIS — Z7901 Long term (current) use of anticoagulants: Secondary | ICD-10-CM | POA: Diagnosis not present

## 2017-09-30 DIAGNOSIS — Z23 Encounter for immunization: Secondary | ICD-10-CM | POA: Diagnosis not present

## 2017-10-29 DIAGNOSIS — Z7901 Long term (current) use of anticoagulants: Secondary | ICD-10-CM | POA: Diagnosis not present

## 2017-12-01 DIAGNOSIS — J4530 Mild persistent asthma, uncomplicated: Secondary | ICD-10-CM | POA: Diagnosis not present

## 2017-12-01 DIAGNOSIS — Z0000 Encounter for general adult medical examination without abnormal findings: Secondary | ICD-10-CM | POA: Diagnosis not present

## 2017-12-01 DIAGNOSIS — Z7902 Long term (current) use of antithrombotics/antiplatelets: Secondary | ICD-10-CM | POA: Diagnosis not present

## 2017-12-01 DIAGNOSIS — E039 Hypothyroidism, unspecified: Secondary | ICD-10-CM | POA: Diagnosis not present

## 2017-12-01 DIAGNOSIS — K219 Gastro-esophageal reflux disease without esophagitis: Secondary | ICD-10-CM | POA: Diagnosis not present

## 2017-12-01 DIAGNOSIS — G4733 Obstructive sleep apnea (adult) (pediatric): Secondary | ICD-10-CM | POA: Diagnosis not present

## 2017-12-01 DIAGNOSIS — M810 Age-related osteoporosis without current pathological fracture: Secondary | ICD-10-CM | POA: Diagnosis not present

## 2017-12-01 DIAGNOSIS — I482 Chronic atrial fibrillation, unspecified: Secondary | ICD-10-CM | POA: Diagnosis not present

## 2017-12-01 DIAGNOSIS — E7800 Pure hypercholesterolemia, unspecified: Secondary | ICD-10-CM | POA: Diagnosis not present

## 2017-12-01 DIAGNOSIS — R7309 Other abnormal glucose: Secondary | ICD-10-CM | POA: Diagnosis not present

## 2017-12-01 DIAGNOSIS — D509 Iron deficiency anemia, unspecified: Secondary | ICD-10-CM | POA: Diagnosis not present

## 2017-12-01 DIAGNOSIS — I119 Hypertensive heart disease without heart failure: Secondary | ICD-10-CM | POA: Diagnosis not present

## 2018-01-02 DIAGNOSIS — R350 Frequency of micturition: Secondary | ICD-10-CM | POA: Diagnosis not present

## 2018-01-02 DIAGNOSIS — Z7901 Long term (current) use of anticoagulants: Secondary | ICD-10-CM | POA: Diagnosis not present

## 2018-01-16 DIAGNOSIS — R8290 Unspecified abnormal findings in urine: Secondary | ICD-10-CM | POA: Diagnosis not present

## 2018-01-16 DIAGNOSIS — Z7901 Long term (current) use of anticoagulants: Secondary | ICD-10-CM | POA: Diagnosis not present

## 2018-01-16 DIAGNOSIS — I119 Hypertensive heart disease without heart failure: Secondary | ICD-10-CM | POA: Diagnosis not present

## 2018-01-16 DIAGNOSIS — R3915 Urgency of urination: Secondary | ICD-10-CM | POA: Diagnosis not present

## 2018-01-21 DIAGNOSIS — H353132 Nonexudative age-related macular degeneration, bilateral, intermediate dry stage: Secondary | ICD-10-CM | POA: Diagnosis not present

## 2018-01-21 DIAGNOSIS — H401413 Capsular glaucoma with pseudoexfoliation of lens, right eye, severe stage: Secondary | ICD-10-CM | POA: Diagnosis not present

## 2018-01-21 DIAGNOSIS — D3132 Benign neoplasm of left choroid: Secondary | ICD-10-CM | POA: Diagnosis not present

## 2018-01-29 DIAGNOSIS — N390 Urinary tract infection, site not specified: Secondary | ICD-10-CM | POA: Diagnosis not present

## 2018-01-29 DIAGNOSIS — I482 Chronic atrial fibrillation, unspecified: Secondary | ICD-10-CM | POA: Diagnosis not present

## 2018-01-29 DIAGNOSIS — R1310 Dysphagia, unspecified: Secondary | ICD-10-CM | POA: Diagnosis not present

## 2018-03-03 DIAGNOSIS — Z7901 Long term (current) use of anticoagulants: Secondary | ICD-10-CM | POA: Diagnosis not present

## 2018-03-31 DIAGNOSIS — Z7901 Long term (current) use of anticoagulants: Secondary | ICD-10-CM | POA: Diagnosis not present

## 2018-05-01 DIAGNOSIS — Z7901 Long term (current) use of anticoagulants: Secondary | ICD-10-CM | POA: Diagnosis not present

## 2018-05-20 ENCOUNTER — Telehealth: Payer: Self-pay | Admitting: Cardiology

## 2018-05-20 NOTE — Telephone Encounter (Signed)
LMOVM for pt to return call. Has seen another MD for her PPM. She has not seen JA since 2018. She is not remote capable she will need an in office visit.

## 2018-06-04 DIAGNOSIS — I482 Chronic atrial fibrillation, unspecified: Secondary | ICD-10-CM | POA: Diagnosis not present

## 2018-06-04 DIAGNOSIS — E039 Hypothyroidism, unspecified: Secondary | ICD-10-CM | POA: Diagnosis not present

## 2018-06-04 DIAGNOSIS — E7800 Pure hypercholesterolemia, unspecified: Secondary | ICD-10-CM | POA: Diagnosis not present

## 2018-06-04 DIAGNOSIS — M549 Dorsalgia, unspecified: Secondary | ICD-10-CM | POA: Diagnosis not present

## 2018-06-04 DIAGNOSIS — R7309 Other abnormal glucose: Secondary | ICD-10-CM | POA: Diagnosis not present

## 2018-06-04 DIAGNOSIS — D509 Iron deficiency anemia, unspecified: Secondary | ICD-10-CM | POA: Diagnosis not present

## 2018-06-04 DIAGNOSIS — J4530 Mild persistent asthma, uncomplicated: Secondary | ICD-10-CM | POA: Diagnosis not present

## 2018-06-04 DIAGNOSIS — Z7189 Other specified counseling: Secondary | ICD-10-CM | POA: Diagnosis not present

## 2018-06-04 DIAGNOSIS — I119 Hypertensive heart disease without heart failure: Secondary | ICD-10-CM | POA: Diagnosis not present

## 2018-06-08 DIAGNOSIS — D509 Iron deficiency anemia, unspecified: Secondary | ICD-10-CM | POA: Diagnosis not present

## 2018-06-08 DIAGNOSIS — R7309 Other abnormal glucose: Secondary | ICD-10-CM | POA: Diagnosis not present

## 2018-06-08 DIAGNOSIS — I119 Hypertensive heart disease without heart failure: Secondary | ICD-10-CM | POA: Diagnosis not present

## 2018-06-08 DIAGNOSIS — I482 Chronic atrial fibrillation, unspecified: Secondary | ICD-10-CM | POA: Diagnosis not present

## 2018-06-08 DIAGNOSIS — E7800 Pure hypercholesterolemia, unspecified: Secondary | ICD-10-CM | POA: Diagnosis not present

## 2018-06-08 DIAGNOSIS — E039 Hypothyroidism, unspecified: Secondary | ICD-10-CM | POA: Diagnosis not present

## 2018-06-26 DIAGNOSIS — Z7901 Long term (current) use of anticoagulants: Secondary | ICD-10-CM | POA: Diagnosis not present

## 2018-07-09 DIAGNOSIS — Z7901 Long term (current) use of anticoagulants: Secondary | ICD-10-CM | POA: Diagnosis not present

## 2018-08-27 DIAGNOSIS — Z7901 Long term (current) use of anticoagulants: Secondary | ICD-10-CM | POA: Diagnosis not present

## 2018-09-28 DIAGNOSIS — Z7901 Long term (current) use of anticoagulants: Secondary | ICD-10-CM | POA: Diagnosis not present

## 2018-09-28 DIAGNOSIS — Z23 Encounter for immunization: Secondary | ICD-10-CM | POA: Diagnosis not present

## 2018-10-26 DIAGNOSIS — Z7901 Long term (current) use of anticoagulants: Secondary | ICD-10-CM | POA: Diagnosis not present

## 2018-11-23 DIAGNOSIS — Z7901 Long term (current) use of anticoagulants: Secondary | ICD-10-CM | POA: Diagnosis not present

## 2018-12-11 DIAGNOSIS — R7309 Other abnormal glucose: Secondary | ICD-10-CM | POA: Diagnosis not present

## 2018-12-11 DIAGNOSIS — K219 Gastro-esophageal reflux disease without esophagitis: Secondary | ICD-10-CM | POA: Diagnosis not present

## 2018-12-11 DIAGNOSIS — R112 Nausea with vomiting, unspecified: Secondary | ICD-10-CM | POA: Diagnosis not present

## 2018-12-11 DIAGNOSIS — I482 Chronic atrial fibrillation, unspecified: Secondary | ICD-10-CM | POA: Diagnosis not present

## 2018-12-11 DIAGNOSIS — F411 Generalized anxiety disorder: Secondary | ICD-10-CM | POA: Diagnosis not present

## 2018-12-11 DIAGNOSIS — Z0000 Encounter for general adult medical examination without abnormal findings: Secondary | ICD-10-CM | POA: Diagnosis not present

## 2018-12-11 DIAGNOSIS — Z7902 Long term (current) use of antithrombotics/antiplatelets: Secondary | ICD-10-CM | POA: Diagnosis not present

## 2018-12-11 DIAGNOSIS — I119 Hypertensive heart disease without heart failure: Secondary | ICD-10-CM | POA: Diagnosis not present

## 2018-12-11 DIAGNOSIS — D6869 Other thrombophilia: Secondary | ICD-10-CM | POA: Diagnosis not present

## 2018-12-11 DIAGNOSIS — E039 Hypothyroidism, unspecified: Secondary | ICD-10-CM | POA: Diagnosis not present

## 2018-12-11 DIAGNOSIS — E7800 Pure hypercholesterolemia, unspecified: Secondary | ICD-10-CM | POA: Diagnosis not present

## 2018-12-11 DIAGNOSIS — J4530 Mild persistent asthma, uncomplicated: Secondary | ICD-10-CM | POA: Diagnosis not present

## 2019-02-10 ENCOUNTER — Telehealth: Payer: Self-pay | Admitting: Physician Assistant

## 2019-02-10 NOTE — Telephone Encounter (Signed)
lp's daughter message in regards to her accompanying the patient to her appointment with Jonni Sanger.

## 2019-02-10 NOTE — Telephone Encounter (Signed)
New Message     Pts daughter is calling and says she will need to assist the patient to her appt because she is in a wheel chair    Please advise

## 2019-02-11 DIAGNOSIS — Z7901 Long term (current) use of anticoagulants: Secondary | ICD-10-CM | POA: Diagnosis not present

## 2019-02-17 ENCOUNTER — Encounter: Payer: Medicare Other | Admitting: Physician Assistant

## 2019-02-22 NOTE — Progress Notes (Deleted)
Electrophysiology Office Note Date: 02/22/2019  ID:  Anna Farrell, DOB [birth year], MRN [id number]  PCP: [name], MD Primary Cardiologist: No primary care provider on file. Electrophysiologist: Dr. Rayann Heman   CC: Pacemaker follow-up  Anna Farrell is a 86 y.o. female seen today for Dr. Rayann Heman . she presents today for routine electrophysiology followup.  Since last being seen in our clinic, the patient reports doing very well.  she denies chest pain, palpitations, dyspnea, PND, orthopnea, nausea, vomiting, dizziness, syncope, edema, weight gain, or early satiety.  Device History: St. Jude {Blank single:19197::"Dual Chamber","Single Chamber","BiV"} PPM implanted *** for ***  Past Medical History:  Diagnosis Date  . Asymptomatic PVCs   . Cholelithiasis   . Chronic low back pain 2008   crushed vertebrae  . Colitis 5/13  . Extrinsic asthma, unspecified    LOV Dr Joya Gaskins 7/12  . Generalized osteoarthrosis, unspecified site   . History of colonic polyps   . Hyperlipidemia   . Hypertension   . Iron deficiency anemia   . Long term (current) use of anticoagulants   . Orthostatic hypotension   . Osteoporosis   . Pacemaker 10/11/06   implanted by Dr Leonia Reeves for pauses > 4 seconds with afib  . Pancreatitis   . Permanent atrial fibrillation ([place])   . Tachycardia-bradycardia syndrome ([place])   . Vasovagal syncope    Past Surgical History:  Procedure Laterality Date  . ABDOMINAL HYSTERECTOMY    . APPENDECTOMY    . CARDIAC CATHETERIZATION  2009   Normal coronary arteries  . CHOLECYSTECTOMY  09/17/2011   Procedure: LAPAROSCOPIC CHOLECYSTECTOMY;  Surgeon: Rolm Bookbinder, MD;  Location: [facility];  Service: General;;  . COLONOSCOPY    . ERCP N/A 04/07/2013   Procedure: ENDOSCOPIC RETROGRADE CHOLANGIOPANCREATOGRAPHY (ERCP);  Surgeon: Jeryl Columbia, MD;  Location: [facility];  Service: Endoscopy;  Laterality: N/A;  . EUS  04/07/2013   Procedure: UPPER ENDOSCOPIC ULTRASOUND (EUS)  RADIAL;  Surgeon: Jeryl Columbia, MD;  Location: [facility];  Service: Endoscopy;;  . HIP ARTHROPLASTY Left 08/17/2013   Procedure: ARTHROPLASTY BIPOLAR HIP;  Surgeon: Renette Butters, MD;  Location: [facility];  Service: Orthopedics;  Laterality: Left;  . PACEMAKER INSERTION  10/11/2006   SJM Zephyr XL DR implanted by Dr Leonia Reeves for afib with pause > 4 seconds  . VESICOVAGINAL FISTULA CLOSURE W/ TAH  1981    Current Outpatient Medications  Medication Sig Dispense Refill  . ALPRAZolam (XANAX) 0.25 MG tablet Take 1 tablet by mouth at bedtime.  0  . amLODipine (NORVASC) 5 MG tablet Take 5 mg by mouth as directed.  1  . beclomethasone (QVAR) 40 MCG/ACT inhaler Inhale 2 puffs into the lungs 2 (two) times daily as needed (breathing, shortness of breath).    Marland Kitchen BREO ELLIPTA 100-25 MCG/INH AEPB Take as directed  1  . lisinopril (PRINIVIL,ZESTRIL) 20 MG tablet Take 20 mg by mouth daily.    . metoprolol succinate (TOPROL-XL) 100 MG 24 hr tablet Take 100 mg daily by mouth. Take with or immediately following a meal.    . nitroGLYCERIN (NITROSTAT) 0.4 MG SL tablet Place 0.4 mg under the tongue every 5 (five) minutes as needed for chest pain.    . pravastatin (PRAVACHOL) 40 MG tablet Take 1 tablet (40 mg total) by mouth daily. Once daily 90 tablet 1  . traMADol (ULTRAM) 50 MG tablet Take 100 mg by mouth 2 (two) times daily.    Marland Kitchen warfarin (COUMADIN) 3 MG tablet Take  as directed by the coumadin clinic.     No current facility-administered medications for this visit.    Allergies:   Codeine, Betadine [povidone iodine], and Rocephin [ceftriaxone sodium in dextrose]   Social History: Social History   Socioeconomic History  . Marital status: Widowed    Spouse name: Not on file  . Number of children: Not on file  . Years of education: Not on file  . Highest education level: Not on file  Occupational History  . Occupation: Waitress    Comment: [employer]  Tobacco Use  . Smoking status: Never Smoker  .  Smokeless tobacco: Never Used  Substance and Sexual Activity  . Alcohol use: No  . Drug use: No  . Sexual activity: Never  Other Topics Concern  . Not on file  Social History Narrative  . Not on file   Social Determinants of Health   Financial Resource Strain:   . Difficulty of Paying Living Expenses: Not on file  Food Insecurity:   . Worried About Charity fundraiser in the Last Year: Not on file  . Ran Out of Food in the Last Year: Not on file  Transportation Needs:   . Lack of Transportation (Medical): Not on file  . Lack of Transportation (Non-Medical): Not on file  Physical Activity:   . Days of Exercise per Week: Not on file  . Minutes of Exercise per Session: Not on file  Stress:   . Feeling of Stress : Not on file  Social Connections:   . Frequency of Communication with Friends and Family: Not on file  . Frequency of Social Gatherings with Friends and Family: Not on file  . Attends Religious Services: Not on file  . Active Member of Clubs or Organizations: Not on file  . Attends Archivist Meetings: Not on file  . Marital Status: Not on file  Intimate Partner Violence:   . Fear of Current or Ex-Partner: Not on file  . Emotionally Abused: Not on file  . Physically Abused: Not on file  . Sexually Abused: Not on file    Family History: Family History  Problem Relation Age of Onset  . Hypertension Mother   . Pancreatitis Mother   . Cancer Mother        when she was younger     Review of Systems: All other systems reviewed and are otherwise negative except as noted above.  Physical Exam: There were no vitals filed for this visit.   GEN- The patient is well appearing, alert and oriented x 3 today.   HEENT: normocephalic, atraumatic; sclera clear, conjunctiva pink; hearing intact; oropharynx clear; neck supple  Lungs- Clear to ausculation bilaterally, normal work of breathing.  No wheezes, rales, rhonchi Heart- Regular rate and rhythm, no murmurs,  rubs or gallops  GI- soft, non-tender, non-distended, bowel sounds present  Extremities- no clubbing, cyanosis, or edema  MS- no significant deformity or atrophy Skin- warm and dry, no rash or lesion; PPM pocket well healed Psych- euthymic mood, full affect Neuro- strength and sensation are intact  PPM Interrogation- reviewed in detail today,  See PACEART report  EKG:  EKG is ordered today. The ekg ordered today shows ***  Recent Labs: No results found for requested labs within last 8760 hours.   Wt Readings from Last 3 Encounters:  11/19/16 137 lb 6.4 oz (62.3 kg)  10/07/16 132 lb 12.8 oz (60.2 kg)  01/18/15 141 lb 6.4 oz (64.1 kg)  Other studies Reviewed: Additional studies/ records that were reviewed today include: Echo *** shows LVEF ***, Previous EP office notes, Previous remote checks, Most recent labwork.   Assessment and Plan:  1.  Symptomatic bradycardia due to s/p St. Jude PPM  Normal PPM function See Pace Art report No changes today  2. Permanent Afib Rate controlled.  Continue coumadin for CHA2DS2VASC of 4    3. HTN No change required today.  Stable. Would not be aggressive with age and relative immobility  Current medicines are reviewed at length with the patient today.   The patient {ACTIONS; HAS/DOES NOT HAVE:19233} concerns regarding her medicines.  The following changes were made today:  {NONE DEFAULTED:18576::"none"}  Labs/ tests ordered today include: *** No orders of the defined types were placed in this encounter.    Disposition:   Follow up with EP APP in 12 Months    Signed, Annamaria Helling  02/22/2019 3:05 PM  [place] [address] [phone] (office) [phone] (fax)

## 2019-02-23 ENCOUNTER — Encounter: Payer: Medicare Other | Admitting: Student

## 2019-02-23 ENCOUNTER — Telehealth: Payer: Self-pay

## 2019-02-23 DIAGNOSIS — Z7901 Long term (current) use of anticoagulants: Secondary | ICD-10-CM | POA: Diagnosis not present

## 2019-02-23 DIAGNOSIS — I482 Chronic atrial fibrillation, unspecified: Secondary | ICD-10-CM | POA: Diagnosis not present

## 2019-02-23 DIAGNOSIS — R197 Diarrhea, unspecified: Secondary | ICD-10-CM | POA: Diagnosis not present

## 2019-02-23 DIAGNOSIS — D6869 Other thrombophilia: Secondary | ICD-10-CM | POA: Diagnosis not present

## 2019-02-23 DIAGNOSIS — R110 Nausea: Secondary | ICD-10-CM | POA: Diagnosis not present

## 2019-02-23 DIAGNOSIS — I119 Hypertensive heart disease without heart failure: Secondary | ICD-10-CM | POA: Diagnosis not present

## 2019-02-23 NOTE — Telephone Encounter (Signed)
I spoke to the patient about coming in early, but she is sick and needs to cancel.Anna Farrell

## 2019-02-23 NOTE — Telephone Encounter (Signed)
I spoke to the patient's daughter and it was recommended that she reschedules her appointment today (2/16) per Oda Kilts because of the patient's illness.  We scheduled her for 3/2 with Jonni Sanger.

## 2019-03-09 ENCOUNTER — Encounter: Payer: Self-pay | Admitting: Student

## 2019-03-09 ENCOUNTER — Other Ambulatory Visit: Payer: Self-pay

## 2019-03-09 ENCOUNTER — Ambulatory Visit (INDEPENDENT_AMBULATORY_CARE_PROVIDER_SITE_OTHER): Payer: Medicare Other | Admitting: Student

## 2019-03-09 VITALS — BP 144/90 | HR 78 | Ht 62.0 in | Wt 140.4 lb

## 2019-03-09 DIAGNOSIS — R001 Bradycardia, unspecified: Secondary | ICD-10-CM

## 2019-03-09 DIAGNOSIS — I10 Essential (primary) hypertension: Secondary | ICD-10-CM | POA: Diagnosis not present

## 2019-03-09 DIAGNOSIS — I482 Chronic atrial fibrillation, unspecified: Secondary | ICD-10-CM | POA: Diagnosis not present

## 2019-03-09 LAB — CUP PACEART INCLINIC DEVICE CHECK
Battery Impedance: 31600 Ohm
Battery Voltage: 2.59 V
Date Time Interrogation Session: 20210302142823
Implantable Lead Implant Date: 20080804
Implantable Lead Implant Date: 20080804
Implantable Lead Location: 753859
Implantable Lead Location: 753860
Implantable Pulse Generator Implant Date: 20080804
Lead Channel Impedance Value: 321 Ohm
Lead Channel Pacing Threshold Amplitude: 3.5 V
Lead Channel Pacing Threshold Pulse Width: 1 ms
Lead Channel Setting Pacing Amplitude: 5 V
Lead Channel Setting Pacing Pulse Width: 1 ms
Lead Channel Setting Sensing Sensitivity: 2 mV
Pulse Gen Model: 5826
Pulse Gen Serial Number: 1908400

## 2019-03-09 NOTE — H&P (View-Only) (Signed)
  Electrophysiology Office Note Date: 03/09/2019  ID:  Anna Farrell, DOB 09/26/1927, MRN [id number]  PCP: Shaw, Kimberlee, MD Primary Cardiologist: Dr. Turnery Electrophysiologist: Dr. Allred  CC: Pacemaker follow-up  Anna Farrell is a 86 y.o. female seen today for Dr. Allred . she presents today for routine electrophysiology followup.  Since last being seen in our clinic, the patient reports doing very well.  she denies chest pain, palpitations, dyspnea, PND, orthopnea, nausea, vomiting, dizziness, syncope, edema, weight gain, or early satiety. Her only complaint is fatigue, which is chronic per her daughter who is present.  She gets around the house and does her ADLs without difficulty, but isn't able to do much more.   Device History: St. Jude Single Chamber PPM implanted 08/2006 for symptomatic bradycardia  Past Medical History:  Diagnosis Date  . Asymptomatic PVCs   . Cholelithiasis   . Chronic low back pain 2008   crushed vertebrae  . Colitis 5/13  . Extrinsic asthma, unspecified    LOV Dr Wright 7/12  . Generalized osteoarthrosis, unspecified site   . History of colonic polyps   . Hyperlipidemia   . Hypertension   . Iron deficiency anemia   . Long term (current) use of anticoagulants   . Orthostatic hypotension   . Osteoporosis   . Pacemaker 10/11/06   implanted by Dr Edmunds for pauses > 4 seconds with afib  . Pancreatitis   . Permanent atrial fibrillation (HCC)   . Tachycardia-bradycardia syndrome (HCC)   . Vasovagal syncope    Past Surgical History:  Procedure Laterality Date  . ABDOMINAL HYSTERECTOMY    . APPENDECTOMY    . CARDIAC CATHETERIZATION  2009   Normal coronary arteries  . CHOLECYSTECTOMY  09/17/2011   Procedure: LAPAROSCOPIC CHOLECYSTECTOMY;  Surgeon: Matthew Wakefield, MD;  Location: [facility];  Service: General;;  . COLONOSCOPY    . ERCP N/A 04/07/2013   Procedure: ENDOSCOPIC RETROGRADE CHOLANGIOPANCREATOGRAPHY (ERCP);  Surgeon: Marc E Magod, MD;   Location: [facility];  Service: Endoscopy;  Laterality: N/A;  . EUS  04/07/2013   Procedure: UPPER ENDOSCOPIC ULTRASOUND (EUS) RADIAL;  Surgeon: Marc E Magod, MD;  Location: [facility];  Service: Endoscopy;;  . HIP ARTHROPLASTY Left 08/17/2013   Procedure: ARTHROPLASTY BIPOLAR HIP;  Surgeon: Timothy D Murphy, MD;  Location: [facility];  Service: Orthopedics;  Laterality: Left;  . PACEMAKER INSERTION  10/11/2006   SJM Zephyr XL DR implanted by Dr Edmunds for afib with pause > 4 seconds  . VESICOVAGINAL FISTULA CLOSURE W/ TAH  1981    Current Outpatient Medications  Medication Sig Dispense Refill  . ALPRAZolam (XANAX) 0.25 MG tablet Take 1 tablet by mouth at bedtime.  0  . beclomethasone (QVAR) 40 MCG/ACT inhaler Inhale 2 puffs into the lungs 2 (two) times daily as needed (breathing, shortness of breath).    . BREO ELLIPTA 100-25 MCG/INH AEPB Take as directed  1  . lisinopril (ZESTRIL) 40 MG tablet Take 40 mg by mouth every morning.    . metoprolol succinate (TOPROL-XL) 100 MG 24 hr tablet Take 100 mg daily by mouth. Take with or immediately following a meal.    . Multiple Vitamins-Minerals (EYE VITAMINS PO) Take 1 tablet by mouth daily.    . nitroGLYCERIN (NITROSTAT) 0.4 MG SL tablet Place 0.4 mg under the tongue every 5 (five) minutes as needed for chest pain.    . ondansetron (ZOFRAN) 8 MG tablet SMARTSIG:1 Tablet(s) By Mouth 1 to 3 Times Daily PRN    .   pravastatin (PRAVACHOL) 40 MG tablet Take 1 tablet (40 mg total) by mouth daily. Once daily 90 tablet 1  . traMADol (ULTRAM) 50 MG tablet Take 100 mg by mouth 2 (two) times daily.    Marland Kitchen warfarin (COUMADIN) 3 MG tablet Take as directed by the coumadin clinic.     No current facility-administered medications for this visit.    Allergies:   Codeine, Betadine [povidone iodine], and Rocephin [ceftriaxone sodium in dextrose]   Social History: Social History   Socioeconomic History  . Marital status: Widowed    Spouse name: Not on file  .  Number of children: Not on file  . Years of education: Not on file  . Highest education level: Not on file  Occupational History  . Occupation: Waitress    Comment: [employer]  Tobacco Use  . Smoking status: Never Smoker  . Smokeless tobacco: Never Used  Substance and Sexual Activity  . Alcohol use: No  . Drug use: No  . Sexual activity: Never  Other Topics Concern  . Not on file  Social History Narrative  . Not on file   Social Determinants of Health   Financial Resource Strain:   . Difficulty of Paying Living Expenses: Not on file  Food Insecurity:   . Worried About Charity fundraiser in the Last Year: Not on file  . Ran Out of Food in the Last Year: Not on file  Transportation Needs:   . Lack of Transportation (Medical): Not on file  . Lack of Transportation (Non-Medical): Not on file  Physical Activity:   . Days of Exercise per Week: Not on file  . Minutes of Exercise per Session: Not on file  Stress:   . Feeling of Stress : Not on file  Social Connections:   . Frequency of Communication with Friends and Family: Not on file  . Frequency of Social Gatherings with Friends and Family: Not on file  . Attends Religious Services: Not on file  . Active Member of Clubs or Organizations: Not on file  . Attends Archivist Meetings: Not on file  . Marital Status: Not on file  Intimate Partner Violence:   . Fear of Current or Ex-Partner: Not on file  . Emotionally Abused: Not on file  . Physically Abused: Not on file  . Sexually Abused: Not on file    Family History: Family History  Problem Relation Age of Onset  . Hypertension Mother   . Pancreatitis Mother   . Cancer Mother        when she was younger     Review of Systems: All other systems reviewed and are otherwise negative except as noted above.  Physical Exam: Vitals:   03/09/19 1208  BP: (!) 144/90  Pulse: 78  SpO2: 96%  Weight: 140 lb 6.4 oz (63.7 kg)  Height: _0  (1.575 m)     GEN-  The patient is well appearing, alert and oriented x 3 today.   HEENT: normocephalic, atraumatic; sclera clear, conjunctiva pink; hearing intact; oropharynx clear; neck supple  Lungs- Clear to ausculation bilaterally, normal work of breathing.  No wheezes, rales, rhonchi Heart- Regular rate and rhythm, no murmurs, rubs or gallops  GI- soft, non-tender, non-distended, bowel sounds present  Extremities- no clubbing, cyanosis, or edema  MS- no significant deformity or atrophy Skin- warm and dry, no rash or lesion; PPM pocket well healed Psych- euthymic mood, full affect Neuro- strength and sensation are intact  PPM Interrogation- reviewed in  detail today,  See PACEART report  EKG:  EKG is ordered today. The ekg ordered today shows atrial fibrillation with controlled rates at 87 bpm  Recent Labs: No results found for requested labs within last 8760 hours.   Wt Readings from Last 3 Encounters:  03/09/19 140 lb 6.4 oz (63.7 kg)  11/19/16 137 lb 6.4 oz (62.3 kg)  10/07/16 132 lb 12.8 oz (60.2 kg)     Other studies Reviewed: Additional studies/ records that were reviewed today include: Echo 05/2013 shows LVEF 55-60%, Previous EP office notes, Most recent labwork.   Assessment and Plan:  1. Symptomatic bradycardia s/p St. Jude PPM  Abnormal device function for a device that met ERI in 08/2018 RV lead with likely exit block. Impedence stable  Intermittent capture even in Unipolar at 5V _0 .0 ms. Programmed this for potentially safety margin. She had only paced 25% since her last check in 2018.  Will schedule for gen change and "lead revision" with Dr. Rayann Heman at the next available time.  Device is not remote capable, and she cancelled multiple appointments in the setting of COVID. See Claudia Desanctis Art report today.  We discussed risks and benefits of generator changed +/- new lead at length today including bleeding, infection, risk of damage to heart or lungs, heart attack, stroke, or death. Pt  verbalized understanding and agrees to proceed.   2. Permanent Afib Rates controlled CHA2DS2VASC of at least 4 on coumadin  3. HTN Continue on current regimen  Current medicines are reviewed at length with the patient today.   The patient does not have concerns regarding her medicines.  The following changes were made today:  none  Labs/ tests ordered today include:  Orders Placed This Encounter  Procedures  . EKG 12-Lead   Disposition:   Will schedule with gen change with Dr. Rayann Heman +/- new lead at next possible time. Not entirely clear if is the function of her generator at [facility] limiting capture in this device that met ERI 08/2018; suspect exit block.  Reviewed with Dr. Rayann Heman and Karilyn Cota of [employer]. Very possible that battery is far enough along that unable to deliver sufficient output to capture.  Plan will be to proceed with gen change, and further evaluate need for lead revision at that time.   Jacalyn Lefevre, PA-C  03/09/2019 2:33 PM  [address] [address] [phone] (office) [phone] (fax)

## 2019-03-09 NOTE — Progress Notes (Addendum)
Electrophysiology Office Note Date: 03/09/2019  ID:  Anna Farrell, DOB 1927/07/10, MRN [id number]  PCP: [name], MD Primary Cardiologist: Dr. Caffie Damme Electrophysiologist: Dr. Rayann Heman  CC: Pacemaker follow-up  Anna Farrell is a 86 y.o. female seen today for Dr. Rayann Heman . she presents today for routine electrophysiology followup.  Since last being seen in our clinic, the patient reports doing very well.  she denies chest pain, palpitations, dyspnea, PND, orthopnea, nausea, vomiting, dizziness, syncope, edema, weight gain, or early satiety. Her only complaint is fatigue, which is chronic per her daughter who is present.  She gets around the house and does her ADLs without difficulty, but isn't able to do much more.   Device History: St. Jude Single Chamber PPM implanted 08/2006 for symptomatic bradycardia  Past Medical History:  Diagnosis Date  . Asymptomatic PVCs   . Cholelithiasis   . Chronic low back pain 2008   crushed vertebrae  . Colitis 5/13  . Extrinsic asthma, unspecified    LOV Dr Joya Gaskins 7/12  . Generalized osteoarthrosis, unspecified site   . History of colonic polyps   . Hyperlipidemia   . Hypertension   . Iron deficiency anemia   . Long term (current) use of anticoagulants   . Orthostatic hypotension   . Osteoporosis   . Pacemaker 10/11/06   implanted by Dr Leonia Reeves for pauses > 4 seconds with afib  . Pancreatitis   . Permanent atrial fibrillation ([place])   . Tachycardia-bradycardia syndrome ([place])   . Vasovagal syncope    Past Surgical History:  Procedure Laterality Date  . ABDOMINAL HYSTERECTOMY    . APPENDECTOMY    . CARDIAC CATHETERIZATION  2009   Normal coronary arteries  . CHOLECYSTECTOMY  09/17/2011   Procedure: LAPAROSCOPIC CHOLECYSTECTOMY;  Surgeon: Rolm Bookbinder, MD;  Location: [facility];  Service: General;;  . COLONOSCOPY    . ERCP N/A 04/07/2013   Procedure: ENDOSCOPIC RETROGRADE CHOLANGIOPANCREATOGRAPHY (ERCP);  Surgeon: Jeryl Columbia, MD;   Location: [facility];  Service: Endoscopy;  Laterality: N/A;  . EUS  04/07/2013   Procedure: UPPER ENDOSCOPIC ULTRASOUND (EUS) RADIAL;  Surgeon: Jeryl Columbia, MD;  Location: [facility];  Service: Endoscopy;;  . HIP ARTHROPLASTY Left 08/17/2013   Procedure: ARTHROPLASTY BIPOLAR HIP;  Surgeon: Renette Butters, MD;  Location: [place];  Service: Orthopedics;  Laterality: Left;  . PACEMAKER INSERTION  10/11/2006   SJM Zephyr XL DR implanted by Dr Leonia Reeves for afib with pause > 4 seconds  . VESICOVAGINAL FISTULA CLOSURE W/ TAH  1981    Current Outpatient Medications  Medication Sig Dispense Refill  . ALPRAZolam (XANAX) 0.25 MG tablet Take 1 tablet by mouth at bedtime.  0  . beclomethasone (QVAR) 40 MCG/ACT inhaler Inhale 2 puffs into the lungs 2 (two) times daily as needed (breathing, shortness of breath).    Marland Kitchen BREO ELLIPTA 100-25 MCG/INH AEPB Take as directed  1  . lisinopril (ZESTRIL) 40 MG tablet Take 40 mg by mouth every morning.    . metoprolol succinate (TOPROL-XL) 100 MG 24 hr tablet Take 100 mg daily by mouth. Take with or immediately following a meal.    . Multiple Vitamins-Minerals (EYE VITAMINS PO) Take 1 tablet by mouth daily.    . nitroGLYCERIN (NITROSTAT) 0.4 MG SL tablet Place 0.4 mg under the tongue every 5 (five) minutes as needed for chest pain.    Marland Kitchen ondansetron (ZOFRAN) 8 MG tablet SMARTSIG:1 Tablet(s) By Mouth 1 to 3 Times Daily PRN    .  pravastatin (PRAVACHOL) 40 MG tablet Take 1 tablet (40 mg total) by mouth daily. Once daily 90 tablet 1  . traMADol (ULTRAM) 50 MG tablet Take 100 mg by mouth 2 (two) times daily.    Marland Kitchen warfarin (COUMADIN) 3 MG tablet Take as directed by the coumadin clinic.     No current facility-administered medications for this visit.    Allergies:   Codeine, Betadine [povidone iodine], and Rocephin [ceftriaxone sodium in dextrose]   Social History: Social History   Socioeconomic History  . Marital status: Widowed    Spouse name: Not on file  .  Number of children: Not on file  . Years of education: Not on file  . Highest education level: Not on file  Occupational History  . Occupation: Waitress    Comment: [employer]  Tobacco Use  . Smoking status: Never Smoker  . Smokeless tobacco: Never Used  Substance and Sexual Activity  . Alcohol use: No  . Drug use: No  . Sexual activity: Never  Other Topics Concern  . Not on file  Social History Narrative  . Not on file   Social Determinants of Health   Financial Resource Strain:   . Difficulty of Paying Living Expenses: Not on file  Food Insecurity:   . Worried About Charity fundraiser in the Last Year: Not on file  . Ran Out of Food in the Last Year: Not on file  Transportation Needs:   . Lack of Transportation (Medical): Not on file  . Lack of Transportation (Non-Medical): Not on file  Physical Activity:   . Days of Exercise per Week: Not on file  . Minutes of Exercise per Session: Not on file  Stress:   . Feeling of Stress : Not on file  Social Connections:   . Frequency of Communication with Friends and Family: Not on file  . Frequency of Social Gatherings with Friends and Family: Not on file  . Attends Religious Services: Not on file  . Active Member of Clubs or Organizations: Not on file  . Attends Archivist Meetings: Not on file  . Marital Status: Not on file  Intimate Partner Violence:   . Fear of Current or Ex-Partner: Not on file  . Emotionally Abused: Not on file  . Physically Abused: Not on file  . Sexually Abused: Not on file    Family History: Family History  Problem Relation Age of Onset  . Hypertension Mother   . Pancreatitis Mother   . Cancer Mother        when she was younger     Review of Systems: All other systems reviewed and are otherwise negative except as noted above.  Physical Exam: Vitals:   03/09/19 1208  BP: (!) 144/90  Pulse: 78  SpO2: 96%  Weight: 140 lb 6.4 oz (63.7 kg)  Height: _0  (1.575 m)     GEN-  The patient is well appearing, alert and oriented x 3 today.   HEENT: normocephalic, atraumatic; sclera clear, conjunctiva pink; hearing intact; oropharynx clear; neck supple  Lungs- Clear to ausculation bilaterally, normal work of breathing.  No wheezes, rales, rhonchi Heart- Regular rate and rhythm, no murmurs, rubs or gallops  GI- soft, non-tender, non-distended, bowel sounds present  Extremities- no clubbing, cyanosis, or edema  MS- no significant deformity or atrophy Skin- warm and dry, no rash or lesion; PPM pocket well healed Psych- euthymic mood, full affect Neuro- strength and sensation are intact  PPM Interrogation- reviewed in  detail today,  See PACEART report  EKG:  EKG is ordered today. The ekg ordered today shows atrial fibrillation with controlled rates at 87 bpm  Recent Labs: No results found for requested labs within last 8760 hours.   Wt Readings from Last 3 Encounters:  03/09/19 140 lb 6.4 oz (63.7 kg)  11/19/16 137 lb 6.4 oz (62.3 kg)  10/07/16 132 lb 12.8 oz (60.2 kg)     Other studies Reviewed: Additional studies/ records that were reviewed today include: Echo 05/2013 shows LVEF 55-60%, Previous EP office notes, Most recent labwork.   Assessment and Plan:  1. Symptomatic bradycardia s/p [employer]. Jude PPM  Abnormal device function for a device that met ERI in 08/2018 RV lead with likely exit block. Impedence stable  Intermittent capture even in Unipolar at 5V _0 .0 ms. Programmed this for potentially safety margin. She had only paced 25% since her last check in 2018.  Will schedule for gen change and "lead revision" with Dr. Rayann Heman at the next available time.  Device is not remote capable, and she cancelled multiple appointments in the setting of COVID. See Claudia Desanctis Art report today.  We discussed risks and benefits of generator changed +/- new lead at length today including bleeding, infection, risk of damage to heart or lungs, heart attack, stroke, or death. Pt  verbalized understanding and agrees to proceed.   2. Permanent Afib Rates controlled CHA2DS2VASC of at least 4 on coumadin  3. HTN Continue on current regimen  Current medicines are reviewed at length with the patient today.   The patient does not have concerns regarding her medicines.  The following changes were made today:  none  Labs/ tests ordered today include:  Orders Placed This Encounter  Procedures  . EKG 12-Lead   Disposition:   Will schedule with gen change with Dr. Rayann Heman +/- new lead at next possible time. Not entirely clear if is the function of her generator at [facility] limiting capture in this device that met ERI 08/2018; suspect exit block.  Reviewed with Dr. Rayann Heman and Karilyn Cota of [employer]. Very possible that battery is far enough along that unable to deliver sufficient output to capture.  Plan will be to proceed with gen change, and further evaluate need for lead revision at that time.   Jacalyn Lefevre, PA-C  03/09/2019 2:33 PM  [place] [address] [phone] (office) [phone] (fax)

## 2019-03-09 NOTE — Patient Instructions (Addendum)
Medication Instructions:  none *If you need a refill on your cardiac medications before your next appointment, please call your pharmacy*   Lab Work: none If you have labs (blood work) drawn today and your tests are completely normal, you will receive your results only by: Marland Kitchen MyChart Message (if you have MyChart) OR . A paper copy in the mail If you have any lab test that is abnormal or we need to change your treatment, we will call you to review the results.   Testing/Procedures:  We will call you to schedule Generator Change with Dr Rayann Heman   Follow-Up: To be determined At [facility], you and your health needs are our priority.  As part of our continuing mission to provide you with exceptional heart care, we have created designated Provider Care Teams.  These Care Teams include your primary Cardiologist (physician) and Advanced Practice Providers (APPs -  Physician Assistants and Nurse Practitioners) who all work together to provide you with the care you need, when you need it.  We recommend signing up for the patient portal called "MyChart".  Sign up information is provided on this After Visit Summary.  MyChart is used to connect with patients for Virtual Visits (Telemedicine).  Patients are able to view lab/test results, encounter notes, upcoming appointments, etc.  Non-urgent messages can be sent to your provider as well.   To learn more about what you can do with MyChart, go to [email or web address].

## 2019-03-10 ENCOUNTER — Telehealth: Payer: Self-pay

## 2019-03-10 DIAGNOSIS — R001 Bradycardia, unspecified: Secondary | ICD-10-CM

## 2019-03-10 DIAGNOSIS — I482 Chronic atrial fibrillation, unspecified: Secondary | ICD-10-CM

## 2019-03-10 NOTE — Telephone Encounter (Signed)
Call placed to Pt's EC  Pt scheduled for gen change on 03/16/19 at 10:30 am  Stat labs and rapid covid test scheduled for 03/15/19  Instruction letter prepared, will leave for pick up  Work up complete, will need to provide warfarin instruction after lab work on 03/15/19

## 2019-03-15 ENCOUNTER — Other Ambulatory Visit: Payer: Self-pay

## 2019-03-15 ENCOUNTER — Other Ambulatory Visit: Payer: Medicare Other | Admitting: *Deleted

## 2019-03-15 ENCOUNTER — Other Ambulatory Visit (HOSPITAL_COMMUNITY)
Admission: RE | Admit: 2019-03-15 | Discharge: 2019-03-15 | Disposition: A | Discharge status: Home / Self Care | Payer: Medicare Other | Source: Ambulatory Visit | Attending: Internal Medicine | Admitting: Internal Medicine

## 2019-03-15 DIAGNOSIS — R001 Bradycardia, unspecified: Secondary | ICD-10-CM | POA: Diagnosis not present

## 2019-03-15 DIAGNOSIS — Z20822 Contact with and (suspected) exposure to covid-19: Secondary | ICD-10-CM | POA: Insufficient documentation

## 2019-03-15 DIAGNOSIS — Z01812 Encounter for preprocedural laboratory examination: Secondary | ICD-10-CM | POA: Diagnosis not present

## 2019-03-15 DIAGNOSIS — I482 Chronic atrial fibrillation, unspecified: Secondary | ICD-10-CM | POA: Diagnosis not present

## 2019-03-15 LAB — CBC WITH DIFFERENTIAL/PLATELET
Basophils Absolute: 0 10*3/uL (ref 0.0–0.2)
Basos: 0 %
EOS (ABSOLUTE): 0.2 10*3/uL (ref 0.0–0.4)
Eos: 3 %
Hematocrit: 36.3 % (ref 34.0–46.6)
Hemoglobin: 11.8 g/dL (ref 11.1–15.9)
Lymphocytes Absolute: 1.1 10*3/uL (ref 0.7–3.1)
Lymphs: 13 %
MCH: 25.7 pg — ABNORMAL LOW (ref 26.6–33.0)
MCHC: 32.5 g/dL (ref 31.5–35.7)
MCV: 79 fL (ref 79–97)
Monocytes Absolute: 1 10*3/uL — ABNORMAL HIGH (ref 0.1–0.9)
Monocytes: 13 %
Neutrophils Absolute: 5.5 10*3/uL (ref 1.4–7.0)
Neutrophils: 71 %
Platelets: 270 10*3/uL (ref 150–450)
RBC: 4.59 x10E6/uL (ref 3.77–5.28)
RDW: 16.3 % — ABNORMAL HIGH (ref 11.7–15.4)
WBC: 7.8 10*3/uL (ref 3.4–10.8)

## 2019-03-15 LAB — BASIC METABOLIC PANEL
BUN/Creatinine Ratio: 13 (ref 12–28)
BUN: 10 mg/dL (ref 8–27)
CO2: 30 mmol/L — ABNORMAL HIGH (ref 20–29)
Calcium: 9.6 mg/dL (ref 8.7–10.3)
Chloride: 101 mmol/L (ref 96–106)
Creatinine, Ser: 0.78 mg/dL (ref 0.57–1.00)
GFR calc Af Amer: 78 mL/min/{1.73_m2} (ref >59)
GFR calc non Af Amer: 68 mL/min/{1.73_m2} (ref >59)
Glucose: 115 mg/dL — ABNORMAL HIGH (ref 65–99)
Potassium: 3.7 mmol/L (ref 3.5–5.2)
Sodium: 137 mmol/L (ref 134–144)

## 2019-03-15 LAB — PROTIME-INR
INR: 2.2 — ABNORMAL HIGH (ref 0.9–1.2)
Prothrombin Time: 23.2 s — ABNORMAL HIGH (ref 9.1–12.0)

## 2019-03-15 NOTE — Progress Notes (Signed)
Attempted to call patient to go over instructions regarding tomorrow's procedure.  Left voice mail of the following   Arrival time 0830 Nothing to eat or drink after midnight No meds AM of procedure Responsible person to drive you home and stay with you for 24 hrs Wash with special soap night before and morning of procedure

## 2019-03-15 NOTE — Telephone Encounter (Signed)
Attempted to call Pt-no answer  Call placed to [facility].  Advised per Dr. Rayann Heman to hold warfarin tonight.  All questions answered.

## 2019-03-16 ENCOUNTER — Other Ambulatory Visit: Payer: Self-pay

## 2019-03-16 ENCOUNTER — Ambulatory Visit (HOSPITAL_COMMUNITY)
Admission: RE | Disposition: A | Discharge status: Home / Self Care | Payer: Self-pay | Source: Home / Self Care | Attending: Internal Medicine

## 2019-03-16 ENCOUNTER — Ambulatory Visit (HOSPITAL_COMMUNITY)
Admission: RE | Admit: 2019-03-16 | Discharge: 2019-03-16 | Disposition: A | Discharge status: Home / Self Care | Payer: Medicare Other | Attending: Internal Medicine | Admitting: Internal Medicine

## 2019-03-16 DIAGNOSIS — I4821 Permanent atrial fibrillation: Secondary | ICD-10-CM | POA: Diagnosis not present

## 2019-03-16 DIAGNOSIS — J45909 Unspecified asthma, uncomplicated: Secondary | ICD-10-CM | POA: Diagnosis not present

## 2019-03-16 DIAGNOSIS — I495 Sick sinus syndrome: Secondary | ICD-10-CM | POA: Insufficient documentation

## 2019-03-16 DIAGNOSIS — M810 Age-related osteoporosis without current pathological fracture: Secondary | ICD-10-CM | POA: Insufficient documentation

## 2019-03-16 DIAGNOSIS — Z885 Allergy status to narcotic agent status: Secondary | ICD-10-CM | POA: Diagnosis not present

## 2019-03-16 DIAGNOSIS — Z8249 Family history of ischemic heart disease and other diseases of the circulatory system: Secondary | ICD-10-CM | POA: Diagnosis not present

## 2019-03-16 DIAGNOSIS — Z7901 Long term (current) use of anticoagulants: Secondary | ICD-10-CM | POA: Diagnosis not present

## 2019-03-16 DIAGNOSIS — Z79899 Other long term (current) drug therapy: Secondary | ICD-10-CM | POA: Diagnosis not present

## 2019-03-16 DIAGNOSIS — I10 Essential (primary) hypertension: Secondary | ICD-10-CM | POA: Diagnosis not present

## 2019-03-16 DIAGNOSIS — E785 Hyperlipidemia, unspecified: Secondary | ICD-10-CM | POA: Insufficient documentation

## 2019-03-16 DIAGNOSIS — Z45010 Encounter for checking and testing of cardiac pacemaker pulse generator [battery]: Secondary | ICD-10-CM | POA: Diagnosis not present

## 2019-03-16 DIAGNOSIS — Z7951 Long term (current) use of inhaled steroids: Secondary | ICD-10-CM | POA: Insufficient documentation

## 2019-03-16 DIAGNOSIS — M1990 Unspecified osteoarthritis, unspecified site: Secondary | ICD-10-CM | POA: Insufficient documentation

## 2019-03-16 DIAGNOSIS — Z881 Allergy status to other antibiotic agents status: Secondary | ICD-10-CM | POA: Insufficient documentation

## 2019-03-16 HISTORY — PX: PPM GENERATOR CHANGEOUT: EP1233

## 2019-03-16 LAB — PROTIME-INR
INR: 1.9 — ABNORMAL HIGH (ref 0.8–1.2)
Prothrombin Time: 21.5 seconds — ABNORMAL HIGH (ref 11.4–15.2)

## 2019-03-16 LAB — SARS CORONAVIRUS 2 (TAT 6-24 HRS): SARS Coronavirus 2: NEGATIVE

## 2019-03-16 LAB — SURGICAL PCR SCREEN
MRSA, PCR: NEGATIVE
Staphylococcus aureus: NEGATIVE

## 2019-03-16 SURGERY — PPM GENERATOR CHANGEOUT

## 2019-03-16 MED ORDER — SODIUM CHLORIDE 0.9 % IV SOLN
80.0000 mg | INTRAVENOUS | Status: AC
  Administered 2019-03-16: 80 mg

## 2019-03-16 MED ORDER — CHLORHEXIDINE GLUCONATE 4 % EX LIQD: 4.0000 "application " | Freq: Once | CUTANEOUS | Status: DC

## 2019-03-16 MED ORDER — MUPIROCIN 2 % EX OINT: 1.0000 "application " | TOPICAL_OINTMENT | Freq: Once | CUTANEOUS | Status: AC

## 2019-03-16 MED ORDER — MIDAZOLAM HCL 5 MG/5ML IJ SOLN
INTRAMUSCULAR | Status: DC | PRN
  Administered 2019-03-16: 1 mg via INTRAVENOUS

## 2019-03-16 MED ORDER — SODIUM CHLORIDE 0.9 % IV SOLN
INTRAVENOUS | Status: AC
  Filled 2019-03-16: qty 2

## 2019-03-16 MED ORDER — SODIUM CHLORIDE 0.9% FLUSH: 3.0000 mL | Freq: Two times a day (BID) | INTRAVENOUS | Status: DC

## 2019-03-16 MED ORDER — ACETAMINOPHEN 325 MG PO TABS
325.0000 mg | ORAL_TABLET | ORAL | Status: DC | PRN
  Filled 2019-03-16: qty 2

## 2019-03-16 MED ORDER — VANCOMYCIN HCL IN DEXTROSE 1-5 GM/200ML-% IV SOLN
INTRAVENOUS | Status: AC
  Filled 2019-03-16: qty 200

## 2019-03-16 MED ORDER — SODIUM CHLORIDE 0.9 % IV SOLN: 250.0000 mL | INTRAVENOUS | Status: DC | PRN

## 2019-03-16 MED ORDER — VANCOMYCIN HCL IN DEXTROSE 1-5 GM/200ML-% IV SOLN: 1000.0000 mg | INTRAVENOUS | Status: DC

## 2019-03-16 MED ORDER — LIDOCAINE HCL (PF) 1 % IJ SOLN
INTRAMUSCULAR | Status: DC | PRN
  Administered 2019-03-16: 30 mL

## 2019-03-16 MED ORDER — SODIUM CHLORIDE 0.9% FLUSH: 3.0000 mL | INTRAVENOUS | Status: DC | PRN

## 2019-03-16 MED ORDER — MIDAZOLAM HCL 5 MG/5ML IJ SOLN
INTRAMUSCULAR | Status: AC
  Filled 2019-03-16: qty 5

## 2019-03-16 MED ORDER — MUPIROCIN 2 % EX OINT
TOPICAL_OINTMENT | CUTANEOUS | Status: AC
  Administered 2019-03-16: 1 via TOPICAL
  Filled 2019-03-16: qty 22

## 2019-03-16 MED ORDER — HEPARIN (PORCINE) IN NACL 1000-0.9 UT/500ML-% IV SOLN
INTRAVENOUS | Status: DC | PRN
  Administered 2019-03-16: 500 mL

## 2019-03-16 MED ORDER — SODIUM CHLORIDE 0.9 % IV SOLN: INTRAVENOUS | Status: DC

## 2019-03-16 MED ORDER — ONDANSETRON HCL 4 MG/2ML IJ SOLN: 4.0000 mg | Freq: Four times a day (QID) | INTRAMUSCULAR | Status: DC | PRN

## 2019-03-16 SURGICAL SUPPLY — 4 items
CABLE SURGICAL S-101-97-12 (CABLE) ×2 IMPLANT
PACEMAKER ASSURITY SR-SF (Pacemaker) ×2 IMPLANT
PAD PRO RADIOLUCENT 2001M-C (PAD) ×2 IMPLANT
TRAY PACEMAKER INSERTION (PACKS) ×2 IMPLANT

## 2019-03-16 NOTE — Interval H&P Note (Signed)
History and Physical Interval Note:  03/16/2019 12:00 PM  Anna Farrell  has presented today for surgery, with the diagnosis of eol.  The various methods of treatment have been discussed with the patient and family. After consideration of risks, benefits and other options for treatment, the patient has consented to  Procedure(s): PPM GENERATOR CHANGEOUT (N/A) as a surgical intervention.  The patient's history has been reviewed, patient examined, no change in status, stable for surgery.  I have reviewed the patient's chart and labs.  Questions were answered to the patient's satisfaction.    The patient has a h/o sick sinus syndrome with pauses.  She is s/p prior PPM which has reached EOL.  I would therefore recommend pacemaker implantation at this time.  Her RV lead was not functionally recently in the office.  This may be due to primary lead failure but is more likely related to her EOL battery status. Risks, benefits, alternatives to pacemaker generator change with possible lead revision were discussed in detail with the patient today. The patient understands that the risks include but are not limited to bleeding, infection, pneumothorax, perforation, tamponade, vascular damage, renal failure, MI, stroke, death,  and lead dislodgement and wishes to proceed.      Thompson Grayer

## 2019-03-16 NOTE — Discharge Instructions (Signed)
Resume coumadin on 03/17/19   Device Battery Change, Care After  This sheet gives you information about how to care for yourself after your procedure. Your health care provider may also give you more specific instructions. If you have problems or questions, contact your health care provider. What can I expect after the procedure? After your procedure, it is common to have:  Pain or soreness at the site where the cardiac device was inserted.  Swelling at the site where the cardiac device was inserted.  You should received an information card for your new device in 4-8 weeks. Follow these instructions at home: Incision care   Keep the incision clean and dry. ? Do not take baths, swim, or use a hot tub until after your wound check.  ? Do not shower for at least 7 days, or as directed by your health care provider. ? Pat the area dry with a clean towel. Do not rub the area. This may cause bleeding.  Follow instructions from your health care provider about how to take care of your incision. Make sure you: ? Leave adhesive strips in place. These skin closures may need to stay in place for 2 weeks or longer. If adhesive strip edges start to loosen and curl up, you may trim the loose edges. Do not remove adhesive strips completely unless your health care provider tells you to do that.  Check your incision area every day for signs of infection. Check for: ? More redness, swelling, or pain. ? More fluid or blood. ? Warmth. ? Pus or a bad smell. Activity  Do not lift anything that is heavier than 10 lb (4.5 kg) until your health care provider says it is okay to do so.  For the first week, or as long as told by your health care provider: ? Avoid lifting your affected arm higher than your shoulder. ? After 1 week, Be gentle when you move your arms over your head. It is okay to raise your arm to comb your hair. ? Avoid strenuous exercise.  Ask your health care provider when it is okay  to: ? Resume your normal activities. ? Return to work or school. ? Resume sexual activity. Eating and drinking  Eat a heart-healthy diet. This should include plenty of fresh fruits and vegetables, whole grains, low-fat dairy products, and lean protein like chicken and fish.  Limit alcohol intake to no more than 1 drink a day for non-pregnant women and 2 drinks a day for men. One drink equals 12 oz of beer, 5 oz of wine, or 1 oz of hard liquor.  Check ingredients and nutrition facts on packaged foods and beverages. Avoid the following types of food: ? Food that is high in salt (sodium). ? Food that is high in saturated fat, like full-fat dairy or red meat. ? Food that is high in trans fat, like fried food. ? Food and drinks that are high in sugar. Lifestyle  Do not use any products that contain nicotine or tobacco, such as cigarettes and e-cigarettes. If you need help quitting, ask your health care provider.  Take steps to manage and control your weight.  Once cleared, get regular exercise. Aim for 150 minutes of moderate-intensity exercise (such as walking or yoga) or 75 minutes of vigorous exercise (such as running or swimming) each week.  Manage other health problems, such as diabetes or high blood pressure. Ask your health care provider how you can manage these conditions. General instructions  Do not  drive for 24 hours after your procedure if you were given a medicine to help you relax (sedative).  Take over-the-counter and prescription medicines only as told by your health care provider.  Avoid putting pressure on the area where the cardiac device was placed.  If you need an MRI after your cardiac device has been placed, be sure to tell the health care provider who orders the MRI that you have a cardiac device.  Avoid close and prolonged exposure to electrical devices that have strong magnetic fields. These include: ? Cell phones. Avoid keeping them in a pocket near the  cardiac device, and try using the ear opposite the cardiac device. ? MP3 players. ? Household appliances, like microwaves. ? Metal detectors. ? Electric generators. ? High-tension wires.  Keep all follow-up visits as directed by your health care provider. This is important. Contact a health care provider if:  You have pain at the incision site that is not relieved by over-the-counter or prescription medicines.  You have any of these around your incision site or coming from it: ? More redness, swelling, or pain. ? Fluid or blood. ? Warmth to the touch. ? Pus or a bad smell.  You have a fever.  You feel brief, occasional palpitations, light-headedness, or any symptoms that you think might be related to your heart. Get help right away if:  You experience chest pain that is different from the pain at the cardiac device site.  You develop a red streak that extends above or below the incision site.  You experience shortness of breath.  You have palpitations or an irregular heartbeat.  You have light-headedness that does not go away quickly.  You faint or have dizzy spells.  Your pulse suddenly drops or increases rapidly and does not return to normal.  You begin to gain weight and your legs and ankles swell. Summary  After your procedure, it is common to have pain, soreness, and some swelling where the cardiac device was inserted.  Make sure to keep your incision clean and dry. Follow instructions from your health care provider about how to take care of your incision.  Check your incision every day for signs of infection, such as more pain or swelling, pus or a bad smell, warmth, or leaking fluid and blood.  Avoid strenuous exercise and lifting your left arm higher than your shoulder for 2 weeks, or as long as told by your health care provider.  This information is not intended to replace advice given to you by your health care provider. Make sure you discuss any questions  you have with your health care provider.

## 2019-03-17 MED FILL — Vancomycin HCl-Dextrose IV Soln 1 GM/200ML-5%: INTRAVENOUS | Qty: 200 | Status: AC

## 2019-03-24 DIAGNOSIS — Z7901 Long term (current) use of anticoagulants: Secondary | ICD-10-CM | POA: Diagnosis not present

## 2019-03-30 ENCOUNTER — Other Ambulatory Visit: Payer: Self-pay

## 2019-03-30 ENCOUNTER — Ambulatory Visit (INDEPENDENT_AMBULATORY_CARE_PROVIDER_SITE_OTHER): Payer: Medicare Other | Admitting: *Deleted

## 2019-03-30 DIAGNOSIS — I482 Chronic atrial fibrillation, unspecified: Secondary | ICD-10-CM | POA: Diagnosis not present

## 2019-03-30 DIAGNOSIS — R001 Bradycardia, unspecified: Secondary | ICD-10-CM

## 2019-03-30 LAB — CUP PACEART INCLINIC DEVICE CHECK
Battery Remaining Longevity: 135 mo
Battery Voltage: 3.08 V
Brady Statistic RV Percent Paced: 2.8 %
Date Time Interrogation Session: 20210323153100
Implantable Lead Implant Date: 20080804
Implantable Lead Location: 753860
Implantable Pulse Generator Implant Date: 20210309
Lead Channel Impedance Value: 362.5 Ohm
Lead Channel Pacing Threshold Amplitude: 1.25 V
Lead Channel Pacing Threshold Pulse Width: 0.6 ms
Lead Channel Sensing Intrinsic Amplitude: 5.1 mV
Lead Channel Setting Pacing Amplitude: 2.5 V
Lead Channel Setting Pacing Pulse Width: 0.6 ms
Lead Channel Setting Sensing Sensitivity: 2 mV
Pulse Gen Model: 1272
Pulse Gen Serial Number: 9153964

## 2019-03-30 NOTE — Patient Instructions (Signed)
Call the office if you have any drainage , redness or increased swelling at wound site. Call if you develop a fever orchills.

## 2019-03-30 NOTE — Progress Notes (Signed)
Wound check appointment. Steri-strips removed. Wound without redness or edema. Incision edges approximated, wound well healed. Normal device function. Thresholds, sensing, and impedances consistent with implant measurements. Device programmed at 2.5 chronic lead settings. Histogram distribution appropriate for patient and level of activity. No high ventricular rates noted. Patient educated about wound care. ROV in 3 months with Dr. Rayann Heman. Next remote 06/16/19.

## 2019-04-08 DIAGNOSIS — Z7901 Long term (current) use of anticoagulants: Secondary | ICD-10-CM | POA: Diagnosis not present

## 2019-04-19 ENCOUNTER — Ambulatory Visit: Payer: Medicare Other | Admitting: Cardiology

## 2019-04-19 DIAGNOSIS — H10502 Unspecified blepharoconjunctivitis, left eye: Secondary | ICD-10-CM | POA: Diagnosis not present

## 2019-04-23 DIAGNOSIS — Z950 Presence of cardiac pacemaker: Secondary | ICD-10-CM | POA: Diagnosis not present

## 2019-04-23 DIAGNOSIS — I482 Chronic atrial fibrillation, unspecified: Secondary | ICD-10-CM | POA: Diagnosis not present

## 2019-04-23 DIAGNOSIS — H109 Unspecified conjunctivitis: Secondary | ICD-10-CM | POA: Diagnosis not present

## 2019-04-23 DIAGNOSIS — I119 Hypertensive heart disease without heart failure: Secondary | ICD-10-CM | POA: Diagnosis not present

## 2019-05-03 ENCOUNTER — Ambulatory Visit: Payer: Medicare Other | Admitting: Cardiology

## 2019-06-01 ENCOUNTER — Ambulatory Visit: Payer: Medicare Other | Admitting: Cardiology

## 2019-06-16 ENCOUNTER — Ambulatory Visit (INDEPENDENT_AMBULATORY_CARE_PROVIDER_SITE_OTHER): Payer: Medicare Other | Admitting: *Deleted

## 2019-06-16 DIAGNOSIS — R7309 Other abnormal glucose: Secondary | ICD-10-CM | POA: Diagnosis not present

## 2019-06-16 DIAGNOSIS — E7800 Pure hypercholesterolemia, unspecified: Secondary | ICD-10-CM | POA: Diagnosis not present

## 2019-06-16 DIAGNOSIS — I482 Chronic atrial fibrillation, unspecified: Secondary | ICD-10-CM | POA: Diagnosis not present

## 2019-06-16 DIAGNOSIS — I495 Sick sinus syndrome: Secondary | ICD-10-CM | POA: Diagnosis not present

## 2019-06-16 DIAGNOSIS — J4530 Mild persistent asthma, uncomplicated: Secondary | ICD-10-CM | POA: Diagnosis not present

## 2019-06-16 DIAGNOSIS — I119 Hypertensive heart disease without heart failure: Secondary | ICD-10-CM | POA: Diagnosis not present

## 2019-06-16 DIAGNOSIS — Z23 Encounter for immunization: Secondary | ICD-10-CM | POA: Diagnosis not present

## 2019-06-16 DIAGNOSIS — D6869 Other thrombophilia: Secondary | ICD-10-CM | POA: Diagnosis not present

## 2019-06-16 DIAGNOSIS — E039 Hypothyroidism, unspecified: Secondary | ICD-10-CM | POA: Diagnosis not present

## 2019-06-16 LAB — CUP PACEART REMOTE DEVICE CHECK
Battery Remaining Longevity: 129 mo
Battery Remaining Percentage: 95.5 %
Battery Voltage: 3.04 V
Brady Statistic RV Percent Paced: 3 %
Date Time Interrogation Session: 20210609031256
Implantable Lead Implant Date: 20080804
Implantable Lead Location: 753860
Implantable Pulse Generator Implant Date: 20210309
Lead Channel Impedance Value: 340 Ohm
Lead Channel Pacing Threshold Amplitude: 1.25 V
Lead Channel Pacing Threshold Pulse Width: 0.6 ms
Lead Channel Sensing Intrinsic Amplitude: 4.2 mV
Lead Channel Setting Pacing Amplitude: 2.5 V
Lead Channel Setting Pacing Pulse Width: 0.6 ms
Lead Channel Setting Sensing Sensitivity: 2 mV
Pulse Gen Model: 1272
Pulse Gen Serial Number: 9153964

## 2019-06-17 NOTE — Progress Notes (Signed)
Remote pacemaker transmission.   

## 2019-06-18 ENCOUNTER — Telehealth: Payer: Self-pay

## 2019-06-18 NOTE — Telephone Encounter (Signed)
Spoke with pts daughter about virtual visit on 06/21/19. Pts daughter stated she will assist her mother with MyChart. Pt and pts daughter confirmed virtual visit.

## 2019-06-18 NOTE — Telephone Encounter (Signed)
Left a message regarding virtual visit on 06/21/19.

## 2019-06-21 ENCOUNTER — Other Ambulatory Visit: Payer: Self-pay

## 2019-06-21 ENCOUNTER — Telehealth (INDEPENDENT_AMBULATORY_CARE_PROVIDER_SITE_OTHER): Payer: Medicare Other | Admitting: Internal Medicine

## 2019-06-21 ENCOUNTER — Encounter: Payer: Self-pay | Admitting: Internal Medicine

## 2019-06-21 ENCOUNTER — Telehealth: Payer: Self-pay

## 2019-06-21 VITALS — BP 192/111 | HR 78 | Ht 62.0 in | Wt 136.0 lb

## 2019-06-21 DIAGNOSIS — D6869 Other thrombophilia: Secondary | ICD-10-CM

## 2019-06-21 DIAGNOSIS — I10 Essential (primary) hypertension: Secondary | ICD-10-CM | POA: Diagnosis not present

## 2019-06-21 DIAGNOSIS — I482 Chronic atrial fibrillation, unspecified: Secondary | ICD-10-CM | POA: Diagnosis not present

## 2019-06-21 DIAGNOSIS — R001 Bradycardia, unspecified: Secondary | ICD-10-CM | POA: Diagnosis not present

## 2019-06-21 MED ORDER — AMLODIPINE BESYLATE 5 MG PO TABS
5.0000 mg | ORAL_TABLET | Freq: Every day | ORAL | 3 refills | Status: DC
Start: 2019-06-21 — End: 2020-06-12

## 2019-06-21 NOTE — Telephone Encounter (Signed)
-----   Message from Thompson Grayer, MD sent at 06/21/2019  3:37 PM EDT ----- Start amlodipine 5mg  daily  Return to see Joseph Art in a year

## 2019-06-21 NOTE — Progress Notes (Signed)
Electrophysiology TeleHealth Note   Due to national recommendations of social distancing due to COVID 19, an audio/video telehealth visit is felt to be most appropriate for this patient at this time.  See MyChart message from today for the patient's consent to telehealth for [facility].  Date:  06/21/2019   ID:  Anna Farrell, DOB 12/28/1927, MRN [id number]  Location: patient's home  Provider location:  [place]  Evaluation Performed: Follow-up visit  PCP:  [name], MD   Electrophysiologist:  Dr Rayann Heman  Chief Complaint:  palpitations  History of Present Illness:    Anna Farrell is a 86 y.o. female who presents via telehealth conferencing today.  Since her generator change, the patient reports doing very well.  Her incision healed well.  Today, she denies symptoms of palpitations, chest pain, shortness of breath,  lower extremity edema, dizziness, presyncope, or syncope.  The patient is otherwise without complaint today.   Past Medical History:  Diagnosis Date  . Asymptomatic PVCs   . Cholelithiasis   . Chronic low back pain 2008   crushed vertebrae  . Colitis 5/13  . Extrinsic asthma, unspecified    LOV Dr Joya Gaskins 7/12  . Generalized osteoarthrosis, unspecified site   . History of colonic polyps   . Hyperlipidemia   . Hypertension   . Iron deficiency anemia   . Long term (current) use of anticoagulants   . Orthostatic hypotension   . Osteoporosis   . Pacemaker 10/11/06   implanted by Dr Leonia Reeves for pauses > 4 seconds with afib  . Pancreatitis   . Permanent atrial fibrillation ([place])   . Tachycardia-bradycardia syndrome ([place])   . Vasovagal syncope     Past Surgical History:  Procedure Laterality Date  . ABDOMINAL HYSTERECTOMY    . APPENDECTOMY    . CARDIAC CATHETERIZATION  2009   Normal coronary arteries  . CHOLECYSTECTOMY  09/17/2011   Procedure: LAPAROSCOPIC CHOLECYSTECTOMY;  Surgeon: Rolm Bookbinder, MD;  Location: [facility];  Service:  General;;  . COLONOSCOPY    . ERCP N/A 04/07/2013   Procedure: ENDOSCOPIC RETROGRADE CHOLANGIOPANCREATOGRAPHY (ERCP);  Surgeon: Jeryl Columbia, MD;  Location: [facility];  Service: Endoscopy;  Laterality: N/A;  . EUS  04/07/2013   Procedure: UPPER ENDOSCOPIC ULTRASOUND (EUS) RADIAL;  Surgeon: Jeryl Columbia, MD;  Location: [facility];  Service: Endoscopy;;  . HIP ARTHROPLASTY Left 08/17/2013   Procedure: ARTHROPLASTY BIPOLAR HIP;  Surgeon: Renette Butters, MD;  Location: [place];  Service: Orthopedics;  Laterality: Left;  . PACEMAKER INSERTION  10/11/2006   SJM Zephyr XL DR implanted by Dr Leonia Reeves for afib with pause > 4 seconds  . PPM GENERATOR CHANGEOUT N/A 03/16/2019   Procedure: PPM GENERATOR CHANGEOUT;  Surgeon: Thompson Grayer, MD;  Location: [facility];  Service: Cardiovascular;  Laterality: N/A;  . VESICOVAGINAL FISTULA CLOSURE W/ TAH  1981    Current Outpatient Medications  Medication Sig Dispense Refill  . albuterol (VENTOLIN HFA) 108 (90 Base) MCG/ACT inhaler Inhale 2 puffs into the lungs every 6 (six) hours as needed for wheezing or shortness of breath. Pro    . ALPRAZolam (XANAX) 0.25 MG tablet Take 0.25-0.5 mg by mouth at bedtime.   0  . BREO ELLIPTA 100-25 MCG/INH AEPB Inhale 1 puff into the lungs daily as needed (Shortness of Breath).   1  . latanoprost (XALATAN) 0.005 % ophthalmic solution Place 1 drop into the right eye every evening.    Marland Kitchen lisinopril (ZESTRIL)  40 MG tablet Take 40 mg by mouth daily.     . metoprolol succinate (TOPROL-XL) 100 MG 24 hr tablet Take 100 mg daily by mouth. Take with or immediately following a meal.    . Multiple Vitamins-Minerals (EYE VITAMINS PO) Take 1 tablet by mouth in the morning and at bedtime. preservision    . nitroGLYCERIN (NITROSTAT) 0.4 MG SL tablet Place 0.4 mg under the tongue every 5 (five) minutes as needed for chest pain.    Marland Kitchen ondansetron (ZOFRAN) 8 MG tablet Take 8 mg by mouth daily as needed (Car sick).     . pravastatin  (PRAVACHOL) 40 MG tablet Take 1 tablet (40 mg total) by mouth daily. Once daily (Patient taking differently: Take 40 mg by mouth daily. ) 90 tablet 1  . traMADol (ULTRAM) 50 MG tablet Take 100 mg by mouth 2 (two) times daily.    Marland Kitchen warfarin (COUMADIN) 3 MG tablet Take 3 mg by mouth See admin instructions. Take 1.5 mg on Sunday All the other days take 3 mg     No current facility-administered medications for this visit.    Allergies:   Codeine, Betadine [povidone iodine], and Rocephin [ceftriaxone sodium in dextrose]   Social History:  The patient  reports that she has never smoked. She has never used smokeless tobacco. She reports that she does not drink alcohol and does not use drugs.   ROS:  Please see the history of present illness.   All other systems are personally reviewed and negative.    Exam:    Vital Signs:  BP (!) 192/111   Pulse 78   Ht 5\' 2"  (1.575 m)   Wt 136 lb (61.7 kg)   BMI 24.87 kg/m   Well sounding and appearing, alert and conversant, regular work of breathing,  good skin color Eyes- anicteric, neuro- grossly intact, skin- no apparent rash or lesions or cyanosis, mouth- oral mucosa is pink  Labs/Other Tests and Data Reviewed:    Recent Labs: 03/15/2019: BUN 10; Creatinine, Ser 0.78; Hemoglobin 11.8; Platelets 270; Potassium 3.7; Sodium 137   Wt Readings from Last 3 Encounters:  06/21/19 136 lb (61.7 kg)  03/16/19 138 lb (62.6 kg)  03/09/19 140 lb 6.4 oz (63.7 kg)     Last device remote is reviewed from [place] PDF which reveals normal device function    ASSESSMENT & PLAN:    1.  Second degree AV block Remotes are up to date Doing well since her generator change  2. Permanent afib Stable On coumadin Rate controlled  3. Hypertensive cardiomyopathy Elevated today She reports that her BP is frequently elevated Add norvasc 5mg  daily today which can be uptitrated by primary care   Risks, benefits and potential toxicities for medications prescribed  and/or refilled reviewed with patient today.   Follow-up:  Return to see EP APP in a year   Patient Risk:  after full review of this patients clinical status, I feel that they are at moderate risk at this time.  Today, I have spent 15 minutes with the patient with telehealth technology discussing arrhythmia management .    SignedThompson Grayer, MD  06/21/2019 3:33 PM     [address] [place] [address] [phone] (office) [phone] (fax)

## 2019-06-21 NOTE — Telephone Encounter (Signed)
New medication order entered.  Recall entered.  Called and spoke with Pt's daughter advising of medication sent to pharmacy.

## 2019-07-28 DIAGNOSIS — Z7901 Long term (current) use of anticoagulants: Secondary | ICD-10-CM | POA: Diagnosis not present

## 2019-08-25 DIAGNOSIS — Z23 Encounter for immunization: Secondary | ICD-10-CM | POA: Diagnosis not present

## 2019-08-25 DIAGNOSIS — Z7901 Long term (current) use of anticoagulants: Secondary | ICD-10-CM | POA: Diagnosis not present

## 2019-09-15 ENCOUNTER — Ambulatory Visit (INDEPENDENT_AMBULATORY_CARE_PROVIDER_SITE_OTHER): Payer: Medicare Other | Admitting: *Deleted

## 2019-09-15 DIAGNOSIS — R001 Bradycardia, unspecified: Secondary | ICD-10-CM

## 2019-09-15 LAB — CUP PACEART REMOTE DEVICE CHECK
Battery Remaining Longevity: 128 mo
Battery Remaining Percentage: 95.5 %
Battery Voltage: 3.04 V
Brady Statistic RV Percent Paced: 5.1 %
Date Time Interrogation Session: 20210908020016
Implantable Lead Implant Date: 20080804
Implantable Lead Location: 753860
Implantable Pulse Generator Implant Date: 20210309
Lead Channel Impedance Value: 330 Ohm
Lead Channel Pacing Threshold Amplitude: 1.25 V
Lead Channel Pacing Threshold Pulse Width: 0.6 ms
Lead Channel Sensing Intrinsic Amplitude: 3.3 mV
Lead Channel Setting Pacing Amplitude: 2.5 V
Lead Channel Setting Pacing Pulse Width: 0.6 ms
Lead Channel Setting Sensing Sensitivity: 2 mV
Pulse Gen Model: 1272
Pulse Gen Serial Number: 9153964

## 2019-09-16 NOTE — Progress Notes (Signed)
Remote pacemaker transmission.   

## 2019-09-23 DIAGNOSIS — Z23 Encounter for immunization: Secondary | ICD-10-CM | POA: Diagnosis not present

## 2019-09-23 DIAGNOSIS — Z7901 Long term (current) use of anticoagulants: Secondary | ICD-10-CM | POA: Diagnosis not present

## 2019-10-22 DIAGNOSIS — Z7901 Long term (current) use of anticoagulants: Secondary | ICD-10-CM | POA: Diagnosis not present

## 2019-11-03 DIAGNOSIS — Z23 Encounter for immunization: Secondary | ICD-10-CM | POA: Diagnosis not present

## 2019-11-23 DIAGNOSIS — Z7901 Long term (current) use of anticoagulants: Secondary | ICD-10-CM | POA: Diagnosis not present

## 2019-12-10 DIAGNOSIS — H353133 Nonexudative age-related macular degeneration, bilateral, advanced atrophic without subfoveal involvement: Secondary | ICD-10-CM | POA: Diagnosis not present

## 2019-12-10 DIAGNOSIS — D3132 Benign neoplasm of left choroid: Secondary | ICD-10-CM | POA: Diagnosis not present

## 2019-12-10 DIAGNOSIS — H401413 Capsular glaucoma with pseudoexfoliation of lens, right eye, severe stage: Secondary | ICD-10-CM | POA: Diagnosis not present

## 2019-12-10 DIAGNOSIS — H52202 Unspecified astigmatism, left eye: Secondary | ICD-10-CM | POA: Diagnosis not present

## 2019-12-15 ENCOUNTER — Ambulatory Visit (INDEPENDENT_AMBULATORY_CARE_PROVIDER_SITE_OTHER): Payer: Medicare Other

## 2019-12-15 DIAGNOSIS — I495 Sick sinus syndrome: Secondary | ICD-10-CM

## 2019-12-16 LAB — CUP PACEART REMOTE DEVICE CHECK
Battery Remaining Longevity: 128 mo
Battery Remaining Percentage: 95.5 %
Battery Voltage: 3.04 V
Brady Statistic RV Percent Paced: 6.4 %
Date Time Interrogation Session: 20211208034549
Implantable Lead Implant Date: 20080804
Implantable Lead Location: 753860
Implantable Pulse Generator Implant Date: 20210309
Lead Channel Impedance Value: 360 Ohm
Lead Channel Pacing Threshold Amplitude: 1.25 V
Lead Channel Pacing Threshold Pulse Width: 0.6 ms
Lead Channel Sensing Intrinsic Amplitude: 3.9 mV
Lead Channel Setting Pacing Amplitude: 2.5 V
Lead Channel Setting Pacing Pulse Width: 0.6 ms
Lead Channel Setting Sensing Sensitivity: 2 mV
Pulse Gen Model: 1272
Pulse Gen Serial Number: 9153964

## 2019-12-24 DIAGNOSIS — Z7901 Long term (current) use of anticoagulants: Secondary | ICD-10-CM | POA: Diagnosis not present

## 2019-12-27 NOTE — Progress Notes (Signed)
Remote pacemaker transmission.   

## 2020-01-19 DIAGNOSIS — Z0000 Encounter for general adult medical examination without abnormal findings: Secondary | ICD-10-CM | POA: Diagnosis not present

## 2020-01-19 DIAGNOSIS — E039 Hypothyroidism, unspecified: Secondary | ICD-10-CM | POA: Diagnosis not present

## 2020-01-19 DIAGNOSIS — K219 Gastro-esophageal reflux disease without esophagitis: Secondary | ICD-10-CM | POA: Diagnosis not present

## 2020-01-19 DIAGNOSIS — G4733 Obstructive sleep apnea (adult) (pediatric): Secondary | ICD-10-CM | POA: Diagnosis not present

## 2020-01-19 DIAGNOSIS — I119 Hypertensive heart disease without heart failure: Secondary | ICD-10-CM | POA: Diagnosis not present

## 2020-01-19 DIAGNOSIS — E7800 Pure hypercholesterolemia, unspecified: Secondary | ICD-10-CM | POA: Diagnosis not present

## 2020-01-19 DIAGNOSIS — R7309 Other abnormal glucose: Secondary | ICD-10-CM | POA: Diagnosis not present

## 2020-01-19 DIAGNOSIS — I482 Chronic atrial fibrillation, unspecified: Secondary | ICD-10-CM | POA: Diagnosis not present

## 2020-01-19 DIAGNOSIS — F411 Generalized anxiety disorder: Secondary | ICD-10-CM | POA: Diagnosis not present

## 2020-01-19 DIAGNOSIS — D6869 Other thrombophilia: Secondary | ICD-10-CM | POA: Diagnosis not present

## 2020-01-19 DIAGNOSIS — G2581 Restless legs syndrome: Secondary | ICD-10-CM | POA: Diagnosis not present

## 2020-01-19 DIAGNOSIS — J4530 Mild persistent asthma, uncomplicated: Secondary | ICD-10-CM | POA: Diagnosis not present

## 2020-02-02 DIAGNOSIS — Z7901 Long term (current) use of anticoagulants: Secondary | ICD-10-CM | POA: Diagnosis not present

## 2020-03-01 DIAGNOSIS — Z7901 Long term (current) use of anticoagulants: Secondary | ICD-10-CM | POA: Diagnosis not present

## 2020-03-15 ENCOUNTER — Ambulatory Visit (INDEPENDENT_AMBULATORY_CARE_PROVIDER_SITE_OTHER): Payer: Medicare Other

## 2020-03-15 DIAGNOSIS — I495 Sick sinus syndrome: Secondary | ICD-10-CM | POA: Diagnosis not present

## 2020-03-16 DIAGNOSIS — H53413 Scotoma involving central area, bilateral: Secondary | ICD-10-CM | POA: Diagnosis not present

## 2020-03-17 LAB — CUP PACEART REMOTE DEVICE CHECK
Battery Remaining Longevity: 125 mo
Battery Remaining Percentage: 95.5 %
Battery Voltage: 3.02 V
Brady Statistic RV Percent Paced: 6.7 %
Date Time Interrogation Session: 20220309020019
Implantable Lead Implant Date: 20080804
Implantable Lead Location: 753860
Implantable Pulse Generator Implant Date: 20210309
Lead Channel Impedance Value: 330 Ohm
Lead Channel Pacing Threshold Amplitude: 1.25 V
Lead Channel Pacing Threshold Pulse Width: 0.6 ms
Lead Channel Sensing Intrinsic Amplitude: 3.4 mV
Lead Channel Setting Pacing Amplitude: 2.5 V
Lead Channel Setting Pacing Pulse Width: 0.6 ms
Lead Channel Setting Sensing Sensitivity: 2 mV
Pulse Gen Model: 1272
Pulse Gen Serial Number: 9153964

## 2020-03-22 DIAGNOSIS — H53413 Scotoma involving central area, bilateral: Secondary | ICD-10-CM | POA: Diagnosis not present

## 2020-03-23 NOTE — Progress Notes (Signed)
Remote pacemaker transmission.   

## 2020-03-30 DIAGNOSIS — H53413 Scotoma involving central area, bilateral: Secondary | ICD-10-CM | POA: Diagnosis not present

## 2020-04-04 DIAGNOSIS — Z7901 Long term (current) use of anticoagulants: Secondary | ICD-10-CM | POA: Diagnosis not present

## 2020-04-06 DIAGNOSIS — H53413 Scotoma involving central area, bilateral: Secondary | ICD-10-CM | POA: Diagnosis not present

## 2020-04-18 DIAGNOSIS — Z7901 Long term (current) use of anticoagulants: Secondary | ICD-10-CM | POA: Diagnosis not present

## 2020-05-02 DIAGNOSIS — Z7901 Long term (current) use of anticoagulants: Secondary | ICD-10-CM | POA: Diagnosis not present

## 2020-05-16 DIAGNOSIS — H53413 Scotoma involving central area, bilateral: Secondary | ICD-10-CM | POA: Diagnosis not present

## 2020-05-18 DIAGNOSIS — Z7901 Long term (current) use of anticoagulants: Secondary | ICD-10-CM | POA: Diagnosis not present

## 2020-06-01 DIAGNOSIS — Z7901 Long term (current) use of anticoagulants: Secondary | ICD-10-CM | POA: Diagnosis not present

## 2020-06-06 DIAGNOSIS — H53413 Scotoma involving central area, bilateral: Secondary | ICD-10-CM | POA: Diagnosis not present

## 2020-06-11 ENCOUNTER — Other Ambulatory Visit: Payer: Self-pay | Admitting: Internal Medicine

## 2020-06-12 ENCOUNTER — Other Ambulatory Visit: Payer: Self-pay | Admitting: Internal Medicine

## 2020-06-14 ENCOUNTER — Ambulatory Visit (INDEPENDENT_AMBULATORY_CARE_PROVIDER_SITE_OTHER): Payer: Medicare Other

## 2020-06-14 DIAGNOSIS — I495 Sick sinus syndrome: Secondary | ICD-10-CM | POA: Diagnosis not present

## 2020-06-14 LAB — CUP PACEART REMOTE DEVICE CHECK
Battery Remaining Longevity: 124 mo
Battery Remaining Percentage: 95 %
Battery Voltage: 3.02 V
Brady Statistic RV Percent Paced: 6.9 %
Date Time Interrogation Session: 20220608020015
Implantable Lead Implant Date: 20080804
Implantable Lead Location: 753860
Implantable Pulse Generator Implant Date: 20210309
Lead Channel Impedance Value: 330 Ohm
Lead Channel Pacing Threshold Amplitude: 1.25 V
Lead Channel Pacing Threshold Pulse Width: 0.6 ms
Lead Channel Sensing Intrinsic Amplitude: 3.6 mV
Lead Channel Setting Pacing Amplitude: 2.5 V
Lead Channel Setting Pacing Pulse Width: 0.6 ms
Lead Channel Setting Sensing Sensitivity: 2 mV
Pulse Gen Model: 1272
Pulse Gen Serial Number: 9153964

## 2020-06-29 DIAGNOSIS — Z7901 Long term (current) use of anticoagulants: Secondary | ICD-10-CM | POA: Diagnosis not present

## 2020-06-30 DIAGNOSIS — H353133 Nonexudative age-related macular degeneration, bilateral, advanced atrophic without subfoveal involvement: Secondary | ICD-10-CM | POA: Diagnosis not present

## 2020-06-30 DIAGNOSIS — H548 Legal blindness, as defined in USA: Secondary | ICD-10-CM | POA: Diagnosis not present

## 2020-06-30 DIAGNOSIS — H401413 Capsular glaucoma with pseudoexfoliation of lens, right eye, severe stage: Secondary | ICD-10-CM | POA: Diagnosis not present

## 2020-07-06 NOTE — Progress Notes (Signed)
Remote pacemaker transmission.   

## 2020-07-11 ENCOUNTER — Other Ambulatory Visit: Payer: Self-pay | Admitting: Internal Medicine

## 2020-07-13 MED ORDER — AMLODIPINE BESYLATE 5 MG PO TABS: 5.0000 mg | ORAL_TABLET | Freq: Every day | ORAL | 0 refills | Status: DC

## 2020-07-13 NOTE — Addendum Note (Signed)
Addended by: Ulice Brilliant T on: 07/13/2020 01:23 PM   Modules accepted: Orders

## 2020-07-26 DIAGNOSIS — I482 Chronic atrial fibrillation, unspecified: Secondary | ICD-10-CM | POA: Diagnosis not present

## 2020-07-26 DIAGNOSIS — G2581 Restless legs syndrome: Secondary | ICD-10-CM | POA: Diagnosis not present

## 2020-07-26 DIAGNOSIS — R7309 Other abnormal glucose: Secondary | ICD-10-CM | POA: Diagnosis not present

## 2020-07-26 DIAGNOSIS — E039 Hypothyroidism, unspecified: Secondary | ICD-10-CM | POA: Diagnosis not present

## 2020-07-26 DIAGNOSIS — D6869 Other thrombophilia: Secondary | ICD-10-CM | POA: Diagnosis not present

## 2020-07-26 DIAGNOSIS — H547 Unspecified visual loss: Secondary | ICD-10-CM | POA: Diagnosis not present

## 2020-07-26 DIAGNOSIS — I119 Hypertensive heart disease without heart failure: Secondary | ICD-10-CM | POA: Diagnosis not present

## 2020-07-26 DIAGNOSIS — M150 Primary generalized (osteo)arthritis: Secondary | ICD-10-CM | POA: Diagnosis not present

## 2020-07-26 DIAGNOSIS — E7800 Pure hypercholesterolemia, unspecified: Secondary | ICD-10-CM | POA: Diagnosis not present

## 2020-07-26 DIAGNOSIS — F411 Generalized anxiety disorder: Secondary | ICD-10-CM | POA: Diagnosis not present

## 2020-07-27 ENCOUNTER — Observation Stay (HOSPITAL_COMMUNITY)
Admission: EM | Admit: 2020-07-27 | Discharge: 2020-07-28 | Disposition: A | Discharge status: Home / Self Care | Payer: Medicare Other | Attending: Internal Medicine | Admitting: Internal Medicine

## 2020-07-27 ENCOUNTER — Emergency Department (HOSPITAL_COMMUNITY): Payer: Medicare Other

## 2020-07-27 ENCOUNTER — Other Ambulatory Visit: Payer: Self-pay

## 2020-07-27 ENCOUNTER — Encounter (HOSPITAL_COMMUNITY): Payer: Self-pay | Admitting: Internal Medicine

## 2020-07-27 DIAGNOSIS — Z950 Presence of cardiac pacemaker: Secondary | ICD-10-CM | POA: Insufficient documentation

## 2020-07-27 DIAGNOSIS — I4891 Unspecified atrial fibrillation: Secondary | ICD-10-CM | POA: Diagnosis not present

## 2020-07-27 DIAGNOSIS — R61 Generalized hyperhidrosis: Secondary | ICD-10-CM | POA: Diagnosis not present

## 2020-07-27 DIAGNOSIS — I491 Atrial premature depolarization: Secondary | ICD-10-CM | POA: Diagnosis not present

## 2020-07-27 DIAGNOSIS — Z79899 Other long term (current) drug therapy: Secondary | ICD-10-CM | POA: Insufficient documentation

## 2020-07-27 DIAGNOSIS — Z20822 Contact with and (suspected) exposure to covid-19: Secondary | ICD-10-CM | POA: Diagnosis not present

## 2020-07-27 DIAGNOSIS — Z7901 Long term (current) use of anticoagulants: Secondary | ICD-10-CM | POA: Insufficient documentation

## 2020-07-27 DIAGNOSIS — R079 Chest pain, unspecified: Secondary | ICD-10-CM

## 2020-07-27 DIAGNOSIS — R0789 Other chest pain: Secondary | ICD-10-CM | POA: Diagnosis not present

## 2020-07-27 DIAGNOSIS — R0602 Shortness of breath: Secondary | ICD-10-CM | POA: Diagnosis not present

## 2020-07-27 DIAGNOSIS — R072 Precordial pain: Principal | ICD-10-CM | POA: Insufficient documentation

## 2020-07-27 DIAGNOSIS — I10 Essential (primary) hypertension: Secondary | ICD-10-CM | POA: Insufficient documentation

## 2020-07-27 DIAGNOSIS — I4821 Permanent atrial fibrillation: Secondary | ICD-10-CM | POA: Diagnosis not present

## 2020-07-27 DIAGNOSIS — E785 Hyperlipidemia, unspecified: Secondary | ICD-10-CM | POA: Diagnosis present

## 2020-07-27 LAB — CBC WITH DIFFERENTIAL/PLATELET
Abs Immature Granulocytes: 0.04 10*3/uL (ref 0.00–0.07)
Basophils Absolute: 0 10*3/uL (ref 0.0–0.1)
Basophils Relative: 0 %
Eosinophils Absolute: 0.1 10*3/uL (ref 0.0–0.5)
Eosinophils Relative: 1 %
HCT: 39.9 % (ref 36.0–46.0)
Hemoglobin: 12.5 g/dL (ref 12.0–15.0)
Immature Granulocytes: 0 %
Lymphocytes Relative: 9 %
Lymphs Abs: 1 10*3/uL (ref 0.7–4.0)
MCH: 26.8 pg (ref 26.0–34.0)
MCHC: 31.3 g/dL (ref 30.0–36.0)
MCV: 85.6 fL (ref 80.0–100.0)
Monocytes Absolute: 1.4 10*3/uL — ABNORMAL HIGH (ref 0.1–1.0)
Monocytes Relative: 13 %
Neutro Abs: 8.3 10*3/uL — ABNORMAL HIGH (ref 1.7–7.7)
Neutrophils Relative %: 77 %
Platelets: 259 10*3/uL (ref 150–400)
RBC: 4.66 MIL/uL (ref 3.87–5.11)
RDW: 16.3 % — ABNORMAL HIGH (ref 11.5–15.5)
WBC: 10.7 10*3/uL — ABNORMAL HIGH (ref 4.0–10.5)
nRBC: 0 % (ref 0.0–0.2)

## 2020-07-27 LAB — LIPASE, BLOOD: Lipase: 23 U/L (ref 11–51)

## 2020-07-27 LAB — COMPREHENSIVE METABOLIC PANEL
ALT: 10 U/L (ref 0–44)
AST: 24 U/L (ref 15–41)
Albumin: 3.1 g/dL — ABNORMAL LOW (ref 3.5–5.0)
Alkaline Phosphatase: 68 U/L (ref 38–126)
Anion gap: 10 (ref 5–15)
BUN: 18 mg/dL (ref 8–23)
CO2: 25 mmol/L (ref 22–32)
Calcium: 9.6 mg/dL (ref 8.9–10.3)
Chloride: 102 mmol/L (ref 98–111)
Creatinine, Ser: 0.94 mg/dL (ref 0.44–1.00)
GFR, Estimated: 58 mL/min — ABNORMAL LOW (ref >60)
Glucose, Bld: 142 mg/dL — ABNORMAL HIGH (ref 70–99)
Potassium: 4.2 mmol/L (ref 3.5–5.1)
Sodium: 137 mmol/L (ref 135–145)
Total Bilirubin: 1 mg/dL (ref 0.3–1.2)
Total Protein: 5.8 g/dL — ABNORMAL LOW (ref 6.5–8.1)

## 2020-07-27 LAB — PROTIME-INR
INR: 2.7 — ABNORMAL HIGH (ref 0.8–1.2)
Prothrombin Time: 28.8 seconds — ABNORMAL HIGH (ref 11.4–15.2)

## 2020-07-27 LAB — D-DIMER, QUANTITATIVE: D-Dimer, Quant: 0.28 ug/mL-FEU (ref 0.00–0.50)

## 2020-07-27 LAB — RESP PANEL BY RT-PCR (FLU A&B, COVID) ARPGX2
Influenza A by PCR: NEGATIVE
Influenza B by PCR: NEGATIVE
SARS Coronavirus 2 by RT PCR: NEGATIVE

## 2020-07-27 LAB — LACTIC ACID, PLASMA
Lactic Acid, Venous: 2.1 mmol/L (ref 0.5–1.9)
Lactic Acid, Venous: 1.3 mmol/L (ref 0.5–1.9)

## 2020-07-27 LAB — BRAIN NATRIURETIC PEPTIDE: B Natriuretic Peptide: 191.2 pg/mL — ABNORMAL HIGH (ref 0.0–100.0)

## 2020-07-27 LAB — TROPONIN I (HIGH SENSITIVITY)
Troponin I (High Sensitivity): 7 ng/L (ref <18)
Troponin I (High Sensitivity): 9 ng/L (ref <18)

## 2020-07-27 LAB — MAGNESIUM: Magnesium: 1.9 mg/dL (ref 1.7–2.4)

## 2020-07-27 MED ORDER — FLUTICASONE FUROATE-VILANTEROL 100-25 MCG/INH IN AEPB
1.0000 | INHALATION_SPRAY | Freq: Every day | RESPIRATORY_TRACT | Status: DC
  Administered 2020-07-28: 1 via RESPIRATORY_TRACT
  Filled 2020-07-27: qty 28

## 2020-07-27 MED ORDER — PRAVASTATIN SODIUM 40 MG PO TABS
40.0000 mg | ORAL_TABLET | Freq: Every day | ORAL | Status: DC
  Administered 2020-07-27 – 2020-07-28 (×2): 40 mg via ORAL
  Filled 2020-07-27 (×2): qty 1

## 2020-07-27 MED ORDER — TRAMADOL HCL 50 MG PO TABS: 50.0000 mg | ORAL_TABLET | Freq: Two times a day (BID) | ORAL | Status: DC

## 2020-07-27 MED ORDER — SODIUM CHLORIDE 0.9% FLUSH
3.0000 mL | Freq: Two times a day (BID) | INTRAVENOUS | Status: DC
  Administered 2020-07-27 – 2020-07-28 (×2): 3 mL via INTRAVENOUS

## 2020-07-27 MED ORDER — ALPRAZOLAM 0.25 MG PO TABS
0.2500 mg | ORAL_TABLET | Freq: Every day | ORAL | Status: DC
  Administered 2020-07-27: 0.5 mg via ORAL
  Filled 2020-07-27: qty 2

## 2020-07-27 MED ORDER — FAMOTIDINE 20 MG PO TABS: 20.0000 mg | ORAL_TABLET | ORAL | Status: DC | PRN

## 2020-07-27 MED ORDER — LATANOPROST 0.005 % OP SOLN
1.0000 [drp] | Freq: Every evening | OPHTHALMIC | Status: DC
  Administered 2020-07-27: 1 [drp] via OPHTHALMIC
  Filled 2020-07-27: qty 2.5

## 2020-07-27 MED ORDER — SODIUM CHLORIDE 0.9% FLUSH: 3.0000 mL | INTRAVENOUS | Status: DC | PRN

## 2020-07-27 MED ORDER — ONDANSETRON HCL 4 MG/2ML IJ SOLN: 4.0000 mg | Freq: Four times a day (QID) | INTRAMUSCULAR | Status: DC | PRN

## 2020-07-27 MED ORDER — ACETAMINOPHEN 325 MG PO TABS: 650.0000 mg | ORAL_TABLET | ORAL | Status: DC | PRN

## 2020-07-27 MED ORDER — ALPRAZOLAM 0.25 MG PO TABS: 0.2500 mg | ORAL_TABLET | Freq: Two times a day (BID) | ORAL | Status: DC | PRN

## 2020-07-27 MED ORDER — SODIUM CHLORIDE 0.9 % IV SOLN: 250.0000 mL | INTRAVENOUS | Status: DC | PRN

## 2020-07-27 MED ORDER — RIVAROXABAN 20 MG PO TABS
20.0000 mg | ORAL_TABLET | Freq: Every day | ORAL | Status: DC
  Filled 2020-07-27: qty 1

## 2020-07-27 MED ORDER — ZOLPIDEM TARTRATE 5 MG PO TABS
5.0000 mg | ORAL_TABLET | Freq: Every evening | ORAL | Status: DC | PRN
Start: 2020-07-27 — End: 2020-07-28

## 2020-07-27 MED ORDER — NITROGLYCERIN 0.4 MG SL SUBL: 0.4000 mg | SUBLINGUAL_TABLET | SUBLINGUAL | Status: DC | PRN

## 2020-07-27 MED ORDER — ALBUTEROL SULFATE HFA 108 (90 BASE) MCG/ACT IN AERS: 2.0000 | INHALATION_SPRAY | Freq: Four times a day (QID) | RESPIRATORY_TRACT | Status: DC | PRN

## 2020-07-27 MED ORDER — TRAMADOL HCL 50 MG PO TABS
100.0000 mg | ORAL_TABLET | Freq: Two times a day (BID) | ORAL | Status: DC
  Administered 2020-07-27 – 2020-07-28 (×2): 100 mg via ORAL
  Filled 2020-07-27 (×2): qty 2

## 2020-07-27 MED ORDER — PROSIGHT PO TABS
1.0000 | ORAL_TABLET | Freq: Every day | ORAL | Status: DC
  Administered 2020-07-27 – 2020-07-28 (×2): 1 via ORAL
  Filled 2020-07-27 (×2): qty 1

## 2020-07-27 MED ORDER — ALBUTEROL SULFATE (2.5 MG/3ML) 0.083% IN NEBU: 2.5000 mg | INHALATION_SOLUTION | Freq: Four times a day (QID) | RESPIRATORY_TRACT | Status: DC | PRN

## 2020-07-27 MED ORDER — METOPROLOL TARTRATE 25 MG PO TABS
50.0000 mg | ORAL_TABLET | Freq: Once | ORAL | Status: AC
  Administered 2020-07-27: 50 mg via ORAL
  Filled 2020-07-27: qty 2

## 2020-07-27 MED ORDER — METOPROLOL SUCCINATE ER 100 MG PO TB24
100.0000 mg | ORAL_TABLET | Freq: Every day | ORAL | Status: DC
  Administered 2020-07-28: 100 mg via ORAL
  Filled 2020-07-27: qty 1

## 2020-07-27 NOTE — Consult Note (Addendum)
Cardiology Consultation:   Patient ID: Anna Farrell MRN: [id number]; DOB: 1927/09/23  Admit date: 07/27/2020 Date of Consult: 07/27/2020  PCP:  [name], MD   [facility] Providers Cardiologist:  Fransico Him, MD  Electrophysiologist:  Thompson Grayer, MD       Patient Profile:   Anna Farrell is a 87 y.o. female with a hx of of chronic atrial fibrillation, orthostatic hypotension, Abbott (STJ) PPM, asymptomatic PVC's and severe OSA, who is being seen 07/27/2020 for the evaluation of chest pain at the request of Dr Sherry Ruffing.  History of Present Illness:   Anna Farrell has been compliant w/ PPM checks, lower rate 50 and pacing < 10% of the time.  [name] was in her usual state of health yesterday, but she had a long day.  She had a doctor's appointment that took a long time and was tired afterwards.  She sat down in her chair, and fell asleep.  That was about 530.  She slept until 11:30 PM.  When she awoke at 11:30 PM, she was having left shoulder pain.  It reached an 8/10.  She rested in the chair all night but did not sleep.  The pain continued.  Finally at 5:30 AM, she got up to go to bed.  The pain then went across her chest and ended up in her right shoulder.  She does not feel the pain was worse with deep inspiration.  She does not feel the pain was worse with movement.  She did not really notice anything affecting pain in any way.  She felt a little short of breath, but was not nauseated, or diaphoretic with the pain.  She has not had this pain before.  She has had some dyspnea on exertion that has been chronic and has not changed recently.  She has had some lower extremity edema, not severe.  She denies orthopnea or PND.  Because of her altered sleep patterns and the pain, she did not take her evening medications.  Because she came to the emergency room, she did not take her morning medications.  She is not aware of her heart rate being elevated, but is aware that she is  in atrial fibrillation all the time.  In the ER, her chest pain has resolved.  She denies shortness of breath at rest.  She admits that she has been weak and having problems with dyspnea on exertion, but says that has been going on for months.   Past Medical History:  Diagnosis Date   Asymptomatic PVCs    Cholelithiasis    Chronic low back pain 2008   crushed vertebrae   Colitis 5/13   Extrinsic asthma, unspecified    LOV Dr Joya Gaskins 7/12   Generalized osteoarthrosis, unspecified site    History of colonic polyps    Hyperlipidemia    Hypertension    Iron deficiency anemia    Long term (current) use of anticoagulants    Orthostatic hypotension    Osteoporosis    Pacemaker 10/11/06   implanted by Dr Leonia Reeves for pauses > 4 seconds with afib   Pancreatitis    Permanent atrial fibrillation ([place])    Tachycardia-bradycardia syndrome ([place])    Vasovagal syncope     Past Surgical History:  Procedure Laterality Date   ABDOMINAL HYSTERECTOMY     APPENDECTOMY     CARDIAC CATHETERIZATION  2009   Normal coronary arteries   CHOLECYSTECTOMY  09/17/2011   Procedure: LAPAROSCOPIC CHOLECYSTECTOMY;  Surgeon: Rolm Bookbinder,  MD;  Location: [facility];  Service: General;;   COLONOSCOPY     ERCP N/A 04/07/2013   Procedure: ENDOSCOPIC RETROGRADE CHOLANGIOPANCREATOGRAPHY (ERCP);  Surgeon: Jeryl Columbia, MD;  Location: [facility];  Service: Endoscopy;  Laterality: N/A;   EUS  04/07/2013   Procedure: UPPER ENDOSCOPIC ULTRASOUND (EUS) RADIAL;  Surgeon: Jeryl Columbia, MD;  Location: [facility];  Service: Endoscopy;;   HIP ARTHROPLASTY Left 08/17/2013   Procedure: ARTHROPLASTY BIPOLAR HIP;  Surgeon: Renette Butters, MD;  Location: [place];  Service: Orthopedics;  Laterality: Left;   PACEMAKER INSERTION  10/11/2006   SJM Zephyr XL DR implanted by Dr Leonia Reeves for afib with pause > 4 seconds   PPM GENERATOR CHANGEOUT N/A 03/16/2019   Procedure: PPM GENERATOR CHANGEOUT;  Surgeon: Thompson Grayer, MD;  Location: [place] [facility];  Service: Cardiovascular;  Laterality: N/A;   VESICOVAGINAL FISTULA CLOSURE W/ TAH  1981     Home Medications:  Prior to Admission medications   Medication Sig Start Date End Date Taking? Authorizing Provider  albuterol (VENTOLIN HFA) 108 (90 Base) MCG/ACT inhaler Inhale 2 puffs into the lungs every 6 (six) hours as needed for wheezing or shortness of breath. Pro   Yes [provider]  ALPRAZolam (XANAX) 0.25 MG tablet Take 0.25-0.5 mg by mouth at bedtime.  08/05/16  Yes [provider]  amLODipine (NORVASC) 5 MG tablet Take 1 tablet (5 mg total) by mouth daily. PLEASE CONTACT OFFICE FOR ADDITIONAL REFILLS SECOND ATTEMPT 07/13/20  Yes Turner, Eber Hong, MD  BREO ELLIPTA 100-25 MCG/INH AEPB Inhale 1 puff into the lungs daily as needed (Shortness of Breath).  07/16/16  Yes [provider]  famotidine (PEPCID) 20 MG tablet Take 20 mg by mouth as needed for heartburn or indigestion.   Yes [provider]  latanoprost (XALATAN) 0.005 % ophthalmic solution Place 1 drop into the right eye every evening.   Yes [provider]  lisinopril (ZESTRIL) 40 MG tablet Take 20 mg by mouth daily. 02/26/19  Yes [provider]  metoprolol succinate (TOPROL-XL) 100 MG 24 hr tablet Take 100 mg daily by mouth. Take with or immediately following a meal.   Yes [provider]  Multiple Vitamins-Minerals (EYE VITAMINS PO) Take 1 tablet by mouth in the morning and at bedtime. preservision   Yes [provider]  nitroGLYCERIN (NITROSTAT) 0.4 MG SL tablet Place 0.4 mg under the tongue every 5 (five) minutes as needed for chest pain.   Yes [provider]  ondansetron (ZOFRAN) 8 MG tablet Take 8 mg by mouth daily as needed (Car sick).  03/04/19  Yes [provider]  pravastatin (PRAVACHOL) 40 MG tablet Take 1 tablet (40 mg total) by mouth daily. Once daily Patient taking differently: Take 40 mg by mouth daily. 07/08/13  Yes  Turner, Eber Hong, MD  traMADol (ULTRAM) 50 MG tablet Take 100 mg by mouth 2 (two) times daily.   Yes [provider]  warfarin (COUMADIN) 3 MG tablet Take 3 mg by mouth See admin instructions. Take 1.5 mg on Sunday All the other days take 3 mg   Yes [provider]  XARELTO 20 MG TABS tablet Take 20 mg by mouth daily. 07/27/20   [provider]    Inpatient Medications: Scheduled Meds:  Continuous Infusions:  PRN Meds:   Allergies:    Allergies  Allergen Reactions   Codeine Nausea And Vomiting    Passed out   Betadine [Povidone Iodine]  burning   Rocephin [Ceftriaxone Sodium In Dextrose] Itching    Social History:   Social History   Socioeconomic History   Marital status: Widowed    Spouse name: Not on file   Number of children: Not on file   Years of education: Not on file   Highest education level: Not on file  Occupational History   Occupation: Waitress    Comment: [employer]  Tobacco Use   Smoking status: Never   Smokeless tobacco: Never  Substance and Sexual Activity   Alcohol use: No   Drug use: No   Sexual activity: Never  Other Topics Concern   Not on file  Social History Narrative   Not on file   Social Determinants of Health   Financial Resource Strain: Not on file  Food Insecurity: Not on file  Transportation Needs: Not on file  Physical Activity: Not on file  Stress: Not on file  Social Connections: Not on file  Intimate Partner Violence: Not on file    Family History:    Family History  Problem Relation Age of Onset   Hypertension Mother    Pancreatitis Mother    Cancer Mother        when she was younger     ROS:  Please see the history of present illness.  All other ROS reviewed and negative.     Physical Exam/Data:   Vitals:   07/27/20 1624 07/27/20 1700 07/27/20 1745 07/27/20 1844  BP: (!) 125/108 110/82 (!) 146/80 (!) 140/94  Pulse: (!) 109 (!) 105 (!) 113 (!) 122  Resp: (!) 21 19 17  (!) 25   Temp:      TempSrc:      SpO2: 94% 94% 93% 97%   No intake or output data in the 24 hours ending 07/27/20 1855 Last 3 Weights 06/21/2019 03/16/2019 03/09/2019  Weight (lbs) 136 lb 138 lb 140 lb 6.4 oz  Weight (kg) 61.689 kg 62.596 kg 63.685 kg     There is no height or weight on file to calculate BMI.  General:  Well nourished, well developed, in no acute distress HEENT: normal Lymph: no adenopathy Neck: no JVD Endocrine:  No thryomegaly Vascular: No carotid bruits; 4/4 extremity pulses 2+ bilaterally Cardiac:  normal S1, S2; rapid and irregular rate and rhythm; no murmur  Lungs:  clear to auscultation bilaterally, no wheezing, rhonchi or rales  Abd: soft, nontender, no hepatomegaly  Ext: Trace lower extremity edema Musculoskeletal:  No deformities, BUE and BLE strength normal and equal Skin: warm and dry  Neuro:  CNs 2-12 intact, no focal abnormalities noted Psych:  Normal affect   EKG:  The EKG was personally reviewed and demonstrates:  atrial fib, HR 129, no acute ischemic changes Telemetry:  Telemetry was personally reviewed and demonstrates:  atrial fib, RVR  Relevant CV Studies:  ECHO: 05/10/2013 - Left ventricle: The cavity size was normal. Systolic    function was normal. The estimated ejection fraction was   in the range of 55% to 60%. Wall motion was normal; there   were no regional wall motion abnormalities. - Aortic valve: Moderate thickening and calcification.  - Aorta: Ascending aortic diameter: 53mm (S).  - Ascending aorta: The ascending aorta was mildly dilated.  - Mitral valve: Calcified annulus.  - Left atrium: The atrium was mildly dilated.    CARDIAC CATH: 06/22/2007 RESULTS:  1. Right heart cath data:  Right atrial pressure 7/5 with a mean of 4 mmHg.  RV pressure 27/3  with a mean of 9 mmHg.  PA pressure 26/10 with a mean of 17 mmHg.  Pulmonary capillary wedge 10/9 with a mean of 7 mmHg.  Pulmonary artery O2 saturations 64%.  Right atrial O2 saturation 97%.   Aortic O2 saturation 93%.  Cardiac output by Fick was 4.2.  Cardiac index by Fick 2.4.  LVEDP was an 17 mmHg.  LV pressure 146/21 mmHg.  Aortic pressure 146/78 mmHg.    The left coronary artery had a dual ostium to the left anterior  descending and left circumflex artery.  The left anterior descending artery is widely patent throughout its course of the apex.  It gives rise to a very large first diagonal branch, which bifurcates into 2 daughter branches, both of which are widely patent and then gives rise to a second diagonal which is widely patent.    The left circumflex is widely patent throughout its course in the AV  groove.  It gives rise to a large first obtuse marginal branch which is widely patent and then a second obtuse marginal branch which bifurcates into 2 daughter branches, both of which are widely patent.  The ongoing circumflex traverses the AV groove and is widely patent.    The right coronary is widely patent throughout its course and distally bifurcates into posterior descending artery and posterolateral artery, both of which are widely patent.    Left ventriculography shows normal LV function with hyperdynamic function, EF of 70%.  There was a PVC-induced mitral regurgitation.    ASSESSMENT:  1. Normal coronary arteries.  2. Normal right heart cath pressures.  3. Normal left ventricular function.  4. Mildly elevated left ventricular end-diastolic volume consistent      with mild diastolic dysfunction.  5. Shortness of breath consistent with primary pulmonary etiology.      Pulmonary function test did show small R-wave obstructive disease.   Laboratory Data:  High Sensitivity Troponin:   Recent Labs  Lab 07/27/20 1205 07/27/20 1352  TROPONINIHS 7 9     Chemistry Recent Labs  Lab 07/27/20 1205  NA 137  K 4.2  CL 102  CO2 25  GLUCOSE 142*  BUN 18  CREATININE 0.94  CALCIUM 9.6  GFRNONAA 58*  ANIONGAP 10    Recent Labs  Lab 07/27/20 1205  PROT 5.8*   ALBUMIN 3.1*  AST 24  ALT 10  ALKPHOS 68  BILITOT 1.0   Hematology Recent Labs  Lab 07/27/20 1205  WBC 10.7*  RBC 4.66  HGB 12.5  HCT 39.9  MCV 85.6  MCH 26.8  MCHC 31.3  RDW 16.3*  PLT 259   BNP Recent Labs  Lab 07/27/20 1205  BNP 191.2*    DDimer  Recent Labs  Lab 07/27/20 1205  DDIMER 0.28   Lab Results  Component Value Date   INR 2.7 (H) 07/27/2020   INR 1.9 (H) 03/16/2019   INR 2.2 (H) 03/15/2019   Lab Results  Component Value Date   TSH 3.673 01/31/2014   Lab Results  Component Value Date   HGBA1C 6.0 (H) 01/31/2014   No results found for: CHOL, HDL, LDLCALC, LDLDIRECT, TRIG, CHOLHDL    Radiology/Studies:  DG Chest Portable 1 View  Result Date: 07/27/2020 CLINICAL DATA:  Shortness of breath. EXAM: PORTABLE CHEST 1 VIEW COMPARISON:  Chest x-ray 06/10/2017 FINDINGS: The pacer wires are stable.  No complicating features. The cardiac silhouette, mediastinal and hilar contours are within normal limits and stable. There is stable moderate tortuosity and calcification  of the thoracic aorta. The lungs are clear. No pleural effusions or pulmonary lesions. The bony thorax is intact. IMPRESSION: No acute cardiopulmonary findings. Electronically Signed   By: Marijo Sanes M.D.   On: 07/27/2020 13:16     Assessment and Plan:   Chest pain: - ez neg MI despite prolonged pain - However, it is unclear if her oxygen levels are dropping with ambulation, we need to check - It is unclear if her chest pain might be related to elevated heart rate, she needs to be under better control - She has had no echo since 2015, she had a stress test in 2015 and a cath with no significant disease in 2009 - She is pain-free now and wants to go home.  However, I think I would feel more comfortable if we admitted her overnight, got her heart rate under better control first by restarting her home medications.  We could then check an echocardiogram. - If her oxygen levels stay good  with ambulation, she may need physical therapy -If her oxygen levels drop with ambulation, give a dose of IV Lasix and assess response -Hopefully, she will do well and will be able to be discharged tomorrow  2.  Hypertension with a history of orthostatic hypotension: - SBP has ranged from 104-146 today without having any of her medications - At home, she is on amlodipine 5 mg daily, lisinopril 20 mg daily, and Toprol-XL 100 mg daily. -Continue the metoprolol, but hold the other medications and see how she does   Risk Assessment/Risk Scores:     HEAR Score (for undifferentiated chest pain):  HEAR Score: 6    CHA2DS2-VASc Score = 5  This indicates a 7.2% annual risk of stroke. The patient's score is based upon: CHF History: No HTN History: Yes Diabetes History: No Stroke History: No Vascular Disease History: Yes Age Score: 2 Gender Score: 1    For questions or updates, please contact Bry[facility] Please consult [email or web address] for contact info under    Signed, Rosaria Ferries, PA-C  07/27/2020 6:55 PM  ---------------------------------------------------------------------------------------------   History and all data above reviewed.  Patient examined.  I agree with the findings as above.  Anna MICHALSKI is a 87 year old female who presents to the ED for left shoulder pain and radiating chest pain.  She is here with her son Gerald Stabs who provides some of the history as well.  She is known to have chronic atrial fibrillation, history of orthostatic hypotension with multiple significant episodes of orthostasis, permanent pacemaker, PVCs without symptoms of palpitations, and OSA.  She was quite fatigued after her daily activities and primary care doctor's appointment and fell asleep in her chair for many hours.  She woke up after a long sleep with left shoulder pain.  She contacted her family who also felt that this could be musculoskeletal from sleeping in the chair.  When she got up to  go to her bed around 5:30 AM several hours later she noticed that the chest pain went across her chest and into her right shoulder.  She is chest pain-free now.  She incidentally had not taken her medications for 2 days, 2 days ago she just forgot to take them and yesterday did not take them because she was here in the emergency department.  She has not received her metoprolol but notes that sometimes her atrial fibrillation rates are poorly controlled.  Constitutional: No acute distress Eyes: pupils equally round and reactive to light, sclera non-icteric, normal conjunctiva  and lids ENMT: normal dentition, moist mucous membranes Cardiovascular: Irregular rhythm, tachycardic, no murmurs. S1 and S2 normal. Radial pulses normal bilaterally. No jugular venous distention.  Respiratory: clear to auscultation bilaterally GI : normal bowel sounds, soft and nontender. No distention.   MSK: extremities warm, well perfused. No edema.  NEURO: grossly nonfocal exam, moves all extremities. PSYCH: alert and oriented x 3, normal mood and affect.   EKG shows atrial fibrillation with nonspecific ST changes and rapid ventricular response.  All available labs, radiology testing, previous records reviewed. Agree with documented assessment and plan of my colleague as stated above with the following additions or changes:  Active Problems:   Atrial fibrillation with rapid ventricular response ([place])    Plan: -Patient would very much like to go home.  Her family is in support of this decision and feel that she is back to her baseline physical state.  I have encouraged the patient to stay overnight in the hospital due to atrial fibrillation with rapid ventricular response which could precipitate hypotension.  She feels she has excellent support at home and if she runs into any difficulties she feels she can call EMS and return quickly.  I have asked that she at least receive her metoprolol in-house so that we may begin  to better rate control her prior to her discharging.  I have also asked that she walk with the nurse and if she is short of breath or unable to keep up with her daily activities at home where she lives alone, that she plan to spend the night in the hospital.  We will admit her to our service if she chooses to stay.  Her son Gerald Stabs was in agreement with the patient's decision to go home if she feels well after ambulating successfully.  I have communicated this to her nurse who will ambulate her when she has a moment and will evaluate her candidacy for discharge home.  I have also discussed this with the emergency department physician in charge of her care now.  No change to current medications I suspect her rates will be better controlled when she gets back on her metoprolol.  INR is 2.7, she is planning to transition to Xarelto at the guidance of her Coumadin clinic very soon.   Length of Stay:  LOS: 0 days   Elouise Munroe, MD [facility] 7:33 PM  07/27/2020

## 2020-07-27 NOTE — ED Provider Notes (Signed)
[facility] EMERGENCY DEPARTMENT Provider Note   CSN: [id number] Arrival date & time: 07/27/20  1137     History Chief Complaint  Patient presents with   Shortness of Breath    Anna Farrell is a 87 y.o. female.  The history is provided by the patient and medical records.  Chest Pain Pain location:  Substernal area, L chest, R chest, L lateral chest and R lateral chest Pain quality: pressure, radiating and sharp   Pain radiates to:  L shoulder and L arm Pain severity:  Severe Onset quality:  Sudden Duration:  1 hour Timing:  Constant Progression:  Improving Chronicity:  New Relieved by:  Nothing Worsened by:  Nothing Ineffective treatments:  None tried Associated symptoms: diaphoresis and fatigue   Associated symptoms: no abdominal pain, no altered mental status, no anorexia, no back pain, no cough, no fever, no headache, no heartburn, no nausea, no palpitations, no shortness of breath, no vomiting and no weakness   Risk factors: no prior DVT/PE       Past Medical History:  Diagnosis Date   Asymptomatic PVCs    Cholelithiasis    Chronic low back pain 2008   crushed vertebrae   Colitis 5/13   Extrinsic asthma, unspecified    LOV Dr Joya Gaskins 7/12   Generalized osteoarthrosis, unspecified site    History of colonic polyps    Hyperlipidemia    Hypertension    Iron deficiency anemia    Long term (current) use of anticoagulants    Orthostatic hypotension    Osteoporosis    Pacemaker 10/11/06   implanted by Dr Leonia Reeves for pauses > 4 seconds with afib   Pancreatitis    Permanent atrial fibrillation ([place])    Tachycardia-bradycardia syndrome ([place])    Vasovagal syncope     Patient Active Problem List   Diagnosis Date Noted   Vomiting 01/30/2014   UTI (lower urinary tract infection) 01/30/2014   Dehydration 01/30/2014   Blood in the stool 01/30/2014   Abnormal ECG 01/30/2014   OSA (obstructive sleep apnea) 10/12/2013   Drug-induced constipation  08/19/2013   Catheter-associated urinary tract infection ([place]) 08/19/2013   Hip fracture requiring operative repair ([place]) 08/19/2013   Hypokalemia 08/18/2013   Acute blood loss anemia 08/18/2013   Left displaced femoral neck fracture ([place]) 08/15/2013   Hyperlipidemia 08/15/2013   Abdominal pain 04/06/2013   Acute cholangitis 04/06/2013   SIRS (systemic inflammatory response syndrome) ([place]) 04/06/2013   Common bile duct dilatation 04/06/2013   Transaminitis 04/06/2013   Cardiac pacemaker in situ 04/06/2013   Pancreatitis, acute 04/06/2013   Encounter for therapeutic drug monitoring 02/25/2013   Asymptomatic PVCs    Orthostatic hypotension 12/16/2012   Extrinsic asthma, unspecified 02/27/2009   DYSLIPIDEMIA 07/09/2007   Essential hypertension 07/09/2007   Chronic atrial fibrillation ([place]) 07/09/2007   BRADYCARDIA-TACHYCARDIA SYNDROME 07/09/2007    Past Surgical History:  Procedure Laterality Date   ABDOMINAL HYSTERECTOMY     APPENDECTOMY     CARDIAC CATHETERIZATION  2009   Normal coronary arteries   CHOLECYSTECTOMY  09/17/2011   Procedure: LAPAROSCOPIC CHOLECYSTECTOMY;  Surgeon: Rolm Bookbinder, MD;  Location: [facility];  Service: General;;   COLONOSCOPY     ERCP N/A 04/07/2013   Procedure: ENDOSCOPIC RETROGRADE CHOLANGIOPANCREATOGRAPHY (ERCP);  Surgeon: Jeryl Columbia, MD;  Location: [facility];  Service: Endoscopy;  Laterality: N/A;   EUS  04/07/2013   Procedure: UPPER ENDOSCOPIC ULTRASOUND (EUS) RADIAL;  Surgeon: Jeryl Columbia, MD;  Location: [facility];  Service: Endoscopy;;   HIP ARTHROPLASTY Left 08/17/2013   Procedure: ARTHROPLASTY BIPOLAR HIP;  Surgeon: Renette Butters, MD;  Location: [facility];  Service: Orthopedics;  Laterality: Left;   PACEMAKER INSERTION  10/11/2006   SJM Zephyr XL DR implanted by Dr Leonia Reeves for afib with pause > 4 seconds   PPM GENERATOR CHANGEOUT N/A 03/16/2019   Procedure: PPM GENERATOR CHANGEOUT;  Surgeon: Thompson Grayer, MD;  Location: [facility];   Service: Cardiovascular;  Laterality: N/A;   VESICOVAGINAL FISTULA CLOSURE W/ TAH  1981     OB History   No obstetric history on file.     Family History  Problem Relation Age of Onset   Hypertension Mother    Pancreatitis Mother    Cancer Mother        when she was younger    Social History   Tobacco Use   Smoking status: Never   Smokeless tobacco: Never  Substance Use Topics   Alcohol use: No   Drug use: No    Home Medications Prior to Admission medications   Medication Sig Start Date End Date Taking? Authorizing Provider  albuterol (VENTOLIN HFA) 108 (90 Base) MCG/ACT inhaler Inhale 2 puffs into the lungs every 6 (six) hours as needed for wheezing or shortness of breath. Pro    [provider]  ALPRAZolam (XANAX) 0.25 MG tablet Take 0.25-0.5 mg by mouth at bedtime.  08/05/16   [provider]  amLODipine (NORVASC) 5 MG tablet Take 1 tablet (5 mg total) by mouth daily. PLEASE CONTACT OFFICE FOR ADDITIONAL REFILLS SECOND ATTEMPT 07/13/20   Turner, Eber Hong, MD  BREO ELLIPTA 100-25 MCG/INH AEPB Inhale 1 puff into the lungs daily as needed (Shortness of Breath).  07/16/16   [provider]  latanoprost (XALATAN) 0.005 % ophthalmic solution Place 1 drop into the right eye every evening.    [provider]  lisinopril (ZESTRIL) 40 MG tablet Take 40 mg by mouth daily.  02/26/19   [provider]  metoprolol succinate (TOPROL-XL) 100 MG 24 hr tablet Take 100 mg daily by mouth. Take with or immediately following a meal.    [provider]  Multiple Vitamins-Minerals (EYE VITAMINS PO) Take 1 tablet by mouth in the morning and at bedtime. preservision    [provider]  nitroGLYCERIN (NITROSTAT) 0.4 MG SL tablet Place 0.4 mg under the tongue every 5 (five) minutes as needed for chest pain.    [provider]  ondansetron (ZOFRAN) 8 MG tablet Take 8 mg by mouth daily as needed (Car sick).  03/04/19   [provider]  pravastatin (PRAVACHOL) 40 MG tablet Take 1 tablet (40 mg total) by mouth daily. Once daily Patient taking differently: Take 40 mg by mouth daily.  07/08/13   Sueanne Margarita, MD  traMADol (ULTRAM) 50 MG tablet Take 100 mg by mouth 2 (two) times daily.    [provider]  warfarin (COUMADIN) 3 MG tablet Take 3 mg by mouth See admin instructions. Take 1.5 mg on Sunday All the other days take 3 mg    [provider]    Allergies    Codeine, Betadine [povidone iodine], and Rocephin [ceftriaxone sodium in dextrose]  Review of Systems   Review of Systems  Constitutional:  Positive for diaphoresis and fatigue. Negative for chills and fever.  HENT:  Negative for congestion.   Respiratory:  Positive for chest tightness. Negative for cough and shortness of breath.   Cardiovascular:  Positive for chest pain. Negative for palpitations.  Gastrointestinal:  Negative for abdominal pain, anorexia, constipation, diarrhea, heartburn, nausea and vomiting.  Genitourinary:  Negative for dysuria, flank pain and frequency.  Musculoskeletal:  Negative for back pain and neck pain.  Skin:  Negative for rash and wound.  Neurological:  Positive for light-headedness. Negative for weakness and headaches.  Psychiatric/Behavioral:  Negative for agitation and confusion.   All other systems reviewed and are negative.  Physical Exam Updated Vital Signs BP 118/76   Pulse 94   Temp 99.1 F (37.3 C) (Oral)   Resp 20   SpO2 94%   Physical Exam Vitals and nursing note reviewed.  Constitutional:      General: She is not in acute distress.    Appearance: She is well-developed. She is not ill-appearing, toxic-appearing or diaphoretic.  HENT:     Head: Normocephalic and atraumatic.  Eyes:     Conjunctiva/sclera: Conjunctivae normal.     Pupils: Pupils are equal, round, and reactive to light.  Cardiovascular:     Rate and Rhythm: Regular rhythm. Tachycardia present.     Heart  sounds: No murmur heard. Pulmonary:     Effort: Pulmonary effort is normal. Tachypnea present. No respiratory distress.     Breath sounds: Normal breath sounds. No decreased breath sounds, wheezing, rhonchi or rales.  Chest:     Chest wall: No tenderness.  Abdominal:     Palpations: Abdomen is soft.     Tenderness: There is no abdominal tenderness.  Musculoskeletal:     Cervical back: Neck supple.     Right lower leg: No tenderness. No edema.     Left lower leg: No tenderness. No edema.  Skin:    General: Skin is warm and dry.     Capillary Refill: Capillary refill takes less than 2 seconds.     Coloration: Skin is pale.  Neurological:     General: No focal deficit present.     Mental Status: She is alert.  Psychiatric:        Mood and Affect: Mood normal.    ED Results / Procedures / Treatments   Labs (all labs ordered are listed, but only abnormal results are displayed) Labs Reviewed  CBC WITH DIFFERENTIAL/PLATELET - Abnormal; Notable for the following components:      Result Value   WBC 10.7 (*)    RDW 16.3 (*)    Neutro Abs 8.3 (*)    Monocytes Absolute 1.4 (*)    All other components within normal limits  COMPREHENSIVE METABOLIC PANEL - Abnormal; Notable for the following components:   Glucose, Bld 142 (*)    Total Protein 5.8 (*)    Albumin 3.1 (*)    GFR, Estimated 58 (*)    All other components within normal limits  LACTIC ACID, PLASMA - Abnormal; Notable for the following components:   Lactic Acid, Venous 2.1 (*)    All other components within normal limits  BRAIN NATRIURETIC PEPTIDE - Abnormal; Notable for the following components:   B Natriuretic Peptide 191.2 (*)    All other components within normal limits  PROTIME-INR - Abnormal; Notable for the following components:   Prothrombin Time 28.8 (*)    INR 2.7 (*)    All other components within normal limits  RESP PANEL BY RT-PCR (FLU A&B, COVID) ARPGX2  LIPASE, BLOOD  LACTIC ACID, PLASMA  MAGNESIUM   D-DIMER, QUANTITATIVE  TROPONIN I (HIGH SENSITIVITY)  TROPONIN I (HIGH SENSITIVITY)    EKG  EKG Interpretation  Date/Time:  Thursday July 27 2020 11:47:13 EDT Ventricular Rate:  129 PR Interval:    QRS Duration: 88 QT Interval:  283 QTC Calculation: 415 R Axis:   -3 Text Interpretation: Atrial fibrillation Multiple ventricular premature complexes Low voltage, precordial leads Consider anterior infarct Repolarization abnormality, prob rate related When compared to prior, similar afib with RVR.  with PVCs. No STEMI Confirmed by Antony Blackbird [phone]) on 07/27/2020 11:57:46 AM  Radiology DG Chest Portable 1 View  Result Date: 07/27/2020 CLINICAL DATA:  Shortness of breath. EXAM: PORTABLE CHEST 1 VIEW COMPARISON:  Chest x-ray 06/10/2017 FINDINGS: The pacer wires are stable.  No complicating features. The cardiac silhouette, mediastinal and hilar contours are within normal limits and stable. There is stable moderate tortuosity and calcification of the thoracic aorta. The lungs are clear. No pleural effusions or pulmonary lesions. The bony thorax is intact. IMPRESSION: No acute cardiopulmonary findings. Electronically Signed   By: Marijo Sanes M.D.   On: 07/27/2020 13:16    Procedures Procedures   Medications Ordered in ED Medications - No data to display  ED Course  I have reviewed the triage vital signs and the nursing notes.  Pertinent labs & imaging results that were available during my care of the patient were reviewed by me and considered in my medical decision making (see chart for details).    MDM Rules/Calculators/A&P                           Anna Farrell is a 87 y.o. female with a past medical history significant for hypertension, dyslipidemia, previous bradycardia/tachycardia syndrome with pacemaker, chronic atrial fibrillation on Coumadin therapy but not on it for the last 2 days, previous pancreatitis, and anemia who presents with chest pain.  Patient reports that she  woke up this morning with pain in her left shoulder which started going down her left arm but also went across her chest.  She reports it is sharp and is both exertional and pleuritic.  She denies palpitations, nausea, or vomiting.  She does report she got sweaty with the pain slightly.  She reports no syncope but was feeling lightheaded with it.  She denies any recent constipation, diarrhea, or urinary changes.  No recent leg pain or leg swelling.  She reports she saw her doctor yesterday which is why she did not take her Coumadin yesterday or today.  She reports they are trying to switch her to a different anticoagulation medication due to difficulties with regulating her INR.  She denies any history of DVT or PE but does report a sudden onset sharp pleuritic and exertional pain across her chest.  Patient's oxygen saturations were in the 90s but during my initial discussion did dip into the 80s at times.  She is persistently tachycardic.  On exam, lungs are clear and chest is nontender.  Abdomen is nontender.  Normal sensation and strength in extremities.  Good pulses.  Legs are nontender nonedematous.  Patient is now resting with only minimal chest discomfort.  Her mucous membranes are pale and slightly dry appearing  Clinically I am concerned about a cardiac etiology of her symptoms given it going down her left arm and across her chest with some diaphoresis and is both exertional and pleuritic.  With her fluctuating INR and no Coumadin for 2 days although she is being treated for A. fib with this, a DVT or PE is also considered.  We will  get a D-dimer.  We will get a likely preadmission COVID swab and get other labs.  We we will also make sure she is not symptomatically anemic causing this.  Anticipate reassessment for work-up.  We will interrogate her pacemaker as well.  Heart score calculated as a 6.  EKG showed no STEMI.  3:51 PM Work-up continue to return.  Delta troponin was negative.  D-dimer  was negative.  BNP was slightly elevated.  Oxygen saturations remained around 92 to 94% on room air.  She is intermittently tachycardic.  She has chronic A. fib which is why she is on the Coumadin.  INR was 2.7.  Chest x-ray did not show any acute or critical abnormalities.  Lactic acid slightly elevated but improving.  Due to the patient's concerning chest discomfort with diaphoresis going across her chest and down her left arm with lightheadedness, shortness of breath, tachycardia, and with exertion, will call cardiology for recommendations.    Final Clinical Impression(s) / ED Diagnoses Final diagnoses:  Shortness of breath  Chest pain, unspecified type  Exertional shortness of breath  Diaphoresis   Clinical Impression: 1. Shortness of breath   2. Chest pain, unspecified type   3. Exertional shortness of breath   4. Diaphoresis     Disposition: Awaiting cardiology recommendations  This note was prepared with assistance of Dragon voice recognition software. Occasional wrong-word or sound-a-like substitutions may have occurred due to the inherent limitations of voice recognition software.     Uriel Horkey, Gwenyth Allegra, MD 07/27/20 [phone]

## 2020-07-27 NOTE — H&P (Addendum)
   See Consult note.  Rosaria Ferries, PA-C 07/27/2020 7:01 PM  H+P documentation in consult note from earlier today.  Elouise Munroe, MD 7:38 PM 07/27/20

## 2020-07-27 NOTE — ED Triage Notes (Signed)
Pt arrived via [facility] from home. Per EMS, approx 530 am pt began to have left shoulder pain that progressively radiated across the chest as time went by. Pain increases on respiration and pt c/o SOB, more so on exertion. EMS advised pt had SpO2 maintaining in the low 90's while in their care. Pt has hx of afib and has been off her warfarin x2 days d/t having annual appt with PCP. 324 mg aspirin admin by EMS.

## 2020-07-27 NOTE — ED Notes (Signed)
Attempted to call report, gave call back number and was advised RN would be calling back shortly.

## 2020-07-28 ENCOUNTER — Observation Stay (HOSPITAL_BASED_OUTPATIENT_CLINIC_OR_DEPARTMENT_OTHER): Payer: Medicare Other

## 2020-07-28 ENCOUNTER — Telehealth: Payer: Self-pay | Admitting: Cardiology

## 2020-07-28 ENCOUNTER — Other Ambulatory Visit (HOSPITAL_COMMUNITY): Payer: Self-pay

## 2020-07-28 DIAGNOSIS — I4821 Permanent atrial fibrillation: Secondary | ICD-10-CM | POA: Diagnosis not present

## 2020-07-28 DIAGNOSIS — Z950 Presence of cardiac pacemaker: Secondary | ICD-10-CM | POA: Diagnosis not present

## 2020-07-28 DIAGNOSIS — Z7901 Long term (current) use of anticoagulants: Secondary | ICD-10-CM | POA: Diagnosis not present

## 2020-07-28 DIAGNOSIS — R079 Chest pain, unspecified: Secondary | ICD-10-CM | POA: Diagnosis not present

## 2020-07-28 DIAGNOSIS — R072 Precordial pain: Secondary | ICD-10-CM | POA: Diagnosis not present

## 2020-07-28 DIAGNOSIS — Z79899 Other long term (current) drug therapy: Secondary | ICD-10-CM | POA: Diagnosis not present

## 2020-07-28 DIAGNOSIS — I4891 Unspecified atrial fibrillation: Secondary | ICD-10-CM

## 2020-07-28 DIAGNOSIS — I10 Essential (primary) hypertension: Secondary | ICD-10-CM | POA: Diagnosis not present

## 2020-07-28 DIAGNOSIS — R0602 Shortness of breath: Secondary | ICD-10-CM | POA: Diagnosis not present

## 2020-07-28 DIAGNOSIS — Z20822 Contact with and (suspected) exposure to covid-19: Secondary | ICD-10-CM | POA: Diagnosis not present

## 2020-07-28 LAB — TSH: TSH: 2.779 u[IU]/mL (ref 0.350–4.500)

## 2020-07-28 LAB — COMPREHENSIVE METABOLIC PANEL
ALT: 13 U/L (ref 0–44)
AST: 20 U/L (ref 15–41)
Albumin: 2.8 g/dL — ABNORMAL LOW (ref 3.5–5.0)
Alkaline Phosphatase: 56 U/L (ref 38–126)
Anion gap: 7 (ref 5–15)
BUN: 11 mg/dL (ref 8–23)
CO2: 30 mmol/L (ref 22–32)
Calcium: 9 mg/dL (ref 8.9–10.3)
Chloride: 100 mmol/L (ref 98–111)
Creatinine, Ser: 0.81 mg/dL (ref 0.44–1.00)
GFR, Estimated: >60 mL/min (ref >60)
Glucose, Bld: 122 mg/dL — ABNORMAL HIGH (ref 70–99)
Potassium: 3.7 mmol/L (ref 3.5–5.1)
Sodium: 137 mmol/L (ref 135–145)
Total Bilirubin: 1.1 mg/dL (ref 0.3–1.2)
Total Protein: 5.4 g/dL — ABNORMAL LOW (ref 6.5–8.1)

## 2020-07-28 LAB — ECHOCARDIOGRAM COMPLETE
AR max vel: 1.67 cm2
AV Area VTI: 1.08 cm2
AV Area mean vel: 1.64 cm2
AV Mean grad: 8 mmHg
AV Peak grad: 14.9 mmHg
Ao pk vel: 1.93 m/s
Area-P 1/2: 3.21 cm2
Calc EF: 69.2 %
Height: 62 in
S' Lateral: 3.1 cm
Single Plane A2C EF: 51.4 %
Single Plane A4C EF: 77.2 %
Weight: 2144.63 oz

## 2020-07-28 LAB — PROTIME-INR
INR: 2.5 — ABNORMAL HIGH (ref 0.8–1.2)
Prothrombin Time: 27.3 seconds — ABNORMAL HIGH (ref 11.4–15.2)

## 2020-07-28 MED ORDER — RIVAROXABAN 15 MG PO TABS: 15.0000 mg | ORAL_TABLET | Freq: Every day | ORAL | 11 refills | Status: DC

## 2020-07-28 MED ORDER — RIVAROXABAN 15 MG PO TABS
15.0000 mg | ORAL_TABLET | Freq: Every day | ORAL | 0 refills | Status: DC
  Filled 2020-07-28: qty 30, 30d supply, fill #0

## 2020-07-28 MED ORDER — RIVAROXABAN 15 MG PO TABS
15.0000 mg | ORAL_TABLET | Freq: Every day | ORAL | 2 refills | Status: DC
  Filled 2020-07-28: qty 60, 60d supply, fill #0

## 2020-07-28 MED ORDER — RIVAROXABAN 15 MG PO TABS
15.0000 mg | ORAL_TABLET | Freq: Every day | ORAL | Status: DC
  Administered 2020-07-28: 15 mg via ORAL
  Filled 2020-07-28: qty 1

## 2020-07-28 NOTE — Progress Notes (Signed)
Initial Nutrition Assessment  DOCUMENTATION CODES:   Non-severe (moderate) malnutrition in context of social or environmental circumstances  INTERVENTION:   Ensure Enlive po BID, each supplement provides 350 kcal and 20 grams of protein. Continue Prosight MVI daily.  NUTRITION DIAGNOSIS:   Moderate Malnutrition related to social / environmental circumstances (advanced age with reduced vision and chronically decreased appetite) as evidenced by mild muscle depletion, mild fat depletion, moderate muscle depletion.  GOAL:   Patient will meet greater than or equal to 90% of their needs  MONITOR:   PO intake, Supplement acceptance  REASON FOR ASSESSMENT:   Malnutrition Screening Tool    ASSESSMENT:   87 yo female admitted with chest pain. PMH includes chronic A fib, orthostatic hypotension, PPM, HTN, HLD, iron deficiency anemia, severe OSA, asymptomatic PVCs.  Patient reports 2-3 lb weight loss over the past few days. She did not have anything to eat for ~2 days. She was recently deemed legally blind. She typically does not eat breakfast; lunch meal is brought in by family member; dinner is left by family member that she can microwave. Patient states she is not a big eater. She has had Ensure supplements in the past but she hasn't had them in a while. She states that her doctor has encouraged her to maintain weight and maximize calorie intake. Patient hopeful for discharge home today and plans to purchase a case of Ensure supplements to start drinking at home.   She meets criteria for moderate malnutrition with mild depletion of muscle and subcutaneous fat mass.   Labs reviewed. Medications reviewed and include Prosight MVI.   NUTRITION - FOCUSED PHYSICAL EXAM:  Flowsheet Row Most Recent Value  Orbital Region Mild depletion  Upper Arm Region Mild depletion  Thoracic and Lumbar Region Mild depletion  Buccal Region No depletion  Temple Region Moderate depletion  Clavicle Bone  Region Mild depletion  Clavicle and Acromion Bone Region Mild depletion  Scapular Bone Region Mild depletion  Dorsal Hand Moderate depletion  Patellar Region Mild depletion  Anterior Thigh Region Mild depletion  Posterior Calf Region Mild depletion  Edema (RD Assessment) Mild  Hair Reviewed  Eyes Reviewed  Mouth Reviewed  Skin Reviewed  Nails Reviewed       Diet Order:   Diet Order             Diet Heart Room service appropriate? Yes; Fluid consistency: Thin  Diet effective now                   EDUCATION NEEDS:   Education needs have been addressed  Skin:  Skin Assessment: Reviewed RN Assessment  Last BM:  no BM documented  Height:   Ht Readings from Last 1 Encounters:  07/27/20 '5\' 2"'$  (1.575 m)    Weight:   Wt Readings from Last 1 Encounters:  07/28/20 60.8 kg    BMI:  Body mass index is 24.52 kg/m.  Estimated Nutritional Needs:   Kcal:  1400-1600  Protein:  70-80 gm  Fluid:  1.4-1.6 L    Lucas Mallow, RD, LDN, CNSC Please refer to Amion for contact information.

## 2020-07-28 NOTE — Progress Notes (Signed)
Pt discharged per MD order. Instructions reviewed with pt and daughter to satisfaction. IV and tele removed. Daughter to escort pt home in personal vehicle.

## 2020-07-28 NOTE — Plan of Care (Signed)
  Problem: Education: Goal: Knowledge of General Education information will improve Description Including pain rating scale, medication(s)/side effects and non-pharmacologic comfort measures Outcome: Progressing   

## 2020-07-28 NOTE — Progress Notes (Addendum)
ANTICOAGULATION CONSULT NOTE - Initial Consult  Pharmacy Consult for Xarelto (rivaroxaban) Indication: atrial fibrillation  Allergies  Allergen Reactions   Codeine Nausea And Vomiting    Passed out   Betadine [Povidone Iodine]     burning   Rocephin [Ceftriaxone Sodium In Dextrose] Itching    Patient Measurements: Height: '5\' 2"'$  (157.5 cm) Weight: 60.8 kg (134 lb 0.6 oz) IBW/kg (Calculated) : 50.1    Vital Signs: Temp: 97.7 F (36.5 C) (07/22 0732) Temp Source: Oral (07/22 0732) BP: 108/70 (07/22 0732) Pulse Rate: 69 (07/22 0732)  Labs: Recent Labs    07/27/20 1205 07/27/20 1352 07/28/20 0155  HGB 12.5  --   --   HCT 39.9  --   --   PLT 259  --   --   LABPROT 28.8*  --  27.3*  INR 2.7*  --  2.5*  CREATININE 0.94  --  0.81  TROPONINIHS 7 9  --     Estimated Creatinine Clearance: 39.6 mL/min (by C-G formula based on SCr of 0.81 mg/dL).   Medical History: Past Medical History:  Diagnosis Date   Asymptomatic PVCs    Cholelithiasis    Chronic low back pain 2008   crushed vertebrae   Colitis 5/13   Extrinsic asthma, unspecified    LOV Dr Joya Gaskins 7/12   Generalized osteoarthrosis, unspecified site    History of colonic polyps    Hyperlipidemia    Hypertension    Iron deficiency anemia    Long term (current) use of anticoagulants    Orthostatic hypotension    Osteoporosis    Pacemaker 10/11/06   implanted by Dr Leonia Reeves for pauses > 4 seconds with afib   Pancreatitis    Permanent atrial fibrillation ([place])    Tachycardia-bradycardia syndrome ([place])    Vasovagal syncope     Medications:  Medications Prior to Admission  Medication Sig Dispense Refill Last Dose   albuterol (VENTOLIN HFA) 108 (90 Base) MCG/ACT inhaler Inhale 2 puffs into the lungs every 6 (six) hours as needed for wheezing or shortness of breath. Pro   unk   ALPRAZolam (XANAX) 0.25 MG tablet Take 0.25-0.5 mg by mouth at bedtime.   0 07/26/2020   amLODipine (NORVASC) 5 MG tablet Take 1 tablet  (5 mg total) by mouth daily. PLEASE CONTACT OFFICE FOR ADDITIONAL REFILLS SECOND ATTEMPT 15 tablet 0 Past Week   BREO ELLIPTA 100-25 MCG/INH AEPB Inhale 1 puff into the lungs daily as needed (Shortness of Breath).   1 Past Month   famotidine (PEPCID) 20 MG tablet Take 20 mg by mouth as needed for heartburn or indigestion.   Past Week   latanoprost (XALATAN) 0.005 % ophthalmic solution Place 1 drop into the right eye every evening.   Past Week   lisinopril (ZESTRIL) 40 MG tablet Take 20 mg by mouth daily.   07/27/2020   metoprolol succinate (TOPROL-XL) 100 MG 24 hr tablet Take 100 mg daily by mouth. Take with or immediately following a meal.   Past Week   Multiple Vitamins-Minerals (EYE VITAMINS PO) Take 1 tablet by mouth in the morning and at bedtime. preservision   Past Week   nitroGLYCERIN (NITROSTAT) 0.4 MG SL tablet Place 0.4 mg under the tongue every 5 (five) minutes as needed for chest pain.   unk   ondansetron (ZOFRAN) 8 MG tablet Take 8 mg by mouth daily as needed (Car sick).    07/27/2020   pravastatin (PRAVACHOL) 40 MG tablet Take 1 tablet (40 mg  total) by mouth daily. Once daily (Patient taking differently: Take 40 mg by mouth daily.) 90 tablet 1 Past Week   traMADol (ULTRAM) 50 MG tablet Take 100 mg by mouth 2 (two) times daily.   07/27/2020   warfarin (COUMADIN) 3 MG tablet Take 3 mg by mouth See admin instructions. Take 1.5 mg on Sunday All the other days take 3 mg   Past Week   XARELTO 20 MG TABS tablet Take 20 mg by mouth daily.   hasn't started    Assessment: Anna Farrell is an 87 YOF who presents with chest pain which resolved in the ED. She has a PMH significant for afib anticoagulated with warfarin, has a permanent pacemaker. Per patient's daughter, she was in the process of transitioning from warfarin to Xarelto and had been instructed to hold warfarin x 2 days secondary to an INR of 3.17 and start Xarelto on 7/23, when INR < 3.   INR on admission 2.7, and today INR is 2.5. Okay to  start Xarelto. Calculated patient's CrCl to be 44 ml/min (Scr 0.81, TBW 60.8kg, age 87) and pt will require lower 15 mg daily dose. Pt's daughter had already picked up Xarelto 20 mg daily and instructed to not give this dose.    Plan:  Start Xarelto 15 mg daily Stop warfarin Will send new rx for Xarelto 15 mg daily to [facility] for free 30 day supply with coupon.  Will send new rx for Xarelto 15 mg daily to patient's preferred pharmacy Educated patient on new start [place] will sign-off, reconsult as needed  Thank you for involving pharmacy in this patient's care.  Anna Farrell, PharmD PGY1 Ambulatory Care Pharmacy Resident 07/28/2020 11:18 AM  **Pharmacist phone directory can be found on [email or web address] listed under [place]**

## 2020-07-28 NOTE — Telephone Encounter (Signed)
Per Sharee Pimple, set up [facility] visit on 08/16/20 at 1:45 PM with Cecilie Kicks.

## 2020-07-28 NOTE — Discharge Summary (Signed)
Discharge Summary    Patient ID: Anna Farrell MRN: [id number]; DOB: 1927/09/16  Admit date: 07/27/2020 Discharge date: 07/28/2020  PCP:  [name], MD   [facility] Providers Cardiologist:  Fransico Him, MD  Electrophysiologist:  Thompson Grayer, MD  1}   Discharge Diagnoses    Active Problems:   Chest pain   Hyperlipidemia   Atrial fibrillation with rapid ventricular response [facility])  Diagnostic Studies/Procedures    Echocardiogram 07/28/20:   1. Aortic valve not well visualized but appears calcified; no significant  AS by doppler.   2. Left ventricular ejection fraction, by estimation, is 70 to 75%. The  left ventricle has hyperdynamic function. The left ventricle has no  regional wall motion abnormalities. There is mild left ventricular  hypertrophy of the basal-septal segment. Left  ventricular diastolic parameters are indeterminate.   3. Right ventricular systolic function is normal. The right ventricular  size is normal. There is normal pulmonary artery systolic pressure.   4. The mitral valve is normal in structure. Trivial mitral valve  regurgitation. No evidence of mitral stenosis. Moderate mitral annular  calcification.   5. The aortic valve has an indeterminant number of cusps. Aortic valve  regurgitation is not visualized. No aortic stenosis is present.   6. Aortic dilatation noted. There is mild dilatation of the ascending  aorta, measuring 39 mm.   7. The inferior vena cava is normal in size with greater than 50%  respiratory variability, suggesting right atrial pressure of 3 mmHg.  _____________   History of Present Illness     Anna Farrell is a 87 y.o. female with a hx of of chronic atrial fibrillation, orthostatic hypotension, Abbott (STJ) PPM, asymptomatic PVC's and severe OSA, who is being seen 07/27/2020 for the evaluation of chest pain at the request of Dr Sherry Ruffing.  She was in her usual state of health prior to hospital presentation. She  reports that she took a nap and when she woke she began having shoulder pain that persisted. She felt a little short of breath, but was not nauseated, or diaphoretic with the pain. Given her symptoms she presented to the ED for further evaluation.   Hospital Course    In the ER, her chest pain has resolved.  She remained in AF however her rates were relatively uncontrolled. She was placed back on her metoprolol with improvement. HsT 7>>9. Echocardiogram showed hyperdynamic LV function and no regional wall motion abnormalities or concerning findings for etiology of chest pain.   Medical problems as follow:  Chest pain: -Presented after acute onset of left shoulder pain with radiation to left chest with associated SOB -HsT 7>>9 -No recurrence -HRs improved this AM -Echocardiogram with EF at 70-75%, no RWMA, LVH, no valve disease and mild dilation of the ascending aorta.    HTN: -Soft, 108/70>>116/78>>94/83 -Continue current regimen    Chronic atrial fibrillation: -Rates better controlled today in the 70-80 range -Previously on Coumadin with plans to transition to Xarelto with PCP however wishes to make that transition prior to discharge. Our pharmD team has educated the patient on Xarelto. We will start Xarelto at '15mg'$  BID due to age and renal function. Prescription sent to [facility] pharm -Continue on Metoprolol   Consultants: None    The patient was seen and examined by Dr. Margaretann Loveless who feels that she is stable and ready for discharge today, 07/28/20.  Did the patient have an acute coronary syndrome (MI, NSTEMI, STEMI, etc) this admission?:  No  Did the patient have a percutaneous coronary intervention (stent / angioplasty)?:  No.   _____________  Discharge Vitals Blood pressure 97/75, pulse 84, temperature 98 F (36.7 C), temperature source Oral, resp. rate 18, height '5\' 2"'$  (1.575 m), weight 60.8 kg, SpO2 96 %.  Filed Weights   07/27/20 2116 07/28/20 0504   Weight: 61.4 kg 60.8 kg    Labs & Radiologic Studies    CBC Recent Labs    07/27/20 1205  WBC 10.7*  NEUTROABS 8.3*  HGB 12.5  HCT 39.9  MCV 85.6  PLT [id number]   Basic Metabolic Panel Recent Labs    07/27/20 1205 07/28/20 0155  NA 137 137  K 4.2 3.7  CL 102 100  CO2 25 30  GLUCOSE 142* 122*  BUN 18 11  CREATININE 0.94 0.81  CALCIUM 9.6 9.0  MG 1.9  --    Liver Function Tests Recent Labs    07/27/20 1205 07/28/20 0155  AST 24 20  ALT 10 13  ALKPHOS 68 56  BILITOT 1.0 1.1  PROT 5.8* 5.4*  ALBUMIN 3.1* 2.8*   Recent Labs    07/27/20 1205  LIPASE 23   High Sensitivity Troponin:   Recent Labs  Lab 07/27/20 1205 07/27/20 1352  TROPONINIHS 7 9    BNP Invalid input(s): POCBNP D-Dimer Recent Labs    07/27/20 1205  DDIMER 0.28   Hemoglobin A1C No results for input(s): HGBA1C in the last 72 hours. Fasting Lipid Panel No results for input(s): CHOL, HDL, LDLCALC, TRIG, CHOLHDL, LDLDIRECT in the last 72 hours. Thyroid Function Tests Recent Labs    07/28/20 0155  TSH 2.779   _____________  DG Chest Portable 1 View  Result Date: 07/27/2020 CLINICAL DATA:  Shortness of breath. EXAM: PORTABLE CHEST 1 VIEW COMPARISON:  Chest x-ray 06/10/2017 FINDINGS: The pacer wires are stable.  No complicating features. The cardiac silhouette, mediastinal and hilar contours are within normal limits and stable. There is stable moderate tortuosity and calcification of the thoracic aorta. The lungs are clear. No pleural effusions or pulmonary lesions. The bony thorax is intact. IMPRESSION: No acute cardiopulmonary findings. Electronically Signed   By: Marijo Sanes M.D.   On: 07/27/2020 13:16   ECHOCARDIOGRAM COMPLETE  Result Date: 07/28/2020    ECHOCARDIOGRAM REPORT   Patient Name:   Anna Farrell Date of Exam: 07/28/2020 Medical Rec #:  [id number]     Height:       62.0 in Accession #:    [id number]    Weight:       134.0 lb Date of Birth:  [birth year]     BSA:          1.613 m  Patient Age:    87 years      BP:           108/70 mmHg Patient Gender: F             HR:           113 bpm. Exam Location:  Inpatient Procedure: 2D Echo, Cardiac Doppler and Color Doppler Indications:    Chest pain  History:        Patient has prior history of Echocardiogram examinations, most                 recent 05/10/2013. Pacemaker, Arrythmias:Atrial Fibrillation,                 Tachycardia and Bradycardia, Signs/Symptoms:Syncope; Risk  Factors:Hypertension and Dyslipidemia. 03/16/2019 pacer.  Sonographer:    Luisa Hart RDCS Referring Phys: [address] [place]  1. Aortic valve not well visualized but appears calcified; no significant AS by doppler.  2. Left ventricular ejection fraction, by estimation, is 70 to 75%. The left ventricle has hyperdynamic function. The left ventricle has no regional wall motion abnormalities. There is mild left ventricular hypertrophy of the basal-septal segment. Left ventricular diastolic parameters are indeterminate.  3. Right ventricular systolic function is normal. The right ventricular size is normal. There is normal pulmonary artery systolic pressure.  4. The mitral valve is normal in structure. Trivial mitral valve regurgitation. No evidence of mitral stenosis. Moderate mitral annular calcification.  5. The aortic valve has an indeterminant number of cusps. Aortic valve regurgitation is not visualized. No aortic stenosis is present.  6. Aortic dilatation noted. There is mild dilatation of the ascending aorta, measuring 39 mm.  7. The inferior vena cava is normal in size with greater than 50% respiratory variability, suggesting right atrial pressure of 3 mmHg. FINDINGS  Left Ventricle: Left ventricular ejection fraction, by estimation, is 70 to 75%. The left ventricle has hyperdynamic function. The left ventricle has no regional wall motion abnormalities. The left ventricular internal cavity size was normal in size. There is mild left ventricular  hypertrophy of the basal-septal segment. Left ventricular diastolic parameters are indeterminate. Right Ventricle: The right ventricular size is normal.Right ventricular systolic function is normal. There is normal pulmonary artery systolic pressure. The tricuspid regurgitant velocity is 2.37 m/s, and with an assumed right atrial pressure of 3 mmHg, the estimated right ventricular systolic pressure is [id number] mmHg. Left Atrium: Left atrial size was normal in size. Right Atrium: Right atrial size was normal in size. Pericardium: There is no evidence of pericardial effusion. Mitral Valve: The mitral valve is normal in structure. Moderate mitral annular calcification. Trivial mitral valve regurgitation. No evidence of mitral valve stenosis. Tricuspid Valve: The tricuspid valve is normal in structure. Tricuspid valve regurgitation is trivial. No evidence of tricuspid stenosis. Aortic Valve: The aortic valve has an indeterminant number of cusps. Aortic valve regurgitation is not visualized. No aortic stenosis is present. Aortic valve mean gradient measures 8.0 mmHg. Aortic valve peak gradient measures 14.9 mmHg. Pulmonic Valve: The pulmonic valve was not well visualized. Pulmonic valve regurgitation is not visualized. No evidence of pulmonic stenosis. Aorta: Aortic dilatation noted. There is mild dilatation of the ascending aorta, measuring 39 mm. Venous: The inferior vena cava is normal in size with greater than 50% respiratory variability, suggesting right atrial pressure of 3 mmHg. IAS/Shunts: No atrial level shunt detected by color flow Doppler. Additional Comments: Aortic valve not well visualized but appears calcified; no significant AS by doppler. A device lead is visualized.  LEFT VENTRICLE PLAX 2D LVIDd:         3.60 cm LVIDs:         3.10 cm LV PW:         1.10 cm LV IVS:        1.30 cm LVOT diam:     2.30 cm LV SV:         37 LV SV Index:   23 LVOT Area:     4.15 cm  LV Volumes (MOD) LV vol d, MOD A2C: 19.4 ml  LV vol d, MOD A4C: 14.5 ml LV vol s, MOD A2C: 9.4 ml LV vol s, MOD A4C: 3.3 ml LV SV MOD A2C:     10.0  ml LV SV MOD A4C:     14.5 ml LV SV MOD BP:      12.1 ml RIGHT VENTRICLE RV S prime:     17.70 cm/s TAPSE (M-mode): 1.4 cm LEFT ATRIUM             Index       RIGHT ATRIUM           Index LA diam:        3.70 cm 2.29 cm/m  RA Area:     11.40 cm LA Vol (A2C):   45.9 ml 28.46 ml/m RA Volume:   19.80 ml  12.28 ml/m LA Vol (A4C):   16.2 ml 10.05 ml/m LA Biplane Vol: 27.5 ml 17.05 ml/m  AORTIC VALVE                    PULMONIC VALVE AV Area (Vmax):    1.67 cm     PV Vmax:       0.93 m/s AV Area (Vmean):   1.64 cm     PV Vmean:      65.400 cm/s AV Area (VTI):     1.08 cm     PV VTI:        0.193 m AV Vmax:           193.00 cm/s  PV Peak grad:  3.5 mmHg AV Vmean:          129.000 cm/s PV Mean grad:  2.0 mmHg AV VTI:            0.341 m AV Peak Grad:      14.9 mmHg AV Mean Grad:      8.0 mmHg LVOT Vmax:         77.60 cm/s LVOT Vmean:        50.800 cm/s LVOT VTI:          0.089 m LVOT/AV VTI ratio: 0.26  AORTA Ao Root diam: 3.40 cm Ao Asc diam:  3.90 cm MITRAL VALVE               TRICUSPID VALVE MV Area (PHT): 3.21 cm    TR Peak grad:   22.5 mmHg MV Decel Time: 236 msec    TR Vmax:        237.00 cm/s MV E velocity: 81.20 cm/s                            SHUNTS                            Systemic VTI:  0.09 m                            Systemic Diam: 2.30 cm Kirk Ruths MD Electronically signed by Kirk Ruths MD Signature Date/Time: 07/28/2020/10:26:00 AM    Final    Disposition   Pt is being discharged home today in good condition.  Follow-up Plans & Appointments    Follow-up Information     Isaiah Serge, NP Follow up on 08/16/2020.   Specialties: Cardiology, Radiology Why: at 145pm Contact information: [place] [address] [phone]                 Discharge Medications   Allergies as of 07/28/2020       Reactions  Codeine Nausea And Vomiting    Passed out   Betadine [povidone Iodine]    burning   Rocephin [ceftriaxone Sodium In Dextrose] Itching        Medication List     STOP taking these medications    warfarin 3 MG tablet Commonly known as: COUMADIN       TAKE these medications    albuterol 108 (90 Base) MCG/ACT inhaler Commonly known as: VENTOLIN HFA Inhale 2 puffs into the lungs every 6 (six) hours as needed for wheezing or shortness of breath. Pro   ALPRAZolam 0.25 MG tablet Commonly known as: XANAX Take 0.25-0.5 mg by mouth at bedtime.   amLODipine 5 MG tablet Commonly known as: NORVASC Take 1 tablet (5 mg total) by mouth daily. PLEASE CONTACT OFFICE FOR ADDITIONAL REFILLS SECOND ATTEMPT   Breo Ellipta 100-25 MCG/INH Aepb Generic drug: fluticasone furoate-vilanterol Inhale 1 puff into the lungs daily as needed (Shortness of Breath).   EYE VITAMINS PO Take 1 tablet by mouth in the morning and at bedtime. preservision   famotidine 20 MG tablet Commonly known as: PEPCID Take 20 mg by mouth as needed for heartburn or indigestion.   latanoprost 0.005 % ophthalmic solution Commonly known as: XALATAN Place 1 drop into the right eye every evening.   lisinopril 40 MG tablet Commonly known as: ZESTRIL Take 20 mg by mouth daily.   metoprolol succinate 100 MG 24 hr tablet Commonly known as: TOPROL-XL Take 100 mg daily by mouth. Take with or immediately following a meal.   nitroGLYCERIN 0.4 MG SL tablet Commonly known as: NITROSTAT Place 0.4 mg under the tongue every 5 (five) minutes as needed for chest pain.   ondansetron 8 MG tablet Commonly known as: ZOFRAN Take 8 mg by mouth daily as needed (Car sick).   pravastatin 40 MG tablet Commonly known as: PRAVACHOL Take 1 tablet (40 mg total) by mouth daily. Once daily What changed: additional instructions   traMADol 50 MG tablet Commonly known as: ULTRAM Take 100 mg by mouth 2 (two) times daily.   Xarelto 15 MG Tabs tablet Generic drug:  Rivaroxaban Take 1 tablet (15 mg total) by mouth daily with supper. What changed:  medication strength how much to take when to take this   Rivaroxaban 15 MG Tabs tablet Commonly known as: XARELTO Take 1 tablet (15 mg total) by mouth daily with supper. What changed: You were already taking a medication with the same name, and this prescription was added. Make sure you understand how and when to take each.   Rivaroxaban 15 MG Tabs tablet Commonly known as: XARELTO Take 1 tablet (15 mg total) by mouth daily with supper. Start taking on: July 29, 2020 What changed: You were already taking a medication with the same name, and this prescription was added. Make sure you understand how and when to take each.        Outstanding Labs/Studies   None   Duration of Discharge Encounter   Greater than 30 minutes including physician time.  Signed, Kathyrn Drown, NP 07/28/2020, 2:41 PM

## 2020-07-28 NOTE — Progress Notes (Addendum)
Progress Note  Patient Name: Anna Farrell Date of Encounter: 07/28/2020  Primary Cardiologist: Dr. Fransico Him, MD   Subjective   Feels much better today. HR controlled. Getting echocardiogram   Inpatient Medications    Scheduled Meds:  ALPRAZolam  0.25-0.5 mg Oral QHS   fluticasone furoate-vilanterol  1 puff Inhalation Daily   latanoprost  1 drop Right Eye QPM   metoprolol succinate  100 mg Oral Daily   multivitamin  1 tablet Oral Daily   pravastatin  40 mg Oral Daily   sodium chloride flush  3 mL Intravenous Q12H   traMADol  100 mg Oral BID   Continuous Infusions:  sodium chloride     PRN Meds: sodium chloride, acetaminophen, albuterol, ALPRAZolam, famotidine, nitroGLYCERIN, ondansetron (ZOFRAN) IV, sodium chloride flush, zolpidem   Vital Signs    Vitals:   07/27/20 2002 07/27/20 2057 07/27/20 2116 07/28/20 0504  BP: 113/81  94/83 116/78  Pulse: (!) 101 77 77 73  Resp:  '17 18 20  '$ Temp:  98.6 F (37 C) 99.3 F (37.4 C) 98.3 F (36.8 C)  TempSrc:  Oral Oral Oral  SpO2:  97% 100% 97%  Weight:   61.4 kg 60.8 kg  Height:   '5\' 2"'$  (1.575 m)    No intake or output data in the 24 hours ending 07/28/20 0706 Filed Weights   07/27/20 2116 07/28/20 0504  Weight: 61.4 kg 60.8 kg    Physical Exam   General: Elderly, NAD Neck: Negative for carotid bruits. No JVD Lungs:Clear to ausculation bilaterally. Breathing is unlabored. Cardiovascular: Irregularly irregular. No murmurs Abdomen: Soft, non-tender, non-distended. No obvious abdominal masses. Extremities: No edema. Radial pulses 2+ bilaterally Neuro: Alert and oriented. No focal deficits. No facial asymmetry. MAE spontaneously. Psych: Responds to questions appropriately with normal affect.    Labs    Chemistry Recent Labs  Lab 07/27/20 1205 07/28/20 0155  NA 137 137  K 4.2 3.7  CL 102 100  CO2 25 30  GLUCOSE 142* 122*  BUN 18 11  CREATININE 0.94 0.81  CALCIUM 9.6 9.0  PROT 5.8* 5.4*  ALBUMIN  3.1* 2.8*  AST 24 20  ALT 10 13  ALKPHOS 68 56  BILITOT 1.0 1.1  GFRNONAA 58* >60  ANIONGAP 10 7     Hematology Recent Labs  Lab 07/27/20 1205  WBC 10.7*  RBC 4.66  HGB 12.5  HCT 39.9  MCV 85.6  MCH 26.8  MCHC 31.3  RDW 16.3*  PLT 259    Cardiac EnzymesNo results for input(s): TROPONINI in the last 168 hours. No results for input(s): TROPIPOC in the last 168 hours.   BNP Recent Labs  Lab 07/27/20 1205  BNP 191.2*     DDimer  Recent Labs  Lab 07/27/20 1205  DDIMER 0.28     Radiology    DG Chest Portable 1 View  Result Date: 07/27/2020 CLINICAL DATA:  Shortness of breath. EXAM: PORTABLE CHEST 1 VIEW COMPARISON:  Chest x-ray 06/10/2017 FINDINGS: The pacer wires are stable.  No complicating features. The cardiac silhouette, mediastinal and hilar contours are within normal limits and stable. There is stable moderate tortuosity and calcification of the thoracic aorta. The lungs are clear. No pleural effusions or pulmonary lesions. The bony thorax is intact. IMPRESSION: No acute cardiopulmonary findings. Electronically Signed   By: Marijo Sanes M.D.   On: 07/27/2020 13:16    Telemetry    07/28/20 AF with HR 70-80 - Personally Reviewed  ECG  No new tracing as of 07/28/20 - Personally Reviewed  Cardiac Studies   ECHO: 05/10/2013 - Left ventricle: The cavity size was normal. Systolic    function was normal. The estimated ejection fraction was   in the range of 55% to 60%. Wall motion was normal; there   were no regional wall motion abnormalities. - Aortic valve: Moderate thickening and calcification.  - Aorta: Ascending aortic diameter: 30m (S).  - Ascending aorta: The ascending aorta was mildly dilated.  - Mitral valve: Calcified annulus.  - Left atrium: The atrium was mildly dilated.     CARDIAC CATH: 06/22/2007 RESULTS:  1. Right heart cath data:  Right atrial pressure 7/5 with a mean of 4 mmHg.  RV pressure 27/3 with a mean of 9 mmHg.  PA pressure 26/10  with a mean of 17 mmHg.  Pulmonary capillary wedge 10/9 with a mean of 7 mmHg.  Pulmonary artery O2 saturations 64%.  Right atrial O2 saturation 97%.  Aortic O2 saturation 93%.  Cardiac output by Fick was 4.2.  Cardiac index by Fick 2.4.  LVEDP was an 17 mmHg.  LV pressure 146/21 mmHg.  Aortic pressure 146/78 mmHg.    The left coronary artery had a dual ostium to the left anterior  descending and left circumflex artery.  The left anterior descending artery is widely patent throughout its course of the apex.  It gives rise to a very large first diagonal branch, which bifurcates into 2 daughter branches, both of which are widely patent and then gives rise to a second diagonal which is widely patent.    The left circumflex is widely patent throughout its course in the AV  groove.  It gives rise to a large first obtuse marginal branch which is widely patent and then a second obtuse marginal branch which bifurcates into 2 daughter branches, both of which are widely patent.  The ongoing circumflex traverses the AV groove and is widely patent.    The right coronary is widely patent throughout its course and distally bifurcates into posterior descending artery and posterolateral artery, both of which are widely patent.    Left ventriculography shows normal LV function with hyperdynamic function, EF of 70%.  There was a PVC-induced mitral regurgitation.    ASSESSMENT:  1. Normal coronary arteries.  2. Normal right heart cath pressures.  3. Normal left ventricular function.  4. Mildly elevated left ventricular end-diastolic volume consistent      with mild diastolic dysfunction.  5. Shortness of breath consistent with primary pulmonary etiology.      Pulmonary function test did show small R-wave obstructive disease.  Patient Profile     87y.o. female with a hx of of chronic atrial fibrillation, orthostatic hypotension, Abbott (STJ) PPM, asymptomatic PVC's and severe OSA, who is being seen 07/27/2020 for  the evaluation of chest pain at the request of Dr TSherry Ruffing   Assessment & Plan    1. Chest pain: -Presented after acute onset of left shoulder pain with radiation to left chest with associated SOB -HsT 7>>9 -No recurrence -HRs improved this AM  -Echocardiogram pending   2. HTN: -Soft, 108/70>>116/78>>94/83 -Continue current regimen   3. Chronic atrial fibrillation: -Rates better controlled today in the 70-80 range  -On PTA Coumadin with plans to transition to Xarelto with PCP  -INR today remains elevated at 2.5 -Restarted on Metoprolol    Signed, JKathyrn DrownNP-C HEnoreePager: [phone]/22/2022, 7:06 AM     For questions or updates, please  contact   Please consult [email or web address] for contact info under Cardiology/STEMI.  Patient seen and examined with Kathyrn Drown, NP-C.  Agree as above, with the following exceptions and changes as noted below.  She feels well overall and has no recurrent chest pain or shortness of breath. Gen: NAD, CV: RRR, no murmurs, Lungs: clear, Abd: soft, Extrem: Warm, well perfused, no edema, Neuro/Psych: alert and oriented x 3, normal mood and affect. All available labs, radiology testing, previous records reviewed.  Her echocardiogram shows hyperdynamic LV function and no regional wall motion abnormalities or concerning findings for etiology of chest pain.  We discussed hospital dismissal today and cardiology follow-up in the next several months.  She follows closely with her primary care physician as well.  Heart rates much better controlled now that she is back on her home metoprolol succinate 100 mg daily.  Continue other home medications.  Elouise Munroe, MD 07/28/20 12:45 PM

## 2020-07-28 NOTE — Progress Notes (Signed)
*  PRELIMINARY RESULTS* Echocardiogram 2D Echocardiogram has been performed.  Luisa Hart RDCS 07/28/2020, 9:08 AM

## 2020-07-31 NOTE — Telephone Encounter (Signed)
**Note De-Identified Journee Bobrowski Obfuscation** Transition Care Management Unsuccessful Follow-up Telephone Call  Date of discharge and from where: 07/28/2020  Attempts:  1st Attempt  Reason for unsuccessful TCM follow-up call:  No answer so I left a message on the pts VM asking her to call Jeani Hawking at [phone] at Dr Allred/Turner's office at [facility].  FYI; the same phone number is listed for pts daughter Juliann Pulse [facility]) as well.

## 2020-08-01 NOTE — Telephone Encounter (Signed)
**Note De-Identified Anna Farrell Obfuscation** Patient's [name] [facility]) contacted regarding the pts discharge from [facility] on 07/28/2020.  Patient understands to follow up with provider Cecilie Kicks, NP on 08/16/2020 at 1:45 at [address] [place] in [place], [place] [address]. Patient understands discharge instructions? Yes Patient understands medications and regiment? Yes Patient understands to bring all medications to this visit? Yes  Ask patient:  Are you enrolled in My Chart: Per Anna Pulse the pt has signed up for [facility].  Per Anna Pulse the pt is currently doing well and is without c/o CP/discomfort, SOB, nausea, diaphoresis, dizziness, or lightheadedness.  Anna Pulse does have Dr Allred/Turner's office phone number at [facility] to call if they have any questions or concerns.  Anna Pulse thanked me for calling.

## 2020-08-02 ENCOUNTER — Other Ambulatory Visit (HOSPITAL_BASED_OUTPATIENT_CLINIC_OR_DEPARTMENT_OTHER): Payer: Self-pay

## 2020-08-04 ENCOUNTER — Telehealth (HOSPITAL_BASED_OUTPATIENT_CLINIC_OR_DEPARTMENT_OTHER): Payer: Self-pay | Admitting: Pharmacist

## 2020-08-04 NOTE — Telephone Encounter (Signed)
Pharmacy Transitions of Care Follow-up Telephone Call  Date of discharge: 07/28/20  Discharge Diagnosis: Chest pain, A-fib  How have you been since you were released from the hospital? The patient states she is doing well and daughter, Anna Farrell, agrees.  Anna Farrell stated Anna Farrell was eating lunch when I called. Our call was on speaker phone so that both the patient and her daughter could participate.  Medication changes made at discharge:  - START: Xarelto 15 mg 1 QD with supper  - STOPPED: Warfarin    Medication changes verified by the patient? Yes    Medication Accessibility:  Home Pharmacy: [facility] [phone] [place] [phone]   Was the patient provided with refills on discharged medications? Yes   Have all prescriptions been transferred from [facility] to home pharmacy? Yes.  The hospital sent a second Rx for the refills to her home pharmacy.   Is the patient able to afford medications? Yes Notable copays: $30.00 per month Eligible patient assistance: N/A    Medication Review:   RIVAROXABAN (XARELTO)  Rivaroxaban 15 mg 1 QD after supper initiated on 07/29/2020. Will continue for several months. - Discussed importance of taking medication with food and around the same time everyday  - Reviewed potential DDIs with patient  - Advised patient of medications to avoid (NSAIDs, ASA)  - Educated that Tylenol (acetaminophen) will be the preferred analgesic to prevent risk of bleeding  - Emphasized importance of monitoring for signs and symptoms of bleeding (abnormal bruising, prolonged bleeding, nose bleeds, bleeding from gums, discolored urine, black tarry stools)  - Advised patient to alert all providers of anticoagulation therapy prior to starting a new medication or having a procedure     Follow-up Appointments:  [facility] f/u appt confirmed? Yes  Scheduled to see Anna Kicks, NP on 08/16/2020 @ 1:45 pm. Then follow up appointments continue on 09/13/20,  12/13/2020, 03/14/21, and 06/13/21.  If their condition worsens, is the pt aware to call PCP, cardiologist, or go to the Emergency Dept.? Yes  Final Patient Assessment: The patient and her daughter were both very pleasant and stated that she is doing well.  Anna Farrell commented on how well the [facility] team educated them about Xarelto during the discharge process.  Anna Farrell explained that everyone was extremely nice and helpful and that her mother had a great patient experience.  Anna Farrell has a full understanding of Xarelto and how important it is to give it to her mother everyday at the same time after a meal.  Anna Farrell is taking Xarelto every evening after supper closer to bedtime.  They didn't have any questions about follow up appointments.

## 2020-08-16 ENCOUNTER — Other Ambulatory Visit: Payer: Self-pay

## 2020-08-16 ENCOUNTER — Ambulatory Visit (INDEPENDENT_AMBULATORY_CARE_PROVIDER_SITE_OTHER): Payer: Medicare Other | Admitting: Cardiology

## 2020-08-16 ENCOUNTER — Encounter: Payer: Self-pay | Admitting: Cardiology

## 2020-08-16 VITALS — BP 100/70 | HR 75 | Ht 62.0 in | Wt 136.6 lb

## 2020-08-16 DIAGNOSIS — I482 Chronic atrial fibrillation, unspecified: Secondary | ICD-10-CM

## 2020-08-16 DIAGNOSIS — Z950 Presence of cardiac pacemaker: Secondary | ICD-10-CM | POA: Diagnosis not present

## 2020-08-16 DIAGNOSIS — I10 Essential (primary) hypertension: Secondary | ICD-10-CM

## 2020-08-16 DIAGNOSIS — E782 Mixed hyperlipidemia: Secondary | ICD-10-CM | POA: Diagnosis not present

## 2020-08-16 DIAGNOSIS — R0789 Other chest pain: Secondary | ICD-10-CM | POA: Diagnosis not present

## 2020-08-16 DIAGNOSIS — I4891 Unspecified atrial fibrillation: Secondary | ICD-10-CM

## 2020-08-16 DIAGNOSIS — I495 Sick sinus syndrome: Secondary | ICD-10-CM | POA: Diagnosis not present

## 2020-08-16 DIAGNOSIS — Z7901 Long term (current) use of anticoagulants: Secondary | ICD-10-CM

## 2020-08-16 NOTE — Progress Notes (Signed)
Cardiology Office Note   Date:  08/16/2020   ID:  Lillis, Beier February 01, 1927, MRN [id number]  PCP:  [name], MD  Cardiologist:  Dr. Radford Pax    Chief Complaint  Patient presents with   Hospitalization Follow-up      History of Present Illness: Anna Farrell is a 87 y.o. female who presents for post hospitlaization.   She has a hx of of chronic atrial fibrillation, orthostatic hypotension, Abbott (STJ) PPM, asymptomatic PVC's and severe OSA,   Admitted 07/27/20 with chest pain.  Beginning in lt shoulder.  Found to be in a fib with RVR  hs troponin neg with 7 & 9.  Her coumadin was changed to xarelto  Today is not a good day for her.  She has episodes of 1-2 days of weakness and today is one of those days.  No chest pain, some dyspnea with activity.  She is glad she has been switched to Xarelto.  No bleeding. No issues.     Past Medical History:  Diagnosis Date   Asymptomatic PVCs    Cholelithiasis    Chronic low back pain 2008   crushed vertebrae   Colitis 5/13   Extrinsic asthma, unspecified    LOV Dr Joya Gaskins 7/12   Generalized osteoarthrosis, unspecified site    History of colonic polyps    Hyperlipidemia    Hypertension    Iron deficiency anemia    Long term (current) use of anticoagulants    Orthostatic hypotension    Osteoporosis    Pacemaker 10/11/06   implanted by Dr Leonia Reeves for pauses > 4 seconds with afib   Pancreatitis    Permanent atrial fibrillation ([place])    Tachycardia-bradycardia syndrome ([place])    Vasovagal syncope     Past Surgical History:  Procedure Laterality Date   ABDOMINAL HYSTERECTOMY     APPENDECTOMY     CARDIAC CATHETERIZATION  2009   Normal coronary arteries   CHOLECYSTECTOMY  09/17/2011   Procedure: LAPAROSCOPIC CHOLECYSTECTOMY;  Surgeon: Rolm Bookbinder, MD;  Location: [facility];  Service: General;;   COLONOSCOPY     ERCP N/A 04/07/2013   Procedure: ENDOSCOPIC RETROGRADE CHOLANGIOPANCREATOGRAPHY (ERCP);  Surgeon: Jeryl Columbia,  MD;  Location: [facility];  Service: Endoscopy;  Laterality: N/A;   EUS  04/07/2013   Procedure: UPPER ENDOSCOPIC ULTRASOUND (EUS) RADIAL;  Surgeon: Jeryl Columbia, MD;  Location: [facility];  Service: Endoscopy;;   HIP ARTHROPLASTY Left 08/17/2013   Procedure: ARTHROPLASTY BIPOLAR HIP;  Surgeon: Renette Butters, MD;  Location: [facility];  Service: Orthopedics;  Laterality: Left;   PACEMAKER INSERTION  10/11/2006   SJM Zephyr XL DR implanted by Dr Leonia Reeves for afib with pause > 4 seconds   PPM GENERATOR CHANGEOUT N/A 03/16/2019   Procedure: PPM GENERATOR CHANGEOUT;  Surgeon: Thompson Grayer, MD;  Location: [facility];  Service: Cardiovascular;  Laterality: N/A;   VESICOVAGINAL FISTULA CLOSURE W/ TAH  1981     Current Outpatient Medications  Medication Sig Dispense Refill   albuterol (VENTOLIN HFA) 108 (90 Base) MCG/ACT inhaler Inhale 2 puffs into the lungs every 6 (six) hours as needed for wheezing or shortness of breath. Pro     ALPRAZolam (XANAX) 0.25 MG tablet Take 0.25-0.5 mg by mouth at bedtime.   0   amLODipine (NORVASC) 5 MG tablet Take 1 tablet (5 mg total) by mouth daily. PLEASE CONTACT OFFICE FOR ADDITIONAL REFILLS SECOND ATTEMPT 15 tablet 0   BREO ELLIPTA 100-25 MCG/INH AEPB  Inhale 1 puff into the lungs daily as needed (Shortness of Breath).   1   famotidine (PEPCID) 20 MG tablet Take 20 mg by mouth as needed for heartburn or indigestion.     latanoprost (XALATAN) 0.005 % ophthalmic solution Place 1 drop into the right eye every evening.     lisinopril (ZESTRIL) 40 MG tablet Take 20 mg by mouth daily.     metoprolol succinate (TOPROL-XL) 100 MG 24 hr tablet Take 100 mg daily by mouth. Take with or immediately following a meal.     Multiple Vitamins-Minerals (EYE VITAMINS PO) Take 1 tablet by mouth in the morning and at bedtime. preservision     nitroGLYCERIN (NITROSTAT) 0.4 MG SL tablet Place 0.4 mg under the tongue every 5 (five) minutes as needed for chest pain.     ondansetron  (ZOFRAN) 8 MG tablet Take 8 mg by mouth daily as needed (Car sick).      pravastatin (PRAVACHOL) 40 MG tablet Take 1 tablet (40 mg total) by mouth daily. Once daily 90 tablet 1   Rivaroxaban (XARELTO) 15 MG TABS tablet Take 1 tablet (15 mg total) by mouth daily with supper. 60 tablet 2   traMADol (ULTRAM) 50 MG tablet Take 100 mg by mouth 2 (two) times daily.     No current facility-administered medications for this visit.    Allergies:   Codeine, Betadine [povidone iodine], and Rocephin [ceftriaxone sodium in dextrose]    Social History:  The patient  reports that she has never smoked. She has never used smokeless tobacco. She reports that she does not drink alcohol and does not use drugs.   Family History:  The patient's family history includes Cancer in her mother; Hypertension in her mother; Pancreatitis in her mother.    ROS:  General:no colds or fevers, no weight changes Skin:no rashes or ulcers HEENT:no blurred vision, no congestion CV:see HPI PUL:see HPI GI:no diarrhea constipation or melena, no indigestion GU:no hematuria, no dysuria MS:no joint pain, no claudication Neuro:no syncope, no lightheadedness Endo:no diabetes, no thyroid disease  Wt Readings from Last 3 Encounters:  08/16/20 136 lb 9.6 oz (62 kg)  07/28/20 134 lb 0.6 oz (60.8 kg)  06/21/19 136 lb (61.7 kg)     PHYSICAL EXAM: VS:  BP 100/70   Pulse 75   Ht '5\' 2"'$  (1.575 m)   Wt 136 lb 9.6 oz (62 kg)   SpO2 97%   BMI 24.98 kg/m  , BMI Body mass index is 24.98 kg/m. General:Pleasant affect, NAD Skin:Warm and dry, brisk capillary refill HEENT:normocephalic, sclera clear, mucus membranes moist Neck:supple, no JVD, no bruits  Heart: irreg irreg without murmur, gallup, rub or click Lungs:clear without rales, rhonchi, or wheezes [id number], non tender, + BS, do not palpate liver spleen or masses Ext:no lower ext edema, 2+ pedal pulses, 2+ radial pulses Neuro:alert and oriented X 3, MAE, follows commands, +  facial symmetry    EKG:  EKG is ordered today. The ekg ordered today demonstrates atrial fib rate of 75 and PVC no ST changes   Recent Labs: 07/27/2020: B Natriuretic Peptide 191.2; Hemoglobin 12.5; Magnesium 1.9; Platelets 259 07/28/2020: ALT 13; BUN 11; Creatinine, Ser 0.81; Potassium 3.7; Sodium 137; TSH 2.779    Lipid Panel No results found for: CHOL, TRIG, HDL, CHOLHDL, VLDL, LDLCALC, LDLDIRECT     Other studies Reviewed: Additional studies/ records that were reviewed today include: .  Echocardiogram 07/28/20:   1. Aortic valve not well visualized but appears calcified; no  significant  AS by doppler.   2. Left ventricular ejection fraction, by estimation, is 70 to 75%. The  left ventricle has hyperdynamic function. The left ventricle has no  regional wall motion abnormalities. There is mild left ventricular  hypertrophy of the basal-septal segment. Left  ventricular diastolic parameters are indeterminate.   3. Right ventricular systolic function is normal. The right ventricular  size is normal. There is normal pulmonary artery systolic pressure.   4. The mitral valve is normal in structure. Trivial mitral valve  regurgitation. No evidence of mitral stenosis. Moderate mitral annular  calcification.   5. The aortic valve has an indeterminant number of cusps. Aortic valve  regurgitation is not visualized. No aortic stenosis is present.   6. Aortic dilatation noted. There is mild dilatation of the ascending  aorta, measuring 39 mm.   7. The inferior vena cava is normal in size with greater than 50%  respiratory variability, suggesting right atrial pressure of 3 mmHg. _____________ ASSESSMENT AND PLAN:  1.  Recent chest pain with no MI, now resolved.  FELT TO BE MUSCULAR SKELETAL, SHE HAD SLEPT IN A CHAIR AND DID NOT TAKE HER MEDS. 2.  A fib with RVR on arrival to ER rate now controlled.  3.  Chronic a fib stable today - anticoagulation on xarelto now, switched in hospital  to xarelto 15 mg daily. No bleeding - will check CBC and CMP on new medications..  4. HLD continue pravacho5.  HX ppm FOR TACHY-BRADY SYNDROME.  Follow up with Dr. Radford Pax in 4-5 months and with EP as instructed.  The following changes have been made:  See above Labs/ tests ordered today include:see above  Disposition:   FU:  see above  Signed, Cecilie Kicks, NP  08/16/2020 1:54 PM    [facility] [address] [place], [place] Phone: [phone]; Fax: [phone]

## 2020-08-16 NOTE — Patient Instructions (Addendum)
Medication Instructions:  Your physician recommends that you continue on your current medications as directed. Please refer to the Current Medication list given to you today.  *If you need a refill on your cardiac medications before your next appointment, please call your pharmacy*   Lab Work: TODAY:  CMP & CBC  If you have labs (blood work) drawn today and your tests are completely normal, you will receive your results only by: [place] (if you have MyChart) OR A paper copy in the mail If you have any lab test that is abnormal or we need to change your treatment, we will call you to review the results.   Testing/Procedures: None ordered   Follow-Up: At [facility], you and your health needs are our priority.  As part of our continuing mission to provide you with exceptional heart care, we have created designated Provider Care Teams.  These Care Teams include your primary Cardiologist (physician) and Advanced Practice Providers (APPs -  Physician Assistants and Nurse Practitioners) who all work together to provide you with the care you need, when you need it.  We recommend signing up for the patient portal called "MyChart".  Sign up information is provided on this After Visit Summary.  MyChart is used to connect with patients for Virtual Visits (Telemedicine).  Patients are able to view lab/test results, encounter notes, upcoming appointments, etc.  Non-urgent messages can be sent to your provider as well.   To learn more about what you can do with MyChart, go to [email or web address].    Your next appointment:   5 month(s)  The format for your next appointment:   In Person  Provider:   You may see Fransico Him, MD or one of the following Advanced Practice Providers on your designated Care Team:   Melina Copa, PA-C Ermalinda Barrios, PA-C   Other Instructions

## 2020-08-17 LAB — COMPREHENSIVE METABOLIC PANEL
ALT: 9 IU/L (ref 0–32)
AST: 14 IU/L (ref 0–40)
Albumin/Globulin Ratio: 1.7 (ref 1.2–2.2)
Albumin: 3.9 g/dL (ref 3.5–4.6)
Alkaline Phosphatase: 83 IU/L (ref 44–121)
BUN/Creatinine Ratio: 22 (ref 12–28)
BUN: 17 mg/dL (ref 10–36)
Bilirubin Total: 0.4 mg/dL (ref 0.0–1.2)
CO2: 22 mmol/L (ref 20–29)
Calcium: 9.8 mg/dL (ref 8.7–10.3)
Chloride: 104 mmol/L (ref 96–106)
Creatinine, Ser: 0.78 mg/dL (ref 0.57–1.00)
Globulin, Total: 2.3 g/dL (ref 1.5–4.5)
Glucose: 109 mg/dL — ABNORMAL HIGH (ref 65–99)
Potassium: 4.6 mmol/L (ref 3.5–5.2)
Sodium: 142 mmol/L (ref 134–144)
Total Protein: 6.2 g/dL (ref 6.0–8.5)
eGFR: 72 mL/min/{1.73_m2} (ref >59)

## 2020-08-17 LAB — CBC
Hematocrit: 37.2 % (ref 34.0–46.6)
Hemoglobin: 12.3 g/dL (ref 11.1–15.9)
MCH: 26.9 pg (ref 26.6–33.0)
MCHC: 33.1 g/dL (ref 31.5–35.7)
MCV: 81 fL (ref 79–97)
Platelets: 309 10*3/uL (ref 150–450)
RBC: 4.58 x10E6/uL (ref 3.77–5.28)
RDW: 15 % (ref 11.7–15.4)
WBC: 7 10*3/uL (ref 3.4–10.8)

## 2020-08-22 ENCOUNTER — Other Ambulatory Visit: Payer: Self-pay | Admitting: Internal Medicine

## 2020-09-13 ENCOUNTER — Ambulatory Visit (INDEPENDENT_AMBULATORY_CARE_PROVIDER_SITE_OTHER): Payer: Medicare Other

## 2020-09-13 DIAGNOSIS — I4891 Unspecified atrial fibrillation: Secondary | ICD-10-CM

## 2020-09-13 LAB — CUP PACEART REMOTE DEVICE CHECK
Battery Remaining Longevity: 118 mo
Battery Remaining Percentage: 91 %
Battery Voltage: 3.02 V
Brady Statistic RV Percent Paced: 6.7 %
Date Time Interrogation Session: 20220907020015
Implantable Lead Implant Date: 20080804
Implantable Lead Location: 753860
Implantable Pulse Generator Implant Date: 20210309
Lead Channel Impedance Value: 330 Ohm
Lead Channel Pacing Threshold Amplitude: 1.25 V
Lead Channel Pacing Threshold Pulse Width: 0.6 ms
Lead Channel Sensing Intrinsic Amplitude: 3.8 mV
Lead Channel Setting Pacing Amplitude: 2.5 V
Lead Channel Setting Pacing Pulse Width: 0.6 ms
Lead Channel Setting Sensing Sensitivity: 2 mV
Pulse Gen Model: 1272
Pulse Gen Serial Number: 9153964

## 2020-09-21 NOTE — Progress Notes (Signed)
Remote pacemaker transmission.   

## 2020-12-13 ENCOUNTER — Ambulatory Visit (INDEPENDENT_AMBULATORY_CARE_PROVIDER_SITE_OTHER): Payer: Medicare Other

## 2020-12-13 DIAGNOSIS — I495 Sick sinus syndrome: Secondary | ICD-10-CM

## 2020-12-13 LAB — CUP PACEART REMOTE DEVICE CHECK
Battery Remaining Longevity: 116 mo
Battery Remaining Percentage: 89 %
Battery Voltage: 3.02 V
Brady Statistic RV Percent Paced: 6.6 %
Date Time Interrogation Session: 20221207020012
Implantable Lead Implant Date: 20080804
Implantable Lead Location: 753860
Implantable Pulse Generator Implant Date: 20210309
Lead Channel Impedance Value: 350 Ohm
Lead Channel Pacing Threshold Amplitude: 1.25 V
Lead Channel Pacing Threshold Pulse Width: 0.6 ms
Lead Channel Sensing Intrinsic Amplitude: 4.1 mV
Lead Channel Setting Pacing Amplitude: 2.5 V
Lead Channel Setting Pacing Pulse Width: 0.6 ms
Lead Channel Setting Sensing Sensitivity: 2 mV
Pulse Gen Model: 1272
Pulse Gen Serial Number: 9153964

## 2020-12-20 DIAGNOSIS — Z23 Encounter for immunization: Secondary | ICD-10-CM | POA: Diagnosis not present

## 2020-12-21 NOTE — Progress Notes (Signed)
Remote pacemaker transmission.   

## 2021-01-16 DIAGNOSIS — H401413 Capsular glaucoma with pseudoexfoliation of lens, right eye, severe stage: Secondary | ICD-10-CM | POA: Diagnosis not present

## 2021-01-16 DIAGNOSIS — D3132 Benign neoplasm of left choroid: Secondary | ICD-10-CM | POA: Diagnosis not present

## 2021-01-16 DIAGNOSIS — H353133 Nonexudative age-related macular degeneration, bilateral, advanced atrophic without subfoveal involvement: Secondary | ICD-10-CM | POA: Diagnosis not present

## 2021-01-16 DIAGNOSIS — Z961 Presence of intraocular lens: Secondary | ICD-10-CM | POA: Diagnosis not present

## 2021-01-30 ENCOUNTER — Telehealth: Payer: Self-pay

## 2021-01-30 ENCOUNTER — Ambulatory Visit: Payer: Medicare Other | Admitting: Cardiology

## 2021-01-30 NOTE — Telephone Encounter (Signed)
°  Patient Consent for Virtual Visit            Anna Farrell has provided verbal consent on 01/30/2021 for a virtual visit (video or telephone).   CONSENT FOR VIRTUAL VISIT FOR:  [name]  By participating in this virtual visit I agree to the following:  I hereby voluntarily request, consent and authorize [facility] and its employed or contracted physicians, physician assistants, nurse practitioners or other licensed health care professionals (the Practitioner), to provide me with telemedicine health care services (the Services") as deemed necessary by the treating Practitioner. I acknowledge and consent to receive the Services by the Practitioner via telemedicine. I understand that the telemedicine visit will involve communicating with the Practitioner through live audiovisual communication technology and the disclosure of certain medical information by electronic transmission. I acknowledge that I have been given the opportunity to request an in-person assessment or other available alternative prior to the telemedicine visit and am voluntarily participating in the telemedicine visit.  I understand that I have the right to withhold or withdraw my consent to the use of telemedicine in the course of my care at any time, without affecting my right to future care or treatment, and that the Practitioner or I may terminate the telemedicine visit at any time. I understand that I have the right to inspect all information obtained and/or recorded in the course of the telemedicine visit and may receive copies of available information for a reasonable fee.  I understand that some of the potential risks of receiving the Services via telemedicine include:  Delay or interruption in medical evaluation due to technological equipment failure or disruption; Information transmitted may not be sufficient (e.g. poor resolution of images) to allow for appropriate medical decision making by the Practitioner;  and/or  In rare instances, security protocols could fail, causing a breach of personal health information.  Furthermore, I acknowledge that it is my responsibility to provide information about my medical history, conditions and care that is complete and accurate to the best of my ability. I acknowledge that Practitioner's advice, recommendations, and/or decision may be based on factors not within their control, such as incomplete or inaccurate data provided by me or distortions of diagnostic images or specimens that may result from electronic transmissions. I understand that the practice of medicine is not an exact science and that Practitioner makes no warranties or guarantees regarding treatment outcomes. I acknowledge that a copy of this consent can be made available to me via my patient portal ([place]), or I can request a printed copy by calling the office of [facility].    I understand that my insurance will be billed for this visit.   I have read or had this consent read to me. I understand the contents of this consent, which adequately explains the benefits and risks of the Services being provided via telemedicine.  I have been provided ample opportunity to ask questions regarding this consent and the Services and have had my questions answered to my satisfaction. I give my informed consent for the services to be provided through the use of telemedicine in my medical care

## 2021-01-31 ENCOUNTER — Encounter: Payer: Self-pay | Admitting: Cardiology

## 2021-01-31 ENCOUNTER — Telehealth (INDEPENDENT_AMBULATORY_CARE_PROVIDER_SITE_OTHER): Payer: Medicare Other | Admitting: Cardiology

## 2021-01-31 ENCOUNTER — Other Ambulatory Visit: Payer: Self-pay

## 2021-01-31 VITALS — Ht 62.0 in | Wt 135.0 lb

## 2021-01-31 DIAGNOSIS — I495 Sick sinus syndrome: Secondary | ICD-10-CM

## 2021-01-31 DIAGNOSIS — I951 Orthostatic hypotension: Secondary | ICD-10-CM

## 2021-01-31 DIAGNOSIS — I482 Chronic atrial fibrillation, unspecified: Secondary | ICD-10-CM | POA: Diagnosis not present

## 2021-01-31 DIAGNOSIS — I10 Essential (primary) hypertension: Secondary | ICD-10-CM | POA: Diagnosis not present

## 2021-01-31 DIAGNOSIS — G4733 Obstructive sleep apnea (adult) (pediatric): Secondary | ICD-10-CM | POA: Diagnosis not present

## 2021-01-31 NOTE — Patient Instructions (Signed)

## 2021-01-31 NOTE — Progress Notes (Signed)
Virtual Visit via Video Note   This visit type was conducted due to national recommendations for restrictions regarding the COVID-19 Pandemic (e.g. social distancing) in an effort to limit this patient's exposure and mitigate transmission in our community.  Due to her co-morbid illnesses, this patient is at least at moderate risk for complications without adequate follow up.  This format is felt to be most appropriate for this patient at this time.  All issues noted in this document were discussed and addressed.  A limited physical exam was performed with this format.  Please refer to the patient's chart for her consent to telehealth for [facility].       Date:  01/31/2021   ID:  Anna Farrell, DOB 1927-07-09, MRN [id number] The patient was identified using 2 identifiers.  Patient Location: Home Provider Location: Home Office   PCP:  [name], MD   [facility] Providers Cardiologist:  Fransico Him, MD Electrophysiologist:  Thompson Grayer, MD     Evaluation Performed:  Follow-Up Visit  Chief Complaint:  SHe  History of Present Illness:    Anna Farrell is a 88 y.o. female with  a hx of chronic atrial fibrillation, orthostatic hypotension, PPM, asymptomatic PVC's and severe OSA . She was admited in July 22 with CP and shoulder pain and was found to be in afib with RVR.  hsTrop was neg x 2.  At that time her coumadin was changed to Xarelto.  It was felt her CP was due to MSK etiology.    She is here today for followup and is doing well.  She has some chronic DOE that is stable and occurs only when walking in the house.  She denies any chest pain or pressure, PND, orthopnea, LE edema, dizziness, palpitations or syncope. She is compliant with her meds and is tolerating meds with no SE.   She does have OSA but has not used her CPAP since prior to COVID 19 as it really affected her sleep  The patient does not have symptoms concerning for COVID-19 infection (fever, chills,  cough, or new shortness of breath).    Past Medical History:  Diagnosis Date   Asymptomatic PVCs    Cholelithiasis    Chronic low back pain 2008   crushed vertebrae   Colitis 5/13   Extrinsic asthma, unspecified    LOV Dr Joya Gaskins 7/12   Generalized osteoarthrosis, unspecified site    History of colonic polyps    Hyperlipidemia    Hypertension    Iron deficiency anemia    Long term (current) use of anticoagulants    Orthostatic hypotension    Osteoporosis    Pacemaker 10/11/06   implanted by Dr Leonia Reeves for pauses > 4 seconds with afib   Pancreatitis    Permanent atrial fibrillation ([place])    Tachycardia-bradycardia syndrome ([place])    Vasovagal syncope    Past Surgical History:  Procedure Laterality Date   ABDOMINAL HYSTERECTOMY     APPENDECTOMY     CARDIAC CATHETERIZATION  2009   Normal coronary arteries   CHOLECYSTECTOMY  09/17/2011   Procedure: LAPAROSCOPIC CHOLECYSTECTOMY;  Surgeon: Rolm Bookbinder, MD;  Location: [facility];  Service: General;;   COLONOSCOPY     ERCP N/A 04/07/2013   Procedure: ENDOSCOPIC RETROGRADE CHOLANGIOPANCREATOGRAPHY (ERCP);  Surgeon: Jeryl Columbia, MD;  Location: [facility];  Service: Endoscopy;  Laterality: N/A;   EUS  04/07/2013   Procedure: UPPER ENDOSCOPIC ULTRASOUND (EUS) RADIAL;  Surgeon: Jeryl Columbia, MD;  Location: [facility];  Service: Endoscopy;;   HIP ARTHROPLASTY Left 08/17/2013   Procedure: ARTHROPLASTY BIPOLAR HIP;  Surgeon: Renette Butters, MD;  Location: [place];  Service: Orthopedics;  Laterality: Left;   PACEMAKER INSERTION  10/11/2006   SJM Zephyr XL DR implanted by Dr Leonia Reeves for afib with pause > 4 seconds   PPM GENERATOR CHANGEOUT N/A 03/16/2019   Procedure: PPM GENERATOR CHANGEOUT;  Surgeon: Thompson Grayer, MD;  Location: [place] [facility];  Service: Cardiovascular;  Laterality: N/A;   VESICOVAGINAL FISTULA CLOSURE W/ TAH  1981     Current Meds  Medication Sig   acetaminophen (TYLENOL) 325 MG tablet Take 650 mg by mouth every 6  (six) hours as needed.   albuterol (VENTOLIN HFA) 108 (90 Base) MCG/ACT inhaler Inhale 2 puffs into the lungs every 6 (six) hours as needed for wheezing or shortness of breath. Pro   ALPRAZolam (XANAX) 0.25 MG tablet Take 0.25-0.5 mg by mouth at bedtime.    amLODipine (NORVASC) 5 MG tablet TAKE 1 TABLET(5 MG) BY MOUTH DAILY   BREO ELLIPTA 100-25 MCG/INH AEPB Inhale 1 puff into the lungs daily as needed (Shortness of Breath).    famotidine (PEPCID) 20 MG tablet Take 20 mg by mouth as needed for heartburn or indigestion.   latanoprost (XALATAN) 0.005 % ophthalmic solution Place 1 drop into the right eye every evening.   lisinopril (ZESTRIL) 40 MG tablet Take 20 mg by mouth daily.   metoprolol succinate (TOPROL-XL) 100 MG 24 hr tablet Take 100 mg daily by mouth. Take with or immediately following a meal.   Multiple Vitamins-Minerals (EYE VITAMINS PO) Take 1 tablet by mouth in the morning and at bedtime. preservision   nitroGLYCERIN (NITROSTAT) 0.4 MG SL tablet Place 0.4 mg under the tongue every 5 (five) minutes as needed for chest pain.   ondansetron (ZOFRAN) 8 MG tablet Take 8 mg by mouth daily as needed (Car sick).    pravastatin (PRAVACHOL) 40 MG tablet Take 1 tablet (40 mg total) by mouth daily. Once daily   Rivaroxaban (XARELTO) 15 MG TABS tablet Take 1 tablet (15 mg total) by mouth daily with supper.   traMADol (ULTRAM) 50 MG tablet Take 100 mg by mouth 2 (two) times daily.     Allergies:   Codeine, Betadine [povidone iodine], and Rocephin [ceftriaxone sodium in dextrose]   Social History   Tobacco Use   Smoking status: Never   Smokeless tobacco: Never  Substance Use Topics   Alcohol use: No   Drug use: No     Family Hx: The patient's family history includes Cancer in her mother; Hypertension in her mother; Pancreatitis in her mother.  ROS:   Please see the history of present illness.     All other systems reviewed and are negative.   Prior CV studies:   The following  studies were reviewed today:  Hospital notes 7/22  Labs/Other Tests and Data Reviewed:    EKG:  No ECG reviewed.  Recent Labs: 07/27/2020: B Natriuretic Peptide 191.2; Magnesium 1.9 07/28/2020: TSH 2.779 08/16/2020: ALT 9; BUN 17; Creatinine, Ser 0.78; Hemoglobin 12.3; Platelets 309; Potassium 4.6; Sodium 142   Recent Lipid Panel No results found for: CHOL, TRIG, HDL, CHOLHDL, LDLCALC, LDLDIRECT  Wt Readings from Last 3 Encounters:  01/31/21 135 lb (61.2 kg)  08/16/20 136 lb 9.6 oz (62 kg)  07/28/20 134 lb 0.6 oz (60.8 kg)     Risk Assessment/Calculations:    CHA2DS2-VASc Score = 5  This  indicates a 7.2% annual risk of stroke. The patient's score is based upon: CHF History: 0 HTN History: 1 Diabetes History: 0 Stroke History: 0 Vascular Disease History: 1 Age Score: 2 Gender Score: 1      Objective:    Vital Signs:  Ht 5\' 2"  (1.575 m)    Wt 135 lb (61.2 kg)    BMI 24.69 kg/m    VITAL SIGNS:  reviewed GEN:  no acute distress EYES:  sclerae anicteric, EOMI - Extraocular Movements Intact RESPIRATORY:  normal respiratory effort, symmetric expansion CARDIOVASCULAR:  no peripheral edema SKIN:  no rash, lesions or ulcers. MUSCULOSKELETAL:  no obvious deformities. NEURO:  alert and oriented x 3, no obvious focal deficit PSYCH:  normal affect  ASSESSMENT & PLAN:    Chronic atrial fibrillation -she is asymptomatic with no palpitations -she has no bleeding problems with DOAC -continue prescription drug management with Xarelto 15mg  daily and Toprol XL 100mg  daily with PRN refills -Labs from 8/22 were personally reviewed by me and showed SCr 0.78, K+ 4.6 and Hbg 12.3.  2.  OSA  -she no longer uses CPAP which I think is fine given her advanced age  99. HTN -BP is controlled when she goes to the PCP -continue prescription drug management with Ammlodipine 5mg  daily, Lisinopril 20mg  daily and Toprol XL 100mg  daily with PRN refills  4.  Tachybrady syndrome -s/p  PPM -followed by EP in device clinic   5.  Orthostatic hypotension -she has not had any dizziness of syncope recently     COVID-19 Education: The signs and symptoms of COVID-19 were discussed with the patient and how to seek care for testing (follow up with PCP or arrange E-visit).  The importance of social distancing was discussed today.  Time:   Today, I have spent 20 minutes with the patient with telehealth technology discussing the above problems.     Medication Adjustments/Labs and Tests Ordered: Current medicines are reviewed at length with the patient today.  Concerns regarding medicines are outlined above.   Tests Ordered: No orders of the defined types were placed in this encounter.   Medication Changes: No orders of the defined types were placed in this encounter.   Follow Up:  In Person in 1 year(s)  Signed, Fransico Him, MD  01/31/2021 2:12 PM    [facility]

## 2021-02-07 DIAGNOSIS — F411 Generalized anxiety disorder: Secondary | ICD-10-CM | POA: Diagnosis not present

## 2021-02-07 DIAGNOSIS — J4530 Mild persistent asthma, uncomplicated: Secondary | ICD-10-CM | POA: Diagnosis not present

## 2021-02-07 DIAGNOSIS — K219 Gastro-esophageal reflux disease without esophagitis: Secondary | ICD-10-CM | POA: Diagnosis not present

## 2021-02-07 DIAGNOSIS — I119 Hypertensive heart disease without heart failure: Secondary | ICD-10-CM | POA: Diagnosis not present

## 2021-02-07 DIAGNOSIS — R7309 Other abnormal glucose: Secondary | ICD-10-CM | POA: Diagnosis not present

## 2021-02-07 DIAGNOSIS — H547 Unspecified visual loss: Secondary | ICD-10-CM | POA: Diagnosis not present

## 2021-02-07 DIAGNOSIS — E039 Hypothyroidism, unspecified: Secondary | ICD-10-CM | POA: Diagnosis not present

## 2021-02-07 DIAGNOSIS — D6869 Other thrombophilia: Secondary | ICD-10-CM | POA: Diagnosis not present

## 2021-02-07 DIAGNOSIS — E7800 Pure hypercholesterolemia, unspecified: Secondary | ICD-10-CM | POA: Diagnosis not present

## 2021-02-07 DIAGNOSIS — G2581 Restless legs syndrome: Secondary | ICD-10-CM | POA: Diagnosis not present

## 2021-02-07 DIAGNOSIS — I482 Chronic atrial fibrillation, unspecified: Secondary | ICD-10-CM | POA: Diagnosis not present

## 2021-02-07 DIAGNOSIS — G4733 Obstructive sleep apnea (adult) (pediatric): Secondary | ICD-10-CM | POA: Diagnosis not present

## 2021-02-07 DIAGNOSIS — Z0000 Encounter for general adult medical examination without abnormal findings: Secondary | ICD-10-CM | POA: Diagnosis not present

## 2021-03-14 ENCOUNTER — Ambulatory Visit (INDEPENDENT_AMBULATORY_CARE_PROVIDER_SITE_OTHER): Payer: Medicare Other

## 2021-03-14 DIAGNOSIS — I495 Sick sinus syndrome: Secondary | ICD-10-CM | POA: Diagnosis not present

## 2021-03-14 LAB — CUP PACEART REMOTE DEVICE CHECK
Battery Remaining Longevity: 114 mo
Battery Remaining Percentage: 88 %
Battery Voltage: 3.02 V
Brady Statistic RV Percent Paced: 6.5 %
Date Time Interrogation Session: 20230308020014
Implantable Lead Implant Date: 20080804
Implantable Lead Location: 753860
Implantable Pulse Generator Implant Date: 20210309
Lead Channel Impedance Value: 340 Ohm
Lead Channel Pacing Threshold Amplitude: 1.25 V
Lead Channel Pacing Threshold Pulse Width: 0.6 ms
Lead Channel Sensing Intrinsic Amplitude: 4.2 mV
Lead Channel Setting Pacing Amplitude: 2.5 V
Lead Channel Setting Pacing Pulse Width: 0.6 ms
Lead Channel Setting Sensing Sensitivity: 2 mV
Pulse Gen Model: 1272
Pulse Gen Serial Number: 9153964

## 2021-03-27 NOTE — Progress Notes (Signed)
Remote pacemaker transmission.   

## 2021-06-13 ENCOUNTER — Ambulatory Visit (INDEPENDENT_AMBULATORY_CARE_PROVIDER_SITE_OTHER): Payer: Medicare Other

## 2021-06-13 DIAGNOSIS — I495 Sick sinus syndrome: Secondary | ICD-10-CM | POA: Diagnosis not present

## 2021-06-14 ENCOUNTER — Other Ambulatory Visit: Payer: Self-pay | Admitting: Cardiology

## 2021-06-14 DIAGNOSIS — I482 Chronic atrial fibrillation, unspecified: Secondary | ICD-10-CM

## 2021-06-14 LAB — CUP PACEART REMOTE DEVICE CHECK
Battery Remaining Longevity: 111 mo
Battery Remaining Percentage: 86 %
Battery Voltage: 3.02 V
Brady Statistic RV Percent Paced: 6.6 %
Date Time Interrogation Session: 20230607030844
Implantable Lead Implant Date: 20080804
Implantable Lead Location: 753860
Implantable Pulse Generator Implant Date: 20210309
Lead Channel Impedance Value: 310 Ohm
Lead Channel Pacing Threshold Amplitude: 1.25 V
Lead Channel Pacing Threshold Pulse Width: 0.6 ms
Lead Channel Sensing Intrinsic Amplitude: 3.7 mV
Lead Channel Setting Pacing Amplitude: 2.5 V
Lead Channel Setting Pacing Pulse Width: 0.6 ms
Lead Channel Setting Sensing Sensitivity: 2 mV
Pulse Gen Model: 1272
Pulse Gen Serial Number: 9153964

## 2021-06-14 NOTE — Telephone Encounter (Signed)
Xarelto '15mg'$  refill request received. Pt is 88 years old, weight-61.2kg, Crea-0.78 on 08/16/20, last seen by Dr. Fransico Him on 01/31/2021 via Telemedicine visit, Diagnosis-Afib, CrCl-45.31m/min; Dose is appropriate based on dosing criteria. Will send in refill to requested pharmacy.

## 2021-06-22 NOTE — Progress Notes (Signed)
Remote pacemaker transmission.   

## 2021-08-10 ENCOUNTER — Telehealth (INDEPENDENT_AMBULATORY_CARE_PROVIDER_SITE_OTHER): Payer: Medicare Other | Admitting: Internal Medicine

## 2021-08-10 ENCOUNTER — Encounter: Payer: Self-pay | Admitting: Internal Medicine

## 2021-08-10 VITALS — Ht 62.0 in | Wt 135.0 lb

## 2021-08-10 DIAGNOSIS — I441 Atrioventricular block, second degree: Secondary | ICD-10-CM | POA: Diagnosis not present

## 2021-08-10 DIAGNOSIS — D6869 Other thrombophilia: Secondary | ICD-10-CM | POA: Diagnosis not present

## 2021-08-10 DIAGNOSIS — I495 Sick sinus syndrome: Secondary | ICD-10-CM | POA: Diagnosis not present

## 2021-08-10 DIAGNOSIS — I10 Essential (primary) hypertension: Secondary | ICD-10-CM | POA: Diagnosis not present

## 2021-08-10 DIAGNOSIS — I482 Chronic atrial fibrillation, unspecified: Secondary | ICD-10-CM

## 2021-08-10 NOTE — Patient Instructions (Signed)
Medication Instructions:  Continue all current medications.  Labwork: none  Testing/Procedures: none  Follow-Up: 1 year   Any Other Special Instructions Will Be Listed Below (If Applicable).  If you need a refill on your cardiac medications before your next appointment, please call your pharmacy.  

## 2021-08-10 NOTE — Progress Notes (Signed)
Electrophysiology TeleHealth Note  Due to the patients advanced age and poor mobility, an audio telehealth visit is felt to be most appropriate for this patient at this time.  Verbal consent was obtained by me for the telehealth visit today.  The patient does not have capability for a virtual visit.  A phone visit is therefore required today.   Date:  08/10/2021   ID:  Anna Farrell, DOB [birth year], MRN [id number]  Location: patient's residence  Provider location:  [facility]  Evaluation Performed: Follow-up visit  PCP:  [name], MD   Electrophysiologist:  Dr Rayann Heman Cardiologist:  Dr. Radford Pax   Chief Complaint:  palpitations  History of Present Illness:    Anna Farrell is a 88 y.o. female who presents via telehealth conferencing today.  Her daughter assists with the visit today.  She recently went to the beach for 2 weeks.  She is not having any issues. Today, she denies symptoms of palpitations, chest pain, shortness of breath,  lower extremity edema, dizziness, presyncope, or syncope. She has chronic visual deficit. The patient is otherwise without complaint today.     Past Medical History:  Diagnosis Date   Asymptomatic PVCs    Cholelithiasis    Chronic low back pain 2008   crushed vertebrae   Colitis 5/13   Extrinsic asthma, unspecified    LOV Dr Joya Gaskins 7/12   Generalized osteoarthrosis, unspecified site    History of colonic polyps    Hyperlipidemia    Hypertension    Iron deficiency anemia    Long term (current) use of anticoagulants    Orthostatic hypotension    Osteoporosis    Pacemaker 10/11/06   implanted by Dr Leonia Reeves for pauses > 4 seconds with afib   Pancreatitis    Permanent atrial fibrillation ([place])    Tachycardia-bradycardia syndrome ([place])    Vasovagal syncope     Past Surgical History:  Procedure Laterality Date   ABDOMINAL HYSTERECTOMY     APPENDECTOMY     CARDIAC CATHETERIZATION  2009   Normal coronary arteries   CHOLECYSTECTOMY   09/17/2011   Procedure: LAPAROSCOPIC CHOLECYSTECTOMY;  Surgeon: Rolm Bookbinder, MD;  [facility];  Service: General;;   COLONOSCOPY     ERCP N/A 04/07/2013   Procedure: ENDOSCOPIC RETROGRADE CHOLANGIOPANCREATOGRAPHY (ERCP);  Surgeon: Jeryl Columbia, MD;  Location: [facility];  Service: Endoscopy;  Laterality: N/A;   EUS  04/07/2013   Procedure: UPPER ENDOSCOPIC ULTRASOUND (EUS) RADIAL;  Surgeon: Jeryl Columbia, MD;  [facility]: [facility];  Service: Endoscopy;;   HIP ARTHROPLASTY Left 08/17/2013   Procedure: ARTHROPLASTY BIPOLAR HIP;  Surgeon: Renette Butters, MD;  Location: [place];  Service: Orthopedics;  Laterality: Left;   PACEMAKER INSERTION  10/11/2006   SJM Zephyr XL DR implanted by Dr Leonia Reeves for afib with pause > 4 seconds   PPM GENERATOR CHANGEOUT N/A 03/16/2019   Procedure: PPM GENERATOR CHANGEOUT;  Surgeon: Thompson Grayer, MD;  Location: [place] CV [facility];  Service: Cardiovascular;  Laterality: N/A;   VESICOVAGINAL FISTULA CLOSURE W/ TAH  1981    Current Outpatient Medications  Medication Sig Dispense Refill   acetaminophen (TYLENOL) 325 MG tablet Take 650 mg by mouth every 6 (six) hours as needed.     albuterol (VENTOLIN HFA) 108 (90 Base) MCG/ACT inhaler Inhale 2 puffs into the lungs every 6 (six) hours as needed for wheezing or shortness of breath. Pro     ALPRAZolam (XANAX) 0.25 MG tablet Take 0.25-0.5  mg by mouth at bedtime.   0   amLODipine (NORVASC) 5 MG tablet TAKE 1 TABLET(5 MG) BY MOUTH DAILY 90 tablet 3   BREO ELLIPTA 100-25 MCG/INH AEPB Inhale 1 puff into the lungs daily as needed (Shortness of Breath).   1   famotidine (PEPCID) 20 MG tablet Take 20 mg by mouth as needed for heartburn or indigestion.     latanoprost (XALATAN) 0.005 % ophthalmic solution Place 1 drop into the right eye every evening.     lisinopril (ZESTRIL) 40 MG tablet Take 20 mg by mouth daily.     metoprolol succinate (TOPROL-XL) 100 MG 24 hr tablet Take 100 mg daily by mouth. Take with or  immediately following a meal.     Multiple Vitamins-Minerals (EYE VITAMINS PO) Take 1 tablet by mouth in the morning and at bedtime. preservision     nitroGLYCERIN (NITROSTAT) 0.4 MG SL tablet Place 0.4 mg under the tongue every 5 (five) minutes as needed for chest pain.     ondansetron (ZOFRAN) 8 MG tablet Take 8 mg by mouth daily as needed (Car sick).      pravastatin (PRAVACHOL) 40 MG tablet Take 1 tablet (40 mg total) by mouth daily. Once daily 90 tablet 1   Rivaroxaban (XARELTO) 15 MG TABS tablet TAKE 1 TABLET(15 MG) BY MOUTH DAILY WITH SUPPER 30 tablet 5   traMADol (ULTRAM) 50 MG tablet Take 100 mg by mouth 2 (two) times daily.     No current facility-administered medications for this visit.    Allergies:   Codeine, Betadine [povidone iodine], and Rocephin [ceftriaxone sodium in dextrose]   Social History:  The patient  reports that she has never smoked. She has never used smokeless tobacco. She reports that she does not drink alcohol and does not use drugs.   ROS:  Please see the history of present illness.   All other systems are personally reviewed and negative.    Exam:    Vital Signs:  Ht '5\' 2"'$  (1.575 m)   Wt 135 lb (61.2 kg)   BMI 24.69 kg/m   Well sounding, alert and conversant , daughter assists with the visit  Labs/Other Tests and Data Reviewed:    Recent Labs: 08/16/2020: ALT 9; BUN 17; Creatinine, Ser 0.78; Hemoglobin 12.3; Platelets 309; Potassium 4.6; Sodium 142   Wt Readings from Last 3 Encounters:  08/10/21 135 lb (61.2 kg)  01/31/21 135 lb (61.2 kg)  08/16/20 136 lb 9.6 oz (62 kg)     Last device remote is reviewed from [place] PDF which reveals normal device function,     ASSESSMENT & PLAN:    1.  Second degree AV block Normal device function by remotes V paces only 6%  2. Permanent afib Rate controlled On xarelto.  Labs 2/ 23 reviewed.  Xarelto dose has been adjusted due to CrCl.  3. HTN Stable No change required today  Follow-up:  with me  in a year   Patient Risk:  after full review of this patients clinical status, I feel that they are at moderate risk at this time.  Today, I have spent 15 minutes with the patient with telehealth technology discussing arrhythmia management .    SignedThompson Grayer, MD  08/10/2021 1:55 PM     [place] [address] [place] [address] [phone] (office) [phone] (fax)

## 2021-09-11 ENCOUNTER — Other Ambulatory Visit: Payer: Self-pay | Admitting: Cardiology

## 2021-09-12 ENCOUNTER — Ambulatory Visit (INDEPENDENT_AMBULATORY_CARE_PROVIDER_SITE_OTHER): Payer: Medicare Other

## 2021-09-12 DIAGNOSIS — I441 Atrioventricular block, second degree: Secondary | ICD-10-CM | POA: Diagnosis not present

## 2021-09-12 LAB — CUP PACEART REMOTE DEVICE CHECK
Battery Remaining Longevity: 110 mo
Battery Remaining Percentage: 85 %
Battery Voltage: 3.02 V
Brady Statistic RV Percent Paced: 6.8 %
Date Time Interrogation Session: 20230906035136
Implantable Lead Implant Date: 20080804
Implantable Lead Location: 753860
Implantable Pulse Generator Implant Date: 20210309
Lead Channel Impedance Value: 310 Ohm
Lead Channel Pacing Threshold Amplitude: 1.25 V
Lead Channel Pacing Threshold Pulse Width: 0.6 ms
Lead Channel Sensing Intrinsic Amplitude: 3.3 mV
Lead Channel Setting Pacing Amplitude: 2.5 V
Lead Channel Setting Pacing Pulse Width: 0.6 ms
Lead Channel Setting Sensing Sensitivity: 2 mV
Pulse Gen Model: 1272
Pulse Gen Serial Number: 9153964

## 2021-10-01 NOTE — Progress Notes (Signed)
Remote pacemaker transmission.   

## 2021-12-12 ENCOUNTER — Ambulatory Visit (INDEPENDENT_AMBULATORY_CARE_PROVIDER_SITE_OTHER): Payer: Medicare Other

## 2021-12-12 DIAGNOSIS — I441 Atrioventricular block, second degree: Secondary | ICD-10-CM | POA: Diagnosis not present

## 2021-12-14 LAB — CUP PACEART REMOTE DEVICE CHECK
Battery Remaining Longevity: 109 mo
Battery Remaining Percentage: 83 %
Battery Voltage: 3.02 V
Brady Statistic RV Percent Paced: 6.9 %
Date Time Interrogation Session: 20231208171016
Implantable Lead Connection Status: 753985
Implantable Lead Implant Date: 20080804
Implantable Lead Location: 753860
Implantable Pulse Generator Implant Date: 20210309
Lead Channel Impedance Value: 340 Ohm
Lead Channel Pacing Threshold Amplitude: 1.25 V
Lead Channel Pacing Threshold Pulse Width: 0.6 ms
Lead Channel Sensing Intrinsic Amplitude: 3.1 mV
Lead Channel Setting Pacing Amplitude: 2.5 V
Lead Channel Setting Pacing Pulse Width: 0.6 ms
Lead Channel Setting Sensing Sensitivity: 2 mV
Pulse Gen Model: 1272
Pulse Gen Serial Number: 9153964

## 2022-01-04 NOTE — Progress Notes (Signed)
Remote pacemaker transmission.   

## 2022-01-08 ENCOUNTER — Emergency Department (HOSPITAL_BASED_OUTPATIENT_CLINIC_OR_DEPARTMENT_OTHER): Payer: Medicare Other

## 2022-01-08 ENCOUNTER — Emergency Department (HOSPITAL_BASED_OUTPATIENT_CLINIC_OR_DEPARTMENT_OTHER): Payer: Medicare Other | Admitting: Radiology

## 2022-01-08 ENCOUNTER — Other Ambulatory Visit: Payer: Self-pay

## 2022-01-08 ENCOUNTER — Encounter (HOSPITAL_BASED_OUTPATIENT_CLINIC_OR_DEPARTMENT_OTHER): Payer: Self-pay

## 2022-01-08 ENCOUNTER — Encounter (HOSPITAL_COMMUNITY): Payer: Self-pay

## 2022-01-08 ENCOUNTER — Inpatient Hospital Stay (HOSPITAL_BASED_OUTPATIENT_CLINIC_OR_DEPARTMENT_OTHER)
Admission: EM | Admit: 2022-01-08 | Discharge: 2022-01-14 | DRG: 193 | Disposition: A | Discharge status: Home Health | Payer: Medicare Other | Attending: Internal Medicine | Admitting: Internal Medicine

## 2022-01-08 DIAGNOSIS — R0602 Shortness of breath: Secondary | ICD-10-CM | POA: Diagnosis not present

## 2022-01-08 DIAGNOSIS — I5032 Chronic diastolic (congestive) heart failure: Secondary | ICD-10-CM | POA: Diagnosis present

## 2022-01-08 DIAGNOSIS — J189 Pneumonia, unspecified organism: Secondary | ICD-10-CM | POA: Diagnosis present

## 2022-01-08 DIAGNOSIS — Z66 Do not resuscitate: Secondary | ICD-10-CM | POA: Diagnosis present

## 2022-01-08 DIAGNOSIS — N390 Urinary tract infection, site not specified: Secondary | ICD-10-CM | POA: Diagnosis present

## 2022-01-08 DIAGNOSIS — Z7901 Long term (current) use of anticoagulants: Secondary | ICD-10-CM

## 2022-01-08 DIAGNOSIS — K219 Gastro-esophageal reflux disease without esophagitis: Secondary | ICD-10-CM | POA: Diagnosis present

## 2022-01-08 DIAGNOSIS — J45901 Unspecified asthma with (acute) exacerbation: Secondary | ICD-10-CM | POA: Diagnosis present

## 2022-01-08 DIAGNOSIS — Z1152 Encounter for screening for COVID-19: Secondary | ICD-10-CM | POA: Diagnosis not present

## 2022-01-08 DIAGNOSIS — Z96642 Presence of left artificial hip joint: Secondary | ICD-10-CM | POA: Diagnosis present

## 2022-01-08 DIAGNOSIS — Z885 Allergy status to narcotic agent status: Secondary | ICD-10-CM

## 2022-01-08 DIAGNOSIS — Z8249 Family history of ischemic heart disease and other diseases of the circulatory system: Secondary | ICD-10-CM

## 2022-01-08 DIAGNOSIS — G4733 Obstructive sleep apnea (adult) (pediatric): Secondary | ICD-10-CM | POA: Diagnosis present

## 2022-01-08 DIAGNOSIS — N3000 Acute cystitis without hematuria: Secondary | ICD-10-CM | POA: Diagnosis present

## 2022-01-08 DIAGNOSIS — N179 Acute kidney failure, unspecified: Secondary | ICD-10-CM | POA: Insufficient documentation

## 2022-01-08 DIAGNOSIS — R059 Cough, unspecified: Secondary | ICD-10-CM | POA: Diagnosis not present

## 2022-01-08 DIAGNOSIS — J209 Acute bronchitis, unspecified: Secondary | ICD-10-CM | POA: Diagnosis not present

## 2022-01-08 DIAGNOSIS — Z881 Allergy status to other antibiotic agents status: Secondary | ICD-10-CM

## 2022-01-08 DIAGNOSIS — E785 Hyperlipidemia, unspecified: Secondary | ICD-10-CM | POA: Diagnosis present

## 2022-01-08 DIAGNOSIS — J9601 Acute respiratory failure with hypoxia: Secondary | ICD-10-CM | POA: Diagnosis not present

## 2022-01-08 DIAGNOSIS — Z950 Presence of cardiac pacemaker: Secondary | ICD-10-CM | POA: Diagnosis not present

## 2022-01-08 DIAGNOSIS — I5031 Acute diastolic (congestive) heart failure: Secondary | ICD-10-CM | POA: Diagnosis not present

## 2022-01-08 DIAGNOSIS — E876 Hypokalemia: Secondary | ICD-10-CM | POA: Diagnosis present

## 2022-01-08 DIAGNOSIS — I495 Sick sinus syndrome: Secondary | ICD-10-CM | POA: Diagnosis present

## 2022-01-08 DIAGNOSIS — F419 Anxiety disorder, unspecified: Secondary | ICD-10-CM | POA: Diagnosis present

## 2022-01-08 DIAGNOSIS — I4821 Permanent atrial fibrillation: Secondary | ICD-10-CM | POA: Diagnosis present

## 2022-01-08 DIAGNOSIS — I110 Hypertensive heart disease with heart failure: Secondary | ICD-10-CM | POA: Diagnosis present

## 2022-01-08 DIAGNOSIS — M810 Age-related osteoporosis without current pathological fracture: Secondary | ICD-10-CM | POA: Diagnosis present

## 2022-01-08 DIAGNOSIS — G2581 Restless legs syndrome: Secondary | ICD-10-CM | POA: Diagnosis present

## 2022-01-08 DIAGNOSIS — R911 Solitary pulmonary nodule: Secondary | ICD-10-CM | POA: Diagnosis not present

## 2022-01-08 DIAGNOSIS — G8929 Other chronic pain: Secondary | ICD-10-CM | POA: Diagnosis present

## 2022-01-08 DIAGNOSIS — Z79899 Other long term (current) drug therapy: Secondary | ICD-10-CM

## 2022-01-08 DIAGNOSIS — R918 Other nonspecific abnormal finding of lung field: Secondary | ICD-10-CM

## 2022-01-08 DIAGNOSIS — D649 Anemia, unspecified: Secondary | ICD-10-CM | POA: Insufficient documentation

## 2022-01-08 DIAGNOSIS — E039 Hypothyroidism, unspecified: Secondary | ICD-10-CM | POA: Diagnosis present

## 2022-01-08 DIAGNOSIS — Z87440 Personal history of urinary (tract) infections: Secondary | ICD-10-CM | POA: Diagnosis not present

## 2022-01-08 DIAGNOSIS — D638 Anemia in other chronic diseases classified elsewhere: Secondary | ICD-10-CM | POA: Diagnosis present

## 2022-01-08 DIAGNOSIS — Z809 Family history of malignant neoplasm, unspecified: Secondary | ICD-10-CM

## 2022-01-08 DIAGNOSIS — Z888 Allergy status to other drugs, medicaments and biological substances status: Secondary | ICD-10-CM

## 2022-01-08 DIAGNOSIS — Z7951 Long term (current) use of inhaled steroids: Secondary | ICD-10-CM

## 2022-01-08 DIAGNOSIS — R062 Wheezing: Secondary | ICD-10-CM | POA: Diagnosis not present

## 2022-01-08 LAB — URINALYSIS, ROUTINE W REFLEX MICROSCOPIC
Bacteria, UA: NONE SEEN
Bilirubin Urine: NEGATIVE
Glucose, UA: NEGATIVE mg/dL
Hgb urine dipstick: NEGATIVE
Ketones, ur: NEGATIVE mg/dL
Nitrite: NEGATIVE
Protein, ur: 100 mg/dL — AB
Specific Gravity, Urine: 1.043 — ABNORMAL HIGH (ref 1.005–1.030)
WBC, UA: >50 WBC/hpf — ABNORMAL HIGH (ref 0–5)
pH: 8.5 — ABNORMAL HIGH (ref 5.0–8.0)

## 2022-01-08 LAB — RESP PANEL BY RT-PCR (RSV, FLU A&B, COVID)  RVPGX2
Influenza A by PCR: NEGATIVE
Influenza B by PCR: NEGATIVE
Resp Syncytial Virus by PCR: NEGATIVE
SARS Coronavirus 2 by RT PCR: NEGATIVE

## 2022-01-08 LAB — COMPREHENSIVE METABOLIC PANEL
ALT: 7 U/L (ref 0–44)
AST: 15 U/L (ref 15–41)
Albumin: 4 g/dL (ref 3.5–5.0)
Alkaline Phosphatase: 74 U/L (ref 38–126)
Anion gap: 13 (ref 5–15)
BUN: 13 mg/dL (ref 8–23)
CO2: 29 mmol/L (ref 22–32)
Calcium: 9.7 mg/dL (ref 8.9–10.3)
Chloride: 98 mmol/L (ref 98–111)
Creatinine, Ser: 0.85 mg/dL (ref 0.44–1.00)
GFR, Estimated: >60 mL/min (ref >60)
Glucose, Bld: 197 mg/dL — ABNORMAL HIGH (ref 70–99)
Potassium: 3.4 mmol/L — ABNORMAL LOW (ref 3.5–5.1)
Sodium: 140 mmol/L (ref 135–145)
Total Bilirubin: 0.9 mg/dL (ref 0.3–1.2)
Total Protein: 7 g/dL (ref 6.5–8.1)

## 2022-01-08 LAB — CBC
HCT: 39.2 % (ref 36.0–46.0)
Hemoglobin: 11.7 g/dL — ABNORMAL LOW (ref 12.0–15.0)
MCH: 24.1 pg — ABNORMAL LOW (ref 26.0–34.0)
MCHC: 29.8 g/dL — ABNORMAL LOW (ref 30.0–36.0)
MCV: 80.7 fL (ref 80.0–100.0)
Platelets: 355 10*3/uL (ref 150–400)
RBC: 4.86 MIL/uL (ref 3.87–5.11)
RDW: 17 % — ABNORMAL HIGH (ref 11.5–15.5)
WBC: 17.7 10*3/uL — ABNORMAL HIGH (ref 4.0–10.5)
nRBC: 0 % (ref 0.0–0.2)

## 2022-01-08 LAB — BRAIN NATRIURETIC PEPTIDE: B Natriuretic Peptide: 419.4 pg/mL — ABNORMAL HIGH (ref 0.0–100.0)

## 2022-01-08 LAB — TROPONIN I (HIGH SENSITIVITY)
Troponin I (High Sensitivity): 6 ng/L (ref <18)
Troponin I (High Sensitivity): 8 ng/L (ref <18)

## 2022-01-08 MED ORDER — LISINOPRIL 20 MG PO TABS: 20.0000 mg | ORAL_TABLET | Freq: Every day | ORAL | Status: DC

## 2022-01-08 MED ORDER — RIVAROXABAN 15 MG PO TABS
15.0000 mg | ORAL_TABLET | Freq: Every day | ORAL | Status: DC
  Administered 2022-01-09 – 2022-01-14 (×6): 15 mg via ORAL
  Filled 2022-01-08 (×6): qty 1

## 2022-01-08 MED ORDER — POTASSIUM CHLORIDE CRYS ER 20 MEQ PO TBCR
20.0000 meq | EXTENDED_RELEASE_TABLET | Freq: Once | ORAL | Status: DC
  Filled 2022-01-08: qty 1

## 2022-01-08 MED ORDER — IOHEXOL 350 MG/ML SOLN
100.0000 mL | Freq: Once | INTRAVENOUS | Status: AC | PRN
  Administered 2022-01-08: 75 mL via INTRAVENOUS

## 2022-01-08 MED ORDER — LEVOFLOXACIN IN D5W 750 MG/150ML IV SOLN
750.0000 mg | Freq: Once | INTRAVENOUS | Status: AC
  Administered 2022-01-08: 750 mg via INTRAVENOUS
  Filled 2022-01-08: qty 150

## 2022-01-08 MED ORDER — LATANOPROST 0.005 % OP SOLN
1.0000 [drp] | Freq: Every evening | OPHTHALMIC | Status: DC
  Administered 2022-01-09 – 2022-01-14 (×6): 1 [drp] via OPHTHALMIC
  Filled 2022-01-08 (×2): qty 2.5

## 2022-01-08 MED ORDER — FLUTICASONE FUROATE-VILANTEROL 100-25 MCG/ACT IN AEPB
1.0000 | INHALATION_SPRAY | Freq: Every day | RESPIRATORY_TRACT | Status: DC | PRN
  Filled 2022-01-08 (×2): qty 28

## 2022-01-08 MED ORDER — POTASSIUM CHLORIDE 20 MEQ PO PACK
20.0000 meq | PACK | Freq: Once | ORAL | Status: AC
  Administered 2022-01-08: 20 meq via ORAL
  Filled 2022-01-08: qty 1

## 2022-01-08 MED ORDER — SODIUM CHLORIDE 0.9 % IV BOLUS: 1000.0000 mL | Freq: Once | INTRAVENOUS | Status: DC

## 2022-01-08 MED ORDER — LACTATED RINGERS IV BOLUS
500.0000 mL | Freq: Once | INTRAVENOUS | Status: AC
  Administered 2022-01-08: 500 mL via INTRAVENOUS

## 2022-01-08 MED ORDER — FAMOTIDINE 20 MG PO TABS: 20.0000 mg | ORAL_TABLET | ORAL | Status: DC | PRN

## 2022-01-08 MED ORDER — METOPROLOL SUCCINATE ER 100 MG PO TB24: 100.0000 mg | ORAL_TABLET | Freq: Every day | ORAL | Status: DC

## 2022-01-08 MED ORDER — ALPRAZOLAM 0.5 MG PO TABS
0.2500 mg | ORAL_TABLET | Freq: Every day | ORAL | Status: DC
  Administered 2022-01-08: 0.25 mg via ORAL
  Filled 2022-01-08: qty 1

## 2022-01-08 MED ORDER — ALBUTEROL SULFATE HFA 108 (90 BASE) MCG/ACT IN AERS
2.0000 | INHALATION_SPRAY | Freq: Four times a day (QID) | RESPIRATORY_TRACT | Status: DC | PRN
  Filled 2022-01-08: qty 6.7

## 2022-01-08 MED ORDER — PREDNISONE 10 MG PO TABS: 40.0000 mg | ORAL_TABLET | Freq: Every day | ORAL | 0 refills | Status: DC

## 2022-01-08 MED ORDER — BENZONATATE 100 MG PO CAPS: 100.0000 mg | ORAL_CAPSULE | Freq: Three times a day (TID) | ORAL | 0 refills | Status: DC

## 2022-01-08 MED ORDER — LEVOFLOXACIN 250 MG PO TABS: 250.0000 mg | ORAL_TABLET | Freq: Every day | ORAL | 0 refills | Status: DC

## 2022-01-08 MED ORDER — AMLODIPINE BESYLATE 5 MG PO TABS: 5.0000 mg | ORAL_TABLET | Freq: Every day | ORAL | Status: DC

## 2022-01-08 NOTE — ED Triage Notes (Signed)
Patient BIB [facility] from Home.  [facility] states Patient has had Cough and Low Grade Fever for approximately 3 Weeks. Symptoms have remained Consistent since. Mild Expiratory Wheezing noted by EMS in [place].    VSS with EMS en Route but had difficulty obtaining accurate SPO2. 91% was what was obtained.

## 2022-01-08 NOTE — ED Notes (Signed)
Pt is aware of the need for a urine... Pt unable to at this time.

## 2022-01-08 NOTE — ED Triage Notes (Signed)
Patient BIB [facility] from Home.  Endorses Cough for approximately 3 Weeks associated with Low Grade Fevers. EMS was contacted today as she feels progressively worse along with some SOB today.  Family also states she has been having some Dysuria recently.   NAD Noted during Triage. A&Ox4. GCS 15. BIB Wheelchair.

## 2022-01-08 NOTE — Progress Notes (Signed)
Plan of Care Note for accepted transfer   Patient: LYNETT BRASIL MRN: [id number]   [place]: 01/08/2022  Facility requesting transfer: [facility]. Requesting Provider: Dr. Armandina Gemma, [place]. Reason for transfer: Acute bronchitis, presumptive UTI.  Facility course: The patient is a 89 year old female with past medical history significant for mild persistent asthma, GERD, generalized anxiety disorder, chronic atrial fibrillation on Xarelto, OSA, restless leg syndrome, hypothyroidism, hyperlipidemia, who presented to [facility] ED with complaints of 3 weeks of upper respiratory symptoms.  In the ED, tachycardic, tachypneic and hypoxic with O2 saturation in the 80s, improved on 2 L with O2 saturation in the 90s.  Last 2D echo with normal LVEF.  Chest x-ray with no evidence of acute cardiopulmonary disease.  UA positive for pyuria.  The patient was started on IV Levaquin empirically.  Additionally, she received 500 cc IV fluid bolus LR x 1 and KCl replacement.  The patient was admitted at [facility] telemetry medical unit as inpatient status.  Plan of care: The patient is accepted for admission to Telemetry unit, at [facility] as inpatient status.  Author: Kayleen Memos, DO 01/08/2022  Check [email or web address] for on-call coverage.  Nursing staff, Please call [place] number on Amion as soon as patient's arrival, so appropriate admitting provider can evaluate the pt.

## 2022-01-08 NOTE — ED Notes (Signed)
Pt placed on O2... Pts SpO2 was in the high 80's while sleeping... Pt placed on 2LPM... RT notified.Marland KitchenMarland Kitchen

## 2022-01-08 NOTE — Discharge Instructions (Addendum)
We will treat you for acute bronchitis, UTI and concern for developing community-acquired pneumonia.  Please follow-up with your PCP in a few days to ensure resolution.  If symptoms are not improving, please return to the emergency department for further evaluation and consideration for inpatient admission.  Your CT scan revealed an incidental finding of pulmonary nodules which may warrant outpatient surveillance: IMPRESSION:  No pulmonary embolism identified.    Subtle lung base parenchymal opacities. Acute infiltrates possible.  Recommend follow-up.    Few tiny lung nodules are seen measuring up to 5 mm. No follow-up  needed if patient is low-risk (and has no known or suspected primary  neoplasm). Non-contrast chest CT can be considered in 12 months if  patient is high-risk. This recommendation follows the consensus  statement: Guidelines for Management of Incidental Pulmonary Nodules  Detected on CT Images: From the Fleischner Society 2017; Radiology  2017; 284:228-243.    Moderate hiatal hernia.    Previous cholecystectomy. There is a dilated common duct. This has  been present since at least 2016.

## 2022-01-08 NOTE — ED Provider Notes (Addendum)
[facility] Provider Note   CSN: [id number] Arrival date & time: 01/08/22  1056     History  Chief Complaint  Patient presents with   Cough    SAVHANNA Farrell is a 89 y.o. female.   Cough    89 year old female with medical history significant for permanent atrial fibrillation, HTN, HLD, tachybradycardia syndrome, a pacemaker in place, presents emerged part with a cough for roughly 3 weeks with low-grade fevers.  She has had mild dyspnea today associated with a cough.  She is also been having dysuria recently.  No altered mental status.  She arrives GCS 15, AAO x 4.  She denies any chest pain.  She denies any episodes of syncope.  She endorses a productive cough and mild dyspnea.  Home Medications Prior to Admission medications   Medication Sig Start Date End Date Taking? Authorizing Provider  benzonatate (TESSALON) 100 MG capsule Take 1 capsule (100 mg total) by mouth every 8 (eight) hours. 01/08/22  Yes Regan Lemming, MD  levofloxacin (LEVAQUIN) 250 MG tablet Take 1 tablet (250 mg total) by mouth daily for 5 days. 01/08/22 01/13/22 Yes Regan Lemming, MD  predniSONE (DELTASONE) 10 MG tablet Take 4 tablets (40 mg total) by mouth daily for 5 days. 01/08/22 01/13/22 Yes Regan Lemming, MD  acetaminophen (TYLENOL) 325 MG tablet Take 650 mg by mouth every 6 (six) hours as needed.    [provider]  albuterol (VENTOLIN HFA) 108 (90 Base) MCG/ACT inhaler Inhale 2 puffs into the lungs every 6 (six) hours as needed for wheezing or shortness of breath. Pro    [provider]  ALPRAZolam (XANAX) 0.25 MG tablet Take 0.25-0.5 mg by mouth at bedtime.  08/05/16   [provider]  amLODipine (NORVASC) 5 MG tablet TAKE 1 TABLET(5 MG) BY MOUTH DAILY 09/12/21   Turner, Eber Hong, MD  BREO ELLIPTA 100-25 MCG/INH AEPB Inhale 1 puff into the lungs daily as needed (Shortness of Breath).  07/16/16   [provider]  famotidine (PEPCID) 20 MG tablet Take 20  mg by mouth as needed for heartburn or indigestion.    [provider]  latanoprost (XALATAN) 0.005 % ophthalmic solution Place 1 drop into the right eye every evening.    [provider]  lisinopril (ZESTRIL) 40 MG tablet Take 20 mg by mouth daily. 02/26/19   [provider]  metoprolol succinate (TOPROL-XL) 100 MG 24 hr tablet Take 100 mg daily by mouth. Take with or immediately following a meal.    [provider]  Multiple Vitamins-Minerals (EYE VITAMINS PO) Take 1 tablet by mouth in the morning and at bedtime. preservision    [provider]  nitroGLYCERIN (NITROSTAT) 0.4 MG SL tablet Place 0.4 mg under the tongue every 5 (five) minutes as needed for chest pain.    [provider]  ondansetron (ZOFRAN) 8 MG tablet Take 8 mg by mouth daily as needed (Car sick).  03/04/19   [provider]  pravastatin (PRAVACHOL) 40 MG tablet Take 1 tablet (40 mg total) by mouth daily. Once daily 07/08/13   Sueanne Margarita, MD  Rivaroxaban (XARELTO) 15 MG TABS tablet TAKE 1 TABLET(15 MG) BY MOUTH DAILY WITH SUPPER 06/14/21   Sueanne Margarita, MD  traMADol (ULTRAM) 50 MG tablet Take 100 mg by mouth 2 (two) times daily.    [provider]      Allergies    Codeine, Betadine [povidone iodine], and Rocephin [ceftriaxone sodium in dextrose]  Review of Systems   Review of Systems  Respiratory:  Positive for cough.   Genitourinary:  Positive for dysuria.  All other systems reviewed and are negative.   Physical Exam Updated Vital Signs BP 111/66 (BP Location: Right Arm)   Pulse 85   Temp 98.7 F (37.1 C) (Oral)   Resp 18   Ht '5\' 2"'$  (1.575 m)   Wt 61.2 kg   SpO2 90%   BMI 24.68 kg/m  Physical Exam Vitals and nursing note reviewed.  Constitutional:      General: She is not in acute distress.    Appearance: She is well-developed.  HENT:     Head: Normocephalic and atraumatic.  Eyes:     Conjunctiva/sclera: Conjunctivae normal.   Cardiovascular:     Rate and Rhythm: Normal rate and regular rhythm.  Pulmonary:     Effort: Pulmonary effort is normal. No respiratory distress.     Breath sounds: Normal breath sounds.  Abdominal:     Palpations: Abdomen is soft.     Tenderness: There is no abdominal tenderness.  Musculoskeletal:        General: No swelling.     Cervical back: Neck supple.  Skin:    General: Skin is warm and dry.     Capillary Refill: Capillary refill takes less than 2 seconds.  Neurological:     General: No focal deficit present.     Mental Status: She is alert and oriented to person, place, and time.     Cranial Nerves: No cranial nerve deficit.     Sensory: No sensory deficit.     Motor: No weakness.  Psychiatric:        Mood and Affect: Mood normal.     ED Results / Procedures / Treatments   Labs (all labs ordered are listed, but only abnormal results are displayed) Labs Reviewed  COMPREHENSIVE METABOLIC PANEL - Abnormal; Notable for the following components:      Result Value   Potassium 3.4 (*)    Glucose, Bld 197 (*)    All other components within normal limits  CBC - Abnormal; Notable for the following components:   WBC 17.7 (*)    Hemoglobin 11.7 (*)    MCH 24.1 (*)    MCHC 29.8 (*)    RDW 17.0 (*)    All other components within normal limits  URINALYSIS, ROUTINE W REFLEX MICROSCOPIC - Abnormal; Notable for the following components:   APPearance HAZY (*)    Specific Gravity, Urine 1.043 (*)    pH 8.5 (*)    Protein, ur 100 (*)    Leukocytes,Ua MODERATE (*)    WBC, UA >50 (*)    Non Squamous Epithelial 0-5 (*)    All other components within normal limits  BRAIN NATRIURETIC PEPTIDE - Abnormal; Notable for the following components:   B Natriuretic Peptide 419.4 (*)    All other components within normal limits  RESP PANEL BY RT-PCR (RSV, FLU A&B, COVID)  RVPGX2  TROPONIN I (HIGH SENSITIVITY)  TROPONIN I (HIGH SENSITIVITY)    EKG EKG  Interpretation  Date/Time:  Tuesday January 08 2022 12:14:34 EST Ventricular Rate:  97 PR Interval:    QRS Duration: 86 QT Interval:  356 QTC Calculation: 452 R Axis:   -5 Text Interpretation: Atrial fibrillation Low voltage QRS Cannot rule out Anterior infarct , age undetermined ST & T wave abnormality, consider inferolateral ischemia Abnormal ECG When compared with ECG of 28-Jul-2020 05:24, Significant changes have occurred  Reconfirmed by Regan Lemming [phone]) on 01/08/2022 6:44:49 PM  Radiology CT Angio Chest PE W and/or Wo Contrast  Result Date: 01/08/2022 CLINICAL DATA:  Hypomobility clinical suspicion for pulmonary embolism. Shortness of breath. EXAM: CT ANGIOGRAPHY CHEST WITH CONTRAST TECHNIQUE: Multidetector CT imaging of the chest was performed using the standard protocol during bolus administration of intravenous contrast. Multiplanar CT image reconstructions and MIPs were obtained to evaluate the vascular anatomy. RADIATION DOSE REDUCTION: This exam was performed according to the departmental dose-optimization program which includes automated exposure control, adjustment of the mA and/or kV according to patient size and/or use of iterative reconstruction technique. CONTRAST:  36m OMNIPAQUE IOHEXOL 350 MG/ML SOLN COMPARISON:  Chest x-ray 01/08/2022 and older. Old CT scan 2009 abdomen and pelvis CT 01/30/2014 FINDINGS: Cardiovascular: No segmental or larger pulmonary embolism identified. No abnormal enlargement of the main pulmonary artery. The thoracic aorta has normal caliber. There is a tortuous course of the descending thoracic aorta. Scattered vascular calcifications are seen. Coronary artery calcifications are also noted. Left upper chest battery pack with leads extending along the right side of the heart. Pacemaker. Mediastinum/Nodes: Nonspecific abnormal lymph node enlargement identified in the axillary region, hilum and mediastinum. Moderate small hiatal hernia. Lungs/Pleura: Mild  breathing motion. There is some patchy opacity seen with some ground-glass components in both lower lobes. There are areas of bronchiectasis identified along the lingula with some opacity along the area of bronchiectasis. Additional smaller nodules are seen such as middle lobe on axial image 72 measuring 5 mm. Additional small foci under 5 mm in lingula inferiorly. No pneumothorax or effusion. Upper Abdomen: Previous cholecystectomy. Adrenal glands are preserved. Dilated common duct. This has been present since at least 2016 9 CT scan. Musculoskeletal: Scattered degenerative changes along the spine. There is moderate compression deformity L1 with augmentation cement. Slight kyphosis. Review of the MIP images confirms the above findings. IMPRESSION: No pulmonary embolism identified. Subtle lung base parenchymal opacities. Acute infiltrates possible. Recommend follow-up. Few tiny lung nodules are seen measuring up to 5 mm. No follow-up needed if patient is low-risk (and has no known or suspected primary neoplasm). Non-contrast chest CT can be considered in 12 months if patient is high-risk. This recommendation follows the consensus statement: Guidelines for Management of Incidental Pulmonary Nodules Detected on CT Images: From the Fleischner Society 2017; Radiology 2017; 284:228-243. Moderate hiatal hernia. Previous cholecystectomy. There is a dilated common duct. This has been present since at least 2016. Electronically Signed   By: AJill SideM.D.   On: 01/08/2022 18:08   DG Chest 2 View  Result Date: 01/08/2022 CLINICAL DATA:  Shortness of breath EXAM: CHEST - 2 VIEW COMPARISON:  Radiographs 07/27/2020 FINDINGS: Unchanged dual chamber pacemaker leads. Unchanged cardiomediastinal silhouette. There is no focal airspace consolidation. There is no pleural effusion or evidence of pneumothorax. Prior L1 vertebroplasty. No acute osseous abnormality. Thoracic spondylosis. IMPRESSION: No evidence of acute  cardiopulmonary disease. Electronically Signed   By: JMaurine SimmeringM.D.   On: 01/08/2022 12:44    Procedures Procedures    Medications Ordered in ED Medications  levofloxacin (LEVAQUIN) IVPB 750 mg (has no administration in time range)  lactated ringers bolus 500 mL (0 mLs Intravenous Stopped 01/08/22 1944)  iohexol (OMNIPAQUE) 350 MG/ML injection 100 mL (75 mLs Intravenous Contrast Given 01/08/22 1740)  potassium chloride (KLOR-CON) packet 20 mEq (20 mEq Oral Given 01/08/22 2010)    ED Course/ Medical Decision Making/ A&P  Medical Decision Making Amount and/or Complexity of Data Reviewed Labs: ordered. Radiology: ordered.  Risk Prescription drug management. Decision regarding hospitalization.    89 year old female with medical history significant for permanent atrial fibrillation, HTN, HLD, tachybradycardia syndrome, a pacemaker in place, presents emerged part with a cough for roughly 3 weeks with low-grade fevers.  She has had mild dyspnea today associated with a cough.  She is also been having dysuria recently.  No altered mental status.  She arrives GCS 15, AAO x 4.  She denies any chest pain.  She denies any episodes of syncope.  She Dors is a productive cough and mild dyspnea.  On arrival, the patient was afebrile, not tachycardic or tachypneic, saturating well on room air.  Sinus rhythm noted on cardiac telemetry.  Patient differential diagnosis includes viral URI, COVID-19, influenza, developing bacterial pneumonia, PE.  An EKG was performed which revealed a apparent underlying atrial fibrillation, rate controlled, nonspecific ST changes in the inferior lateral leads.   Patient denies any chest pain at this time.  Laboratory evaluation significant for troponin negative, repeat also normal.   CBC positive for leukocytosis to 17.7, mild stable anemia to 11.7, urinalysis with evidence of UTI with moderate leukocytes, greater than 50 WBCs.  COVID-19, influenza, RSV  PCR testing was collected and resulted negative.  CMP revealed mild hypokalemia to 3.4, hyperglycemia 197, otherwise was unremarkable and a BNP was nonspecifically elevated at 419.  Patient was administered a 500 cc IV fluid bolus.  A chest x-ray revealed no acute cardiac or pulmonary abnormality.  A CT angio PE revealed the following: IMPRESSION:  No pulmonary embolism identified.    Subtle lung base parenchymal opacities. Acute infiltrates possible.  Recommend follow-up.    Few tiny lung nodules are seen measuring up to 5 mm. No follow-up  needed if patient is low-risk (and has no known or suspected primary  neoplasm). Non-contrast chest CT can be considered in 12 months if  patient is high-risk. This recommendation follows the consensus  statement: Guidelines for Management of Incidental Pulmonary Nodules  Detected on CT Images: From the Fleischner Society 2017; Radiology  2017; 284:228-243.    Moderate hiatal hernia.    Previous cholecystectomy. There is a dilated common duct. This has  been present since at least 2016.    Patient symptoms are concerning for acute bronchitis with subsequently likely developing bacterial pneumonia given the patient's leukocytosis and infiltrate seen on the patient's lung bases.  Will treat with a course of antibiotics for both UTI and developing pneumonia.  Considered admission for observation and offered this to the patient, after consideration, the patient would prefer to attempt outpatient management at this time.  Her PORT score is currently an 23.  Is overall well-appearing, vitally stable on repeat assessment.    Unfortunately on further repeat assessment, the patient became fairly tachypneic to the high 30s.  She also occasionally would desat down to the mid to high 80s.  She was subsequently placed on 2 L O2 via nasal cannula.  Due to concern for acute hypoxic respiratory failure in the setting of a bronchitis and likely developing bacterial  pneumonia, I did recommend admission for observation and IV antibiotics and continued oxygenation by nasal cannula.  Hospitalist medicine consulted for admission, Dr. Nevada Crane accepting.   Final Clinical Impression(s) / ED Diagnoses Final diagnoses:  Acute bronchitis, unspecified organism  Acute cystitis without hematuria  Pulmonary nodules  Acute respiratory failure with hypoxia ([place])    Rx / DC Orders  ED Discharge Orders          Ordered    predniSONE (DELTASONE) 10 MG tablet  Daily        01/08/22 1911    benzonatate (TESSALON) 100 MG capsule  Every 8 hours        01/08/22 1911    levofloxacin (LEVAQUIN) 250 MG tablet  Daily        01/08/22 1911              Regan Lemming, MD 01/08/22 Remonia Richter    Regan Lemming, MD 01/08/22 2122    Regan Lemming, MD 01/08/22 2213

## 2022-01-09 DIAGNOSIS — J45901 Unspecified asthma with (acute) exacerbation: Secondary | ICD-10-CM | POA: Insufficient documentation

## 2022-01-09 DIAGNOSIS — I5031 Acute diastolic (congestive) heart failure: Secondary | ICD-10-CM | POA: Diagnosis not present

## 2022-01-09 DIAGNOSIS — I5032 Chronic diastolic (congestive) heart failure: Secondary | ICD-10-CM | POA: Diagnosis present

## 2022-01-09 DIAGNOSIS — D649 Anemia, unspecified: Secondary | ICD-10-CM | POA: Insufficient documentation

## 2022-01-09 DIAGNOSIS — Z87440 Personal history of urinary (tract) infections: Secondary | ICD-10-CM | POA: Diagnosis not present

## 2022-01-09 DIAGNOSIS — Z950 Presence of cardiac pacemaker: Secondary | ICD-10-CM | POA: Diagnosis not present

## 2022-01-09 DIAGNOSIS — G2581 Restless legs syndrome: Secondary | ICD-10-CM | POA: Diagnosis present

## 2022-01-09 DIAGNOSIS — I110 Hypertensive heart disease with heart failure: Secondary | ICD-10-CM | POA: Diagnosis present

## 2022-01-09 DIAGNOSIS — Z66 Do not resuscitate: Secondary | ICD-10-CM | POA: Diagnosis present

## 2022-01-09 DIAGNOSIS — E876 Hypokalemia: Secondary | ICD-10-CM | POA: Diagnosis present

## 2022-01-09 DIAGNOSIS — J209 Acute bronchitis, unspecified: Secondary | ICD-10-CM | POA: Diagnosis present

## 2022-01-09 DIAGNOSIS — E785 Hyperlipidemia, unspecified: Secondary | ICD-10-CM | POA: Diagnosis present

## 2022-01-09 DIAGNOSIS — Z1152 Encounter for screening for COVID-19: Secondary | ICD-10-CM | POA: Diagnosis not present

## 2022-01-09 DIAGNOSIS — E039 Hypothyroidism, unspecified: Secondary | ICD-10-CM | POA: Diagnosis present

## 2022-01-09 DIAGNOSIS — J9601 Acute respiratory failure with hypoxia: Secondary | ICD-10-CM | POA: Diagnosis present

## 2022-01-09 DIAGNOSIS — D638 Anemia in other chronic diseases classified elsewhere: Secondary | ICD-10-CM | POA: Diagnosis present

## 2022-01-09 DIAGNOSIS — N179 Acute kidney failure, unspecified: Secondary | ICD-10-CM | POA: Insufficient documentation

## 2022-01-09 DIAGNOSIS — N3000 Acute cystitis without hematuria: Secondary | ICD-10-CM | POA: Diagnosis present

## 2022-01-09 DIAGNOSIS — F419 Anxiety disorder, unspecified: Secondary | ICD-10-CM | POA: Diagnosis present

## 2022-01-09 DIAGNOSIS — Z885 Allergy status to narcotic agent status: Secondary | ICD-10-CM | POA: Diagnosis not present

## 2022-01-09 DIAGNOSIS — K219 Gastro-esophageal reflux disease without esophagitis: Secondary | ICD-10-CM | POA: Diagnosis present

## 2022-01-09 DIAGNOSIS — J189 Pneumonia, unspecified organism: Secondary | ICD-10-CM | POA: Diagnosis present

## 2022-01-09 DIAGNOSIS — Z79899 Other long term (current) drug therapy: Secondary | ICD-10-CM | POA: Diagnosis not present

## 2022-01-09 DIAGNOSIS — I4821 Permanent atrial fibrillation: Secondary | ICD-10-CM | POA: Diagnosis present

## 2022-01-09 DIAGNOSIS — G4733 Obstructive sleep apnea (adult) (pediatric): Secondary | ICD-10-CM | POA: Diagnosis present

## 2022-01-09 DIAGNOSIS — I495 Sick sinus syndrome: Secondary | ICD-10-CM | POA: Diagnosis present

## 2022-01-09 DIAGNOSIS — G8929 Other chronic pain: Secondary | ICD-10-CM | POA: Diagnosis present

## 2022-01-09 LAB — CBC
HCT: 32.6 % — ABNORMAL LOW (ref 36.0–46.0)
Hemoglobin: 9.9 g/dL — ABNORMAL LOW (ref 12.0–15.0)
MCH: 24.1 pg — ABNORMAL LOW (ref 26.0–34.0)
MCHC: 30.4 g/dL (ref 30.0–36.0)
MCV: 79.5 fL — ABNORMAL LOW (ref 80.0–100.0)
Platelets: 239 10*3/uL (ref 150–400)
RBC: 4.1 MIL/uL (ref 3.87–5.11)
RDW: 16.9 % — ABNORMAL HIGH (ref 11.5–15.5)
WBC: 13.1 10*3/uL — ABNORMAL HIGH (ref 4.0–10.5)
nRBC: 0 % (ref 0.0–0.2)

## 2022-01-09 LAB — BASIC METABOLIC PANEL
Anion gap: 12 (ref 5–15)
BUN: 20 mg/dL (ref 8–23)
CO2: 28 mmol/L (ref 22–32)
Calcium: 9.2 mg/dL (ref 8.9–10.3)
Chloride: 99 mmol/L (ref 98–111)
Creatinine, Ser: 1.21 mg/dL — ABNORMAL HIGH (ref 0.44–1.00)
GFR, Estimated: 42 mL/min — ABNORMAL LOW (ref >60)
Glucose, Bld: 104 mg/dL — ABNORMAL HIGH (ref 70–99)
Potassium: 3.2 mmol/L — ABNORMAL LOW (ref 3.5–5.1)
Sodium: 139 mmol/L (ref 135–145)

## 2022-01-09 MED ORDER — LEVOFLOXACIN IN D5W 750 MG/150ML IV SOLN
750.0000 mg | INTRAVENOUS | Status: DC
  Administered 2022-01-10: 750 mg via INTRAVENOUS
  Filled 2022-01-09: qty 150

## 2022-01-09 MED ORDER — FAMOTIDINE 10 MG PO TABS: 10.0000 mg | ORAL_TABLET | ORAL | Status: DC | PRN

## 2022-01-09 MED ORDER — ACETAMINOPHEN 650 MG RE SUPP: 650.0000 mg | Freq: Four times a day (QID) | RECTAL | Status: DC | PRN

## 2022-01-09 MED ORDER — LEVOFLOXACIN IN D5W 750 MG/150ML IV SOLN
750.0000 mg | INTRAVENOUS | Status: DC
  Filled 2022-01-09: qty 150

## 2022-01-09 MED ORDER — PREDNISONE 20 MG PO TABS
40.0000 mg | ORAL_TABLET | Freq: Every day | ORAL | Status: AC
  Administered 2022-01-09 – 2022-01-12 (×4): 40 mg via ORAL
  Filled 2022-01-09 (×4): qty 2

## 2022-01-09 MED ORDER — ACETAMINOPHEN 325 MG PO TABS
650.0000 mg | ORAL_TABLET | Freq: Four times a day (QID) | ORAL | Status: DC | PRN
  Administered 2022-01-10 – 2022-01-14 (×5): 650 mg via ORAL
  Filled 2022-01-09 (×4): qty 2

## 2022-01-09 MED ORDER — GUAIFENESIN-DM 100-10 MG/5ML PO SYRP
5.0000 mL | ORAL_SOLUTION | ORAL | Status: DC | PRN
  Administered 2022-01-10 – 2022-01-14 (×9): 5 mL via ORAL
  Filled 2022-01-09 (×9): qty 5

## 2022-01-09 MED ORDER — ALPRAZOLAM 0.25 MG PO TABS
0.2500 mg | ORAL_TABLET | Freq: Every evening | ORAL | Status: DC | PRN
  Administered 2022-01-10: 0.25 mg via ORAL
  Filled 2022-01-09: qty 1

## 2022-01-09 MED ORDER — ACETAMINOPHEN 325 MG PO TABS
650.0000 mg | ORAL_TABLET | Freq: Once | ORAL | Status: AC
  Administered 2022-01-09: 650 mg via ORAL
  Filled 2022-01-09: qty 2

## 2022-01-09 MED ORDER — ALBUTEROL SULFATE (2.5 MG/3ML) 0.083% IN NEBU
2.5000 mg | INHALATION_SOLUTION | Freq: Four times a day (QID) | RESPIRATORY_TRACT | Status: DC | PRN
  Administered 2022-01-10: 2.5 mg via RESPIRATORY_TRACT
  Filled 2022-01-09: qty 3

## 2022-01-09 MED ORDER — SODIUM CHLORIDE 0.9 % IV BOLUS
500.0000 mL | Freq: Once | INTRAVENOUS | Status: AC
  Administered 2022-01-09: 500 mL via INTRAVENOUS

## 2022-01-09 MED ORDER — TRAMADOL HCL 50 MG PO TABS
100.0000 mg | ORAL_TABLET | Freq: Two times a day (BID) | ORAL | Status: DC | PRN
  Administered 2022-01-09 – 2022-01-13 (×6): 100 mg via ORAL
  Filled 2022-01-09 (×6): qty 2

## 2022-01-09 NOTE — ED Notes (Signed)
Family called to update on transfer. Spoke with Luvenia Redden.

## 2022-01-09 NOTE — ED Notes (Signed)
Bp 88/70, asymptomatic. In bed. Provider notified

## 2022-01-09 NOTE — ED Notes (Signed)
16:61 Lauren at [facility] will send transport.-ABB(NS)

## 2022-01-09 NOTE — ED Notes (Signed)
Report attempted. 6N Charge RN needs to approve before assigning receiving RN.

## 2022-01-09 NOTE — ED Notes (Signed)
Pt sleeping at this time Dtg at bedside

## 2022-01-09 NOTE — ED Notes (Signed)
Updated family via phone

## 2022-01-09 NOTE — H&P (Signed)
History and Physical    IFRAH VEST NID:[id number] DOB: [birth year] DOA: 01/08/2022  PCP: [name], MD  Patient coming from: [place] ED  Chief Complaint: Low-grade fevers  HPI: Anna Farrell is a 89 y.o. female with medical history significant of asthma, GERD, anxiety, chronic A-fib on Xarelto, tachy-brady syndrome status post pacemaker, OSA, RLS, hypothyroidism, hyperlipidemia, hypertension presented to [place] ED yesterday with complaints of low-grade fevers x 3 weeks, URI symptoms, and dysuria.  In the ED, patient noted to be tachycardic, tachypneic, and oxygen saturation in the mid to high 80s.  Placed on 2 L O2.  Labs showing WBC 17.7, hemoglobin 11.7 (baseline 11.8-12.5), potassium 3.4, COVID/influenza/RSV PCR negative, BNP 419, troponin negative x 2.  UA with moderate leukocytes and microscopy showing >50 WBCs.  CT angiogram chest negative for PE but showing subtle lung base parenchymal opacities suspicious for possible acute infiltrates. Repeat labs done today showing WBC 13.1, hemoglobin 9.9, potassium 3.2, creatinine 1.2 (baseline 0.7-0.9). Patient received Tylenol, tramadol, Xanax, prednisone 40 mg, Xarelto, IV Levaquin, 1 L IV fluids, and potassium supplement in the ED.    Transferred to [facility] this evening for admission.  Patient reports 3-week history of productive cough and shortness of breath.  She is not sure if she was having fevers as she has not checked her temperature at home.  States her breathing has been worse for the past 2 days.  She is also endorsing dysuria and reports history of UTIs.  Denies chest pain.  Denies nausea, vomiting, or abdominal pain.  She uses an inhaler as needed and has never smoked cigarettes.  No other complaints.  Review of Systems:  Review of Systems  All other systems reviewed and are negative.   Past Medical History:  Diagnosis Date   Asymptomatic PVCs    Cholelithiasis    Chronic low back pain 2008   crushed vertebrae    Colitis 5/13   Extrinsic asthma, unspecified    LOV Dr Joya Gaskins 7/12   Generalized osteoarthrosis, unspecified site    History of colonic polyps    Hyperlipidemia    Hypertension    Iron deficiency anemia    Long term (current) use of anticoagulants    Orthostatic hypotension    Osteoporosis    Pacemaker 10/11/06   implanted by Dr Leonia Reeves for pauses > 4 seconds with afib   Pancreatitis    Permanent atrial fibrillation ([place])    Tachycardia-bradycardia syndrome ([place])    Vasovagal syncope     Past Surgical History:  Procedure Laterality Date   ABDOMINAL HYSTERECTOMY     APPENDECTOMY     CARDIAC CATHETERIZATION  2009   Normal coronary arteries   CHOLECYSTECTOMY  09/17/2011   Procedure: LAPAROSCOPIC CHOLECYSTECTOMY;  Surgeon: Rolm Bookbinder, MD;  Location: [facility];  Service: General;;   COLONOSCOPY     ERCP N/A 04/07/2013   Procedure: ENDOSCOPIC RETROGRADE CHOLANGIOPANCREATOGRAPHY (ERCP);  Surgeon: Jeryl Columbia, MD;  Location: [facility];  Service: Endoscopy;  Laterality: N/A;   EUS  04/07/2013   Procedure: UPPER ENDOSCOPIC ULTRASOUND (EUS) RADIAL;  Surgeon: Jeryl Columbia, MD;  Location: [facility];  Service: Endoscopy;;   HIP ARTHROPLASTY Left 08/17/2013   Procedure: ARTHROPLASTY BIPOLAR HIP;  Surgeon: Renette Butters, MD;  Location: [facility];  Service: Orthopedics;  Laterality: Left;   PACEMAKER INSERTION  10/11/2006   SJM Zephyr XL DR implanted by Dr Leonia Reeves for afib with pause > 4 seconds   PPM GENERATOR CHANGEOUT N/A 03/16/2019  Procedure: PPM GENERATOR CHANGEOUT;  Surgeon: Thompson Grayer, MD;  Location: [facility];  Service: Cardiovascular;  Laterality: N/A;   VESICOVAGINAL FISTULA CLOSURE W/ TAH  1981     reports that she has never smoked. She has never used smokeless tobacco. She reports that she does not drink alcohol and does not use drugs.  Allergies  Allergen Reactions   Codeine Nausea And Vomiting    Passed out   Betadine [Povidone Iodine]     burning    Rocephin [Ceftriaxone Sodium In Dextrose] Itching    Family History  Problem Relation Age of Onset   Hypertension Mother    Pancreatitis Mother    Cancer Mother        when she was younger    Prior to Admission medications   Medication Sig Start Date End Date Taking? Authorizing Provider  acetaminophen (TYLENOL) 325 MG tablet Take 500 mg by mouth every 6 (six) hours as needed.   Yes [provider]  ALPRAZolam (XANAX) 0.25 MG tablet Take 0.25 mg by mouth 3 (three) times daily as needed (at night for restless legs, only takes at night up to 3 tablets). 08/05/16  Yes [provider]  amLODipine (NORVASC) 5 MG tablet TAKE 1 TABLET(5 MG) BY MOUTH DAILY 09/12/21  Yes Turner, Eber Hong, MD  benzonatate (TESSALON) 100 MG capsule Take 1 capsule (100 mg total) by mouth every 8 (eight) hours. 01/08/22  Yes Regan Lemming, MD  Budeson-Glycopyrrol-Formoterol (BREZTRI AEROSPHERE) 160-9-4.8 MCG/ACT AERO Inhale into the lungs. Got sample from dr office, uses as needed   Yes [provider]  famotidine (PEPCID) 20 MG tablet Take 20 mg by mouth as needed for heartburn or indigestion.   Yes [provider]  latanoprost (XALATAN) 0.005 % ophthalmic solution Place 1 drop into the right eye every evening.   Yes [provider]  levofloxacin (LEVAQUIN) 250 MG tablet Take 1 tablet (250 mg total) by mouth daily for 5 days. 01/08/22 01/13/22 Yes Regan Lemming, MD  lisinopril (ZESTRIL) 40 MG tablet Take 20 mg by mouth daily. 02/26/19  Yes [provider]  metoprolol succinate (TOPROL-XL) 100 MG 24 hr tablet Take 100 mg daily by mouth. Take with or immediately following a meal.   Yes [provider]  Multiple Vitamins-Minerals (EYE VITAMINS PO) Take 1 tablet by mouth in the morning and at bedtime. preservision   Yes [provider]  ondansetron (ZOFRAN) 8 MG tablet Take 8 mg by mouth daily as needed (Car sick).  03/04/19  Yes [provider]   pravastatin (PRAVACHOL) 40 MG tablet Take 1 tablet (40 mg total) by mouth daily. Once daily 07/08/13  Yes Turner, Eber Hong, MD  predniSONE (DELTASONE) 10 MG tablet Take 4 tablets (40 mg total) by mouth daily for 5 days. 01/08/22 01/13/22 Yes Regan Lemming, MD  Rivaroxaban (XARELTO) 15 MG TABS tablet TAKE 1 TABLET(15 MG) BY MOUTH DAILY WITH SUPPER 06/14/21  Yes Turner, Eber Hong, MD  traMADol (ULTRAM) 50 MG tablet Take 100 mg by mouth every 6 (six) hours as needed (max 8 tablets per day).   Yes [provider]    Physical Exam: Vitals:   01/09/22 1530 01/09/22 1600 01/09/22 1700 01/09/22 1800  BP: 107/78 122/73 111/71 127/81  Pulse: (!) 147 (!) 108 (!) 102 93  Resp: (!) 23 19 (!) 26 16  Temp:    98.6 F (37 C)  TempSrc:    Oral  SpO2: 96% 100% 100% 95%  Weight:  Height:        Physical Exam Vitals reviewed.  Constitutional:      General: She is not in acute distress.    Comments: Resting comfortably, eating dinner  HENT:     Head: Normocephalic and atraumatic.  Eyes:     Extraocular Movements: Extraocular movements intact.  Cardiovascular:     Rate and Rhythm: Normal rate and regular rhythm.     Pulses: Normal pulses.  Pulmonary:     Effort: Pulmonary effort is normal. No respiratory distress.     Breath sounds: Wheezing present. No rales.     Comments: Mild end expiratory wheezing Abdominal:     General: Bowel sounds are normal. There is no distension.     Palpations: Abdomen is soft.     Tenderness: There is no abdominal tenderness.  Musculoskeletal:     Cervical back: Normal range of motion.     Right lower leg: No edema.     Left lower leg: No edema.  Skin:    General: Skin is warm and dry.  Neurological:     General: No focal deficit present.     Mental Status: She is alert and oriented to person, place, and time.     Labs on Admission: I have personally reviewed following labs and imaging studies  CBC: Recent Labs  Lab 01/08/22 1201 01/09/22 0734   WBC 17.7* 13.1*  HGB 11.7* 9.9*  HCT 39.2 32.6*  MCV 80.7 79.5*  PLT 355 779   Basic Metabolic Panel: Recent Labs  Lab 01/08/22 1201 01/09/22 0734  NA 140 139  K 3.4* 3.2*  CL 98 99  CO2 29 28  GLUCOSE 197* 104*  BUN 13 20  CREATININE 0.85 1.21*  CALCIUM 9.7 9.2   GFR: Estimated Creatinine Clearance: 25.5 mL/min (A) (by C-G formula based on SCr of 1.21 mg/dL (H)). Liver Function Tests: Recent Labs  Lab 01/08/22 1201  AST 15  ALT 7  ALKPHOS 74  BILITOT 0.9  PROT 7.0  ALBUMIN 4.0   No results for input(s): "LIPASE", "AMYLASE" in the last 168 hours. No results for input(s): "AMMONIA" in the last 168 hours. Coagulation Profile: No results for input(s): "INR", "PROTIME" in the last 168 hours. Cardiac Enzymes: No results for input(s): "CKTOTAL", "CKMB", "CKMBINDEX", "TROPONINI" in the last 168 hours. BNP (last 3 results) No results for input(s): "PROBNP" in the last 8760 hours. HbA1C: No results for input(s): "HGBA1C" in the last 72 hours. CBG: No results for input(s): "GLUCAP" in the last 168 hours. Lipid Profile: No results for input(s): "CHOL", "HDL", "LDLCALC", "TRIG", "CHOLHDL", "LDLDIRECT" in the last 72 hours. Thyroid Function Tests: No results for input(s): "TSH", "T4TOTAL", "FREET4", "T3FREE", "THYROIDAB" in the last 72 hours. Anemia Panel: No results for input(s): "VITAMINB12", "FOLATE", "FERRITIN", "TIBC", "IRON", "RETICCTPCT" in the last 72 hours. Urine analysis:    Component Value Date/Time   COLORURINE YELLOW 01/08/2022 1829   APPEARANCEUR HAZY (A) 01/08/2022 1829   LABSPEC 1.043 (H) 01/08/2022 1829   PHURINE 8.5 (H) 01/08/2022 1829   GLUCOSEU NEGATIVE 01/08/2022 1829   HGBUR NEGATIVE 01/08/2022 1829   BILIRUBINUR NEGATIVE 01/08/2022 1829   KETONESUR NEGATIVE 01/08/2022 1829   PROTEINUR 100 (A) 01/08/2022 1829   UROBILINOGEN 1.0 01/30/2014 1421   NITRITE NEGATIVE 01/08/2022 1829   LEUKOCYTESUR MODERATE (A) 01/08/2022 1829    Radiological  Exams on Admission: CT Angio Chest PE W and/or Wo Contrast  Result Date: 01/08/2022 CLINICAL DATA:  Hypomobility clinical suspicion for pulmonary embolism. Shortness  of breath. EXAM: CT ANGIOGRAPHY CHEST WITH CONTRAST TECHNIQUE: Multidetector CT imaging of the chest was performed using the standard protocol during bolus administration of intravenous contrast. Multiplanar CT image reconstructions and MIPs were obtained to evaluate the vascular anatomy. RADIATION DOSE REDUCTION: This exam was performed according to the departmental dose-optimization program which includes automated exposure control, adjustment of the mA and/or kV according to patient size and/or use of iterative reconstruction technique. CONTRAST:  55m OMNIPAQUE IOHEXOL 350 MG/ML SOLN COMPARISON:  Chest x-ray 01/08/2022 and older. Old CT scan 2009 abdomen and pelvis CT 01/30/2014 FINDINGS: Cardiovascular: No segmental or larger pulmonary embolism identified. No abnormal enlargement of the main pulmonary artery. The thoracic aorta has normal caliber. There is a tortuous course of the descending thoracic aorta. Scattered vascular calcifications are seen. Coronary artery calcifications are also noted. Left upper chest battery pack with leads extending along the right side of the heart. Pacemaker. Mediastinum/Nodes: Nonspecific abnormal lymph node enlargement identified in the axillary region, hilum and mediastinum. Moderate small hiatal hernia. Lungs/Pleura: Mild breathing motion. There is some patchy opacity seen with some ground-glass components in both lower lobes. There are areas of bronchiectasis identified along the lingula with some opacity along the area of bronchiectasis. Additional smaller nodules are seen such as middle lobe on axial image 72 measuring 5 mm. Additional small foci under 5 mm in lingula inferiorly. No pneumothorax or effusion. Upper Abdomen: Previous cholecystectomy. Adrenal glands are preserved. Dilated common duct. This has  been present since at least 2016 9 CT scan. Musculoskeletal: Scattered degenerative changes along the spine. There is moderate compression deformity L1 with augmentation cement. Slight kyphosis. Review of the MIP images confirms the above findings. IMPRESSION: No pulmonary embolism identified. Subtle lung base parenchymal opacities. Acute infiltrates possible. Recommend follow-up. Few tiny lung nodules are seen measuring up to 5 mm. No follow-up needed if patient is low-risk (and has no known or suspected primary neoplasm). Non-contrast chest CT can be considered in 12 months if patient is high-risk. This recommendation follows the consensus statement: Guidelines for Management of Incidental Pulmonary Nodules Detected on CT Images: From the Fleischner Society 2017; Radiology 2017; 284:228-243. Moderate hiatal hernia. Previous cholecystectomy. There is a dilated common duct. This has been present since at least 2016. Electronically Signed   By: AJill SideM.D.   On: 01/08/2022 18:08   DG Chest 2 View  Result Date: 01/08/2022 CLINICAL DATA:  Shortness of breath EXAM: CHEST - 2 VIEW COMPARISON:  Radiographs 07/27/2020 FINDINGS: Unchanged dual chamber pacemaker leads. Unchanged cardiomediastinal silhouette. There is no focal airspace consolidation. There is no pleural effusion or evidence of pneumothorax. Prior L1 vertebroplasty. No acute osseous abnormality. Thoracic spondylosis. IMPRESSION: No evidence of acute cardiopulmonary disease. Electronically Signed   By: JMaurine SimmeringM.D.   On: 01/08/2022 12:44    EKG: Independently reviewed.  A-fib, PVCs, T wave abnormalities inferolaterally.  No significant change compared to prior tracings.  Assessment and Plan  Acute hypoxemic respiratory failure secondary to acute asthma exacerbation and possible community-acquired pneumonia Oxygen saturation in the mid to high 80s on room air in the ED.  She is currently stable on 2 L O2.  Has mild wheezing on exam. CT  angiogram chest negative for PE but showing subtle lung base parenchymal opacities suspicious for possible acute infiltrates.  WBC count 17.7> 13.1.  No fever.  No longer tachycardic or tachypneic.  No signs of sepsis at this time. COVID/influenza/RSV PCR negative. -Continue Levaquin -Continue prednisone 40 mg daily -  Albuterol neb as needed -Antitussive as needed -Continue supplemental oxygen to keep oxygen saturation above 94% -Monitor WBC count  Possible UTI Patient is endorsing dysuria and UA with evidence of pyuria.  Has leukocytosis on labs but no signs of sepsis at this time. -Continue Levaquin -Urine culture  Anemia Hemoglobin 11.7 on initial labs (baseline 11.8-12.5).  Repeat labs showing hemoglobin 9.9, ?hemodilution after receiving IV fluids as WBC count and platelet count also lower than initial labs.  No signs or symptoms of bleeding. -Repeat CBC  Mild hypokalemia -Continue to monitor potassium and replace as needed -Check magnesium level  Mild AKI Creatinine 1.2, baseline 0.7-0.9. -Patient received 1 L IV fluids in the ED and BNP elevated.  Will hold additional IV fluids at this time and repeat BMP. -Avoid nephrotoxic agents/hold home lisinopril  Elevated BNP BNP 419.  She does not appear volume overloaded on exam and no evidence of pulmonary edema on imaging. Last echo done in July 2022 showing EF 70 to 75%. -Received 1 L IV fluids in the ED given mild AKI.  Will hold additional IV fluids and repeat BNP.  Echo in a.m.  Pulmonary nodules CT angiogram chest showing few tiny lung nodules measuring up to 5 mm.  She does not have history of cigarette smoking.  Per radiologist, no follow-up needed if patient is low-risk (and has no known or suspected primary neoplasm). Non-contrast chest CT can be considered in 12 months if patient is high-risk.   Anxiety -Continue home Xanax as needed  Chronic A-fib -Continue Xarelto -Hold metoprolol at this time as patient's blood  pressure was low in the ED with systolic as low as 26S.  Blood pressure currently improved and will continue to monitor closely.  Resume metoprolol if blood pressure remains stable.  Tachy-brady syndrome status post pacemaker -Cardiac monitoring  Hypertension Blood pressure low with systolic as low as 34H in the ED.  Systolic now improved to [id number]. -Hold antihypertensives at this time and continue to monitor blood pressure closely.  Resume home meds if blood pressure remains stable.  Hold lisinopril given mild AKI.  GERD -Continue Pepcid as needed  Chronic pain -Continue tramadol as needed  DVT prophylaxis: Xarelto Code Status: DNR/DNI (discussed with the patient and her son) Family Communication: Son at bedside. Level of care: Telemetry bed Admission status: It is my clinical opinion that admission to INPATIENT is reasonable and necessary because of the expectation that this patient will require hospital care that crosses at least 2 midnights to treat this condition based on the medical complexity of the problems presented.  Given the aforementioned information, the predictability of an adverse outcome is felt to be significant.   Shela Leff MD [facility]  If 7PM-7AM, please contact night-coverage [email or web address]  01/09/2022, 7:18 PM

## 2022-01-10 ENCOUNTER — Inpatient Hospital Stay (HOSPITAL_COMMUNITY): Payer: Medicare Other

## 2022-01-10 DIAGNOSIS — J9601 Acute respiratory failure with hypoxia: Secondary | ICD-10-CM | POA: Diagnosis not present

## 2022-01-10 DIAGNOSIS — I5031 Acute diastolic (congestive) heart failure: Secondary | ICD-10-CM

## 2022-01-10 LAB — RESPIRATORY PANEL BY PCR

## 2022-01-10 LAB — ECHOCARDIOGRAM COMPLETE
AR max vel: 1.35 cm2
AV Area VTI: 1.36 cm2
AV Area mean vel: 1.62 cm2
AV Mean grad: 10 mmHg
AV Peak grad: 20.9 mmHg
Ao pk vel: 2.29 m/s
Area-P 1/2: 4.46 cm2
Height: 62 in
MV M vel: 1.28 m/s
MV Peak grad: 6.6 mmHg
MV VTI: 3.11 cm2
S' Lateral: 2.6 cm
Weight: 2158.74 oz

## 2022-01-10 LAB — CBC
HCT: 33.7 % — ABNORMAL LOW (ref 36.0–46.0)
Hemoglobin: 9.8 g/dL — ABNORMAL LOW (ref 12.0–15.0)
MCH: 24 pg — ABNORMAL LOW (ref 26.0–34.0)
MCHC: 29.1 g/dL — ABNORMAL LOW (ref 30.0–36.0)
MCV: 82.6 fL (ref 80.0–100.0)
Platelets: 217 10*3/uL (ref 150–400)
RBC: 4.08 MIL/uL (ref 3.87–5.11)
RDW: 16.9 % — ABNORMAL HIGH (ref 11.5–15.5)
WBC: 7.6 10*3/uL (ref 4.0–10.5)
nRBC: 0 % (ref 0.0–0.2)

## 2022-01-10 LAB — BASIC METABOLIC PANEL
Anion gap: 7 (ref 5–15)
BUN: 22 mg/dL (ref 8–23)
CO2: 27 mmol/L (ref 22–32)
Calcium: 8.9 mg/dL (ref 8.9–10.3)
Chloride: 100 mmol/L (ref 98–111)
Creatinine, Ser: 0.88 mg/dL (ref 0.44–1.00)
GFR, Estimated: >60 mL/min (ref >60)
Glucose, Bld: 136 mg/dL — ABNORMAL HIGH (ref 70–99)
Potassium: 4.3 mmol/L (ref 3.5–5.1)
Sodium: 134 mmol/L — ABNORMAL LOW (ref 135–145)

## 2022-01-10 LAB — MAGNESIUM: Magnesium: 1.9 mg/dL (ref 1.7–2.4)

## 2022-01-10 LAB — BRAIN NATRIURETIC PEPTIDE: B Natriuretic Peptide: 261.3 pg/mL — ABNORMAL HIGH (ref 0.0–100.0)

## 2022-01-10 MED ORDER — LEVOFLOXACIN IN D5W 750 MG/150ML IV SOLN
750.0000 mg | INTRAVENOUS | Status: AC
  Administered 2022-01-12: 750 mg via INTRAVENOUS
  Filled 2022-01-10: qty 150

## 2022-01-10 MED ORDER — ALPRAZOLAM 0.25 MG PO TABS
0.2500 mg | ORAL_TABLET | Freq: Three times a day (TID) | ORAL | Status: DC | PRN
  Administered 2022-01-11 – 2022-01-14 (×5): 0.25 mg via ORAL
  Filled 2022-01-10 (×5): qty 1

## 2022-01-10 MED ORDER — METOPROLOL SUCCINATE ER 100 MG PO TB24
100.0000 mg | ORAL_TABLET | Freq: Every day | ORAL | Status: DC
  Administered 2022-01-11 – 2022-01-14 (×4): 100 mg via ORAL
  Filled 2022-01-10 (×5): qty 1

## 2022-01-10 MED ORDER — PHENOL 1.4 % MT LIQD
1.0000 | OROMUCOSAL | Status: DC | PRN
  Administered 2022-01-10: 1 via OROMUCOSAL
  Filled 2022-01-10: qty 177

## 2022-01-10 NOTE — Progress Notes (Signed)
PHARMACY NOTE:  ANTIMICROBIAL RENAL DOSAGE ADJUSTMENT  Current antimicrobial regimen includes a mismatch between antimicrobial dosage and estimated renal function.  As per policy approved by the Pharmacy & Therapeutics and Medical Executive Committees, the antimicrobial dosage will be adjusted accordingly.  Current antimicrobial dosage:  Levofloxacin '750mg'$  Q24H x 3 days  Indication: UTI  Renal Function:  Estimated Creatinine Clearance: 35.1 mL/min (by C-G formula based on SCr of 0.88 mg/dL). '[]'$      On intermittent HD, scheduled: '[]'$      On CRRT    Antimicrobial dosage has been changed to:  Levofloxacin '750mg'$  Q48H x 2 doses (will last 4 days given renal function)  Additional comments:   Thank you for allowing pharmacy to be a part of this patient's care.  Merrilee Jansky, [facility] 01/10/2022 11:26 AM

## 2022-01-10 NOTE — Progress Notes (Signed)
Mobility Specialist - Progress Note   01/10/22 1539  Mobility  Activity Ambulated with assistance in room  Level of Assistance Contact guard assist, steadying assist  Assistive Device  (HHA)  Distance Ambulated (ft) 20 ft  Activity Response Tolerated well  Mobility Referral Yes  $Mobility charge 1 Mobility    Pt received in room agreeable to mobility. HHA/MinG to safely ambulate in room. No complaints throughout. Left in bed w/ all needs met.   [place] Specialist Please contact via SecureChat or Rehab office at [phone]

## 2022-01-10 NOTE — Progress Notes (Signed)
PROGRESS NOTE  Anna Farrell [id number]:[id number] DOB: Dec 26, 1927 DOA: 01/08/2022 PCP: [name], MD   LOS: 1 day   Brief Narrative / Interim history: 89 year old female with asthma, anxiety, chronic A-fib on Xarelto, tachybradycardia syndrome status post pacemaker, OSA, hypothyroidism, HTN, HLD who comes into the hospital with low-grade fevers and URI type symptoms for the past 2 weeks, since past Christmas.  She is also been having dysuria.  In the ED she was found to be hypoxic requiring 2 L supplemental oxygen, had a white count of 17.7 and ABG with a CT angiogram showed bibasilar parenchymal opacities suspicious for possible acute infiltrates.  UA was also concerning for an infection.  She was placed on Levaquin and admitted to the hospital, and also on steroids due to faint wheezing on admission  Subjective / 24h Interval events: Continues to feel some shortness of breath and fatigued this morning.  Denies any chest pain, no nausea or vomiting.  Assesement and Plan: Principal Problem:   Acute hypoxemic respiratory failure (HCC) Active Problems:   Hypokalemia   CAP (community acquired pneumonia)   UTI (urinary tract infection)   Acute asthma exacerbation   Anemia   AKI (acute kidney injury) ([place])   Principal problem Acute hypoxic respiratory failure due to acute asthma exacerbation and community-acquired pneumonia -based on the presence of wheezing, leukocytosis, and imaging concerning for pneumonia.  She was placed on admission on steroids as well as Levaquin, continue.  White count improving today, now normalized.  Continue supportive care with breathing treatments, continue supplemental oxygen to maintain sats above 90% and wean off to room air as tolerated -PT consult today  Active problems UTI-she has symptoms, continue Levaquin, monitor urine cultures  Anemia of chronic disease-hemoglobin overall stable, no bleeding  Hypokalemia-repleted, potassium normalized this  morning  Acute kidney injury-resolved, creatinine returned to normal after IV fluids in the ED.  Continue to hold home lisinopril  Chronic diastolic CHF-clinically appears euvolemic, BNP slightly elevated.  Repeat 2D echo has been ordered by the admit her.  Most recent echo July 2022 showed EF 70 to 75%   Pulmonary nodules - CT angiogram chest showing few tiny lung nodules measuring up to 5 mm.  She does not have history of cigarette smoking.  Per radiologist, no follow-up needed if patient is low-risk (and has no known or suspected primary neoplasm). Non-contrast chest CT can be considered in 12 months if patient is high-risk.    Anxiety -Continue home Xanax as needed   Chronic A-fib -Continue Xarelto, blood pressure stable this morning, resume home metoprolol  Tachy-brady syndrome status post pacemaker -Cardiac monitoring   Hypertension - Blood pressure low with systolic as low as 86L in the ED.  Systolic now improved, resume metoprolol this morning   GERD -Continue Pepcid as needed   Chronic pain -Continue tramadol as needed  Scheduled Meds:  latanoprost  1 drop Right Eye QPM   metoprolol succinate  100 mg Oral Daily   predniSONE  40 mg Oral Daily   Rivaroxaban  15 mg Oral Q supper   Continuous Infusions:  levofloxacin (LEVAQUIN) IV     PRN Meds:.acetaminophen **OR** acetaminophen, albuterol, ALPRAZolam, famotidine, guaiFENesin-dextromethorphan, traMADol  Current Outpatient Medications  Medication Instructions   acetaminophen (TYLENOL) 500 mg, Oral, Every 6 hours PRN   ALPRAZolam (XANAX) 0.25 mg, Oral, 3 times daily PRN   amLODipine (NORVASC) 5 MG tablet TAKE 1 TABLET(5 MG) BY MOUTH DAILY   benzonatate (TESSALON) 100 mg, Oral, Every 8 hours  Budeson-Glycopyrrol-Formoterol (BREZTRI AEROSPHERE) 160-9-4.8 MCG/ACT AERO Inhalation, Got sample from dr office, uses as needed   famotidine (PEPCID) 20 mg, Oral, As needed   latanoprost (XALATAN) 0.005 % ophthalmic solution 1 drop,  Right Eye, Every evening   levofloxacin (LEVAQUIN) 250 mg, Oral, Daily   lisinopril (ZESTRIL) 20 mg, Oral, Daily   metoprolol succinate (TOPROL-XL) 100 mg, Oral, Daily, Take with or immediately following a meal.    Multiple Vitamins-Minerals (EYE VITAMINS PO) 1 tablet, Oral, 2 times daily, preservision   ondansetron (ZOFRAN) 8 mg, Oral, Daily PRN   pravastatin (PRAVACHOL) 40 mg, Oral, Daily, Once daily   predniSONE (DELTASONE) 40 mg, Oral, Daily   Rivaroxaban (XARELTO) 15 MG TABS tablet TAKE 1 TABLET(15 MG) BY MOUTH DAILY WITH SUPPER   traMADol (ULTRAM) 100 mg, Oral, Every 6 hours PRN    Diet Orders (From admission, onward)     Start     Ordered   01/09/22 2049  Diet Heart Room service appropriate? Yes; Fluid consistency: Thin  Diet effective now       Question Answer Comment  Room service appropriate? Yes   Fluid consistency: Thin      01/09/22 2051            DVT prophylaxis:  Rivaroxaban (XARELTO) tablet 15 mg   Lab Results  Component Value Date   PLT 217 01/10/2022      Code Status: DNR  Family Communication: no family at bedside  Status is: Inpatient  Remains inpatient appropriate because: severity of illness  Level of care: Telemetry Medical  Consultants:  none  Objective: Vitals:   01/09/22 1800 01/09/22 2111 01/10/22 0007 01/10/22 0527  BP: 127/81 119/65 134/89 (!) 147/82  Pulse: 93 (!) 53 98 87  Resp: 16     Temp: 98.6 F (37 C) 98.6 F (37 C) 98.3 F (36.8 C) 97.7 F (36.5 C)  TempSrc: Oral Oral Oral Oral  SpO2: 95% 95% 99% 97%  Weight:      Height:        Intake/Output Summary (Last 24 hours) at 01/10/2022 1022 Last data filed at 01/09/2022 1735 Gross per 24 hour  Intake --  Output 350 ml  Net -350 ml   Wt Readings from Last 3 Encounters:  01/08/22 61.2 kg  08/10/21 61.2 kg  01/31/21 61.2 kg    Examination:  Constitutional: NAD Eyes: no scleral icterus ENMT: Mucous membranes are moist.  Neck: normal, supple Respiratory: Faint  end expiratory wheeze, no crackles. Cardiovascular: Regular rate and rhythm, no murmurs / rubs / gallops. No LE edema.  Abdomen: non distended, no tenderness. Bowel sounds positive.  Musculoskeletal: no clubbing / cyanosis.  Skin: no rashes Neurologic: non focal   Data Reviewed: I have independently reviewed following labs and imaging studies   CBC Recent Labs  Lab 01/08/22 1201 01/09/22 0734 01/10/22 0236  WBC 17.7* 13.1* 7.6  HGB 11.7* 9.9* 9.8*  HCT 39.2 32.6* 33.7*  PLT 355 239 217  MCV 80.7 79.5* 82.6  MCH 24.1* 24.1* 24.0*  MCHC 29.8* 30.4 29.1*  RDW 17.0* 16.9* 16.9*    Recent Labs  Lab 01/08/22 1201 01/09/22 0734 01/10/22 0236  NA 140 139 134*  K 3.4* 3.2* 4.3  CL 98 99 100  CO2 '29 28 27  '$ GLUCOSE 197* 104* 136*  BUN '13 20 22  '$ CREATININE 0.85 1.21* 0.88  CALCIUM 9.7 9.2 8.9  AST 15  --   --   ALT 7  --   --  ALKPHOS 74  --   --   BILITOT 0.9  --   --   ALBUMIN 4.0  --   --   MG  --   --  1.9  BNP 419.4*  --  261.3*    ------------------------------------------------------------------------------------------------------------------ No results for input(s): "CHOL", "HDL", "LDLCALC", "TRIG", "CHOLHDL", "LDLDIRECT" in the last 72 hours.  Lab Results  Component Value Date   HGBA1C 6.0 (H) 01/31/2014   ------------------------------------------------------------------------------------------------------------------ No results for input(s): "TSH", "T4TOTAL", "T3FREE", "THYROIDAB" in the last 72 hours.  Invalid input(s): "FREET3"  Cardiac Enzymes No results for input(s): "CKMB", "TROPONINI", "MYOGLOBIN" in the last 168 hours.  Invalid input(s): "CK" ------------------------------------------------------------------------------------------------------------------    Component Value Date/Time   BNP 261.3 (H) 01/10/2022 0236    CBG: No results for input(s): "GLUCAP" in the last 168 hours.  Recent Results (from the past 240 hour(s))  Resp panel by  RT-PCR (RSV, Flu A&B, Covid) Anterior Nasal Swab     Status: None   Collection Time: 01/08/22 12:01 PM   Specimen: Anterior Nasal Swab  Result Value Ref Range Status   SARS Coronavirus 2 by RT PCR NEGATIVE NEGATIVE Final    Comment: (NOTE) SARS-CoV-2 target nucleic acids are NOT DETECTED.  The SARS-CoV-2 RNA is generally detectable in upper respiratory specimens during the acute phase of infection. The lowest concentration of SARS-CoV-2 viral copies this assay can detect is 138 copies/mL. A negative result does not preclude SARS-Cov-2 infection and should not be used as the sole basis for treatment or other patient management decisions. A negative result may occur with  improper specimen collection/handling, submission of specimen other than nasopharyngeal swab, presence of viral mutation(s) within the areas targeted by this assay, and inadequate number of viral copies(<138 copies/mL). A negative result must be combined with clinical observations, patient history, and epidemiological information. The expected result is Negative.  Fact Sheet for Patients:  EntrepreneurPulse.com.au  Fact Sheet for Healthcare Providers:  IncredibleEmployment.be  This test is no t yet approved or cleared by the [place] FDA and  has been authorized for detection and/or diagnosis of SARS-CoV-2 by FDA under an Emergency Use Authorization (EUA). This EUA will remain  in effect (meaning this test can be used) for the duration of the COVID-19 declaration under Section 564(b)(1) of the Act, 21 U.S.C.section 360bbb-3(b)(1), unless the authorization is terminated  or revoked sooner.       Influenza A by PCR NEGATIVE NEGATIVE Final   Influenza B by PCR NEGATIVE NEGATIVE Final    Comment: (NOTE) The Xpert Xpress SARS-CoV-2/FLU/RSV plus assay is intended as an aid in the diagnosis of influenza from Nasopharyngeal swab specimens and should not be used as a sole basis  for treatment. Nasal washings and aspirates are unacceptable for Xpert Xpress SARS-CoV-2/FLU/RSV testing.  Fact Sheet for Patients: EntrepreneurPulse.com.au  Fact Sheet for Healthcare Providers: IncredibleEmployment.be  This test is not yet approved or cleared by the [place] FDA and has been authorized for detection and/or diagnosis of SARS-CoV-2 by FDA under an Emergency Use Authorization (EUA). This EUA will remain in effect (meaning this test can be used) for the duration of the COVID-19 declaration under Section 564(b)(1) of the Act, 21 U.S.C. section 360bbb-3(b)(1), unless the authorization is terminated or revoked.     Resp Syncytial Virus by PCR NEGATIVE NEGATIVE Final    Comment: (NOTE) Fact Sheet for Patients: EntrepreneurPulse.com.au  Fact Sheet for Healthcare Providers: IncredibleEmployment.be  This test is not yet approved or cleared by the [place] FDA  and has been authorized for detection and/or diagnosis of SARS-CoV-2 by FDA under an Emergency Use Authorization (EUA). This EUA will remain in effect (meaning this test can be used) for the duration of the COVID-19 declaration under Section 564(b)(1) of the Act, 21 U.S.C. section 360bbb-3(b)(1), unless the authorization is terminated or revoked.  Performed at [facility], [place], [place] [address]   Respiratory (~20 pathogens) panel by PCR     Status: None   Collection Time: 01/10/22  6:20 AM   Specimen: Nasopharyngeal Swab; Respiratory  Result Value Ref Range Status   Adenovirus NOT DETECTED NOT DETECTED Final   Coronavirus 229E NOT DETECTED NOT DETECTED Final    Comment: (NOTE) The Coronavirus on the Respiratory Panel, DOES NOT test for the novel  Coronavirus (2019 nCoV)    Coronavirus HKU1 NOT DETECTED NOT DETECTED Final   Coronavirus NL63 NOT DETECTED NOT DETECTED Final   Coronavirus  OC43 NOT DETECTED NOT DETECTED Final   Metapneumovirus NOT DETECTED NOT DETECTED Final   Rhinovirus / Enterovirus NOT DETECTED NOT DETECTED Final   Influenza A NOT DETECTED NOT DETECTED Final   Influenza B NOT DETECTED NOT DETECTED Final   Parainfluenza Virus 1 NOT DETECTED NOT DETECTED Final   Parainfluenza Virus 2 NOT DETECTED NOT DETECTED Final   Parainfluenza Virus 3 NOT DETECTED NOT DETECTED Final   Parainfluenza Virus 4 NOT DETECTED NOT DETECTED Final   Respiratory Syncytial Virus NOT DETECTED NOT DETECTED Final   Bordetella pertussis NOT DETECTED NOT DETECTED Final   Bordetella Parapertussis NOT DETECTED NOT DETECTED Final   Chlamydophila pneumoniae NOT DETECTED NOT DETECTED Final   Mycoplasma pneumoniae NOT DETECTED NOT DETECTED Final    Comment: Performed at [facility], [address] [place], [place] [address]     Radiology Studies: No results found.   Marzetta Board, MD, PhD [facility]  Between 7 am - 7 pm I am available, please contact me via Amion (for emergencies) or Securechat (non urgent messages)  Between 7 pm - 7 am I am not available, please contact night coverage MD/APP via Amion

## 2022-01-10 NOTE — Progress Notes (Signed)
  Echocardiogram 2D Echocardiogram has been performed.  Anna Farrell 01/10/2022, 11:28 AM

## 2022-01-11 DIAGNOSIS — J9601 Acute respiratory failure with hypoxia: Secondary | ICD-10-CM | POA: Diagnosis not present

## 2022-01-11 LAB — CBC
HCT: 33.6 % — ABNORMAL LOW (ref 36.0–46.0)
Hemoglobin: 10 g/dL — ABNORMAL LOW (ref 12.0–15.0)
MCH: 23.8 pg — ABNORMAL LOW (ref 26.0–34.0)
MCHC: 29.8 g/dL — ABNORMAL LOW (ref 30.0–36.0)
MCV: 79.8 fL — ABNORMAL LOW (ref 80.0–100.0)
Platelets: 290 10*3/uL (ref 150–400)
RBC: 4.21 MIL/uL (ref 3.87–5.11)
RDW: 16.9 % — ABNORMAL HIGH (ref 11.5–15.5)
WBC: 9.8 10*3/uL (ref 4.0–10.5)
nRBC: 0 % (ref 0.0–0.2)

## 2022-01-11 LAB — COMPREHENSIVE METABOLIC PANEL
ALT: 13 U/L (ref 0–44)
AST: 21 U/L (ref 15–41)
Albumin: 2.7 g/dL — ABNORMAL LOW (ref 3.5–5.0)
Alkaline Phosphatase: 56 U/L (ref 38–126)
Anion gap: 7 (ref 5–15)
BUN: 16 mg/dL (ref 8–23)
CO2: 29 mmol/L (ref 22–32)
Calcium: 9.4 mg/dL (ref 8.9–10.3)
Chloride: 101 mmol/L (ref 98–111)
Creatinine, Ser: 0.85 mg/dL (ref 0.44–1.00)
GFR, Estimated: >60 mL/min (ref >60)
Glucose, Bld: 138 mg/dL — ABNORMAL HIGH (ref 70–99)
Potassium: 3.8 mmol/L (ref 3.5–5.1)
Sodium: 137 mmol/L (ref 135–145)
Total Bilirubin: 0.6 mg/dL (ref 0.3–1.2)
Total Protein: 5.6 g/dL — ABNORMAL LOW (ref 6.5–8.1)

## 2022-01-11 LAB — MAGNESIUM: Magnesium: 1.9 mg/dL (ref 1.7–2.4)

## 2022-01-11 MED ORDER — FUROSEMIDE 10 MG/ML IJ SOLN
40.0000 mg | Freq: Once | INTRAMUSCULAR | Status: AC
  Administered 2022-01-11: 40 mg via INTRAVENOUS
  Filled 2022-01-11: qty 4

## 2022-01-11 NOTE — Evaluation (Signed)
Physical Therapy Evaluation Patient Details Name: Anna Farrell MRN: [id number] DOB: 03/06/1927 Today's Date: 01/11/2022  History of Present Illness  Pt is a 89 y/o F admitted on 01/08/22 after pt presented with c/o low grade fevers & URI symptoms x 2 weeks, as well as dysuria. Pt found to be hypoxic requiring supplemental O2. CT angiogram showed bibasilar parenchymal opacities suspicious for possible acute infiltrates. UA was also concerning for an infection. PMH: asthma, anxiety, chronic a-fib on Xarelto, tachybradycardia syndrome s/p pacemaker, OSA, hypothyroidism, HTN, HLD  Clinical Impression  Pt seen for PT evaluation with pt's daughter Juliann Pulse) present for session. Prior to admission pt was mod I without AD in her small studio home, but pt did furniture walk. On this date, pt is able to complete STS with CGA<>supervision, ambulate without AD & CGA but supervision when using a RW. Pt is limited by fatigue as well as SOB (c/o dizziness after 2nd ambulation trial) & increased HR with mobility; MD in room & aware. Anticipate pt can d/c home with HHPT & assistance from family. Will continue to follow pt acutely to address endurance, strengthening, and gait with LRAD.  Pt on 2.5L/min via nasal cannula throughout session SpO2 as low as 80% after pt ambulates 2nd time; requires cuing for pursed lip breathing & extra time to recover to >/= 90% Max HR 168 bpm during 2nd ambulation trial (MD in room & aware), does decrease to 128 bpm with ~1-2 minutes of seated rest. Nurse also made aware.      Recommendations for follow up therapy are one component of a multi-disciplinary discharge planning process, led by the attending physician.  Recommendations may be updated based on patient status, additional functional criteria and insurance authorization.  Follow Up Recommendations Home health PT      Assistance Recommended at Discharge Intermittent Supervision/Assistance  Patient can return home with the  following  A little help with walking and/or transfers;A little help with bathing/dressing/bathroom;Assistance with cooking/housework;Assist for transportation    Equipment Recommendations None recommended by PT  Recommendations for Other Services       Functional Status Assessment Patient has had a recent decline in their functional status and demonstrates the ability to make significant improvements in function in a reasonable and predictable amount of time.     Precautions / Restrictions Precautions Precautions: Fall Precaution Comments: monitor HR & O2 Restrictions Weight Bearing Restrictions: No      Mobility  Bed Mobility               General bed mobility comments: not tested, pt received on BSC, left in recliner    Transfers Overall transfer level: Needs assistance Equipment used: Rolling walker (2 wheels) Transfers: Sit to/from Stand Sit to Stand: Min guard, Supervision                Ambulation/Gait Ambulation/Gait assistance: Min guard, Supervision Gait Distance (Feet): 25 Feet (+ 25) Assistive device: None, Rolling walker (2 wheels) Gait Pattern/deviations: Decreased step length - right, Decreased step length - left, Decreased stride length, Decreased dorsiflexion - right, Decreased dorsiflexion - left Gait velocity: decreased     General Gait Details: Pt ambulates 25 ft without AD but furniture walking with CGA, 25 ft with RW & supervision. No overt LOB during mobility.  Stairs            Wheelchair Mobility    Modified Rankin (Stroke Patients Only)       Balance Overall balance assessment: Needs assistance  Sitting balance-Leahy Scale: Good     Standing balance support: No upper extremity supported, During functional activity Standing balance-Leahy Scale: Fair                               Pertinent Vitals/Pain Pain Assessment Pain Assessment: No/denies pain    Home Living Family/patient expects to be  discharged to:: Private residence Living Arrangements: Alone Available Help at Discharge: Family;Available 24 hours/day Type of Home: House Home Access: Level entry       Home Layout: One level Home Equipment: Conservation officer, nature (2 wheels);Rollator (4 wheels);Cane - single point      Prior Function Prior Level of Function : Independent/Modified Independent             Mobility Comments: Pt recently declared legally blind, no longer drives. Furniture walks in her small studio home, denies falls in past 6 months. ADLs Comments: Daughter present with pt bathes but doesn't require assistance.     Hand Dominance        Extremity/Trunk Assessment   Upper Extremity Assessment Upper Extremity Assessment: Overall WFL for tasks assessed    Lower Extremity Assessment Lower Extremity Assessment: Generalized weakness       Communication   Communication: No difficulties  Cognition Arousal/Alertness: Awake/alert Behavior During Therapy: WFL for tasks assessed/performed Overall Cognitive Status: Within Functional Limits for tasks assessed                                          General Comments      Exercises     Assessment/Plan    PT Assessment Patient needs continued PT services  PT Problem List Pain;Cardiopulmonary status limiting activity;Decreased activity tolerance;Decreased knowledge of use of DME;Decreased balance;Decreased mobility       PT Treatment Interventions DME instruction;Therapeutic exercise;Balance training;Gait training;Stair training;Neuromuscular re-education;Functional mobility training;Therapeutic activities;Patient/family education    PT Goals (Current goals can be found in the Care Plan section)  Acute Rehab PT Goals Patient Stated Goal: to stay as long as needed to get better, not d/c home then have to come back PT Goal Formulation: With patient/family Time For Goal Achievement: 01/25/22 Potential to Achieve Goals: Good     Frequency Min 3X/week     Co-evaluation               AM-PAC PT "6 Clicks" Mobility  Outcome Measure Help needed turning from your back to your side while in a flat bed without using bedrails?: None Help needed moving from lying on your back to sitting on the side of a flat bed without using bedrails?: A Little Help needed moving to and from a bed to a chair (including a wheelchair)?: A Little Help needed standing up from a chair using your arms (e.g., wheelchair or bedside chair)?: A Little Help needed to walk in hospital room?: A Little Help needed climbing 3-5 steps with a railing? : A Little 6 Click Score: 19    End of Session Equipment Utilized During Treatment: Oxygen Activity Tolerance: Patient tolerated treatment well;Treatment limited secondary to medical complications (Comment) Patient left: in chair;with call bell/phone within reach Nurse Communication: Mobility status (HR, O2 during session) PT Visit Diagnosis: Muscle weakness (generalized) (M62.81);Unsteadiness on feet (R26.81)    Time: 6160-7371 PT Time Calculation (min) (ACUTE ONLY): 27 min   Charges:   PT Evaluation $  PT Eval Moderate Complexity: 1 Mod          Lavone Nian, PT, DPT 01/11/22, 9:30 AM   Waunita Schooner 01/11/2022, 9:28 AM

## 2022-01-11 NOTE — Progress Notes (Signed)
PROGRESS NOTE  Anna Farrell:[id number] DOB: Nov 09, 1927 DOA: 01/08/2022 PCP: [name], MD   LOS: 2 days   Brief Narrative / Interim history: 89 year old female with asthma, anxiety, chronic A-fib on Xarelto, tachybradycardia syndrome status post pacemaker, OSA, hypothyroidism, HTN, HLD who comes into the hospital with low-grade fevers and URI type symptoms for the past 2 weeks, since past Christmas.  She is also been having dysuria.  In the ED she was found to be hypoxic requiring 2 L supplemental oxygen, had a white count of 17.7 and ABG with a CT angiogram showed bibasilar parenchymal opacities suspicious for possible acute infiltrates.  UA was also concerning for an infection.  She was placed on Levaquin and admitted to the hospital, and also on steroids due to faint wheezing on admission  Subjective / 24h Interval events: Overall feeling better, but still short of breath with ambulation.  No chest pain  Assesement and Plan: Principal Problem:   Acute hypoxemic respiratory failure (HCC) Active Problems:   Hypokalemia   CAP (community acquired pneumonia)   UTI (urinary tract infection)   Acute asthma exacerbation   Anemia   AKI (acute kidney injury) ([place])   Principal problem Acute hypoxic respiratory failure due to acute asthma exacerbation and community-acquired pneumonia -based on the presence of wheezing, leukocytosis, and imaging concerning for pneumonia.  She was placed on admission on steroids as well as Levaquin, continue.  White count improving, now normalized.  Continue supportive care with breathing treatments, continue supplemental oxygen to maintain sats above 90% and wean off to room air as tolerated -PT consulted, recommending home health PT -Persistently hypoxic, due to elevated BNP will give Lasix x 1 today  Active problems UTI-she has symptoms, continue Levaquin, monitor urine cultures, still pending this morning  Anemia of chronic disease-hemoglobin  overall stable, no bleeding  Hypokalemia-repleted, potassium normal at 3.8 this morning  Acute kidney injury-resolved, creatinine returned to normal after IV fluids in the ED.  Continue to hold home lisinopril  Chronic diastolic CHF-clinically appears euvolemic, BNP slightly elevated.  Repeat 2D echo has been ordered by the admit her.  Most recent echo July 2022 showed EF 70 to 75%   Pulmonary nodules - CT angiogram chest showing few tiny lung nodules measuring up to 5 mm.  She does not have history of cigarette smoking.  Per radiologist, no follow-up needed if patient is low-risk (and has no known or suspected primary neoplasm). Non-contrast chest CT can be considered in 12 months if patient is high-risk.    Anxiety -Continue home Xanax as needed   Chronic A-fib -Continue Xarelto, blood pressure stable this morning, resume home metoprolol  Tachy-brady syndrome status post pacemaker -Cardiac monitoring   Hypertension -continue metoprolol   GERD -Continue Pepcid as needed   Chronic pain -Continue tramadol as needed  Scheduled Meds:  latanoprost  1 drop Right Eye QPM   metoprolol succinate  100 mg Oral Daily   predniSONE  40 mg Oral Daily   Rivaroxaban  15 mg Oral Q supper   Continuous Infusions:  [START ON 01/12/2022] levofloxacin (LEVAQUIN) IV     PRN Meds:.acetaminophen **OR** acetaminophen, albuterol, ALPRAZolam, famotidine, guaiFENesin-dextromethorphan, phenol, traMADol  Current Outpatient Medications  Medication Instructions   acetaminophen (TYLENOL) 500 mg, Oral, Every 6 hours PRN   ALPRAZolam (XANAX) 0.25 mg, Oral, 3 times daily PRN   amLODipine (NORVASC) 5 MG tablet TAKE 1 TABLET(5 MG) BY MOUTH DAILY   benzonatate (TESSALON) 100 mg, Oral, Every 8 hours  Budeson-Glycopyrrol-Formoterol (BREZTRI AEROSPHERE) 160-9-4.8 MCG/ACT AERO Inhalation, Got sample from dr office, uses as needed   famotidine (PEPCID) 20 mg, Oral, As needed   latanoprost (XALATAN) 0.005 % ophthalmic  solution 1 drop, Right Eye, Every evening   levofloxacin (LEVAQUIN) 250 mg, Oral, Daily   lisinopril (ZESTRIL) 20 mg, Oral, Daily   metoprolol succinate (TOPROL-XL) 100 mg, Oral, Daily, Take with or immediately following a meal.    Multiple Vitamins-Minerals (EYE VITAMINS PO) 1 tablet, Oral, 2 times daily, preservision   ondansetron (ZOFRAN) 8 mg, Oral, Daily PRN   pravastatin (PRAVACHOL) 40 mg, Oral, Daily, Once daily   predniSONE (DELTASONE) 40 mg, Oral, Daily   Rivaroxaban (XARELTO) 15 MG TABS tablet TAKE 1 TABLET(15 MG) BY MOUTH DAILY WITH SUPPER   traMADol (ULTRAM) 100 mg, Oral, Every 6 hours PRN    Diet Orders (From admission, onward)     Start     Ordered   01/09/22 2049  Diet Heart Room service appropriate? Yes; Fluid consistency: Thin  Diet effective now       Question Answer Comment  Room service appropriate? Yes   Fluid consistency: Thin      01/09/22 2051            DVT prophylaxis:  Rivaroxaban (XARELTO) tablet 15 mg   Lab Results  Component Value Date   PLT 290 01/11/2022      Code Status: DNR  Family Communication: Daughter present at bedside  Status is: Inpatient  Remains inpatient appropriate because: severity of illness  Level of care: Telemetry Medical  Consultants:  none  Objective: Vitals:   01/10/22 1742 01/10/22 2134 01/11/22 0500 01/11/22 0807  BP: 126/87 132/71 (!) 160/97 (!) 137/93  Pulse: (!) 101 98 100 95  Resp: '15  16 18  '$ Temp: (!) 97.5 F (36.4 C) 98.6 F (37 C) 98 F (36.7 C) 98.4 F (36.9 C)  TempSrc: Oral  Oral Oral  SpO2: 97% 96% 98% 98%  Weight:      Height:        Intake/Output Summary (Last 24 hours) at 01/11/2022 1043 Last data filed at 01/10/2022 1700 Gross per 24 hour  Intake 240 ml  Output --  Net 240 ml    Wt Readings from Last 3 Encounters:  01/08/22 61.2 kg  08/10/21 61.2 kg  01/31/21 61.2 kg    Examination:  Constitutional: NAD Eyes: lids and conjunctivae normal, no scleral icterus ENMT:  mmm Neck: normal, supple Respiratory: clear to auscultation bilaterally, no wheezing, no crackles. Normal respiratory effort.  Cardiovascular: Regular rate and rhythm, no murmurs / rubs / gallops.  Abdomen: soft, no distention, no tenderness. Bowel sounds positive.  Skin: no rashes Neurologic: no focal deficits, equal strength   Data Reviewed: I have independently reviewed following labs and imaging studies   CBC Recent Labs  Lab 01/08/22 1201 01/09/22 0734 01/10/22 0236 01/11/22 0420  WBC 17.7* 13.1* 7.6 9.8  HGB 11.7* 9.9* 9.8* 10.0*  HCT 39.2 32.6* 33.7* 33.6*  PLT 355 239 217 290  MCV 80.7 79.5* 82.6 79.8*  MCH 24.1* 24.1* 24.0* 23.8*  MCHC 29.8* 30.4 29.1* 29.8*  RDW 17.0* 16.9* 16.9* 16.9*     Recent Labs  Lab 01/08/22 1201 01/09/22 0734 01/10/22 0236 01/11/22 0420  NA 140 139 134* 137  K 3.4* 3.2* 4.3 3.8  CL 98 99 100 101  CO2 '29 28 27 29  '$ GLUCOSE 197* 104* 136* 138*  BUN '13 20 22 16  '$ CREATININE 0.85 1.21* 0.88  0.85  CALCIUM 9.7 9.2 8.9 9.4  AST 15  --   --  21  ALT 7  --   --  13  ALKPHOS 74  --   --  56  BILITOT 0.9  --   --  0.6  ALBUMIN 4.0  --   --  2.7*  MG  --   --  1.9 1.9  BNP 419.4*  --  261.3*  --      ------------------------------------------------------------------------------------------------------------------ No results for input(s): "CHOL", "HDL", "LDLCALC", "TRIG", "CHOLHDL", "LDLDIRECT" in the last 72 hours.  Lab Results  Component Value Date   HGBA1C 6.0 (H) 01/31/2014   ------------------------------------------------------------------------------------------------------------------ No results for input(s): "TSH", "T4TOTAL", "T3FREE", "THYROIDAB" in the last 72 hours.  Invalid input(s): "FREET3"  Cardiac Enzymes No results for input(s): "CKMB", "TROPONINI", "MYOGLOBIN" in the last 168 hours.  Invalid input(s):  "CK" ------------------------------------------------------------------------------------------------------------------    Component Value Date/Time   BNP 261.3 (H) 01/10/2022 0236    CBG: No results for input(s): "GLUCAP" in the last 168 hours.  Recent Results (from the past 240 hour(s))  Resp panel by RT-PCR (RSV, Flu A&B, Covid) Anterior Nasal Swab     Status: None   Collection Time: 01/08/22 12:01 PM   Specimen: Anterior Nasal Swab  Result Value Ref Range Status   SARS Coronavirus 2 by RT PCR NEGATIVE NEGATIVE Final    Comment: (NOTE) SARS-CoV-2 target nucleic acids are NOT DETECTED.  The SARS-CoV-2 RNA is generally detectable in upper respiratory specimens during the acute phase of infection. The lowest concentration of SARS-CoV-2 viral copies this assay can detect is 138 copies/mL. A negative result does not preclude SARS-Cov-2 infection and should not be used as the sole basis for treatment or other patient management decisions. A negative result may occur with  improper specimen collection/handling, submission of specimen other than nasopharyngeal swab, presence of viral mutation(s) within the areas targeted by this assay, and inadequate number of viral copies(<138 copies/mL). A negative result must be combined with clinical observations, patient history, and epidemiological information. The expected result is Negative.  Fact Sheet for Patients:  EntrepreneurPulse.com.au  Fact Sheet for Healthcare Providers:  IncredibleEmployment.be  This test is no t yet approved or cleared by the [place] FDA and  has been authorized for detection and/or diagnosis of SARS-CoV-2 by FDA under an Emergency Use Authorization (EUA). This EUA will remain  in effect (meaning this test can be used) for the duration of the COVID-19 declaration under Section 564(b)(1) of the Act, 21 U.S.C.section 360bbb-3(b)(1), unless the authorization is terminated   or revoked sooner.       Influenza A by PCR NEGATIVE NEGATIVE Final   Influenza B by PCR NEGATIVE NEGATIVE Final    Comment: (NOTE) The Xpert Xpress SARS-CoV-2/FLU/RSV plus assay is intended as an aid in the diagnosis of influenza from Nasopharyngeal swab specimens and should not be used as a sole basis for treatment. Nasal washings and aspirates are unacceptable for Xpert Xpress SARS-CoV-2/FLU/RSV testing.  Fact Sheet for Patients: EntrepreneurPulse.com.au  Fact Sheet for Healthcare Providers: IncredibleEmployment.be  This test is not yet approved or cleared by the [place] FDA and has been authorized for detection and/or diagnosis of SARS-CoV-2 by FDA under an Emergency Use Authorization (EUA). This EUA will remain in effect (meaning this test can be used) for the duration of the COVID-19 declaration under Section 564(b)(1) of the Act, 21 U.S.C. section 360bbb-3(b)(1), unless the authorization is terminated or revoked.     Resp Syncytial Virus  by PCR NEGATIVE NEGATIVE Final    Comment: (NOTE) Fact Sheet for Patients: EntrepreneurPulse.com.au  Fact Sheet for Healthcare Providers: IncredibleEmployment.be  This test is not yet approved or cleared by the [place] FDA and has been authorized for detection and/or diagnosis of SARS-CoV-2 by FDA under an Emergency Use Authorization (EUA). This EUA will remain in effect (meaning this test can be used) for the duration of the COVID-19 declaration under Section 564(b)(1) of the Act, 21 U.S.C. section 360bbb-3(b)(1), unless the authorization is terminated or revoked.  Performed at [facility], [place], [place] [address]   Respiratory (~20 pathogens) panel by PCR     Status: None   Collection Time: 01/10/22  6:20 AM   Specimen: Nasopharyngeal Swab; Respiratory  Result Value Ref Range Status   Adenovirus NOT  DETECTED NOT DETECTED Final   Coronavirus 229E NOT DETECTED NOT DETECTED Final    Comment: (NOTE) The Coronavirus on the Respiratory Panel, DOES NOT test for the novel  Coronavirus (2019 nCoV)    Coronavirus HKU1 NOT DETECTED NOT DETECTED Final   Coronavirus NL63 NOT DETECTED NOT DETECTED Final   Coronavirus OC43 NOT DETECTED NOT DETECTED Final   Metapneumovirus NOT DETECTED NOT DETECTED Final   Rhinovirus / Enterovirus NOT DETECTED NOT DETECTED Final   Influenza A NOT DETECTED NOT DETECTED Final   Influenza B NOT DETECTED NOT DETECTED Final   Parainfluenza Virus 1 NOT DETECTED NOT DETECTED Final   Parainfluenza Virus 2 NOT DETECTED NOT DETECTED Final   Parainfluenza Virus 3 NOT DETECTED NOT DETECTED Final   Parainfluenza Virus 4 NOT DETECTED NOT DETECTED Final   Respiratory Syncytial Virus NOT DETECTED NOT DETECTED Final   Bordetella pertussis NOT DETECTED NOT DETECTED Final   Bordetella Parapertussis NOT DETECTED NOT DETECTED Final   Chlamydophila pneumoniae NOT DETECTED NOT DETECTED Final   Mycoplasma pneumoniae NOT DETECTED NOT DETECTED Final    Comment: Performed at [facility], [address], [place], [place] [address]     Radiology Studies: ECHOCARDIOGRAM COMPLETE  Result Date: 01/10/2022    ECHOCARDIOGRAM REPORT   Patient Name:   MIKELLA LINSLEY Date of Exam: 01/10/2022 Medical Rec #:  [id number]     Height:       62.0 in Accession #:    [id number]    Weight:       134.9 lb Date of Birth:  [birth year]     BSA:          1.617 m Patient Age:    63 years      BP:           100/62 mmHg Patient Gender: F             HR:           99 bpm. Exam Location:  Inpatient Procedure: 2D Echo, Color Doppler and Cardiac Doppler Indications:    CHF-Acute Diastolic W96.75  History:        Patient has prior history of Echocardiogram examinations, most                 recent 07/28/2020. Pacemaker, Arrythmias:Tachycardia and Atrial                 Fibrillation; Risk Factors:Non-Smoker, Dyslipidemia and                  Hypertension.  Sonographer:    Greer Pickerel Referring Phys: [id number] Shela Leff  Sonographer Comments: Image acquisition challenging due to  patient body habitus and Image acquisition challenging due to respiratory motion. Technically difficult due to lung interference, active URI IMPRESSIONS  1. Left ventricular ejection fraction, by estimation, is 65 to 70%. The left ventricle has normal function. The left ventricle has no regional wall motion abnormalities. There is mild left ventricular hypertrophy. Left ventricular diastolic parameters are indeterminate.  2. Right ventricular systolic function is normal. The right ventricular size is normal. There is normal pulmonary artery systolic pressure.  3. Left atrial size was moderately dilated.  4. A small pericardial effusion is present.  5. The mitral valve is degenerative. Trivial mitral valve regurgitation. No evidence of mitral stenosis. Moderate mitral annular calcification.  6. The aortic valve is calcified. There is moderate calcification of the aortic valve. Aortic valve regurgitation is not visualized. Mild aortic valve stenosis. Vmax 2.3 m/s, MG 10 mmHg, AVA 1.4 cm^2, DI 0.53  7. Aortic dilatation noted. There is dilatation of the aortic root, measuring 42 mm.  8. The inferior vena cava is dilated in size with >50% respiratory variability, suggesting right atrial pressure of 8 mmHg. FINDINGS  Left Ventricle: Left ventricular ejection fraction, by estimation, is 65 to 70%. The left ventricle has normal function. The left ventricle has no regional wall motion abnormalities. The left ventricular internal cavity size was normal in size. There is  mild left ventricular hypertrophy. Left ventricular diastolic parameters are indeterminate. Right Ventricle: The right ventricular size is normal. No increase in right ventricular wall thickness. Right ventricular systolic function is normal. There is normal pulmonary artery systolic pressure. The  tricuspid regurgitant velocity is 1.87 m/s, and  with an assumed right atrial pressure of 8 mmHg, the estimated right ventricular systolic pressure is 32.9 mmHg. Left Atrium: Left atrial size was moderately dilated. Right Atrium: Right atrial size was normal in size. Pericardium: A small pericardial effusion is present. Presence of epicardial fat layer. Mitral Valve: The mitral valve is degenerative in appearance. Moderate mitral annular calcification. Trivial mitral valve regurgitation. No evidence of mitral valve stenosis. MV peak gradient, 3.7 mmHg. The mean mitral valve gradient is 1.0 mmHg. Tricuspid Valve: The tricuspid valve is normal in structure. Tricuspid valve regurgitation is trivial. Aortic Valve: The aortic valve is calcified. There is moderate calcification of the aortic valve. Aortic valve regurgitation is not visualized. Mild aortic stenosis is present. Aortic valve mean gradient measures 10.0 mmHg. Aortic valve peak gradient measures 20.9 mmHg. Aortic valve area, by VTI measures 1.36 cm. Pulmonic Valve: The pulmonic valve was not well visualized. Pulmonic valve regurgitation is not visualized. Aorta: Aortic dilatation noted. There is dilatation of the aortic root, measuring 42 mm. Venous: The inferior vena cava is dilated in size with greater than 50% respiratory variability, suggesting right atrial pressure of 8 mmHg. IAS/Shunts: The interatrial septum was not well visualized.  LEFT VENTRICLE PLAX 2D LVIDd:         4.00 cm   Diastology LVIDs:         2.60 cm   LV e' medial:    11.50 cm/s LV PW:         1.20 cm   LV E/e' medial:  6.4 LV IVS:        1.00 cm   LV e' lateral:   9.90 cm/s LVOT diam:     1.80 cm   LV E/e' lateral: 7.5 LV SV:         54 LV SV Index:   34 LVOT Area:     2.54 cm  RIGHT VENTRICLE RV S prime:     15.60 cm/s TAPSE (M-mode): 1.6 cm LEFT ATRIUM             Index        RIGHT ATRIUM           Index LA diam:        4.10 cm 2.54 cm/m   RA Area:     16.80 cm LA Vol (A2C):    78.6 ml 48.60 ml/m  RA Volume:   36.40 ml  22.51 ml/m LA Vol (A4C):   62.5 ml 38.65 ml/m LA Biplane Vol: 71.0 ml 43.90 ml/m  AORTIC VALVE AV Area (Vmax):    1.35 cm AV Area (Vmean):   1.62 cm AV Area (VTI):     1.36 cm AV Vmax:           228.50 cm/s AV Vmean:          149.000 cm/s AV VTI:            0.398 m AV Peak Grad:      20.9 mmHg AV Mean Grad:      10.0 mmHg LVOT Vmax:         121.00 cm/s LVOT Vmean:        94.600 cm/s LVOT VTI:          0.213 m LVOT/AV VTI ratio: 0.53  AORTA Ao Root diam: 4.20 cm Ao Asc diam:  3.70 cm MITRAL VALVE               TRICUSPID VALVE MV Area (PHT): 4.46 cm    TR Peak grad:   14.0 mmHg MV Area VTI:   3.11 cm    TR Vmax:        187.00 cm/s MV Peak grad:  3.7 mmHg MV Mean grad:  1.0 mmHg    SHUNTS MV Vmax:       0.96 m/s    Systemic VTI:  0.21 m MV Vmean:      48.6 cm/s   Systemic Diam: 1.80 cm MV Decel Time: 170 msec MR Peak grad: 6.6 mmHg MR Vmax:      128.00 cm/s MV E velocity: 74.10 cm/s MV A velocity: 30.40 cm/s MV E/A ratio:  2.44 Oswaldo Milian MD Electronically signed by Oswaldo Milian MD Signature Date/Time: 01/10/2022/12:48:09 PM    Final      Marzetta Board, MD, PhD [facility]  Between 7 am - 7 pm I am available, please contact me via Amion (for emergencies) or Securechat (non urgent messages)  Between 7 pm - 7 am I am not available, please contact night coverage MD/APP via Amion

## 2022-01-11 NOTE — TOC Initial Note (Signed)
Transition of Care (TOC) - Initial/Assessment Note   Spoke to patient and [name] at bedside. Patient from home has her own place on daughters land.   Confirmed face sheet information.  PT recommending HHPT . Patient and daughter in agreement. Choice offered. Patient would like [facility] ( [place]) . Anna Farrell with [facility] accepted referral.   Patient currently on oxygen , and does not have home oxygen. If needed will ned order and ambulation oxygenation saturation note. If needed portable oxygen DME swill be delivered to room  Patient Details  Name: Anna Farrell MRN: [id number] Date of Birth: [birth year]  Transition of Care [facility]) CM/SW Contact:    Anna Favre, RN Phone Number: 01/11/2022, 10:55 AM  Clinical Narrative:                   Expected Discharge Plan: [place] Barriers to Discharge: Continued Medical Work up   Patient Goals and CMS Choice Patient states their goals for this hospitalization and ongoing recovery are:: to return to home CMS Medicare.gov Compare Post Acute Care list provided to:: Patient Choice offered to / list presented to : Patient, Adult Children      Expected Discharge Plan and Services   Discharge Planning Services: CM Consult Post Acute Care Choice: [place] arrangements for the past 2 months: Single Family Home                 DME Arranged: N/A DME Agency: NA       HH Arranged: RN, PT [place] Agency: [place] ([place]) Date [place]: 01/11/22 Time [place]: 50 Representative spoke with at [place]: [place] Arrangements/Services Living arrangements for the past 2 months: [place] Lives with:: Self Patient language and need for interpreter reviewed:: Yes Do you feel safe going back to the place where you live?: Yes      Need for Family Participation in Patient Care: Yes (Comment) Care giver support system in place?: Yes  (comment) Current home services: DME Criminal Activity/Legal Involvement Pertinent to Current Situation/Hospitalization: No - Comment as needed  Activities of Daily Living Home Assistive Devices/Equipment: Shower chair with back, Environmental consultant (specify type) ADL Screening (condition at time of admission) Patient's cognitive ability adequate to safely complete daily activities?: No Is the patient deaf or have difficulty hearing?: Yes Does the patient have difficulty seeing, even when wearing glasses/contacts?: Yes (legally blind) Does the patient have difficulty concentrating, remembering, or making decisions?: No Patient able to express need for assistance with ADLs?: No Does the patient have difficulty dressing or bathing?: No Independently performs ADLs?: Yes (appropriate for developmental age) Does the patient have difficulty walking or climbing stairs?: No Weakness of Legs: None Weakness of Arms/Hands: None  Permission Sought/Granted   Permission granted to share information with : Yes, Verbal Permission Granted  Share Information with NAME: [name]  Permission granted to share info w AGENCY: [facility]        Emotional Assessment Appearance:: Appears stated age Attitude/Demeanor/Rapport: Engaged Affect (typically observed): Accepting Orientation: : Oriented to Place, Oriented to  Time, Oriented to Situation, Oriented to Self Alcohol / Substance Use: Not Applicable Psych Involvement: No (comment)  Admission diagnosis:  Acute bronchitis [J20.9] Pulmonary nodules [R91.8] Acute cystitis without hematuria [N30.00] Acute respiratory failure with hypoxia (HCC) [J96.01] Acute bronchitis, unspecified organism [J20.9] Patient Active Problem List   Diagnosis Date Noted   Acute asthma exacerbation 01/09/2022   Anemia  01/09/2022   AKI (acute kidney injury) ([place]) 01/09/2022   Atrial fibrillation with rapid ventricular response ([place]) 07/27/2020   Vomiting 01/30/2014   UTI (urinary  tract infection) 01/30/2014   Dehydration 01/30/2014   Blood in the stool 01/30/2014   Abnormal ECG 01/30/2014   OSA (obstructive sleep apnea) 10/12/2013   Drug-induced constipation 08/19/2013   Catheter-associated urinary tract infection ([place]) 08/19/2013   Acute hypoxemic respiratory failure ([place]) 08/19/2013   Hip fracture requiring operative repair ([place]) 08/19/2013   Hypokalemia 08/18/2013   Acute blood loss anemia 08/18/2013   CAP (community acquired pneumonia) 08/18/2013   Left displaced femoral neck fracture ([place]) 08/15/2013   Hyperlipidemia 08/15/2013   Chest pain 04/18/2013   Abdominal pain 04/06/2013   Acute cholangitis 04/06/2013   SIRS (systemic inflammatory response syndrome) ([place]) 04/06/2013   Common bile duct dilatation 04/06/2013   Transaminitis 04/06/2013   Cardiac pacemaker in situ 04/06/2013   Pancreatitis, acute 04/06/2013   Encounter for therapeutic drug monitoring 02/25/2013   Asymptomatic PVCs    Orthostatic hypotension 12/16/2012   Extrinsic asthma, unspecified 02/27/2009   DYSLIPIDEMIA 07/09/2007   Essential hypertension 07/09/2007   Chronic atrial fibrillation ([place]) 07/09/2007   BRADYCARDIA-TACHYCARDIA SYNDROME 07/09/2007   PCP:  Anna Neer, MD Pharmacy:   [facility] [place], [place] [address] Phone: [phone] Fax: [phone]  [facility] [phone] - [place], [place] - [address] AT [address] [place] [address] Phone: [phone] Fax: [phone]  [facility] [place], [place] [address] Phone: [phone] Fax: [phone]  [facility] [place], [place] AT [address] [place] [address] Phone: [phone] Fax:  [phone]     Social Determinants of Health (SDOH) Social History: SDOH Screenings   Food Insecurity: No Food Insecurity (01/09/2022)  Housing: Low Risk  (01/09/2022)  Transportation Needs: No Transportation Needs (01/09/2022)  Utilities: Not At Risk (01/09/2022)  Tobacco Use: Low Risk  (01/08/2022)   SDOH Interventions:     Readmission Risk Interventions     No data to display

## 2022-01-12 DIAGNOSIS — J9601 Acute respiratory failure with hypoxia: Secondary | ICD-10-CM | POA: Diagnosis not present

## 2022-01-12 LAB — BASIC METABOLIC PANEL
Anion gap: 6 (ref 5–15)
BUN: 14 mg/dL (ref 8–23)
CO2: 31 mmol/L (ref 22–32)
Calcium: 8.8 mg/dL — ABNORMAL LOW (ref 8.9–10.3)
Chloride: 100 mmol/L (ref 98–111)
Creatinine, Ser: 0.84 mg/dL (ref 0.44–1.00)
GFR, Estimated: >60 mL/min (ref >60)
Glucose, Bld: 129 mg/dL — ABNORMAL HIGH (ref 70–99)
Potassium: 3.4 mmol/L — ABNORMAL LOW (ref 3.5–5.1)
Sodium: 137 mmol/L (ref 135–145)

## 2022-01-12 LAB — URINE CULTURE

## 2022-01-12 MED ORDER — SENNOSIDES-DOCUSATE SODIUM 8.6-50 MG PO TABS
2.0000 | ORAL_TABLET | Freq: Once | ORAL | Status: AC
  Administered 2022-01-12: 2 via ORAL
  Filled 2022-01-12: qty 2

## 2022-01-12 MED ORDER — FUROSEMIDE 10 MG/ML IJ SOLN
40.0000 mg | Freq: Once | INTRAMUSCULAR | Status: AC
  Administered 2022-01-12: 40 mg via INTRAVENOUS
  Filled 2022-01-12: qty 4

## 2022-01-12 MED ORDER — POTASSIUM CHLORIDE CRYS ER 20 MEQ PO TBCR
40.0000 meq | EXTENDED_RELEASE_TABLET | Freq: Once | ORAL | Status: AC
  Administered 2022-01-12: 40 meq via ORAL
  Filled 2022-01-12: qty 2

## 2022-01-12 NOTE — Progress Notes (Signed)
PROGRESS NOTE  Anna Farrell TKP:[id number] DOB: 1927-03-27 DOA: 01/08/2022 PCP: [name], MD   LOS: 3 days   Brief Narrative / Interim history: 89 year old female with asthma, anxiety, chronic A-fib on Xarelto, tachybradycardia syndrome status post pacemaker, OSA, hypothyroidism, HTN, HLD who comes into the hospital with low-grade fevers and URI type symptoms for the past 2 weeks, since past Christmas.  She is also been having dysuria.  In the ED she was found to be hypoxic requiring 2 L supplemental oxygen, had a white count of 17.7 and ABG with a CT angiogram showed bibasilar parenchymal opacities suspicious for possible acute infiltrates.  UA was also concerning for an infection.  She was placed on Levaquin and admitted to the hospital, and also on steroids due to faint wheezing on admission  Subjective / 24h Interval events: Over all improved.  No chest pain, no shortness of breath at rest.  Assesement and Plan: Principal Problem:   Acute hypoxemic respiratory failure (HCC) Active Problems:   Hypokalemia   CAP (community acquired pneumonia)   UTI (urinary tract infection)   Acute asthma exacerbation   Anemia   AKI (acute kidney injury) ([place])   Principal problem Acute hypoxic respiratory failure due to acute asthma exacerbation and community-acquired pneumonia -based on the presence of wheezing, leukocytosis, and imaging concerning for pneumonia.  She was placed on admission on steroids as well as Levaquin, continue, today would be the last day for her to finish 6-day course.  White count improving, now normalized.  Continue supportive care with breathing treatments, continue supplemental oxygen to maintain sats above 90% and wean off to room air as tolerated -PT consulted, recommending home health PT -Received Lasix x 1 yesterday, repeat today given elevated BNP.  Attempt to wean off to room air  Active problems UTI-she has symptoms, continue Levaquin, urine cultures with  multiple species, not 1 predominant  Anemia of chronic disease-hemoglobin overall stable, no bleeding  Hypokalemia-repleted, potassium normal at 3.8 this morning  Acute kidney injury-resolved, creatinine returned to normal after IV fluids in the ED.  Continue to hold home lisinopril.  Lasix again today  Chronic diastolic CHF-clinically appears euvolemic, BNP slightly elevated that she is receiving Lasix, today day #2 of diuresis.  Repeat 2D shows an EF of 65-70%, normal LVEF, no WMA, mild LVH.  RV was normal   Pulmonary nodules - CT angiogram chest showing few tiny lung nodules measuring up to 5 mm.  She does not have history of cigarette smoking.  Per radiologist, no follow-up needed if patient is low-risk (and has no known or suspected primary neoplasm). Non-contrast chest CT can be considered in 12 months if patient is high-risk.    Anxiety -Continue home Xanax as needed   Chronic A-fib -Continue Xarelto, blood pressure stable this morning, continue home metoprolol  Tachy-brady syndrome status post pacemaker -Cardiac monitoring   Hypertension -continue metoprolol   GERD -Continue Pepcid as needed   Chronic pain -Continue tramadol as needed  Scheduled Meds:  latanoprost  1 drop Right Eye QPM   metoprolol succinate  100 mg Oral Daily   predniSONE  40 mg Oral Daily   Rivaroxaban  15 mg Oral Q supper   Continuous Infusions:  levofloxacin (LEVAQUIN) IV     PRN Meds:.acetaminophen **OR** acetaminophen, albuterol, ALPRAZolam, famotidine, guaiFENesin-dextromethorphan, phenol, traMADol  Current Outpatient Medications  Medication Instructions   acetaminophen (TYLENOL) 500 mg, Oral, Every 6 hours PRN   ALPRAZolam (XANAX) 0.25 mg, Oral, 3 times daily PRN  amLODipine (NORVASC) 5 MG tablet TAKE 1 TABLET(5 MG) BY MOUTH DAILY   benzonatate (TESSALON) 100 mg, Oral, Every 8 hours   Budeson-Glycopyrrol-Formoterol (BREZTRI AEROSPHERE) 160-9-4.8 MCG/ACT AERO Inhalation, Got sample from dr  office, uses as needed   famotidine (PEPCID) 20 mg, Oral, As needed   latanoprost (XALATAN) 0.005 % ophthalmic solution 1 drop, Right Eye, Every evening   levofloxacin (LEVAQUIN) 250 mg, Oral, Daily   lisinopril (ZESTRIL) 20 mg, Oral, Daily   metoprolol succinate (TOPROL-XL) 100 mg, Oral, Daily, Take with or immediately following a meal.    Multiple Vitamins-Minerals (EYE VITAMINS PO) 1 tablet, Oral, 2 times daily, preservision   ondansetron (ZOFRAN) 8 mg, Oral, Daily PRN   pravastatin (PRAVACHOL) 40 mg, Oral, Daily, Once daily   predniSONE (DELTASONE) 40 mg, Oral, Daily   Rivaroxaban (XARELTO) 15 MG TABS tablet TAKE 1 TABLET(15 MG) BY MOUTH DAILY WITH SUPPER   traMADol (ULTRAM) 100 mg, Oral, Every 6 hours PRN    Diet Orders (From admission, onward)     Start     Ordered   01/09/22 2049  Diet Heart Room service appropriate? Yes; Fluid consistency: Thin  Diet effective now       Question Answer Comment  Room service appropriate? Yes   Fluid consistency: Thin      01/09/22 2051            DVT prophylaxis:  Rivaroxaban (XARELTO) tablet 15 mg   Lab Results  Component Value Date   PLT 290 01/11/2022      Code Status: DNR  Family Communication: Daughter present at bedside  Status is: Inpatient  Remains inpatient appropriate because: severity of illness  Level of care: Telemetry Medical  Consultants:  none  Objective: Vitals:   01/11/22 0807 01/11/22 1636 01/11/22 2002 01/12/22 0439  BP: (!) 137/93 (!) 153/99 127/81 136/77  Pulse: 95 91 77 77  Resp: '18 18 16 18  '$ Temp: 98.4 F (36.9 C) 98.3 F (36.8 C) 98.3 F (36.8 C) 98.7 F (37.1 C)  TempSrc: Oral Oral Oral Oral  SpO2: 98% 97% 98% 95%  Weight:      Height:        Intake/Output Summary (Last 24 hours) at 01/12/2022 0859 Last data filed at 01/12/2022 0221 Gross per 24 hour  Intake --  Output 500 ml  Net -500 ml    Wt Readings from Last 3 Encounters:  01/08/22 61.2 kg  08/10/21 61.2 kg  01/31/21 61.2  kg    Examination:  Constitutional: NAD Eyes: lids and conjunctivae normal, no scleral icterus ENMT: mmm Neck: normal, supple Respiratory: clear to auscultation bilaterally, no wheezing, no crackles. Normal respiratory effort. Cardiovascular: Regular rate and rhythm, no murmurs / rubs / gallops.  Abdomen: soft, no distention, no tenderness. Bowel sounds positive.  Skin: no rashes Neurologic: no focal deficits, equal strength   Data Reviewed: I have independently reviewed following labs and imaging studies   CBC Recent Labs  Lab 01/08/22 1201 01/09/22 0734 01/10/22 0236 01/11/22 0420  WBC 17.7* 13.1* 7.6 9.8  HGB 11.7* 9.9* 9.8* 10.0*  HCT 39.2 32.6* 33.7* 33.6*  PLT 355 239 217 290  MCV 80.7 79.5* 82.6 79.8*  MCH 24.1* 24.1* 24.0* 23.8*  MCHC 29.8* 30.4 29.1* 29.8*  RDW 17.0* 16.9* 16.9* 16.9*     Recent Labs  Lab 01/08/22 1201 01/09/22 0734 01/10/22 0236 01/11/22 0420 01/12/22 0259  NA 140 139 134* 137 137  K 3.4* 3.2* 4.3 3.8 3.4*  CL 98 99  100 101 100  CO2 '29 28 27 29 31  '$ GLUCOSE 197* 104* 136* 138* 129*  BUN '13 20 22 16 14  '$ CREATININE 0.85 1.21* 0.88 0.85 0.84  CALCIUM 9.7 9.2 8.9 9.4 8.8*  AST 15  --   --  21  --   ALT 7  --   --  13  --   ALKPHOS 74  --   --  56  --   BILITOT 0.9  --   --  0.6  --   ALBUMIN 4.0  --   --  2.7*  --   MG  --   --  1.9 1.9  --   BNP 419.4*  --  261.3*  --   --      ------------------------------------------------------------------------------------------------------------------ No results for input(s): "CHOL", "HDL", "LDLCALC", "TRIG", "CHOLHDL", "LDLDIRECT" in the last 72 hours.  Lab Results  Component Value Date   HGBA1C 6.0 (H) 01/31/2014   ------------------------------------------------------------------------------------------------------------------ No results for input(s): "TSH", "T4TOTAL", "T3FREE", "THYROIDAB" in the last 72 hours.  Invalid input(s): "FREET3"  Cardiac Enzymes No results for  input(s): "CKMB", "TROPONINI", "MYOGLOBIN" in the last 168 hours.  Invalid input(s): "CK" ------------------------------------------------------------------------------------------------------------------    Component Value Date/Time   BNP 261.3 (H) 01/10/2022 0236    CBG: No results for input(s): "GLUCAP" in the last 168 hours.  Recent Results (from the past 240 hour(s))  Resp panel by RT-PCR (RSV, Flu A&B, Covid) Anterior Nasal Swab     Status: None   Collection Time: 01/08/22 12:01 PM   Specimen: Anterior Nasal Swab  Result Value Ref Range Status   SARS Coronavirus 2 by RT PCR NEGATIVE NEGATIVE Final    Comment: (NOTE) SARS-CoV-2 target nucleic acids are NOT DETECTED.  The SARS-CoV-2 RNA is generally detectable in upper respiratory specimens during the acute phase of infection. The lowest concentration of SARS-CoV-2 viral copies this assay can detect is 138 copies/mL. A negative result does not preclude SARS-Cov-2 infection and should not be used as the sole basis for treatment or other patient management decisions. A negative result may occur with  improper specimen collection/handling, submission of specimen other than nasopharyngeal swab, presence of viral mutation(s) within the areas targeted by this assay, and inadequate number of viral copies(<138 copies/mL). A negative result must be combined with clinical observations, patient history, and epidemiological information. The expected result is Negative.  Fact Sheet for Patients:  EntrepreneurPulse.com.au  Fact Sheet for Healthcare Providers:  IncredibleEmployment.be  This test is no t yet approved or cleared by the [place] FDA and  has been authorized for detection and/or diagnosis of SARS-CoV-2 by FDA under an Emergency Use Authorization (EUA). This EUA will remain  in effect (meaning this test can be used) for the duration of the COVID-19 declaration under Section  564(b)(1) of the Act, 21 U.S.C.section 360bbb-3(b)(1), unless the authorization is terminated  or revoked sooner.       Influenza A by PCR NEGATIVE NEGATIVE Final   Influenza B by PCR NEGATIVE NEGATIVE Final    Comment: (NOTE) The Xpert Xpress SARS-CoV-2/FLU/RSV plus assay is intended as an aid in the diagnosis of influenza from Nasopharyngeal swab specimens and should not be used as a sole basis for treatment. Nasal washings and aspirates are unacceptable for Xpert Xpress SARS-CoV-2/FLU/RSV testing.  Fact Sheet for Patients: EntrepreneurPulse.com.au  Fact Sheet for Healthcare Providers: IncredibleEmployment.be  This test is not yet approved or cleared by the [place] FDA and has been authorized for detection and/or diagnosis of  SARS-CoV-2 by FDA under an Emergency Use Authorization (EUA). This EUA will remain in effect (meaning this test can be used) for the duration of the COVID-19 declaration under Section 564(b)(1) of the Act, 21 U.S.C. section 360bbb-3(b)(1), unless the authorization is terminated or revoked.     Resp Syncytial Virus by PCR NEGATIVE NEGATIVE Final    Comment: (NOTE) Fact Sheet for Patients: EntrepreneurPulse.com.au  Fact Sheet for Healthcare Providers: IncredibleEmployment.be  This test is not yet approved or cleared by the [place] FDA and has been authorized for detection and/or diagnosis of SARS-CoV-2 by FDA under an Emergency Use Authorization (EUA). This EUA will remain in effect (meaning this test can be used) for the duration of the COVID-19 declaration under Section 564(b)(1) of the Act, 21 U.S.C. section 360bbb-3(b)(1), unless the authorization is terminated or revoked.  Performed at [facility], [address], [place], [place] [address]   Urine Culture     Status: Abnormal   Collection Time: 01/09/22  8:49 PM   Specimen: Urine, Clean  Catch  Result Value Ref Range Status   Specimen Description URINE, CLEAN CATCH  Final   Special Requests   Final    NONE Performed at [facility], [place] [address], [place], [place] [address]    Culture MULTIPLE SPECIES PRESENT, SUGGEST RECOLLECTION (A)  Final   Report Status 01/12/2022 FINAL  Final  Respiratory (~20 pathogens) panel by PCR     Status: None   Collection Time: 01/10/22  6:20 AM   Specimen: Nasopharyngeal Swab; Respiratory  Result Value Ref Range Status   Adenovirus NOT DETECTED NOT DETECTED Final   Coronavirus 229E NOT DETECTED NOT DETECTED Final    Comment: (NOTE) The Coronavirus on the Respiratory Panel, DOES NOT test for the novel  Coronavirus (2019 nCoV)    Coronavirus HKU1 NOT DETECTED NOT DETECTED Final   Coronavirus NL63 NOT DETECTED NOT DETECTED Final   Coronavirus OC43 NOT DETECTED NOT DETECTED Final   Metapneumovirus NOT DETECTED NOT DETECTED Final   Rhinovirus / Enterovirus NOT DETECTED NOT DETECTED Final   Influenza A NOT DETECTED NOT DETECTED Final   Influenza B NOT DETECTED NOT DETECTED Final   Parainfluenza Virus 1 NOT DETECTED NOT DETECTED Final   Parainfluenza Virus 2 NOT DETECTED NOT DETECTED Final   Parainfluenza Virus 3 NOT DETECTED NOT DETECTED Final   Parainfluenza Virus 4 NOT DETECTED NOT DETECTED Final   Respiratory Syncytial Virus NOT DETECTED NOT DETECTED Final   Bordetella pertussis NOT DETECTED NOT DETECTED Final   Bordetella Parapertussis NOT DETECTED NOT DETECTED Final   Chlamydophila pneumoniae NOT DETECTED NOT DETECTED Final   Mycoplasma pneumoniae NOT DETECTED NOT DETECTED Final    Comment: Performed at [facility], [place] [address], [place], [place] [address]     Radiology Studies: No results found.   Marzetta Board, MD, PhD [facility]  Between 7 am - 7 pm I am available, please contact me via Amion (for emergencies) or Securechat (non urgent messages)  Between 7 pm - 7 am I am not available, please  contact night coverage MD/APP via Amion

## 2022-01-12 NOTE — Progress Notes (Signed)
SATURATION QUALIFICATIONS: (This note is used to comply with regulatory documentation for home oxygen)  Patient Saturations on 1 Liter at Rest = 95%  Patient Saturations on Room Air while Ambulating = 77%  Patient Saturations on 1 Liters of oxygen while Ambulating = 86%  Please briefly explain why patient needs home oxygen: pt is very weak need to sit before ambulating back to room. Pt oxygen level increase with 2 liters to 93%,pt in chair.

## 2022-01-13 DIAGNOSIS — J9601 Acute respiratory failure with hypoxia: Secondary | ICD-10-CM | POA: Diagnosis not present

## 2022-01-13 LAB — BASIC METABOLIC PANEL
Anion gap: 11 (ref 5–15)
BUN: 18 mg/dL (ref 8–23)
CO2: 29 mmol/L (ref 22–32)
Calcium: 9.1 mg/dL (ref 8.9–10.3)
Chloride: 95 mmol/L — ABNORMAL LOW (ref 98–111)
Creatinine, Ser: 0.94 mg/dL (ref 0.44–1.00)
GFR, Estimated: 57 mL/min — ABNORMAL LOW (ref >60)
Glucose, Bld: 127 mg/dL — ABNORMAL HIGH (ref 70–99)
Potassium: 3.6 mmol/L (ref 3.5–5.1)
Sodium: 135 mmol/L (ref 135–145)

## 2022-01-13 LAB — MAGNESIUM: Magnesium: 1.7 mg/dL (ref 1.7–2.4)

## 2022-01-13 MED ORDER — SODIUM CHLORIDE 0.9 % IV BOLUS
500.0000 mL | Freq: Once | INTRAVENOUS | Status: AC
  Administered 2022-01-13: 500 mL via INTRAVENOUS

## 2022-01-13 NOTE — Progress Notes (Signed)
Mobility Specialist Progress Note   01/13/22 0940  Orthostatic Sitting  BP- Sitting 120/73  Pulse- Sitting 122  Orthostatic Standing at 0 minutes  BP- Standing at 0 minutes 99/79  Pulse- Standing at 0 minutes 133  Mobility  Activity Stood at bedside;Repositioned in chair  Level of Assistance Standby assist, set-up cues, supervision of patient - no hands on  Assistive Device Front wheel walker  Range of Motion/Exercises Active;All extremities  Activity Response Tolerated poorly;RN notified   Pre: SpO2 99% RA  During: SpO2 95% RA (Sedentary) Post :SpO2 97% RA (Sedentary)  Patient received in recliner and eager to participate. Daughter, RN, and MD present during session. Goal of session was to obtain a ambulatory oxygen saturation. Attempted ambulation but unable to complete secondary to increasing dizziness with positional changes. Orthostatic VS obtained and discovered to be hypotensive. Symptoms improved with seated rest recovery. Terminated ambulation due to S/sx and will return later to obtain pulmonary oxygen saturation qualification. Was left in recliner with all needs met, call bell in reach.   Anna Farrell, BS EXP Mobility Specialist Please contact via SecureChat or Rehab office at [phone]

## 2022-01-13 NOTE — Plan of Care (Signed)
  Problem: Clinical Measurements: Goal: Respiratory complications will improve Outcome: Not Progressing   Problem: Elimination: Goal: Will not experience complications related to bowel motility Outcome: Not Progressing

## 2022-01-13 NOTE — Progress Notes (Signed)
PROGRESS NOTE  Anna Farrell FMB:[id number] DOB: February 23, 1927 DOA: 01/08/2022 PCP: [name], MD   LOS: 4 days   Brief Narrative / Interim history: 89 year old female with asthma, anxiety, chronic A-fib on Xarelto, tachybradycardia syndrome status post pacemaker, OSA, hypothyroidism, HTN, HLD who comes into the hospital with low-grade fevers and URI type symptoms for the past 2 weeks, since past Christmas.  She is also been having dysuria.  In the ED she was found to be hypoxic requiring 2 L supplemental oxygen, had a white count of 17.7 and ABG with a CT angiogram showed bibasilar parenchymal opacities suspicious for possible acute infiltrates.  UA was also concerning for an infection.  She was placed on Levaquin and admitted to the hospital, and also on steroids due to faint wheezing on admission  Subjective / 24h Interval events: Feels weak.  Very dizzy when standing.  Assesement and Plan: Principal Problem:   Acute hypoxemic respiratory failure (HCC) Active Problems:   Hypokalemia   CAP (community acquired pneumonia)   UTI (urinary tract infection)   Acute asthma exacerbation   Anemia   AKI (acute kidney injury) ([place])   Principal problem Acute hypoxic respiratory failure due to acute asthma exacerbation and community-acquired pneumonia -based on the presence of wheezing, leukocytosis, and imaging concerning for pneumonia.  She was placed on admission on steroids as well as Levaquin, status post a complete course.  White count improving, now normalized.  Continue supportive care with breathing treatments, continue supplemental oxygen to maintain sats above 90% and wean off to room air as tolerated -PT consulted, recommending home health PT -Received Lasix x 2 days due to elevated BNP, but orthostatic today.  Probably overshoot, will give back 500 cc  Active problems UTI-she has symptoms, continue Levaquin, urine cultures with multiple species, not 1 predominant  Anemia of  chronic disease-hemoglobin overall stable, no bleeding  Hypokalemia-potassium 3.6 this morning  Acute kidney injury-resolved, creatinine returned to normal after IV fluids in the ED.  Continue to hold home lisinopril.  Overall renal function is stable  Chronic diastolic CHF-clinically appears euvolemic, BNP slightly elevated that she is receiving Lasix, today day #2 of diuresis.  Repeat 2D shows an EF of 65-70%, normal LVEF, no WMA, mild LVH.  RV was normal   Pulmonary nodules - CT angiogram chest showing few tiny lung nodules measuring up to 5 mm.  She does not have history of cigarette smoking.  Per radiologist, no follow-up needed if patient is low-risk (and has no known or suspected primary neoplasm). Non-contrast chest CT can be considered in 12 months if patient is high-risk.    Anxiety -Continue home Xanax as needed   Chronic A-fib -Continue Xarelto, blood pressure stable this morning, continue home metoprolol  Tachy-brady syndrome status post pacemaker -Cardiac monitoring   Hypertension -continue metoprolol   GERD -Continue Pepcid as needed   Chronic pain -Continue tramadol as needed  Scheduled Meds:  latanoprost  1 drop Right Eye QPM   metoprolol succinate  100 mg Oral Daily   Rivaroxaban  15 mg Oral Q supper   Continuous Infusions:  sodium chloride     PRN Meds:.acetaminophen **OR** acetaminophen, albuterol, ALPRAZolam, famotidine, guaiFENesin-dextromethorphan, phenol, traMADol  Current Outpatient Medications  Medication Instructions   acetaminophen (TYLENOL) 500 mg, Oral, Every 6 hours PRN   ALPRAZolam (XANAX) 0.25 mg, Oral, 3 times daily PRN   amLODipine (NORVASC) 5 MG tablet TAKE 1 TABLET(5 MG) BY MOUTH DAILY   benzonatate (TESSALON) 100 mg, Oral, Every 8  hours   Budeson-Glycopyrrol-Formoterol (BREZTRI AEROSPHERE) 160-9-4.8 MCG/ACT AERO Inhalation, Got sample from dr office, uses as needed   famotidine (PEPCID) 20 mg, Oral, As needed   latanoprost (XALATAN) 0.005  % ophthalmic solution 1 drop, Right Eye, Every evening   levofloxacin (LEVAQUIN) 250 mg, Oral, Daily   lisinopril (ZESTRIL) 20 mg, Oral, Daily   metoprolol succinate (TOPROL-XL) 100 mg, Oral, Daily, Take with or immediately following a meal.    Multiple Vitamins-Minerals (EYE VITAMINS PO) 1 tablet, Oral, 2 times daily, preservision   ondansetron (ZOFRAN) 8 mg, Oral, Daily PRN   pravastatin (PRAVACHOL) 40 mg, Oral, Daily, Once daily   predniSONE (DELTASONE) 40 mg, Oral, Daily   Rivaroxaban (XARELTO) 15 MG TABS tablet TAKE 1 TABLET(15 MG) BY MOUTH DAILY WITH SUPPER   traMADol (ULTRAM) 100 mg, Oral, Every 6 hours PRN    Diet Orders (From admission, onward)     Start     Ordered   01/09/22 2049  Diet Heart Room service appropriate? Yes; Fluid consistency: Thin  Diet effective now       Question Answer Comment  Room service appropriate? Yes   Fluid consistency: Thin      01/09/22 2051            DVT prophylaxis:  Rivaroxaban (XARELTO) tablet 15 mg   Lab Results  Component Value Date   PLT 290 01/11/2022      Code Status: DNR  Family Communication: Daughter present at bedside  Status is: Inpatient  Remains inpatient appropriate because: severity of illness  Level of care: Telemetry Medical  Consultants:  none  Objective: Vitals:   01/13/22 0448 01/13/22 0755 01/13/22 1215 01/13/22 1505  BP: (!) 156/89 129/87 108/78 110/79  Pulse: 72 74 78 88  Resp: '16 16  16  '$ Temp: 98.3 F (36.8 C) 97.7 F (36.5 C)  98 F (36.7 C)  TempSrc: Oral Oral  Oral  SpO2: 97% 96%  99%  Weight:      Height:        Intake/Output Summary (Last 24 hours) at 01/13/2022 1520 Last data filed at 01/13/2022 1251 Gross per 24 hour  Intake --  Output 300 ml  Net -300 ml    Wt Readings from Last 3 Encounters:  01/08/22 61.2 kg  08/10/21 61.2 kg  01/31/21 61.2 kg    Examination:  Constitutional: NAD Eyes: lids and conjunctivae normal, no scleral icterus ENMT: mmm Neck: normal,  supple Respiratory: clear to auscultation bilaterally, no wheezing, no crackles. Normal respiratory effort.  Cardiovascular: Regular rate and rhythm, no murmurs / rubs / gallops.  Abdomen: soft, no distention, no tenderness. Bowel sounds positive.  Skin: no rashes Neurologic: no focal deficits, equal strength   Data Reviewed: I have independently reviewed following labs and imaging studies   CBC Recent Labs  Lab 01/08/22 1201 01/09/22 0734 01/10/22 0236 01/11/22 0420  WBC 17.7* 13.1* 7.6 9.8  HGB 11.7* 9.9* 9.8* 10.0*  HCT 39.2 32.6* 33.7* 33.6*  PLT 355 239 217 290  MCV 80.7 79.5* 82.6 79.8*  MCH 24.1* 24.1* 24.0* 23.8*  MCHC 29.8* 30.4 29.1* 29.8*  RDW 17.0* 16.9* 16.9* 16.9*     Recent Labs  Lab 01/08/22 1201 01/09/22 0734 01/10/22 0236 01/11/22 0420 01/12/22 0259 01/13/22 0216  NA 140 139 134* 137 137 135  K 3.4* 3.2* 4.3 3.8 3.4* 3.6  CL 98 99 100 101 100 95*  CO2 '29 28 27 29 31 29  '$ GLUCOSE 197* 104* 136* 138* 129* 127*  BUN '13 20 22 16 14 18  '$ CREATININE 0.85 1.21* 0.88 0.85 0.84 0.94  CALCIUM 9.7 9.2 8.9 9.4 8.8* 9.1  AST 15  --   --  21  --   --   ALT 7  --   --  13  --   --   ALKPHOS 74  --   --  56  --   --   BILITOT 0.9  --   --  0.6  --   --   ALBUMIN 4.0  --   --  2.7*  --   --   MG  --   --  1.9 1.9  --  1.7  BNP 419.4*  --  261.3*  --   --   --      ------------------------------------------------------------------------------------------------------------------ No results for input(s): "CHOL", "HDL", "LDLCALC", "TRIG", "CHOLHDL", "LDLDIRECT" in the last 72 hours.  Lab Results  Component Value Date   HGBA1C 6.0 (H) 01/31/2014   ------------------------------------------------------------------------------------------------------------------ No results for input(s): "TSH", "T4TOTAL", "T3FREE", "THYROIDAB" in the last 72 hours.  Invalid input(s): "FREET3"  Cardiac Enzymes No results for input(s): "CKMB", "TROPONINI", "MYOGLOBIN" in the  last 168 hours.  Invalid input(s): "CK" ------------------------------------------------------------------------------------------------------------------    Component Value Date/Time   BNP 261.3 (H) 01/10/2022 0236    CBG: No results for input(s): "GLUCAP" in the last 168 hours.  Recent Results (from the past 240 hour(s))  Resp panel by RT-PCR (RSV, Flu A&B, Covid) Anterior Nasal Swab     Status: None   Collection Time: 01/08/22 12:01 PM   Specimen: Anterior Nasal Swab  Result Value Ref Range Status   SARS Coronavirus 2 by RT PCR NEGATIVE NEGATIVE Final    Comment: (NOTE) SARS-CoV-2 target nucleic acids are NOT DETECTED.  The SARS-CoV-2 RNA is generally detectable in upper respiratory specimens during the acute phase of infection. The lowest concentration of SARS-CoV-2 viral copies this assay can detect is 138 copies/mL. A negative result does not preclude SARS-Cov-2 infection and should not be used as the sole basis for treatment or other patient management decisions. A negative result may occur with  improper specimen collection/handling, submission of specimen other than nasopharyngeal swab, presence of viral mutation(s) within the areas targeted by this assay, and inadequate number of viral copies(<138 copies/mL). A negative result must be combined with clinical observations, patient history, and epidemiological information. The expected result is Negative.  Fact Sheet for Patients:  EntrepreneurPulse.com.au  Fact Sheet for Healthcare Providers:  IncredibleEmployment.be  This test is no t yet approved or cleared by the [place] FDA and  has been authorized for detection and/or diagnosis of SARS-CoV-2 by FDA under an Emergency Use Authorization (EUA). This EUA will remain  in effect (meaning this test can be used) for the duration of the COVID-19 declaration under Section 564(b)(1) of the Act, 21 U.S.C.section 360bbb-3(b)(1),  unless the authorization is terminated  or revoked sooner.       Influenza A by PCR NEGATIVE NEGATIVE Final   Influenza B by PCR NEGATIVE NEGATIVE Final    Comment: (NOTE) The Xpert Xpress SARS-CoV-2/FLU/RSV plus assay is intended as an aid in the diagnosis of influenza from Nasopharyngeal swab specimens and should not be used as a sole basis for treatment. Nasal washings and aspirates are unacceptable for Xpert Xpress SARS-CoV-2/FLU/RSV testing.  Fact Sheet for Patients: EntrepreneurPulse.com.au  Fact Sheet for Healthcare Providers: IncredibleEmployment.be  This test is not yet approved or cleared by the [place] and has been authorized for  detection and/or diagnosis of SARS-CoV-2 by FDA under an Emergency Use Authorization (EUA). This EUA will remain in effect (meaning this test can be used) for the duration of the COVID-19 declaration under Section 564(b)(1) of the Act, 21 U.S.C. section 360bbb-3(b)(1), unless the authorization is terminated or revoked.     Resp Syncytial Virus by PCR NEGATIVE NEGATIVE Final    Comment: (NOTE) Fact Sheet for Patients: EntrepreneurPulse.com.au  Fact Sheet for Healthcare Providers: IncredibleEmployment.be  This test is not yet approved or cleared by the [place] FDA and has been authorized for detection and/or diagnosis of SARS-CoV-2 by FDA under an Emergency Use Authorization (EUA). This EUA will remain in effect (meaning this test can be used) for the duration of the COVID-19 declaration under Section 564(b)(1) of the Act, 21 U.S.C. section 360bbb-3(b)(1), unless the authorization is terminated or revoked.  Performed at [facility], [address] [place], [place] [address]   Urine Culture     Status: Abnormal   Collection Time: 01/09/22  8:49 PM   Specimen: Urine, Clean Catch  Result Value Ref Range Status   Specimen  Description URINE, CLEAN CATCH  Final   Special Requests   Final    NONE Performed at [facility], [place] [address], [place], [place] [address]    Culture MULTIPLE SPECIES PRESENT, SUGGEST RECOLLECTION (A)  Final   Report Status 01/12/2022 FINAL  Final  Respiratory (~20 pathogens) panel by PCR     Status: None   Collection Time: 01/10/22  6:20 AM   Specimen: Nasopharyngeal Swab; Respiratory  Result Value Ref Range Status   Adenovirus NOT DETECTED NOT DETECTED Final   Coronavirus 229E NOT DETECTED NOT DETECTED Final    Comment: (NOTE) The Coronavirus on the Respiratory Panel, DOES NOT test for the novel  Coronavirus (2019 nCoV)    Coronavirus HKU1 NOT DETECTED NOT DETECTED Final   Coronavirus NL63 NOT DETECTED NOT DETECTED Final   Coronavirus OC43 NOT DETECTED NOT DETECTED Final   Metapneumovirus NOT DETECTED NOT DETECTED Final   Rhinovirus / Enterovirus NOT DETECTED NOT DETECTED Final   Influenza A NOT DETECTED NOT DETECTED Final   Influenza B NOT DETECTED NOT DETECTED Final   Parainfluenza Virus 1 NOT DETECTED NOT DETECTED Final   Parainfluenza Virus 2 NOT DETECTED NOT DETECTED Final   Parainfluenza Virus 3 NOT DETECTED NOT DETECTED Final   Parainfluenza Virus 4 NOT DETECTED NOT DETECTED Final   Respiratory Syncytial Virus NOT DETECTED NOT DETECTED Final   Bordetella pertussis NOT DETECTED NOT DETECTED Final   Bordetella Parapertussis NOT DETECTED NOT DETECTED Final   Chlamydophila pneumoniae NOT DETECTED NOT DETECTED Final   Mycoplasma pneumoniae NOT DETECTED NOT DETECTED Final    Comment: Performed at [facility], [place] [address], [place],  [address]     Radiology Studies: No results found.   Marzetta Board, MD, PhD [facility]  Between 7 am - 7 pm I am available, please contact me via Amion (for emergencies) or Securechat (non urgent messages)  Between 7 pm - 7 am I am not available, please contact night coverage MD/APP via Amion

## 2022-01-13 NOTE — Progress Notes (Signed)
Mobility Specialist Progress Note   01/13/22 1600  Mobility  Activity Ambulated with assistance in room (in recliner before and after ambulation)  Level of Assistance Contact guard assist, steadying assist  Assistive Device Front wheel walker  Distance Ambulated (ft) 20 ft  Range of Motion/Exercises Active;All extremities  Activity Response Tolerated well   Pre Ambulation: BP 111/78, SpO2 99% RA During Ambulation: SpO2 94% RA Post Ambulation: SpO2 99% RA  Patient received in recliner and eager to participate. Reported no dizziness with standing this session and was able to ambulate short distance in room with steady gait. Oxygen saturation maintained >94% on room air. Returned to recliner without complaint or incident. Was left with all needs met, call bell in reach.    Anna Farrell, BS EXP Mobility Specialist Please contact via SecureChat or Rehab office at [phone]

## 2022-01-13 NOTE — Progress Notes (Signed)
SATURATION QUALIFICATIONS: (This note is used to comply with regulatory documentation for home oxygen)  Patient Saturations on Room Air at Rest = 99%  Patient Saturations on Room Air while Ambulating = 94%  Patient Saturations on 0 Liters of oxygen while Ambulating = n/a  Please briefly explain why patient needs home oxygen:

## 2022-01-13 NOTE — TOC Progression Note (Addendum)
Transition of Care [facility]) - Progression Note    Patient Details  Name: Anna Farrell MRN: [id number] Date of Birth: [birth year]  [facility] CM/SW [place], RN Phone Number: 01/13/2022, 8:34 AM  Clinical Narrative:    Patient has oxygen requirement, oxygen qualifications done last night.  Spoke with daughter over phone , was in room. She is agreeable to get oxygen from anyone who works well with insurance. She would like to see if this improves so she will not need oxygen once DC.  Will see when provider comes when  the patient will discharge.   1300 attempted oxygen qualifications again, however patient experienced orthostatics, so ambulation was terminated.  Will continue to follow.  Expected Discharge Plan: [place] Barriers to Discharge: Continued Medical Work up  Expected Discharge Plan and Services   Discharge Planning Services: CM Consult Post Acute Care Choice: [place] arrangements for the past 2 months: Single Family Home                 DME Arranged: N/A DME Agency: NA       HH Arranged: RN, PT [place] Agency: [place] ([facility]) Date [place]: 01/11/22 Time [place]: [id number] Representative spoke with at [place]: [place] ([place]) Interventions SDOH Screenings   Food Insecurity: No Food Insecurity (01/09/2022)  Housing: Low Risk  (01/09/2022)  Transportation Needs: No Transportation Needs (01/09/2022)  Utilities: Not At Risk (01/09/2022)  Tobacco Use: Low Risk  (01/08/2022)    Readmission Risk Interventions     No data to display

## 2022-01-14 DIAGNOSIS — J9601 Acute respiratory failure with hypoxia: Secondary | ICD-10-CM | POA: Diagnosis not present

## 2022-01-14 LAB — BASIC METABOLIC PANEL
Anion gap: 10 (ref 5–15)
BUN: 16 mg/dL (ref 8–23)
CO2: 30 mmol/L (ref 22–32)
Calcium: 8.9 mg/dL (ref 8.9–10.3)
Chloride: 98 mmol/L (ref 98–111)
Creatinine, Ser: 0.8 mg/dL (ref 0.44–1.00)
GFR, Estimated: >60 mL/min (ref >60)
Glucose, Bld: 96 mg/dL (ref 70–99)
Potassium: 3.3 mmol/L — ABNORMAL LOW (ref 3.5–5.1)
Sodium: 138 mmol/L (ref 135–145)

## 2022-01-14 MED ORDER — GUAIFENESIN-DM 100-10 MG/5ML PO SYRP: 5.0000 mL | ORAL_SOLUTION | ORAL | 0 refills | Status: DC | PRN

## 2022-01-14 MED ORDER — POTASSIUM CHLORIDE CRYS ER 20 MEQ PO TBCR
40.0000 meq | EXTENDED_RELEASE_TABLET | Freq: Once | ORAL | Status: AC
  Administered 2022-01-14: 40 meq via ORAL
  Filled 2022-01-14: qty 2

## 2022-01-14 NOTE — Progress Notes (Signed)
SATURATION QUALIFICATIONS: (This note is used to comply with regulatory documentation for home oxygen)  Patient Saturations on Room Air at Rest = 95%  Patient Saturations on Room Air while Ambulating = 71%  Patient Saturations on 3 Liters of oxygen while Ambulating = 90%  Please briefly explain why patient needs home oxygen: Pt unable to maintain O2 sats >87% unless on 3L/min supplemental O2.   Rolinda Roan, PT, DPT Acute Rehabilitation Services Secure Chat Preferred Office: [phone]

## 2022-01-14 NOTE — Discharge Summary (Signed)
Physician Discharge Summary  Anna Farrell HQR:[id number] DOB: November 21, 1927 DOA: 01/08/2022  PCP: [name], MD  Admit date: 01/08/2022 Discharge date: 01/14/2022  Admitted From: home Disposition:  home  Recommendations for Outpatient Follow-up:  Follow up with PCP in 1-2 weeks  Home Health: PT Equipment/Devices: home O2  Discharge Condition: stable CODE STATUS: DNR Diet Orders (From admission, onward)     Start     Ordered   01/09/22 2049  Diet Heart Room service appropriate? Yes; Fluid consistency: Thin  Diet effective now       Question Answer Comment  Room service appropriate? Yes   Fluid consistency: Thin      01/09/22 2051            HPI: Per admitting MD, Anna Farrell is a 89 y.o. female with medical history significant of asthma, GERD, anxiety, chronic A-fib on Xarelto, tachy-brady syndrome status post pacemaker, OSA, RLS, hypothyroidism, hyperlipidemia, hypertension presented to [facility] ED yesterday with complaints of low-grade fevers x 3 weeks, URI symptoms, and dysuria.  In the ED, patient noted to be tachycardic, tachypneic, and oxygen saturation in the mid to high 80s.  Placed on 2 L O2.  Labs showing WBC 17.7, hemoglobin 11.7 (baseline 11.8-12.5), potassium 3.4, COVID/influenza/RSV PCR negative, BNP 419, troponin negative x 2.  UA with moderate leukocytes and microscopy showing >50 WBCs.  CT angiogram chest negative for PE but showing subtle lung base parenchymal opacities suspicious for possible acute infiltrates. Repeat labs done today showing WBC 13.1, hemoglobin 9.9, potassium 3.2, creatinine 1.2 (baseline 0.7-0.9). Patient received Tylenol, tramadol, Xanax, prednisone 40 mg, Xarelto, IV Levaquin, 1 L IV fluids, and potassium supplement in the ED.    Transferred to [facility] this evening for admission. Patient reports 3-week history of productive cough and shortness of breath.  She is not sure if she was having fevers as she has not checked her  temperature at home.  States her breathing has been worse for the past 2 days.  She is also endorsing dysuria and reports history of UTIs.  Denies chest pain.  Denies nausea, vomiting, or abdominal pain.  She uses an inhaler as needed and has never smoked cigarettes.  No other complaints.  Hospital Course / Discharge diagnoses: Principal Problem:   Acute hypoxemic respiratory failure (HCC) Active Problems:   Hypokalemia   CAP (community acquired pneumonia)   UTI (urinary tract infection)   Acute asthma exacerbation   Anemia   AKI (acute kidney injury) ([place])   Principal problem Acute hypoxic respiratory failure due to acute asthma exacerbation and community-acquired pneumonia -based on the presence of wheezing, leukocytosis, and imaging concerning for pneumonia.  She was placed on admission on steroids as well as Levaquin, status post a complete course.  White count improving, now normalized.  Continue supportive care with breathing treatments, continue supplemental oxygen to maintain sats above 90%, she continues to require O2 upon discharge   Active problems UTI-she has symptoms, continue Levaquin, urine cultures with multiple species, not 1 predominant Anemia of chronic disease-hemoglobin overall stable, no bleeding Hypokalemia-improved with repletion   Acute kidney injury-resolved Chronic diastolic CHF-clinically appears euvolemic. Repeat 2D shows an EF of 65-70%, normal LVEF, no WMA, mild LVH.  RV was normal. Euvolemic on dc Pulmonary nodules - CT angiogram chest showing few tiny lung nodules measuring up to 5 mm.  She does not have history of cigarette smoking.  Per radiologist, no follow-up needed if patient is low-risk (and has no known  or suspected primary neoplasm). Non-contrast chest CT can be considered in 12 months if patient is high-risk.  Anxiety -Continue home Xanax as needed Chronic A-fib -Continue Xarelto, home metoprolol Tachy-brady syndrome status post pacemaker -Cardiac  monitoring Hypertension -continue home medications GERD -Continue Pepcid as needed Chronic pain -Continue tramadol as needed  Sepsis ruled out   Discharge Instructions   Allergies as of 01/14/2022       Reactions   Codeine Nausea And Vomiting   Passed out   Betadine [povidone Iodine]    burning   Rocephin [ceftriaxone Sodium In Dextrose] Itching        Medication List     TAKE these medications    acetaminophen 325 MG tablet Commonly known as: TYLENOL Take 500 mg by mouth every 6 (six) hours as needed.   ALPRAZolam 0.25 MG tablet Commonly known as: XANAX Take 0.25 mg by mouth 3 (three) times daily as needed (at night for restless legs, only takes at night up to 3 tablets).   amLODipine 5 MG tablet Commonly known as: NORVASC TAKE 1 TABLET(5 MG) BY MOUTH DAILY   benzonatate 100 MG capsule Commonly known as: TESSALON Take 1 capsule (100 mg total) by mouth every 8 (eight) hours.   Breztri Aerosphere 160-9-4.8 MCG/ACT Aero Generic drug: Budeson-Glycopyrrol-Formoterol Inhale into the lungs. Got sample from dr office, uses as needed   EYE VITAMINS PO Take 1 tablet by mouth in the morning and at bedtime. preservision   famotidine 20 MG tablet Commonly known as: PEPCID Take 20 mg by mouth as needed for heartburn or indigestion.   guaiFENesin-dextromethorphan 100-10 MG/5ML syrup Commonly known as: ROBITUSSIN DM Take 5 mLs by mouth every 4 (four) hours as needed for cough.   latanoprost 0.005 % ophthalmic solution Commonly known as: XALATAN Place 1 drop into the right eye every evening.   lisinopril 40 MG tablet Commonly known as: ZESTRIL Take 20 mg by mouth daily.   metoprolol succinate 100 MG 24 hr tablet Commonly known as: TOPROL-XL Take 100 mg daily by mouth. Take with or immediately following a meal.   ondansetron 8 MG tablet Commonly known as: ZOFRAN Take 8 mg by mouth daily as needed (Car sick).   pravastatin 40 MG tablet Commonly known as:  PRAVACHOL Take 1 tablet (40 mg total) by mouth daily. Once daily   traMADol 50 MG tablet Commonly known as: ULTRAM Take 100 mg by mouth every 6 (six) hours as needed (max 8 tablets per day).   Xarelto 15 MG Tabs tablet Generic drug: Rivaroxaban TAKE 1 TABLET(15 MG) BY MOUTH DAILY WITH SUPPER               Durable Medical Equipment  (From admission, onward)           Start     Ordered   01/14/22 1549  For home use only DME oxygen  Once       Question Answer Comment  Length of Need 6 Months   Mode or (Route) Nasal cannula   Liters per Minute 3   Frequency Continuous (stationary and portable oxygen unit needed)   Oxygen conserving device Yes   Oxygen delivery system Gas      01/14/22 1548   01/13/22 0831  For home use only DME oxygen  Once       Question Answer Comment  Length of Need Lifetime   Mode or (Route) Nasal cannula   Liters per Minute 2   Frequency Continuous (stationary and portable oxygen  unit needed)   Oxygen conserving device Yes   Oxygen delivery system Gas      01/13/22 0831            Follow-up Information     Schedule an appointment as soon as possible for a visit  with [name], MD.   Specialty: Family Medicine Why: As needed or ER follow-up Contact information: [address] [place] [address] [phone]                 Consultations: none  Procedures/Studies:  ECHOCARDIOGRAM COMPLETE  Result Date: 01/10/2022    ECHOCARDIOGRAM REPORT   Patient Name:   Anna Farrell Date of Exam: 01/10/2022 Medical Rec #:  [id number]     Height:       62.0 in Accession #:    [id number]    Weight:       134.9 lb Date of Birth:  1927/08/13     BSA:          1.617 m Patient Age:    62 years      BP:           100/62 mmHg Patient Gender: F             HR:           99 bpm. Exam Location:  Inpatient Procedure: 2D Echo, Color Doppler and Cardiac Doppler Indications:    CHF-Acute Diastolic Z85.88  History:        Patient  has prior history of Echocardiogram examinations, most                 recent 07/28/2020. Pacemaker, Arrythmias:Tachycardia and Atrial                 Fibrillation; Risk Factors:Non-Smoker, Dyslipidemia and                 Hypertension.  Sonographer:    Greer Pickerel Referring Phys: [id number] OINOMVEHM RATHORE  Sonographer Comments: Image acquisition challenging due to patient body habitus and Image acquisition challenging due to respiratory motion. Technically difficult due to lung interference, active URI IMPRESSIONS  1. Left ventricular ejection fraction, by estimation, is 65 to 70%. The left ventricle has normal function. The left ventricle has no regional wall motion abnormalities. There is mild left ventricular hypertrophy. Left ventricular diastolic parameters are indeterminate.  2. Right ventricular systolic function is normal. The right ventricular size is normal. There is normal pulmonary artery systolic pressure.  3. Left atrial size was moderately dilated.  4. A small pericardial effusion is present.  5. The mitral valve is degenerative. Trivial mitral valve regurgitation. No evidence of mitral stenosis. Moderate mitral annular calcification.  6. The aortic valve is calcified. There is moderate calcification of the aortic valve. Aortic valve regurgitation is not visualized. Mild aortic valve stenosis. Vmax 2.3 m/s, MG 10 mmHg, AVA 1.4 cm^2, DI 0.53  7. Aortic dilatation noted. There is dilatation of the aortic root, measuring 42 mm.  8. The inferior vena cava is dilated in size with >50% respiratory variability, suggesting right atrial pressure of 8 mmHg. FINDINGS  Left Ventricle: Left ventricular ejection fraction, by estimation, is 65 to 70%. The left ventricle has normal function. The left ventricle has no regional wall motion abnormalities. The left ventricular internal cavity size was normal in size. There is  mild left ventricular hypertrophy. Left ventricular diastolic parameters are indeterminate.  Right Ventricle: The right ventricular size is normal. No increase in  right ventricular wall thickness. Right ventricular systolic function is normal. There is normal pulmonary artery systolic pressure. The tricuspid regurgitant velocity is 1.87 m/s, and  with an assumed right atrial pressure of 8 mmHg, the estimated right ventricular systolic pressure is 30.1 mmHg. Left Atrium: Left atrial size was moderately dilated. Right Atrium: Right atrial size was normal in size. Pericardium: A small pericardial effusion is present. Presence of epicardial fat layer. Mitral Valve: The mitral valve is degenerative in appearance. Moderate mitral annular calcification. Trivial mitral valve regurgitation. No evidence of mitral valve stenosis. MV peak gradient, 3.7 mmHg. The mean mitral valve gradient is 1.0 mmHg. Tricuspid Valve: The tricuspid valve is normal in structure. Tricuspid valve regurgitation is trivial. Aortic Valve: The aortic valve is calcified. There is moderate calcification of the aortic valve. Aortic valve regurgitation is not visualized. Mild aortic stenosis is present. Aortic valve mean gradient measures 10.0 mmHg. Aortic valve peak gradient measures 20.9 mmHg. Aortic valve area, by VTI measures 1.36 cm. Pulmonic Valve: The pulmonic valve was not well visualized. Pulmonic valve regurgitation is not visualized. Aorta: Aortic dilatation noted. There is dilatation of the aortic root, measuring 42 mm. Venous: The inferior vena cava is dilated in size with greater than 50% respiratory variability, suggesting right atrial pressure of 8 mmHg. IAS/Shunts: The interatrial septum was not well visualized.  LEFT VENTRICLE PLAX 2D LVIDd:         4.00 cm   Diastology LVIDs:         2.60 cm   LV e' medial:    11.50 cm/s LV PW:         1.20 cm   LV E/e' medial:  6.4 LV IVS:        1.00 cm   LV e' lateral:   9.90 cm/s LVOT diam:     1.80 cm   LV E/e' lateral: 7.5 LV SV:         54 LV SV Index:   34 LVOT Area:     2.54 cm   RIGHT VENTRICLE RV S prime:     15.60 cm/s TAPSE (M-mode): 1.6 cm LEFT ATRIUM             Index        RIGHT ATRIUM           Index LA diam:        4.10 cm 2.54 cm/m   RA Area:     16.80 cm LA Vol (A2C):   78.6 ml 48.60 ml/m  RA Volume:   36.40 ml  22.51 ml/m LA Vol (A4C):   62.5 ml 38.65 ml/m LA Biplane Vol: 71.0 ml 43.90 ml/m  AORTIC VALVE AV Area (Vmax):    1.35 cm AV Area (Vmean):   1.62 cm AV Area (VTI):     1.36 cm AV Vmax:           228.50 cm/s AV Vmean:          149.000 cm/s AV VTI:            0.398 m AV Peak Grad:      20.9 mmHg AV Mean Grad:      10.0 mmHg LVOT Vmax:         121.00 cm/s LVOT Vmean:        94.600 cm/s LVOT VTI:          0.213 m LVOT/AV VTI ratio: 0.53  AORTA Ao Root diam: 4.20 cm Ao Asc diam:  3.70 cm  MITRAL VALVE               TRICUSPID VALVE MV Area (PHT): 4.46 cm    TR Peak grad:   14.0 mmHg MV Area VTI:   3.11 cm    TR Vmax:        187.00 cm/s MV Peak grad:  3.7 mmHg MV Mean grad:  1.0 mmHg    SHUNTS MV Vmax:       0.96 m/s    Systemic VTI:  0.21 m MV Vmean:      48.6 cm/s   Systemic Diam: 1.80 cm MV Decel Time: 170 msec MR Peak grad: 6.6 mmHg MR Vmax:      128.00 cm/s MV E velocity: 74.10 cm/s MV A velocity: 30.40 cm/s MV E/A ratio:  2.44 Oswaldo Milian MD Electronically signed by Oswaldo Milian MD Signature Date/Time: 01/10/2022/12:48:09 PM    Final    CT Angio Chest PE W and/or Wo Contrast  Result Date: 01/08/2022 CLINICAL DATA:  Hypomobility clinical suspicion for pulmonary embolism. Shortness of breath. EXAM: CT ANGIOGRAPHY CHEST WITH CONTRAST TECHNIQUE: Multidetector CT imaging of the chest was performed using the standard protocol during bolus administration of intravenous contrast. Multiplanar CT image reconstructions and MIPs were obtained to evaluate the vascular anatomy. RADIATION DOSE REDUCTION: This exam was performed according to the departmental dose-optimization program which includes automated exposure control, adjustment of the mA and/or kV  according to patient size and/or use of iterative reconstruction technique. CONTRAST:  3m OMNIPAQUE IOHEXOL 350 MG/ML SOLN COMPARISON:  Chest x-ray 01/08/2022 and older. Old CT scan 2009 abdomen and pelvis CT 01/30/2014 FINDINGS: Cardiovascular: No segmental or larger pulmonary embolism identified. No abnormal enlargement of the main pulmonary artery. The thoracic aorta has normal caliber. There is a tortuous course of the descending thoracic aorta. Scattered vascular calcifications are seen. Coronary artery calcifications are also noted. Left upper chest battery pack with leads extending along the right side of the heart. Pacemaker. Mediastinum/Nodes: Nonspecific abnormal lymph node enlargement identified in the axillary region, hilum and mediastinum. Moderate small hiatal hernia. Lungs/Pleura: Mild breathing motion. There is some patchy opacity seen with some ground-glass components in both lower lobes. There are areas of bronchiectasis identified along the lingula with some opacity along the area of bronchiectasis. Additional smaller nodules are seen such as middle lobe on axial image 72 measuring 5 mm. Additional small foci under 5 mm in lingula inferiorly. No pneumothorax or effusion. Upper Abdomen: Previous cholecystectomy. Adrenal glands are preserved. Dilated common duct. This has been present since at least 2016 9 CT scan. Musculoskeletal: Scattered degenerative changes along the spine. There is moderate compression deformity L1 with augmentation cement. Slight kyphosis. Review of the MIP images confirms the above findings. IMPRESSION: No pulmonary embolism identified. Subtle lung base parenchymal opacities. Acute infiltrates possible. Recommend follow-up. Few tiny lung nodules are seen measuring up to 5 mm. No follow-up needed if patient is low-risk (and has no known or suspected primary neoplasm). Non-contrast chest CT can be considered in 12 months if patient is high-risk. This recommendation follows  the consensus statement: Guidelines for Management of Incidental Pulmonary Nodules Detected on CT Images: From the Fleischner Society 2017; Radiology 2017; 284:228-243. Moderate hiatal hernia. Previous cholecystectomy. There is a dilated common duct. This has been present since at least 2016. Electronically Signed   By: AJill SideM.D.   On: 01/08/2022 18:08   DG Chest 2 View  Result Date: 01/08/2022 CLINICAL DATA:  Shortness of breath EXAM: CHEST -  2 VIEW COMPARISON:  Radiographs 07/27/2020 FINDINGS: Unchanged dual chamber pacemaker leads. Unchanged cardiomediastinal silhouette. There is no focal airspace consolidation. There is no pleural effusion or evidence of pneumothorax. Prior L1 vertebroplasty. No acute osseous abnormality. Thoracic spondylosis. IMPRESSION: No evidence of acute cardiopulmonary disease. Electronically Signed   By: Maurine Simmering M.D.   On: 01/08/2022 12:44     Subjective: - no chest pain, shortness of breath, no abdominal pain, nausea or vomiting.   Discharge Exam: BP (!) 164/94 (BP Location: Right Arm)   Pulse 84   Temp 97.6 F (36.4 C) (Oral)   Resp 17   Ht '5\' 2"'$  (1.575 m)   Wt 61.2 kg   SpO2 95%   BMI 24.68 kg/m   General: Pt is alert, awake, not in acute distress Cardiovascular: RRR, S1/S2 +, no rubs, no gallops Respiratory: CTA bilaterally, no wheezing, no rhonchi Abdominal: Soft, NT, ND, bowel sounds + Extremities: no edema, no cyanosis    The results of significant diagnostics from this hospitalization (including imaging, microbiology, ancillary and laboratory) are listed below for reference.     Microbiology: Recent Results (from the past 240 hour(s))  Resp panel by RT-PCR (RSV, Flu A&B, Covid) Anterior Nasal Swab     Status: None   Collection Time: 01/08/22 12:01 PM   Specimen: Anterior Nasal Swab  Result Value Ref Range Status   SARS Coronavirus 2 by RT PCR NEGATIVE NEGATIVE Final    Comment: (NOTE) SARS-CoV-2 target nucleic acids are NOT  DETECTED.  The SARS-CoV-2 RNA is generally detectable in upper respiratory specimens during the acute phase of infection. The lowest concentration of SARS-CoV-2 viral copies this assay can detect is 138 copies/mL. A negative result does not preclude SARS-Cov-2 infection and should not be used as the sole basis for treatment or other patient management decisions. A negative result may occur with  improper specimen collection/handling, submission of specimen other than nasopharyngeal swab, presence of viral mutation(s) within the areas targeted by this assay, and inadequate number of viral copies(<138 copies/mL). A negative result must be combined with clinical observations, patient history, and epidemiological information. The expected result is Negative.  Fact Sheet for Patients:  EntrepreneurPulse.com.au  Fact Sheet for Healthcare Providers:  IncredibleEmployment.be  This test is no t yet approved or cleared by the [place] FDA and  has been authorized for detection and/or diagnosis of SARS-CoV-2 by FDA under an Emergency Use Authorization (EUA). This EUA will remain  in effect (meaning this test can be used) for the duration of the COVID-19 declaration under Section 564(b)(1) of the Act, 21 U.S.C.section 360bbb-3(b)(1), unless the authorization is terminated  or revoked sooner.       Influenza A by PCR NEGATIVE NEGATIVE Final   Influenza B by PCR NEGATIVE NEGATIVE Final    Comment: (NOTE) The Xpert Xpress SARS-CoV-2/FLU/RSV plus assay is intended as an aid in the diagnosis of influenza from Nasopharyngeal swab specimens and should not be used as a sole basis for treatment. Nasal washings and aspirates are unacceptable for Xpert Xpress SARS-CoV-2/FLU/RSV testing.  Fact Sheet for Patients: EntrepreneurPulse.com.au  Fact Sheet for Healthcare Providers: IncredibleEmployment.be  This test is not yet  approved or cleared by the [place] FDA and has been authorized for detection and/or diagnosis of SARS-CoV-2 by FDA under an Emergency Use Authorization (EUA). This EUA will remain in effect (meaning this test can be used) for the duration of the COVID-19 declaration under Section 564(b)(1) of the Act, 21 U.S.C. section 360bbb-3(b)(1), unless  the authorization is terminated or revoked.     Resp Syncytial Virus by PCR NEGATIVE NEGATIVE Final    Comment: (NOTE) Fact Sheet for Patients: EntrepreneurPulse.com.au  Fact Sheet for Healthcare Providers: IncredibleEmployment.be  This test is not yet approved or cleared by the [place] FDA and has been authorized for detection and/or diagnosis of SARS-CoV-2 by FDA under an Emergency Use Authorization (EUA). This EUA will remain in effect (meaning this test can be used) for the duration of the COVID-19 declaration under Section 564(b)(1) of the Act, 21 U.S.C. section 360bbb-3(b)(1), unless the authorization is terminated or revoked.  Performed at [facility], [address], [place], [place] [address]   Urine Culture     Status: Abnormal   Collection Time: 01/09/22  8:49 PM   Specimen: Urine, Clean Catch  Result Value Ref Range Status   Specimen Description URINE, CLEAN CATCH  Final   Special Requests   Final    NONE Performed at [facility], [place] [address], [place], [place] [address]    Culture MULTIPLE SPECIES PRESENT, SUGGEST RECOLLECTION (A)  Final   Report Status 01/12/2022 FINAL  Final  Respiratory (~20 pathogens) panel by PCR     Status: None   Collection Time: 01/10/22  6:20 AM   Specimen: Nasopharyngeal Swab; Respiratory  Result Value Ref Range Status   Adenovirus NOT DETECTED NOT DETECTED Final   Coronavirus 229E NOT DETECTED NOT DETECTED Final    Comment: (NOTE) The Coronavirus on the Respiratory Panel, DOES NOT test for the novel  Coronavirus  (2019 nCoV)    Coronavirus HKU1 NOT DETECTED NOT DETECTED Final   Coronavirus NL63 NOT DETECTED NOT DETECTED Final   Coronavirus OC43 NOT DETECTED NOT DETECTED Final   Metapneumovirus NOT DETECTED NOT DETECTED Final   Rhinovirus / Enterovirus NOT DETECTED NOT DETECTED Final   Influenza A NOT DETECTED NOT DETECTED Final   Influenza B NOT DETECTED NOT DETECTED Final   Parainfluenza Virus 1 NOT DETECTED NOT DETECTED Final   Parainfluenza Virus 2 NOT DETECTED NOT DETECTED Final   Parainfluenza Virus 3 NOT DETECTED NOT DETECTED Final   Parainfluenza Virus 4 NOT DETECTED NOT DETECTED Final   Respiratory Syncytial Virus NOT DETECTED NOT DETECTED Final   Bordetella pertussis NOT DETECTED NOT DETECTED Final   Bordetella Parapertussis NOT DETECTED NOT DETECTED Final   Chlamydophila pneumoniae NOT DETECTED NOT DETECTED Final   Mycoplasma pneumoniae NOT DETECTED NOT DETECTED Final    Comment: Performed at [facility], [place] [address], [place], [place] [address]     Labs: Basic Metabolic Panel: Recent Labs  Lab 01/10/22 0236 01/11/22 0420 01/12/22 0259 01/13/22 0216 01/14/22 0250  NA 134* 137 137 135 138  K 4.3 3.8 3.4* 3.6 3.3*  CL 100 101 100 95* 98  CO2 '27 29 31 29 30  '$ GLUCOSE 136* 138* 129* 127* 96  BUN '22 16 14 18 16  '$ CREATININE 0.88 0.85 0.84 0.94 0.80  CALCIUM 8.9 9.4 8.8* 9.1 8.9  MG 1.9 1.9  --  1.7  --    Liver Function Tests: Recent Labs  Lab 01/08/22 1201 01/11/22 0420  AST 15 21  ALT 7 13  ALKPHOS 74 56  BILITOT 0.9 0.6  PROT 7.0 5.6*  ALBUMIN 4.0 2.7*   CBC: Recent Labs  Lab 01/08/22 1201 01/09/22 0734 01/10/22 0236 01/11/22 0420  WBC 17.7* 13.1* 7.6 9.8  HGB 11.7* 9.9* 9.8* 10.0*  HCT 39.2 32.6* 33.7* 33.6*  MCV 80.7  79.5* 82.6 79.8*  PLT 355 239 217 290   CBG: No results for input(s): "GLUCAP" in the last 168 hours. Hgb A1c No results for input(s): "HGBA1C" in the last 72 hours. Lipid Profile No results for input(s): "CHOL", "HDL",  "LDLCALC", "TRIG", "CHOLHDL", "LDLDIRECT" in the last 72 hours. Thyroid function studies No results for input(s): "TSH", "T4TOTAL", "T3FREE", "THYROIDAB" in the last 72 hours.  Invalid input(s): "FREET3" Urinalysis    Component Value Date/Time   COLORURINE YELLOW 01/08/2022 1829   APPEARANCEUR HAZY (A) 01/08/2022 1829   LABSPEC 1.043 (H) 01/08/2022 1829   PHURINE 8.5 (H) 01/08/2022 1829   GLUCOSEU NEGATIVE 01/08/2022 1829   HGBUR NEGATIVE 01/08/2022 1829   BILIRUBINUR NEGATIVE 01/08/2022 1829   KETONESUR NEGATIVE 01/08/2022 1829   PROTEINUR 100 (A) 01/08/2022 1829   UROBILINOGEN 1.0 01/30/2014 1421   NITRITE NEGATIVE 01/08/2022 1829   LEUKOCYTESUR MODERATE (A) 01/08/2022 1829    FURTHER DISCHARGE INSTRUCTIONS:   Get Medicines reviewed and adjusted: Please take all your medications with you for your next visit with your Primary MD   Laboratory/radiological data: Please request your Primary MD to go over all hospital tests and procedure/radiological results at the follow up, please ask your Primary MD to get all Hospital records sent to his/her office.   In some cases, they will be blood work, cultures and biopsy results pending at the time of your discharge. Please request that your primary care M.D. goes through all the records of your hospital data and follows up on these results.   Also Note the following: If you experience worsening of your admission symptoms, develop shortness of breath, life threatening emergency, suicidal or homicidal thoughts you must seek medical attention immediately by calling 911 or calling your MD immediately  if symptoms less severe.   You must read complete instructions/literature along with all the possible adverse reactions/side effects for all the Medicines you take and that have been prescribed to you. Take any new Medicines after you have completely understood and accpet all the possible adverse reactions/side effects.    Do not drive when  taking Pain medications or sleeping medications (Benzodaizepines)   Do not take more than prescribed Pain, Sleep and Anxiety Medications. It is not advisable to combine anxiety,sleep and pain medications without talking with your primary care practitioner   Special Instructions: If you have smoked or chewed Tobacco  in the last 2 yrs please stop smoking, stop any regular Alcohol  and or any Recreational drug use.   Wear Seat belts while driving.   Please note: You were cared for by a hospitalist during your hospital stay. Once you are discharged, your primary care physician will handle any further medical issues. Please note that NO REFILLS for any discharge medications will be authorized once you are discharged, as it is imperative that you return to your primary care physician (or establish a relationship with a primary care physician if you do not have one) for your post hospital discharge needs so that they can reassess your need for medications and monitor your lab values.  Time coordinating discharge: 35 minutes  SIGNED:  Marzetta Board, MD, PhD 01/14/2022, 3:49 PM

## 2022-01-14 NOTE — TOC Progression Note (Addendum)
Transition of Care (TOC) - Progression Note  Per last oxygen saturation ambulation note patient does not qualify for home oxygen   1600 secure chatted , PT repeated ambulation oxygen saturation , patient does now qualify for home oxygen. Asked nurse /PT to enter note. Once note entered NCM will order home oxygen. NCM discussed with patient and daughter at bedside and her son via phone.   Caryl Pina with [facility] aware discharge today HHPT/RN   Home oxygen ordered with [facility] with [employer]   Patient Details  Name: Anna Farrell MRN: [id number] Date of Birth: 11-17-1927  Transition of Care [facility]) CM/SW Contact  Dhanvi Boesen, Edson Snowball, RN Phone Number: 01/14/2022, 9:25 AM  Clinical Narrative:       Expected Discharge Plan: [place] Barriers to Discharge: Continued Medical Work up  Expected Discharge Plan and Services   Discharge Planning Services: CM Consult Post Acute Care Choice: [place] arrangements for the past 2 months: Single Family Home                 DME Arranged: N/A DME Agency: NA       HH Arranged: RN, PT [place] Agency: [place] ([place]) Date [place]: 01/11/22 Time [place]: [id number] Representative spoke with at [place]: [place] ([place]) Interventions SDOH Screenings   Food Insecurity: No Food Insecurity (01/09/2022)  Housing: Low Risk  (01/09/2022)  Transportation Needs: No Transportation Needs (01/09/2022)  Utilities: Not At Risk (01/09/2022)  Tobacco Use: Low Risk  (01/08/2022)    Readmission Risk Interventions     No data to display

## 2022-01-14 NOTE — Progress Notes (Signed)
SATURATION QUALIFICATIONS: (This note is used to comply with regulatory documentation for home oxygen)  Patient Saturations on Room Air at Rest = 91%  Patient Saturations on Room Air while Ambulating = 73%  Patient Saturations on 3 Liters of oxygen while Ambulating = 94%  Please briefly explain why patient needs home oxygen:

## 2022-01-14 NOTE — Progress Notes (Signed)
Physical Therapy Treatment Patient Details Name: Anna Farrell MRN: [id number] DOB: 03/20/1927 Today's Date: 01/14/2022   History of Present Illness Pt is a 89 y/o F admitted on 01/08/22 after pt presented with c/o low grade fevers & URI symptoms x 2 weeks, as well as dysuria. Pt found to be hypoxic requiring supplemental O2. CT angiogram showed bibasilar parenchymal opacities suspicious for possible acute infiltrates. UA was also concerning for an infection. PMH: asthma, anxiety, chronic a-fib on Xarelto, tachybradycardia syndrome s/p pacemaker, OSA, hypothyroidism, HTN, HLD    PT Comments    Pt progressing towards physical therapy goals. Focus of session was monitoring O2 sats during mobility. See gait details for more info. MD and RN notified after session of O2 status. Pt has all DME needed at home and will have good social support. Pt anticipates d/c home today. Will continue to follow.     Recommendations for follow up therapy are one component of a multi-disciplinary discharge planning process, led by the attending physician.  Recommendations may be updated based on patient status, additional functional criteria and insurance authorization.  Follow Up Recommendations  Home health PT     Assistance Recommended at Discharge Intermittent Supervision/Assistance  Patient can return home with the following A little help with walking and/or transfers;A little help with bathing/dressing/bathroom;Assistance with cooking/housework;Assist for transportation   Equipment Recommendations  None recommended by PT    Recommendations for Other Services       Precautions / Restrictions Precautions Precautions: Fall Precaution Comments: monitor HR & O2 Restrictions Weight Bearing Restrictions: No     Mobility  Bed Mobility               General bed mobility comments: not tested, pt received sitting up in the recliner.    Transfers Overall transfer level: Needs assistance Equipment  used: Rolling walker (2 wheels) Transfers: Sit to/from Stand Sit to Stand: Min guard, Supervision           General transfer comment: Close guard to supervision for power up to full stand. VC's throughout for optimal safety, keeping walker close when preparing to sit, and controlled lower to chair and toilet.    Ambulation/Gait Ambulation/Gait assistance: Min guard Gait Distance (Feet): 75 Feet (+25') Assistive device: None, Rolling walker (2 wheels) Gait Pattern/deviations: Decreased step length - right, Decreased step length - left, Decreased stride length, Decreased dorsiflexion - right, Decreased dorsiflexion - left Gait velocity: decreased Gait velocity interpretation: <1.31 ft/sec, indicative of household ambulator   General Gait Details: Initially ambulated in hallway on RA with RW for support. O2 sats decreased to 73%, After recovery supplemental O2 donned and pt ambulated to/from bathroom. Initially on 2L and sats down to 71%, and then on 3L sats maintained >88% throughout.   Stairs             Wheelchair Mobility    Modified Rankin (Stroke Patients Only)       Balance Overall balance assessment: Needs assistance   Sitting balance-Leahy Scale: Good     Standing balance support: No upper extremity supported, During functional activity Standing balance-Leahy Scale: Fair                              Cognition Arousal/Alertness: Awake/alert Behavior During Therapy: WFL for tasks assessed/performed Overall Cognitive Status: Within Functional Limits for tasks assessed  Exercises      General Comments        Pertinent Vitals/Pain      Home Living                          Prior Function            PT Goals (current goals can now be found in the care plan section) Acute Rehab PT Goals Patient Stated Goal: to stay as long as needed to get better, not d/c home then  have to come back PT Goal Formulation: With patient/family Time For Goal Achievement: 01/25/22 Potential to Achieve Goals: Good Progress towards PT goals: Progressing toward goals    Frequency    Min 3X/week      PT Plan Current plan remains appropriate    Co-evaluation              AM-PAC PT "6 Clicks" Mobility   Outcome Measure  Help needed turning from your back to your side while in a flat bed without using bedrails?: None Help needed moving from lying on your back to sitting on the side of a flat bed without using bedrails?: A Little Help needed moving to and from a bed to a chair (including a wheelchair)?: A Little Help needed standing up from a chair using your arms (e.g., wheelchair or bedside chair)?: A Little Help needed to walk in hospital room?: A Little Help needed climbing 3-5 steps with a railing? : A Little 6 Click Score: 19    End of Session Equipment Utilized During Treatment: Oxygen Activity Tolerance: Patient tolerated treatment well;Treatment limited secondary to medical complications (Comment) Patient left: in chair;with call bell/phone within reach Nurse Communication: Mobility status (HR, O2 during session) PT Visit Diagnosis: Muscle weakness (generalized) (M62.81);Unsteadiness on feet (R26.81)     Time: 0962-8366 PT Time Calculation (min) (ACUTE ONLY): 32 min  Charges:  $Gait Training: 23-37 mins                     Rolinda Roan, PT, DPT Acute Rehabilitation Services Secure Chat Preferred Office: [phone]    Thelma Comp 01/14/2022, 4:27 PM

## 2022-01-14 NOTE — Care Management Important Message (Signed)
Important Message  Patient Details  Name: Anna Farrell MRN: [id number] Date of Birth: Apr 06, 1927   Medicare Important Message Given:  Yes     [name] 01/14/2022, 8:13 AM

## 2022-01-16 DIAGNOSIS — F419 Anxiety disorder, unspecified: Secondary | ICD-10-CM | POA: Diagnosis not present

## 2022-01-16 DIAGNOSIS — Z7901 Long term (current) use of anticoagulants: Secondary | ICD-10-CM | POA: Diagnosis not present

## 2022-01-16 DIAGNOSIS — N390 Urinary tract infection, site not specified: Secondary | ICD-10-CM | POA: Diagnosis not present

## 2022-01-16 DIAGNOSIS — K219 Gastro-esophageal reflux disease without esophagitis: Secondary | ICD-10-CM | POA: Diagnosis not present

## 2022-01-16 DIAGNOSIS — I951 Orthostatic hypotension: Secondary | ICD-10-CM | POA: Diagnosis not present

## 2022-01-16 DIAGNOSIS — E785 Hyperlipidemia, unspecified: Secondary | ICD-10-CM | POA: Diagnosis not present

## 2022-01-16 DIAGNOSIS — N179 Acute kidney failure, unspecified: Secondary | ICD-10-CM | POA: Diagnosis not present

## 2022-01-16 DIAGNOSIS — E039 Hypothyroidism, unspecified: Secondary | ICD-10-CM | POA: Diagnosis not present

## 2022-01-16 DIAGNOSIS — I4821 Permanent atrial fibrillation: Secondary | ICD-10-CM | POA: Diagnosis not present

## 2022-01-16 DIAGNOSIS — G4733 Obstructive sleep apnea (adult) (pediatric): Secondary | ICD-10-CM | POA: Diagnosis not present

## 2022-01-16 DIAGNOSIS — M545 Low back pain, unspecified: Secondary | ICD-10-CM | POA: Diagnosis not present

## 2022-01-16 DIAGNOSIS — Z9181 History of falling: Secondary | ICD-10-CM | POA: Diagnosis not present

## 2022-01-16 DIAGNOSIS — J9601 Acute respiratory failure with hypoxia: Secondary | ICD-10-CM | POA: Diagnosis not present

## 2022-01-16 DIAGNOSIS — D509 Iron deficiency anemia, unspecified: Secondary | ICD-10-CM | POA: Diagnosis not present

## 2022-01-16 DIAGNOSIS — J188 Other pneumonia, unspecified organism: Secondary | ICD-10-CM | POA: Diagnosis not present

## 2022-01-16 DIAGNOSIS — I495 Sick sinus syndrome: Secondary | ICD-10-CM | POA: Diagnosis not present

## 2022-01-16 DIAGNOSIS — I5032 Chronic diastolic (congestive) heart failure: Secondary | ICD-10-CM | POA: Diagnosis not present

## 2022-01-16 DIAGNOSIS — G2581 Restless legs syndrome: Secondary | ICD-10-CM | POA: Diagnosis not present

## 2022-01-16 DIAGNOSIS — E876 Hypokalemia: Secondary | ICD-10-CM | POA: Diagnosis not present

## 2022-01-16 DIAGNOSIS — M810 Age-related osteoporosis without current pathological fracture: Secondary | ICD-10-CM | POA: Diagnosis not present

## 2022-01-16 DIAGNOSIS — J45901 Unspecified asthma with (acute) exacerbation: Secondary | ICD-10-CM | POA: Diagnosis not present

## 2022-01-16 DIAGNOSIS — G8929 Other chronic pain: Secondary | ICD-10-CM | POA: Diagnosis not present

## 2022-01-16 DIAGNOSIS — M159 Polyosteoarthritis, unspecified: Secondary | ICD-10-CM | POA: Diagnosis not present

## 2022-01-16 DIAGNOSIS — I110 Hypertensive heart disease with heart failure: Secondary | ICD-10-CM | POA: Diagnosis not present

## 2022-01-16 DIAGNOSIS — Z9981 Dependence on supplemental oxygen: Secondary | ICD-10-CM | POA: Diagnosis not present

## 2022-01-17 DIAGNOSIS — J9601 Acute respiratory failure with hypoxia: Secondary | ICD-10-CM | POA: Diagnosis not present

## 2022-01-17 DIAGNOSIS — N179 Acute kidney failure, unspecified: Secondary | ICD-10-CM | POA: Diagnosis not present

## 2022-01-17 DIAGNOSIS — I5032 Chronic diastolic (congestive) heart failure: Secondary | ICD-10-CM | POA: Diagnosis not present

## 2022-01-17 DIAGNOSIS — I110 Hypertensive heart disease with heart failure: Secondary | ICD-10-CM | POA: Diagnosis not present

## 2022-01-17 DIAGNOSIS — J188 Other pneumonia, unspecified organism: Secondary | ICD-10-CM | POA: Diagnosis not present

## 2022-01-17 DIAGNOSIS — I4821 Permanent atrial fibrillation: Secondary | ICD-10-CM | POA: Diagnosis not present

## 2022-01-18 DIAGNOSIS — D72829 Elevated white blood cell count, unspecified: Secondary | ICD-10-CM | POA: Diagnosis not present

## 2022-01-18 DIAGNOSIS — J9601 Acute respiratory failure with hypoxia: Secondary | ICD-10-CM | POA: Diagnosis not present

## 2022-01-18 DIAGNOSIS — D649 Anemia, unspecified: Secondary | ICD-10-CM | POA: Diagnosis not present

## 2022-01-18 DIAGNOSIS — J189 Pneumonia, unspecified organism: Secondary | ICD-10-CM | POA: Diagnosis not present

## 2022-01-18 DIAGNOSIS — J45901 Unspecified asthma with (acute) exacerbation: Secondary | ICD-10-CM | POA: Diagnosis not present

## 2022-01-22 DIAGNOSIS — I5032 Chronic diastolic (congestive) heart failure: Secondary | ICD-10-CM | POA: Diagnosis not present

## 2022-01-22 DIAGNOSIS — I110 Hypertensive heart disease with heart failure: Secondary | ICD-10-CM | POA: Diagnosis not present

## 2022-01-22 DIAGNOSIS — N179 Acute kidney failure, unspecified: Secondary | ICD-10-CM | POA: Diagnosis not present

## 2022-01-22 DIAGNOSIS — I4821 Permanent atrial fibrillation: Secondary | ICD-10-CM | POA: Diagnosis not present

## 2022-01-22 DIAGNOSIS — J188 Other pneumonia, unspecified organism: Secondary | ICD-10-CM | POA: Diagnosis not present

## 2022-01-22 DIAGNOSIS — J9601 Acute respiratory failure with hypoxia: Secondary | ICD-10-CM | POA: Diagnosis not present

## 2022-01-24 DIAGNOSIS — I5032 Chronic diastolic (congestive) heart failure: Secondary | ICD-10-CM | POA: Diagnosis not present

## 2022-01-24 DIAGNOSIS — I110 Hypertensive heart disease with heart failure: Secondary | ICD-10-CM | POA: Diagnosis not present

## 2022-01-24 DIAGNOSIS — I4821 Permanent atrial fibrillation: Secondary | ICD-10-CM | POA: Diagnosis not present

## 2022-01-24 DIAGNOSIS — J188 Other pneumonia, unspecified organism: Secondary | ICD-10-CM | POA: Diagnosis not present

## 2022-01-24 DIAGNOSIS — J9601 Acute respiratory failure with hypoxia: Secondary | ICD-10-CM | POA: Diagnosis not present

## 2022-01-24 DIAGNOSIS — N179 Acute kidney failure, unspecified: Secondary | ICD-10-CM | POA: Diagnosis not present

## 2022-01-25 DIAGNOSIS — I5032 Chronic diastolic (congestive) heart failure: Secondary | ICD-10-CM | POA: Diagnosis not present

## 2022-01-25 DIAGNOSIS — J188 Other pneumonia, unspecified organism: Secondary | ICD-10-CM | POA: Diagnosis not present

## 2022-01-25 DIAGNOSIS — N179 Acute kidney failure, unspecified: Secondary | ICD-10-CM | POA: Diagnosis not present

## 2022-01-25 DIAGNOSIS — I4821 Permanent atrial fibrillation: Secondary | ICD-10-CM | POA: Diagnosis not present

## 2022-01-25 DIAGNOSIS — I110 Hypertensive heart disease with heart failure: Secondary | ICD-10-CM | POA: Diagnosis not present

## 2022-01-25 DIAGNOSIS — J9601 Acute respiratory failure with hypoxia: Secondary | ICD-10-CM | POA: Diagnosis not present

## 2022-01-28 DIAGNOSIS — H10502 Unspecified blepharoconjunctivitis, left eye: Secondary | ICD-10-CM | POA: Diagnosis not present

## 2022-01-28 DIAGNOSIS — I4821 Permanent atrial fibrillation: Secondary | ICD-10-CM | POA: Diagnosis not present

## 2022-01-28 DIAGNOSIS — J188 Other pneumonia, unspecified organism: Secondary | ICD-10-CM | POA: Diagnosis not present

## 2022-01-28 DIAGNOSIS — N179 Acute kidney failure, unspecified: Secondary | ICD-10-CM | POA: Diagnosis not present

## 2022-01-28 DIAGNOSIS — J9601 Acute respiratory failure with hypoxia: Secondary | ICD-10-CM | POA: Diagnosis not present

## 2022-01-28 DIAGNOSIS — I110 Hypertensive heart disease with heart failure: Secondary | ICD-10-CM | POA: Diagnosis not present

## 2022-01-28 DIAGNOSIS — H5712 Ocular pain, left eye: Secondary | ICD-10-CM | POA: Diagnosis not present

## 2022-01-28 DIAGNOSIS — I5032 Chronic diastolic (congestive) heart failure: Secondary | ICD-10-CM | POA: Diagnosis not present

## 2022-01-30 DIAGNOSIS — I4821 Permanent atrial fibrillation: Secondary | ICD-10-CM | POA: Diagnosis not present

## 2022-01-30 DIAGNOSIS — J188 Other pneumonia, unspecified organism: Secondary | ICD-10-CM | POA: Diagnosis not present

## 2022-01-30 DIAGNOSIS — N179 Acute kidney failure, unspecified: Secondary | ICD-10-CM | POA: Diagnosis not present

## 2022-01-30 DIAGNOSIS — I110 Hypertensive heart disease with heart failure: Secondary | ICD-10-CM | POA: Diagnosis not present

## 2022-01-30 DIAGNOSIS — H10412 Chronic giant papillary conjunctivitis, left eye: Secondary | ICD-10-CM | POA: Diagnosis not present

## 2022-01-30 DIAGNOSIS — H524 Presbyopia: Secondary | ICD-10-CM | POA: Diagnosis not present

## 2022-01-30 DIAGNOSIS — J9601 Acute respiratory failure with hypoxia: Secondary | ICD-10-CM | POA: Diagnosis not present

## 2022-01-30 DIAGNOSIS — I5032 Chronic diastolic (congestive) heart failure: Secondary | ICD-10-CM | POA: Diagnosis not present

## 2022-01-30 DIAGNOSIS — Z961 Presence of intraocular lens: Secondary | ICD-10-CM | POA: Diagnosis not present

## 2022-02-04 DIAGNOSIS — I4821 Permanent atrial fibrillation: Secondary | ICD-10-CM | POA: Diagnosis not present

## 2022-02-04 DIAGNOSIS — N179 Acute kidney failure, unspecified: Secondary | ICD-10-CM | POA: Diagnosis not present

## 2022-02-04 DIAGNOSIS — J9601 Acute respiratory failure with hypoxia: Secondary | ICD-10-CM | POA: Diagnosis not present

## 2022-02-04 DIAGNOSIS — I110 Hypertensive heart disease with heart failure: Secondary | ICD-10-CM | POA: Diagnosis not present

## 2022-02-04 DIAGNOSIS — I5032 Chronic diastolic (congestive) heart failure: Secondary | ICD-10-CM | POA: Diagnosis not present

## 2022-02-04 DIAGNOSIS — J188 Other pneumonia, unspecified organism: Secondary | ICD-10-CM | POA: Diagnosis not present

## 2022-02-06 DIAGNOSIS — I4821 Permanent atrial fibrillation: Secondary | ICD-10-CM | POA: Diagnosis not present

## 2022-02-06 DIAGNOSIS — I110 Hypertensive heart disease with heart failure: Secondary | ICD-10-CM | POA: Diagnosis not present

## 2022-02-06 DIAGNOSIS — J188 Other pneumonia, unspecified organism: Secondary | ICD-10-CM | POA: Diagnosis not present

## 2022-02-06 DIAGNOSIS — I5032 Chronic diastolic (congestive) heart failure: Secondary | ICD-10-CM | POA: Diagnosis not present

## 2022-02-06 DIAGNOSIS — N179 Acute kidney failure, unspecified: Secondary | ICD-10-CM | POA: Diagnosis not present

## 2022-02-06 DIAGNOSIS — J9601 Acute respiratory failure with hypoxia: Secondary | ICD-10-CM | POA: Diagnosis not present

## 2022-02-08 DIAGNOSIS — Z961 Presence of intraocular lens: Secondary | ICD-10-CM | POA: Diagnosis not present

## 2022-02-08 DIAGNOSIS — D3132 Benign neoplasm of left choroid: Secondary | ICD-10-CM | POA: Diagnosis not present

## 2022-02-08 DIAGNOSIS — H353134 Nonexudative age-related macular degeneration, bilateral, advanced atrophic with subfoveal involvement: Secondary | ICD-10-CM | POA: Diagnosis not present

## 2022-02-08 DIAGNOSIS — H401413 Capsular glaucoma with pseudoexfoliation of lens, right eye, severe stage: Secondary | ICD-10-CM | POA: Diagnosis not present

## 2022-02-12 DIAGNOSIS — J9601 Acute respiratory failure with hypoxia: Secondary | ICD-10-CM | POA: Diagnosis not present

## 2022-02-12 DIAGNOSIS — I110 Hypertensive heart disease with heart failure: Secondary | ICD-10-CM | POA: Diagnosis not present

## 2022-02-12 DIAGNOSIS — I5032 Chronic diastolic (congestive) heart failure: Secondary | ICD-10-CM | POA: Diagnosis not present

## 2022-02-12 DIAGNOSIS — J188 Other pneumonia, unspecified organism: Secondary | ICD-10-CM | POA: Diagnosis not present

## 2022-02-12 DIAGNOSIS — I4821 Permanent atrial fibrillation: Secondary | ICD-10-CM | POA: Diagnosis not present

## 2022-02-12 DIAGNOSIS — N179 Acute kidney failure, unspecified: Secondary | ICD-10-CM | POA: Diagnosis not present

## 2022-02-13 DIAGNOSIS — R7309 Other abnormal glucose: Secondary | ICD-10-CM | POA: Diagnosis not present

## 2022-02-13 DIAGNOSIS — Z0000 Encounter for general adult medical examination without abnormal findings: Secondary | ICD-10-CM | POA: Diagnosis not present

## 2022-02-13 DIAGNOSIS — E039 Hypothyroidism, unspecified: Secondary | ICD-10-CM | POA: Diagnosis not present

## 2022-02-13 DIAGNOSIS — H547 Unspecified visual loss: Secondary | ICD-10-CM | POA: Diagnosis not present

## 2022-02-13 DIAGNOSIS — D6869 Other thrombophilia: Secondary | ICD-10-CM | POA: Diagnosis not present

## 2022-02-13 DIAGNOSIS — I482 Chronic atrial fibrillation, unspecified: Secondary | ICD-10-CM | POA: Diagnosis not present

## 2022-02-13 DIAGNOSIS — G4733 Obstructive sleep apnea (adult) (pediatric): Secondary | ICD-10-CM | POA: Diagnosis not present

## 2022-02-13 DIAGNOSIS — J4530 Mild persistent asthma, uncomplicated: Secondary | ICD-10-CM | POA: Diagnosis not present

## 2022-02-13 DIAGNOSIS — E7800 Pure hypercholesterolemia, unspecified: Secondary | ICD-10-CM | POA: Diagnosis not present

## 2022-02-13 DIAGNOSIS — M549 Dorsalgia, unspecified: Secondary | ICD-10-CM | POA: Diagnosis not present

## 2022-02-13 DIAGNOSIS — D649 Anemia, unspecified: Secondary | ICD-10-CM | POA: Diagnosis not present

## 2022-02-13 DIAGNOSIS — F411 Generalized anxiety disorder: Secondary | ICD-10-CM | POA: Diagnosis not present

## 2022-02-13 DIAGNOSIS — I119 Hypertensive heart disease without heart failure: Secondary | ICD-10-CM | POA: Diagnosis not present

## 2022-02-14 DIAGNOSIS — E039 Hypothyroidism, unspecified: Secondary | ICD-10-CM | POA: Diagnosis not present

## 2022-02-14 DIAGNOSIS — M150 Primary generalized (osteo)arthritis: Secondary | ICD-10-CM | POA: Diagnosis not present

## 2022-02-14 DIAGNOSIS — E7800 Pure hypercholesterolemia, unspecified: Secondary | ICD-10-CM | POA: Diagnosis not present

## 2022-02-14 DIAGNOSIS — J4530 Mild persistent asthma, uncomplicated: Secondary | ICD-10-CM | POA: Diagnosis not present

## 2022-02-14 DIAGNOSIS — M810 Age-related osteoporosis without current pathological fracture: Secondary | ICD-10-CM | POA: Diagnosis not present

## 2022-02-14 DIAGNOSIS — I119 Hypertensive heart disease without heart failure: Secondary | ICD-10-CM | POA: Diagnosis not present

## 2022-02-14 DIAGNOSIS — K219 Gastro-esophageal reflux disease without esophagitis: Secondary | ICD-10-CM | POA: Diagnosis not present

## 2022-02-15 DIAGNOSIS — J9601 Acute respiratory failure with hypoxia: Secondary | ICD-10-CM | POA: Diagnosis not present

## 2022-02-15 DIAGNOSIS — I951 Orthostatic hypotension: Secondary | ICD-10-CM | POA: Diagnosis not present

## 2022-02-15 DIAGNOSIS — E876 Hypokalemia: Secondary | ICD-10-CM | POA: Diagnosis not present

## 2022-02-15 DIAGNOSIS — G4733 Obstructive sleep apnea (adult) (pediatric): Secondary | ICD-10-CM | POA: Diagnosis not present

## 2022-02-15 DIAGNOSIS — N179 Acute kidney failure, unspecified: Secondary | ICD-10-CM | POA: Diagnosis not present

## 2022-02-15 DIAGNOSIS — J188 Other pneumonia, unspecified organism: Secondary | ICD-10-CM | POA: Diagnosis not present

## 2022-02-15 DIAGNOSIS — J45901 Unspecified asthma with (acute) exacerbation: Secondary | ICD-10-CM | POA: Diagnosis not present

## 2022-02-15 DIAGNOSIS — D509 Iron deficiency anemia, unspecified: Secondary | ICD-10-CM | POA: Diagnosis not present

## 2022-02-15 DIAGNOSIS — G8929 Other chronic pain: Secondary | ICD-10-CM | POA: Diagnosis not present

## 2022-02-15 DIAGNOSIS — I4821 Permanent atrial fibrillation: Secondary | ICD-10-CM | POA: Diagnosis not present

## 2022-02-15 DIAGNOSIS — M545 Low back pain, unspecified: Secondary | ICD-10-CM | POA: Diagnosis not present

## 2022-02-15 DIAGNOSIS — I5032 Chronic diastolic (congestive) heart failure: Secondary | ICD-10-CM | POA: Diagnosis not present

## 2022-02-15 DIAGNOSIS — I495 Sick sinus syndrome: Secondary | ICD-10-CM | POA: Diagnosis not present

## 2022-02-15 DIAGNOSIS — K219 Gastro-esophageal reflux disease without esophagitis: Secondary | ICD-10-CM | POA: Diagnosis not present

## 2022-02-15 DIAGNOSIS — Z9981 Dependence on supplemental oxygen: Secondary | ICD-10-CM | POA: Diagnosis not present

## 2022-02-15 DIAGNOSIS — M810 Age-related osteoporosis without current pathological fracture: Secondary | ICD-10-CM | POA: Diagnosis not present

## 2022-02-15 DIAGNOSIS — E785 Hyperlipidemia, unspecified: Secondary | ICD-10-CM | POA: Diagnosis not present

## 2022-02-15 DIAGNOSIS — Z9181 History of falling: Secondary | ICD-10-CM | POA: Diagnosis not present

## 2022-02-15 DIAGNOSIS — Z7901 Long term (current) use of anticoagulants: Secondary | ICD-10-CM | POA: Diagnosis not present

## 2022-02-15 DIAGNOSIS — G2581 Restless legs syndrome: Secondary | ICD-10-CM | POA: Diagnosis not present

## 2022-02-15 DIAGNOSIS — E039 Hypothyroidism, unspecified: Secondary | ICD-10-CM | POA: Diagnosis not present

## 2022-02-15 DIAGNOSIS — N390 Urinary tract infection, site not specified: Secondary | ICD-10-CM | POA: Diagnosis not present

## 2022-02-15 DIAGNOSIS — I110 Hypertensive heart disease with heart failure: Secondary | ICD-10-CM | POA: Diagnosis not present

## 2022-02-15 DIAGNOSIS — F419 Anxiety disorder, unspecified: Secondary | ICD-10-CM | POA: Diagnosis not present

## 2022-02-15 DIAGNOSIS — M159 Polyosteoarthritis, unspecified: Secondary | ICD-10-CM | POA: Diagnosis not present

## 2022-02-19 DIAGNOSIS — N179 Acute kidney failure, unspecified: Secondary | ICD-10-CM | POA: Diagnosis not present

## 2022-02-19 DIAGNOSIS — I110 Hypertensive heart disease with heart failure: Secondary | ICD-10-CM | POA: Diagnosis not present

## 2022-02-19 DIAGNOSIS — J9601 Acute respiratory failure with hypoxia: Secondary | ICD-10-CM | POA: Diagnosis not present

## 2022-02-19 DIAGNOSIS — I5032 Chronic diastolic (congestive) heart failure: Secondary | ICD-10-CM | POA: Diagnosis not present

## 2022-02-19 DIAGNOSIS — I4821 Permanent atrial fibrillation: Secondary | ICD-10-CM | POA: Diagnosis not present

## 2022-02-19 DIAGNOSIS — J188 Other pneumonia, unspecified organism: Secondary | ICD-10-CM | POA: Diagnosis not present

## 2022-03-01 DIAGNOSIS — I110 Hypertensive heart disease with heart failure: Secondary | ICD-10-CM | POA: Diagnosis not present

## 2022-03-01 DIAGNOSIS — J9601 Acute respiratory failure with hypoxia: Secondary | ICD-10-CM | POA: Diagnosis not present

## 2022-03-01 DIAGNOSIS — J188 Other pneumonia, unspecified organism: Secondary | ICD-10-CM | POA: Diagnosis not present

## 2022-03-01 DIAGNOSIS — N179 Acute kidney failure, unspecified: Secondary | ICD-10-CM | POA: Diagnosis not present

## 2022-03-01 DIAGNOSIS — I5032 Chronic diastolic (congestive) heart failure: Secondary | ICD-10-CM | POA: Diagnosis not present

## 2022-03-01 DIAGNOSIS — I4821 Permanent atrial fibrillation: Secondary | ICD-10-CM | POA: Diagnosis not present

## 2022-03-07 DIAGNOSIS — N179 Acute kidney failure, unspecified: Secondary | ICD-10-CM | POA: Diagnosis not present

## 2022-03-07 DIAGNOSIS — I4821 Permanent atrial fibrillation: Secondary | ICD-10-CM | POA: Diagnosis not present

## 2022-03-07 DIAGNOSIS — J9601 Acute respiratory failure with hypoxia: Secondary | ICD-10-CM | POA: Diagnosis not present

## 2022-03-07 DIAGNOSIS — I5032 Chronic diastolic (congestive) heart failure: Secondary | ICD-10-CM | POA: Diagnosis not present

## 2022-03-07 DIAGNOSIS — I110 Hypertensive heart disease with heart failure: Secondary | ICD-10-CM | POA: Diagnosis not present

## 2022-03-07 DIAGNOSIS — J188 Other pneumonia, unspecified organism: Secondary | ICD-10-CM | POA: Diagnosis not present

## 2022-03-12 DIAGNOSIS — I4821 Permanent atrial fibrillation: Secondary | ICD-10-CM | POA: Diagnosis not present

## 2022-03-12 DIAGNOSIS — J9601 Acute respiratory failure with hypoxia: Secondary | ICD-10-CM | POA: Diagnosis not present

## 2022-03-12 DIAGNOSIS — N179 Acute kidney failure, unspecified: Secondary | ICD-10-CM | POA: Diagnosis not present

## 2022-03-12 DIAGNOSIS — I5032 Chronic diastolic (congestive) heart failure: Secondary | ICD-10-CM | POA: Diagnosis not present

## 2022-03-12 DIAGNOSIS — J188 Other pneumonia, unspecified organism: Secondary | ICD-10-CM | POA: Diagnosis not present

## 2022-03-12 DIAGNOSIS — I110 Hypertensive heart disease with heart failure: Secondary | ICD-10-CM | POA: Diagnosis not present

## 2022-03-13 ENCOUNTER — Ambulatory Visit: Payer: Medicare Other

## 2022-03-13 DIAGNOSIS — I495 Sick sinus syndrome: Secondary | ICD-10-CM | POA: Diagnosis not present

## 2022-03-13 LAB — CUP PACEART REMOTE DEVICE CHECK
Battery Remaining Longevity: 107 mo
Battery Remaining Percentage: 82 %
Battery Voltage: 3.02 V
Brady Statistic RV Percent Paced: 6.9 %
Date Time Interrogation Session: 20240306021431
Implantable Lead Connection Status: 753985
Implantable Lead Implant Date: 20080804
Implantable Lead Location: 753860
Implantable Pulse Generator Implant Date: 20210309
Lead Channel Impedance Value: 350 Ohm
Lead Channel Pacing Threshold Amplitude: 1.25 V
Lead Channel Pacing Threshold Pulse Width: 0.6 ms
Lead Channel Sensing Intrinsic Amplitude: 3.4 mV
Lead Channel Setting Pacing Amplitude: 2.5 V
Lead Channel Setting Pacing Pulse Width: 0.6 ms
Lead Channel Setting Sensing Sensitivity: 2 mV
Pulse Gen Model: 1272
Pulse Gen Serial Number: 9153964

## 2022-04-01 DIAGNOSIS — E7800 Pure hypercholesterolemia, unspecified: Secondary | ICD-10-CM | POA: Diagnosis not present

## 2022-04-01 DIAGNOSIS — I119 Hypertensive heart disease without heart failure: Secondary | ICD-10-CM | POA: Diagnosis not present

## 2022-04-01 DIAGNOSIS — J4530 Mild persistent asthma, uncomplicated: Secondary | ICD-10-CM | POA: Diagnosis not present

## 2022-04-01 DIAGNOSIS — K219 Gastro-esophageal reflux disease without esophagitis: Secondary | ICD-10-CM | POA: Diagnosis not present

## 2022-04-17 NOTE — Progress Notes (Signed)
Remote pacemaker transmission.   

## 2022-05-07 DIAGNOSIS — S20212A Contusion of left front wall of thorax, initial encounter: Secondary | ICD-10-CM | POA: Diagnosis not present

## 2022-06-11 DIAGNOSIS — J4530 Mild persistent asthma, uncomplicated: Secondary | ICD-10-CM | POA: Diagnosis not present

## 2022-06-11 DIAGNOSIS — E7800 Pure hypercholesterolemia, unspecified: Secondary | ICD-10-CM | POA: Diagnosis not present

## 2022-06-11 DIAGNOSIS — I119 Hypertensive heart disease without heart failure: Secondary | ICD-10-CM | POA: Diagnosis not present

## 2022-06-11 DIAGNOSIS — K219 Gastro-esophageal reflux disease without esophagitis: Secondary | ICD-10-CM | POA: Diagnosis not present

## 2022-06-11 DIAGNOSIS — M810 Age-related osteoporosis without current pathological fracture: Secondary | ICD-10-CM | POA: Diagnosis not present

## 2022-06-11 DIAGNOSIS — E039 Hypothyroidism, unspecified: Secondary | ICD-10-CM | POA: Diagnosis not present

## 2022-06-12 ENCOUNTER — Ambulatory Visit (INDEPENDENT_AMBULATORY_CARE_PROVIDER_SITE_OTHER): Payer: Medicare Other

## 2022-06-12 DIAGNOSIS — I441 Atrioventricular block, second degree: Secondary | ICD-10-CM

## 2022-06-12 LAB — CUP PACEART REMOTE DEVICE CHECK
Battery Remaining Longevity: 104 mo
Battery Remaining Percentage: 80 %
Battery Voltage: 3.02 V
Brady Statistic RV Percent Paced: 7.1 %
Date Time Interrogation Session: 20240605020018
Implantable Lead Connection Status: 753985
Implantable Lead Implant Date: 20080804
Implantable Lead Location: 753860
Implantable Pulse Generator Implant Date: 20210309
Lead Channel Impedance Value: 330 Ohm
Lead Channel Pacing Threshold Amplitude: 1.25 V
Lead Channel Pacing Threshold Pulse Width: 0.6 ms
Lead Channel Sensing Intrinsic Amplitude: 3.6 mV
Lead Channel Setting Pacing Amplitude: 2.5 V
Lead Channel Setting Pacing Pulse Width: 0.6 ms
Lead Channel Setting Sensing Sensitivity: 2 mV
Pulse Gen Model: 1272
Pulse Gen Serial Number: 9153964

## 2022-06-25 DIAGNOSIS — H15102 Unspecified episcleritis, left eye: Secondary | ICD-10-CM | POA: Diagnosis not present

## 2022-07-02 DIAGNOSIS — H15103 Unspecified episcleritis, bilateral: Secondary | ICD-10-CM | POA: Diagnosis not present

## 2022-07-05 NOTE — Progress Notes (Signed)
Remote pacemaker transmission.   

## 2022-07-31 ENCOUNTER — Ambulatory Visit: Admitting: Cardiovascular Disease

## 2022-08-12 DIAGNOSIS — H15002 Unspecified scleritis, left eye: Secondary | ICD-10-CM | POA: Diagnosis not present

## 2022-08-12 DIAGNOSIS — H2000 Unspecified acute and subacute iridocyclitis: Secondary | ICD-10-CM | POA: Diagnosis not present

## 2022-08-22 DIAGNOSIS — I119 Hypertensive heart disease without heart failure: Secondary | ICD-10-CM | POA: Diagnosis not present

## 2022-08-22 DIAGNOSIS — M549 Dorsalgia, unspecified: Secondary | ICD-10-CM | POA: Diagnosis not present

## 2022-08-22 DIAGNOSIS — D649 Anemia, unspecified: Secondary | ICD-10-CM | POA: Diagnosis not present

## 2022-08-22 DIAGNOSIS — R7309 Other abnormal glucose: Secondary | ICD-10-CM | POA: Diagnosis not present

## 2022-08-22 DIAGNOSIS — K140 Glossitis: Secondary | ICD-10-CM | POA: Diagnosis not present

## 2022-08-22 DIAGNOSIS — D6869 Other thrombophilia: Secondary | ICD-10-CM | POA: Diagnosis not present

## 2022-08-22 DIAGNOSIS — R300 Dysuria: Secondary | ICD-10-CM | POA: Diagnosis not present

## 2022-08-22 DIAGNOSIS — R809 Proteinuria, unspecified: Secondary | ICD-10-CM | POA: Diagnosis not present

## 2022-08-22 DIAGNOSIS — E039 Hypothyroidism, unspecified: Secondary | ICD-10-CM | POA: Diagnosis not present

## 2022-08-22 DIAGNOSIS — I482 Chronic atrial fibrillation, unspecified: Secondary | ICD-10-CM | POA: Diagnosis not present

## 2022-08-22 DIAGNOSIS — E7800 Pure hypercholesterolemia, unspecified: Secondary | ICD-10-CM | POA: Diagnosis not present

## 2022-08-22 DIAGNOSIS — F411 Generalized anxiety disorder: Secondary | ICD-10-CM | POA: Diagnosis not present

## 2022-08-27 DIAGNOSIS — H15102 Unspecified episcleritis, left eye: Secondary | ICD-10-CM | POA: Diagnosis not present

## 2022-09-11 ENCOUNTER — Ambulatory Visit (INDEPENDENT_AMBULATORY_CARE_PROVIDER_SITE_OTHER): Payer: Medicare Other

## 2022-09-11 DIAGNOSIS — I441 Atrioventricular block, second degree: Secondary | ICD-10-CM

## 2022-09-11 LAB — CUP PACEART REMOTE DEVICE CHECK
Battery Remaining Longevity: 102 mo
Battery Remaining Percentage: 79 %
Battery Voltage: 3.02 V
Brady Statistic RV Percent Paced: 7.4 %
Date Time Interrogation Session: 20240904020016
Implantable Lead Connection Status: 753985
Implantable Lead Implant Date: 20080804
Implantable Lead Location: 753860
Implantable Pulse Generator Implant Date: 20210309
Lead Channel Impedance Value: 330 Ohm
Lead Channel Pacing Threshold Amplitude: 1.25 V
Lead Channel Pacing Threshold Pulse Width: 0.6 ms
Lead Channel Sensing Intrinsic Amplitude: 3.1 mV
Lead Channel Setting Pacing Amplitude: 2.5 V
Lead Channel Setting Pacing Pulse Width: 0.6 ms
Lead Channel Setting Sensing Sensitivity: 2 mV
Pulse Gen Model: 1272
Pulse Gen Serial Number: 9153964

## 2022-09-14 ENCOUNTER — Other Ambulatory Visit: Payer: Self-pay | Admitting: Cardiology

## 2022-09-16 ENCOUNTER — Other Ambulatory Visit: Payer: Self-pay | Admitting: Cardiology

## 2022-09-24 NOTE — Progress Notes (Signed)
Remote pacemaker transmission.   

## 2022-10-03 DIAGNOSIS — E039 Hypothyroidism, unspecified: Secondary | ICD-10-CM | POA: Diagnosis not present

## 2022-10-03 DIAGNOSIS — Z23 Encounter for immunization: Secondary | ICD-10-CM | POA: Diagnosis not present

## 2022-10-19 ENCOUNTER — Other Ambulatory Visit: Payer: Self-pay | Admitting: Cardiology

## 2022-11-28 DIAGNOSIS — Z23 Encounter for immunization: Secondary | ICD-10-CM | POA: Diagnosis not present

## 2022-12-11 ENCOUNTER — Ambulatory Visit (INDEPENDENT_AMBULATORY_CARE_PROVIDER_SITE_OTHER): Payer: Medicare Other

## 2022-12-11 DIAGNOSIS — I441 Atrioventricular block, second degree: Secondary | ICD-10-CM | POA: Diagnosis not present

## 2022-12-11 LAB — CUP PACEART REMOTE DEVICE CHECK
Battery Remaining Longevity: 100 mo
Battery Remaining Percentage: 77 %
Battery Voltage: 3.02 V
Brady Statistic RV Percent Paced: 7.9 %
Date Time Interrogation Session: 20241204020013
Implantable Lead Connection Status: 753985
Implantable Lead Implant Date: 20080804
Implantable Lead Location: 753860
Implantable Pulse Generator Implant Date: 20210309
Lead Channel Impedance Value: 330 Ohm
Lead Channel Pacing Threshold Amplitude: 1.25 V
Lead Channel Pacing Threshold Pulse Width: 0.6 ms
Lead Channel Sensing Intrinsic Amplitude: 4.7 mV
Lead Channel Setting Pacing Amplitude: 2.5 V
Lead Channel Setting Pacing Pulse Width: 0.6 ms
Lead Channel Setting Sensing Sensitivity: 2 mV
Pulse Gen Model: 1272
Pulse Gen Serial Number: 9153964

## 2022-12-23 DIAGNOSIS — H40052 Ocular hypertension, left eye: Secondary | ICD-10-CM | POA: Diagnosis not present

## 2023-02-10 DIAGNOSIS — R5383 Other fatigue: Secondary | ICD-10-CM | POA: Diagnosis not present

## 2023-02-24 DIAGNOSIS — G4733 Obstructive sleep apnea (adult) (pediatric): Secondary | ICD-10-CM | POA: Diagnosis not present

## 2023-02-24 DIAGNOSIS — D649 Anemia, unspecified: Secondary | ICD-10-CM | POA: Diagnosis not present

## 2023-02-24 DIAGNOSIS — H3530 Unspecified macular degeneration: Secondary | ICD-10-CM | POA: Diagnosis not present

## 2023-02-24 DIAGNOSIS — R7309 Other abnormal glucose: Secondary | ICD-10-CM | POA: Diagnosis not present

## 2023-02-24 DIAGNOSIS — Z0000 Encounter for general adult medical examination without abnormal findings: Secondary | ICD-10-CM | POA: Diagnosis not present

## 2023-02-24 DIAGNOSIS — F411 Generalized anxiety disorder: Secondary | ICD-10-CM | POA: Diagnosis not present

## 2023-02-24 DIAGNOSIS — E039 Hypothyroidism, unspecified: Secondary | ICD-10-CM | POA: Diagnosis not present

## 2023-02-24 DIAGNOSIS — I119 Hypertensive heart disease without heart failure: Secondary | ICD-10-CM | POA: Diagnosis not present

## 2023-02-24 DIAGNOSIS — I482 Chronic atrial fibrillation, unspecified: Secondary | ICD-10-CM | POA: Diagnosis not present

## 2023-02-24 DIAGNOSIS — E7800 Pure hypercholesterolemia, unspecified: Secondary | ICD-10-CM | POA: Diagnosis not present

## 2023-02-24 DIAGNOSIS — J4530 Mild persistent asthma, uncomplicated: Secondary | ICD-10-CM | POA: Diagnosis not present

## 2023-02-24 DIAGNOSIS — H547 Unspecified visual loss: Secondary | ICD-10-CM | POA: Diagnosis not present

## 2023-03-12 ENCOUNTER — Ambulatory Visit: Payer: BLUE CROSS/BLUE SHIELD

## 2023-03-12 DIAGNOSIS — I441 Atrioventricular block, second degree: Secondary | ICD-10-CM | POA: Diagnosis not present

## 2023-03-14 LAB — CUP PACEART REMOTE DEVICE CHECK
Battery Remaining Longevity: 98 mo
Battery Remaining Percentage: 76 %
Battery Voltage: 3.02 V
Brady Statistic RV Percent Paced: 8.3 %
Date Time Interrogation Session: 20250305020013
Implantable Lead Connection Status: 753985
Implantable Lead Implant Date: 20080804
Implantable Lead Location: 753860
Implantable Pulse Generator Implant Date: 20210309
Lead Channel Impedance Value: 330 Ohm
Lead Channel Pacing Threshold Amplitude: 1.25 V
Lead Channel Pacing Threshold Pulse Width: 0.6 ms
Lead Channel Sensing Intrinsic Amplitude: 3 mV
Lead Channel Setting Pacing Amplitude: 2.5 V
Lead Channel Setting Pacing Pulse Width: 0.6 ms
Lead Channel Setting Sensing Sensitivity: 2 mV
Pulse Gen Model: 1272
Pulse Gen Serial Number: 9153964

## 2023-04-01 DIAGNOSIS — E538 Deficiency of other specified B group vitamins: Secondary | ICD-10-CM | POA: Diagnosis not present

## 2023-04-01 DIAGNOSIS — D649 Anemia, unspecified: Secondary | ICD-10-CM | POA: Diagnosis not present

## 2023-04-07 DIAGNOSIS — H15102 Unspecified episcleritis, left eye: Secondary | ICD-10-CM | POA: Diagnosis not present

## 2023-04-16 ENCOUNTER — Ambulatory Visit: Admitting: Cardiovascular Disease

## 2023-04-25 NOTE — Addendum Note (Signed)
 Addended by: Lott Rouleau A on: 04/25/2023 10:09 AM   Modules accepted: Orders

## 2023-04-25 NOTE — Progress Notes (Signed)
 Remote pacemaker transmission.

## 2023-06-11 ENCOUNTER — Ambulatory Visit (INDEPENDENT_AMBULATORY_CARE_PROVIDER_SITE_OTHER): Payer: Medicare Other

## 2023-06-11 DIAGNOSIS — I441 Atrioventricular block, second degree: Secondary | ICD-10-CM | POA: Diagnosis not present

## 2023-06-11 LAB — CUP PACEART REMOTE DEVICE CHECK
Battery Remaining Longevity: 96 mo
Battery Remaining Percentage: 74 %
Battery Voltage: 3.02 V
Brady Statistic RV Percent Paced: 8.6 %
Date Time Interrogation Session: 20250604025407
Implantable Lead Connection Status: 753985
Implantable Lead Implant Date: 20080804
Implantable Lead Location: 753860
Implantable Pulse Generator Implant Date: 20210309
Lead Channel Impedance Value: 330 Ohm
Lead Channel Pacing Threshold Amplitude: 1.25 V
Lead Channel Pacing Threshold Pulse Width: 0.6 ms
Lead Channel Sensing Intrinsic Amplitude: 3.6 mV
Lead Channel Setting Pacing Amplitude: 2.5 V
Lead Channel Setting Pacing Pulse Width: 0.6 ms
Lead Channel Setting Sensing Sensitivity: 2 mV
Pulse Gen Model: 1272
Pulse Gen Serial Number: 9153964

## 2023-06-20 ENCOUNTER — Ambulatory Visit: Payer: Self-pay | Admitting: Cardiology

## 2023-07-01 DIAGNOSIS — H548 Legal blindness, as defined in USA: Secondary | ICD-10-CM | POA: Diagnosis not present

## 2023-07-01 DIAGNOSIS — D3132 Benign neoplasm of left choroid: Secondary | ICD-10-CM | POA: Diagnosis not present

## 2023-07-01 DIAGNOSIS — Z961 Presence of intraocular lens: Secondary | ICD-10-CM | POA: Diagnosis not present

## 2023-07-01 DIAGNOSIS — H40012 Open angle with borderline findings, low risk, left eye: Secondary | ICD-10-CM | POA: Diagnosis not present

## 2023-07-01 DIAGNOSIS — H401413 Capsular glaucoma with pseudoexfoliation of lens, right eye, severe stage: Secondary | ICD-10-CM | POA: Diagnosis not present

## 2023-07-31 ENCOUNTER — Encounter: Admitting: Cardiovascular Disease

## 2023-08-04 NOTE — Progress Notes (Signed)
 Remote pacemaker transmission.

## 2023-08-27 ENCOUNTER — Ambulatory Visit: Admitting: Cardiology

## 2023-08-27 ENCOUNTER — Encounter: Payer: Self-pay | Admitting: Cardiology

## 2023-08-27 ENCOUNTER — Ambulatory Visit: Payer: Self-pay | Attending: Cardiology | Admitting: Cardiovascular Disease

## 2023-08-27 VITALS — BP 126/60 | HR 68 | Ht 62.0 in | Wt 120.6 lb

## 2023-08-27 DIAGNOSIS — I10 Essential (primary) hypertension: Secondary | ICD-10-CM | POA: Insufficient documentation

## 2023-08-27 DIAGNOSIS — I495 Sick sinus syndrome: Secondary | ICD-10-CM

## 2023-08-27 DIAGNOSIS — I350 Nonrheumatic aortic (valve) stenosis: Secondary | ICD-10-CM

## 2023-08-27 DIAGNOSIS — Z79899 Other long term (current) drug therapy: Secondary | ICD-10-CM | POA: Insufficient documentation

## 2023-08-27 DIAGNOSIS — I951 Orthostatic hypotension: Secondary | ICD-10-CM | POA: Insufficient documentation

## 2023-08-27 DIAGNOSIS — Z950 Presence of cardiac pacemaker: Secondary | ICD-10-CM | POA: Insufficient documentation

## 2023-08-27 DIAGNOSIS — I5032 Chronic diastolic (congestive) heart failure: Secondary | ICD-10-CM | POA: Insufficient documentation

## 2023-08-27 DIAGNOSIS — I482 Chronic atrial fibrillation, unspecified: Secondary | ICD-10-CM | POA: Insufficient documentation

## 2023-08-27 DIAGNOSIS — I441 Atrioventricular block, second degree: Secondary | ICD-10-CM | POA: Diagnosis not present

## 2023-08-27 NOTE — Progress Notes (Signed)
 Date:  08/27/2023   ID:  Anna Farrell, DOB 1927-06-12, MRN [id number] The patient was identified using 2 identifiers.  PCP:  Loreli Kins, MD   [facility] Providers Cardiologist:  Wilbert Bihari, MD Electrophysiologist:  Lynwood Rakers, MD (Inactive)     Evaluation Performed:  Follow-Up Visit  Chief Complaint:  atrial fibrillation, HTN, CHF  History of Present Illness:    Anna Farrell is a 89 y.o. female with  a hx of chronic atrial fibrillation, orthostatic hypotension, PPM, asymptomatic PVC's and severe OSA . She was admited in July 22 with CP and shoulder pain and was found to be in afib with RVR.  hsTrop was neg x 2.  At that time her coumadin  was changed to Xarelto .  It was felt her CP was due to MSK etiology.    She is here today doing well.  She has chronic SOB that is stable.  She complains of leg weakness when she walks. She denies any chest pain or pressure, PND, orthopnea, lower extremedema, dizziness, palpitations or syncope.  Past Medical History:  Diagnosis Date   Asymptomatic PVCs    Cholelithiasis    Chronic low back pain 2008   crushed vertebrae   Colitis 5/13   Extrinsic asthma, unspecified    LOV Dr Brien 7/12   Generalized osteoarthrosis, unspecified site    History of colonic polyps    Hyperlipidemia    Hypertension    Iron  deficiency anemia    Long term (current) use of anticoagulants    Orthostatic hypotension    Osteoporosis    Pacemaker 10/11/06   implanted by Dr Malva for pauses > 4 seconds with afib   Pancreatitis    Permanent atrial fibrillation (HCC)    Tachycardia-bradycardia syndrome (HCC)    Vasovagal syncope    Past Surgical History:  Procedure Laterality Date   ABDOMINAL HYSTERECTOMY     APPENDECTOMY     CARDIAC CATHETERIZATION  2009   Normal coronary arteries   CHOLECYSTECTOMY  09/17/2011   Procedure: LAPAROSCOPIC CHOLECYSTECTOMY;  Surgeon: Donnice Bury, MD;  Location: [facility];  Service: General;;   COLONOSCOPY      ERCP N/A 04/07/2013   Procedure: ENDOSCOPIC RETROGRADE CHOLANGIOPANCREATOGRAPHY (ERCP);  Surgeon: Oliva FORBES Boots, MD;  Location: [facility];  Service: Endoscopy;  Laterality: N/A;   EUS  04/07/2013   Procedure: UPPER ENDOSCOPIC ULTRASOUND (EUS) RADIAL;  Surgeon: Oliva FORBES Boots, MD;  Location: [facility];  Service: Endoscopy;;   HIP ARTHROPLASTY Left 08/17/2013   Procedure: ARTHROPLASTY BIPOLAR HIP;  Surgeon: Evalene JONETTA Chancy, MD;  Location: [facility] OR;  Service: Orthopedics;  Laterality: Left;   PACEMAKER INSERTION  10/11/2006   SJM Zephyr XL DR implanted by Dr Malva for afib with pause > 4 seconds   PPM GENERATOR CHANGEOUT N/A 03/16/2019   Procedure: PPM GENERATOR CHANGEOUT;  Surgeon: Rakers Lynwood, MD;  Location: [facility] INVASIVE CV [facility];  Service: Cardiovascular;  Laterality: N/A;   VESICOVAGINAL FISTULA CLOSURE W/ TAH  1981     Current Meds  Medication Sig   acetaminophen  (TYLENOL ) 325 MG tablet Take 500 mg by mouth every 6 (six) hours as needed. (Patient taking differently: Take 500 mg by mouth every 6 (six) hours as needed. Pt stats takes 1 tablet daily about every day)   ALPRAZolam  (XANAX ) 0.25 MG tablet Take 0.25 mg by mouth 3 (three) times daily as needed (at night for restless legs, only takes at night up to 3 tablets).   amLODipine  (NORVASC ) 5  MG tablet TAKE 1 TABLET(5 MG) BY MOUTH DAILY   Cyanocobalamin  1000 MCG/15ML LIQD Take by mouth.   lisinopril  (ZESTRIL ) 40 MG tablet Take 20 mg by mouth daily.   metoprolol  succinate (TOPROL -XL) 100 MG 24 hr tablet Take 100 mg daily by mouth. Take with or immediately following a meal.   Multiple Vitamins-Minerals (EYE VITAMINS PO) Take 1 tablet by mouth in the morning and at bedtime. preservision   ondansetron  (ZOFRAN ) 8 MG tablet Take 8 mg by mouth daily as needed (Car sick).    pravastatin  (PRAVACHOL ) 40 MG tablet Take 1 tablet (40 mg total) by mouth daily. Once daily   Rivaroxaban  (XARELTO ) 15 MG TABS tablet TAKE 1 TABLET(15 MG) BY MOUTH DAILY WITH SUPPER    traMADol  (ULTRAM ) 50 MG tablet Take 100 mg by mouth every 6 (six) hours as needed (max 8 tablets per day).     Allergies:   Codeine, Betadine [povidone iodine], and Rocephin  [ceftriaxone  sodium in dextrose ]   Social History   Tobacco Use   Smoking status: Never   Smokeless tobacco: Never  Substance Use Topics   Alcohol use: No   Drug use: No     Family Hx: The patient's family history includes Cancer in her mother; Hypertension in her mother; Pancreatitis in her mother.  ROS:   Please see the history of present illness.     All other systems reviewed and are negative.   Prior CV studies:   The following studies were reviewed today:  Hospital notes 7/22  Labs/Other Tests and Data Reviewed:    EKG Interpretation Date/Time:  Wednesday August 27 2023 15:16:02 EDT Ventricular Rate:  68 PR Interval:    QRS Duration:  84 QT Interval:  400 QTC Calculation: 425 R Axis:   -15  Text Interpretation: Atrial fibrillation Low voltage QRS Cannot rule out Anterior infarct (cited on or before 28-Jul-2020) When compared with ECG of 08-Jan-2022 12:14, Serial changes of Anterior infarct Present Confirmed by Shlomo Corning ([id number]) on 08/27/2023 3:25:17 PM    Recent Labs: No results found for requested labs within last 365 days.   Recent Lipid Panel No results found for: CHOL, TRIG, HDL, CHOLHDL, LDLCALC, LDLDIRECT  Wt Readings from Last 3 Encounters:  08/27/23 120 lb 9.6 oz (54.7 kg)  01/08/22 134 lb 14.7 oz (61.2 kg)  08/10/21 135 lb (61.2 kg)     Objective:    Vital Signs:  BP 126/60   Pulse 68   Ht 5' 2 (1.575 m)   Wt 120 lb 9.6 oz (54.7 kg)   SpO2 93%   BMI 22.06 kg/m   GEN: Well nourished, well developed in no acute distress HEENT: Normal NECK: No JVD; No carotid bruits LYMPHATICS: No lymphadenopathy CARDIAC:RRR, no murmurs, rubs, gallops RESPIRATORY:  Clear to auscultation without rales, wheezing or rhonchi  ABDOMEN: Soft, non-tender,  non-distended MUSCULOSKELETAL:  No edema; No deformity  SKIN: Warm and dry NEUROLOGIC:  Alert and oriented x 3 PSYCHIATRIC:  Normal affect  ASSESSMENT & PLAN:    Chronic atrial fibrillation - She remains in atrial fibrillation with good heart rate control - Denies any palpitations or bleeding issues on DOAC - Continue Toprol  XL 100 mg daily and Xarelto  15 mg daily with as needed refills - check BMET  2.  OSA  -she no longer uses CPAP which I think is fine given her advanced age  110. HTN - BP is adequately controlled on exam today - Continue amlodipine  5 mg daily, lisinopril  20 mg daily,  Toprol -XL 100 mg daily with as needed refills  4.  Tachybrady syndrome -s/p PPM -Followed in our device clinic   5.  Orthostatic hypotension - has not had any further dizzy episodes  6.  Chronic diastolic CHF -She appears euvolemic on exam today -2D echo 01/26/2022 showed EF 65 to 70% with mild LVH, moderate LAE and mild aortic valve stenosis -Will repeat 2D echo to reassess aortic valve -Continue Toprol  XL 100 mg daily and Zestril  20 mg daily -No SGLT2i due to frequent UTIs and advanced age     Medication Adjustments/Labs and Tests Ordered: Current medicines are reviewed at length with the patient today.  Concerns regarding medicines are outlined above.   Tests Ordered: Orders Placed This Encounter  Procedures   EKG 12-Lead    Medication Changes: No orders of the defined types were placed in this encounter.   Follow Up:  In Person in 1 year(s)  Signed, Wilbert Bihari, MD  08/27/2023 3:25 PM    [facility]

## 2023-08-27 NOTE — Patient Instructions (Signed)
 Medication Instructions:  Your physician recommends that you continue on your current medications as directed. Please refer to the Current Medication list given to you today.  *If you need a refill on your cardiac medications before your next appointment, please call your pharmacy*  Lab Work: None ordered.  If you have labs (blood work) drawn today and your tests are completely normal, you will receive your results only by: MyChart Message (if you have MyChart) OR A paper copy in the mail If you have any lab test that is abnormal or we need to change your treatment, we will call you to review the results.  Testing/Procedures: None ordered.   Follow-Up: At [facility], you and your health needs are our priority.  As part of our continuing mission to provide you with exceptional heart care, our providers are all part of one team.  This team includes your primary Cardiologist (physician) and Advanced Practice Providers or APPs (Physician Assistants and Nurse Practitioners) who all work together to provide you with the care you need, when you need it.  Your next appointment:   2 years with Dr Mealor

## 2023-08-27 NOTE — Patient Instructions (Signed)
 Medication Instructions:  Your physician recommends that you continue on your current medications as directed. Please refer to the Current Medication list given to you today.  *If you need a refill on your cardiac medications before your next appointment, please call your pharmacy*  Lab Work: Please complete a BMET as soon as you can within the next 2 weeks.  If you have labs (blood work) drawn today and your tests are completely normal, you will receive your results only by: MyChart Message (if you have MyChart) OR A paper copy in the mail If you have any lab test that is abnormal or we need to change your treatment, we will call you to review the results.  Testing/Procedures: Your physician has requested that you have an echocardiogram in January 2026. Echocardiography is a painless test that uses sound waves to create images of your heart. It provides your doctor with information about the size and shape of your heart and how well your heart's chambers and valves are working. This procedure takes approximately one hour. There are no restrictions for this procedure. Please do NOT wear cologne, perfume, aftershave, or lotions (deodorant is allowed). Please arrive 15 minutes prior to your appointment time.  Please note: We ask at that you not bring children with you during ultrasound (echo/ vascular) testing. Due to room size and safety concerns, children are not allowed in the ultrasound rooms during exams. Our front office staff cannot provide observation of children in our lobby area while testing is being conducted. An adult accompanying a patient to their appointment will only be allowed in the ultrasound room at the discretion of the ultrasound technician under special circumstances. We apologize for any inconvenience.   Follow-Up: At [facility], you and your health needs are our priority.  As part of our continuing mission to provide you with exceptional heart care, our  providers are all part of one team.  This team includes your primary Cardiologist (physician) and Advanced Practice Providers or APPs (Physician Assistants and Nurse Practitioners) who all work together to provide you with the care you need, when you need it.  Your next appointment:   1 year(s)  Provider:   Wilbert Bihari, MD    We recommend signing up for the patient portal called MyChart.  Sign up information is provided on this After Visit Summary.  MyChart is used to connect with patients for Virtual Visits (Telemedicine).  Patients are able to view lab/test results, encounter notes, upcoming appointments, etc.  Non-urgent messages can be sent to your provider as well.   To learn more about what you can do with MyChart, go to [email or web address].

## 2023-08-27 NOTE — Progress Notes (Signed)
  Electrophysiology Office Note:    Date:  08/27/2023   ID:  Anna Farrell, Anna Farrell Jan 04, 1928, MRN [id number]  PCP:  Loreli Kins, MD   [facility] Providers Cardiologist:  Wilbert Bihari, MD Electrophysiologist:  Lynwood Rakers, MD (Inactive)     Referring MD: Loreli Kins, MD   History of Present Illness:    Anna Farrell is a 89 y.o. female with a medical history significant for Abbott VVI pacemaker, permanent atrial fibrillation, asymptomatic PVCs, severe obstructive sleep apnea who presents for .      Discussed the use of AI scribe software for clinical note transcription with the patient, who gave verbal consent to proceed.  History of Present Illness Anna Farrell is a 89 year old female with permanent atrial fibrillation who presents for device follow up.  She has permanent atrial fibrillation and an avid single chamber pacemaker. In July 2022, she experienced atrial fibrillation with rapid ventricular response and was admitted with chest pain.  She underwent a pacemaker change during the COVID pandemic, which she describes as a difficult experience.  Walking is painful, and she uses a walker. She experiences pain when standing up but otherwise has no other significant pain. She lives in a small house built by her children in her daughter's backyard, which she finds convenient and accessible. She manages her daily activities with the help of her son and daughter.         Today, she reports that she is doing well and has no complaints.  She was born and raised in [place] and worked for [employer] for over 30 years.  EKGs/Labs/Other Studies Reviewed Today:       EKG:         Physical Exam:    VS:  BP 126/60 (BP Location: Left Arm, Patient Position: Sitting, Cuff Size: Normal)   Pulse 68   Ht 5' 2 (1.575 m)   Wt 120 lb 9.6 oz (54.7 kg)   SpO2 93%   BMI 22.06 kg/m     Wt Readings from Last 3 Encounters:  08/27/23 120 lb 9.6 oz (54.7 kg)   08/27/23 120 lb 9.6 oz (54.7 kg)  01/08/22 134 lb 14.7 oz (61.2 kg)     GEN: Well nourished, well developed in no acute distress CARDIAC: iRRR, no murmurs, rubs, gallops RESPIRATORY:  Normal work of breathing MUSCULOSKELETAL: no edema    ASSESSMENT & PLAN:     Complete heart block Abbott single-chamber pacemaker in place functioning normally I reviewed today's interrogation.  See Paceart for details No programming changes She is device dependent  Permanent atrial fibrillation Rate well-controlled Continue Xarelto  15 mg daily with supper      Signed, Eulas FORBES Furbish, MD  08/27/2023 4:48 PM    [facility]

## 2023-08-27 NOTE — Addendum Note (Signed)
 Addended by: JANIT GENI CROME on: 08/27/2023 03:40 PM   Modules accepted: Orders

## 2023-09-02 DIAGNOSIS — R300 Dysuria: Secondary | ICD-10-CM | POA: Diagnosis not present

## 2023-09-07 DIAGNOSIS — R300 Dysuria: Secondary | ICD-10-CM | POA: Diagnosis not present

## 2023-09-07 DIAGNOSIS — R079 Chest pain, unspecified: Secondary | ICD-10-CM | POA: Diagnosis not present

## 2023-09-08 NOTE — Progress Notes (Signed)
 Anna Farrell is a 89 y.o.  with  reports that she has never smoked. She has never been exposed to tobacco smoke. She has never used smokeless tobacco. with  Active Ambulatory Problems    Diagnosis Date Noted  . No Active Ambulatory Problems   Resolved Ambulatory Problems    Diagnosis Date Noted  . No Resolved Ambulatory Problems   Past Medical History:  Diagnosis Date  . Congestive heart failure    (CMD)   . Pneumonia   . Visual impairment    who presents today at Urgent Care for  Chief Complaint  Patient presents with  . Dehydration    Patient here for concerns for dehydration. Patient with cold symptoms x1 week. Family reports she has not been wanting to eat or drink much, but has had increased urinary incontinence.   . Urinary Tract Infection    Patient diagnosed with a UTI 4 days ago and started on Cipro . Has currently taken approx. 3 days worth of medication. Patient reports increased fatigue, mild confusion, and some difficulty hearing.   . Cough    Cough x1 week. Patient concerned she may be developing pneumonia again as she is experiencing mild shortness of breath and chest tightness.        History of Present Illness This is a 89 year old female with a history of urinary tract infection presenting with concerns of developing pneumonia.  The patient was diagnosed with a urinary tract infection 4 days ago, confirmed by a urine culture. She has been prescribed Cipro  500 mg twice daily for this condition. Since then, she has developed a cough and is experiencing mild shortness of breath and chest tightness, raising concerns about possible pneumonia.  The patient reports significant weakness, stating she is just so weak I can't hardly hold my head up. Her appetite has been poor, and she has not consumed enough food or fluids in the past 5 days. Despite efforts to encourage her to drink more, she remains resistant. She has been consuming Bodyarmor, as she dislikes Gatorade.  Additionally, she had influenza in 02/2023, which led to dehydration and fainting at home. Emergency services were called at that time, and she was administered a bag of fluids at home.      Patient has no co-morbidities  or medications that might increase the risk for developing a severe infection     reports that she has never smoked. She has never been exposed to tobacco smoke. She has never used smokeless tobacco.  Review of Systems  Review of systems is otherwise negative except as noted in the HPI and Assessment/MDM   Physical Exam  BP 117/74 (BP Location: Left arm, Patient Position: Sitting)   Pulse 69   Temp 97.5 F (36.4 C) (Oral)   Resp 15   Ht 1.575 m (5' 2)   Wt 54.4 kg (120 lb)   SpO2 100%   BMI 21.95 kg/m   Constitutional:      General: Patient is not in acute distress.    Appearance: Normal appearance.  Neurological:     General: No focal deficit present.     Mental Status: alert and oriented to person, place, and time.  HENT:     Eyes:     Conjunctiva/sclera: Conjunctivae normal. Pharynx normal    There is no associated anterior cervical lymphadenopathy    Pupils: Pupils are equal, round, and reactive to light.     TM's normal with no visible effusion  Cardiovascular:  Heart sounds: Normal heart sounds. No murmur heard.    No Lower extremity edema noted Pulmonary:     Effort: Pulmonary effort is normal. No respiratory distress.     Breath sounds: No wheezing, rhonchi or rales.  Abdominal:     General: Bowel sounds are normal. There is no distension.     Tenderness: There is no abdominal tenderness. Musculoskeletal:        General: Normal range of motion.     Neck:  No rigidity.  Skin:    General: Skin is warm and dry.  Psychiatric:        Mood and Affect: Mood normal.   XR Chest 2 Views  Final Result by Eduard Vikki Guppy, MD (08/31 2037)  XR CHEST 2 VIEWS, 09/07/2023 7:47 PM    INDICATION: Chest pain, unspecified \ R07.9 Chest pain,  unspecified   COMPARISON: None.    FINDINGS:  Limited exam secondary to patient habitus.    . Supportive devices: Left subclavian approach dual lead cardiac rhythm   maintenance device with leads projecting over the right atrium and right   ventricle.  . Cardiovascular / mediastinum: Cardiomediastinal silhouette within normal   limits.  . Lungs / pleura: No focal airspace consolidation. No pneumothorax. No   pleural effusion.  SABRA Upper abdomen: Unremarkable.  . Bones & soft tissues: No acute displaced fractures.    IMPRESSION:  CONCLUSION:  Within confines of an exam limited by patient positioning, no focal   pulmonary consolidation.              DIAGNOSIS/PLAN     1. Chest pain, unspecified type  ECG 12 lead   XR Chest 2 Views    2. Dysuria  CANCELED: POC Urinalysis Auto without Microscopic      Assessment & Plan Initial Assessment: 89 year old female with urinary tract infection diagnosed 4 days ago, currently on Ciprofloxacin . Developed cough, mild shortness of breath, and chest tightness. Concern for pneumonia.  Differential Diagnosis: - Pneumonia   - Cough, mild shortness of breath, chest tightness   - Chest x-ray normal   - Ciprofloxacin  effective for typical pneumonia   - Z-Pak (Azithromycin) for atypical pneumonia  ED Course: - Chest x-ray performed, results normal  Final Assessment: Chest x-ray performed, results normal. Ciprofloxacin  effective for typical pneumonia. Z-Pak (Azithromycin) added for atypical pneumonia.  Clinical Impression: - Urinary tract infection - Pneumonia - Dehydration  Disposition: - Discharge: Home. Advised to return if condition worsens.  Patient Education: Increase fluid intake, including Pedialyte or Gatorade with more electrolytes.  Portions of this note were created using the aid of voice recognition Dragon/DAX dictation software.   We discussed risks and side effects of medications, and also discussed red  flags which would warrant immediate follow-up.   Urgent Care Disposition:  Home Care

## 2023-09-10 ENCOUNTER — Ambulatory Visit (INDEPENDENT_AMBULATORY_CARE_PROVIDER_SITE_OTHER): Payer: Medicare Other

## 2023-09-10 DIAGNOSIS — I482 Chronic atrial fibrillation, unspecified: Secondary | ICD-10-CM

## 2023-09-10 DIAGNOSIS — E860 Dehydration: Secondary | ICD-10-CM | POA: Diagnosis not present

## 2023-09-10 DIAGNOSIS — J988 Other specified respiratory disorders: Secondary | ICD-10-CM | POA: Diagnosis not present

## 2023-09-10 DIAGNOSIS — N390 Urinary tract infection, site not specified: Secondary | ICD-10-CM | POA: Diagnosis not present

## 2023-09-10 DIAGNOSIS — J4530 Mild persistent asthma, uncomplicated: Secondary | ICD-10-CM | POA: Diagnosis not present

## 2023-09-10 DIAGNOSIS — I119 Hypertensive heart disease without heart failure: Secondary | ICD-10-CM | POA: Diagnosis not present

## 2023-09-10 DIAGNOSIS — E46 Unspecified protein-calorie malnutrition: Secondary | ICD-10-CM | POA: Diagnosis not present

## 2023-09-10 DIAGNOSIS — R627 Adult failure to thrive: Secondary | ICD-10-CM | POA: Diagnosis not present

## 2023-09-11 ENCOUNTER — Ambulatory Visit: Payer: Self-pay | Admitting: Cardiology

## 2023-09-11 LAB — CUP PACEART REMOTE DEVICE CHECK
Battery Remaining Longevity: 92 mo
Battery Remaining Percentage: 73 %
Battery Voltage: 3.02 V
Brady Statistic RV Percent Paced: 13 %
Date Time Interrogation Session: 20250903020015
Implantable Lead Connection Status: 753985
Implantable Lead Implant Date: 20080804
Implantable Lead Location: 753860
Implantable Pulse Generator Implant Date: 20210309
Lead Channel Impedance Value: 310 Ohm
Lead Channel Pacing Threshold Amplitude: 1.25 V
Lead Channel Pacing Threshold Pulse Width: 0.6 ms
Lead Channel Sensing Intrinsic Amplitude: 3.4 mV
Lead Channel Setting Pacing Amplitude: 2.5 V
Lead Channel Setting Pacing Pulse Width: 0.6 ms
Lead Channel Setting Sensing Sensitivity: 2 mV
Pulse Gen Model: 1272
Pulse Gen Serial Number: 9153964

## 2023-09-16 NOTE — Progress Notes (Signed)
 Remote PPM Transmission

## 2023-09-22 DIAGNOSIS — R32 Unspecified urinary incontinence: Secondary | ICD-10-CM | POA: Diagnosis not present

## 2023-09-22 DIAGNOSIS — E7800 Pure hypercholesterolemia, unspecified: Secondary | ICD-10-CM | POA: Diagnosis not present

## 2023-09-22 DIAGNOSIS — R7303 Prediabetes: Secondary | ICD-10-CM | POA: Diagnosis not present

## 2023-09-22 DIAGNOSIS — B029 Zoster without complications: Secondary | ICD-10-CM | POA: Diagnosis not present

## 2023-09-22 DIAGNOSIS — I119 Hypertensive heart disease without heart failure: Secondary | ICD-10-CM | POA: Diagnosis not present

## 2023-09-22 DIAGNOSIS — E039 Hypothyroidism, unspecified: Secondary | ICD-10-CM | POA: Diagnosis not present

## 2023-09-22 DIAGNOSIS — E538 Deficiency of other specified B group vitamins: Secondary | ICD-10-CM | POA: Diagnosis not present

## 2023-09-22 LAB — LAB REPORT - SCANNED
A1c: 6
EGFR: 81

## 2023-12-01 DIAGNOSIS — Z23 Encounter for immunization: Secondary | ICD-10-CM | POA: Diagnosis not present

## 2023-12-10 ENCOUNTER — Ambulatory Visit: Payer: Medicare Other

## 2023-12-10 DIAGNOSIS — I482 Chronic atrial fibrillation, unspecified: Secondary | ICD-10-CM | POA: Diagnosis not present

## 2023-12-11 ENCOUNTER — Ambulatory Visit: Payer: Self-pay | Admitting: Cardiology

## 2023-12-11 LAB — CUP PACEART REMOTE DEVICE CHECK
Battery Remaining Longevity: 92 mo
Battery Remaining Percentage: 71 %
Battery Voltage: 3.01 V
Brady Statistic RV Percent Paced: 12 %
Date Time Interrogation Session: 20251203020014
Implantable Lead Connection Status: 753985
Implantable Lead Implant Date: 20080804
Implantable Lead Location: 753860
Implantable Pulse Generator Implant Date: 20210309
Lead Channel Impedance Value: 310 Ohm
Lead Channel Pacing Threshold Amplitude: 1.25 V
Lead Channel Pacing Threshold Pulse Width: 0.6 ms
Lead Channel Sensing Intrinsic Amplitude: 3.4 mV
Lead Channel Setting Pacing Amplitude: 2.5 V
Lead Channel Setting Pacing Pulse Width: 0.6 ms
Lead Channel Setting Sensing Sensitivity: 2 mV
Pulse Gen Model: 1272
Pulse Gen Serial Number: 9153964

## 2023-12-16 NOTE — Progress Notes (Signed)
 Remote PPM Transmission

## 2024-01-19 ENCOUNTER — Telehealth (HOSPITAL_COMMUNITY): Payer: Self-pay | Admitting: Cardiology

## 2024-01-19 NOTE — Telephone Encounter (Signed)
 Patients daughter cancelled  Echocardiogram due to patient is sick and she will callback to reschedule at a later date.  Order will be removed from the active echo WQ and when patient calls back we will reinstate the order. Thank you.

## 2024-01-21 ENCOUNTER — Ambulatory Visit (HOSPITAL_COMMUNITY)

## 2024-03-10 ENCOUNTER — Ambulatory Visit: Payer: Medicare Other

## 2024-06-09 ENCOUNTER — Ambulatory Visit: Payer: Medicare Other
